# Patient Record
Sex: Male | Born: 1955 | Race: White | Hispanic: No | Marital: Married | Smoking: Former smoker
Health system: Southern US, Community
[De-identification: ages and names within clinical notes are randomized; demographics above are authoritative.]

## PROBLEM LIST (undated history)

## (undated) DIAGNOSIS — F32A Depression, unspecified: Secondary | ICD-10-CM

## (undated) DIAGNOSIS — G8929 Other chronic pain: Secondary | ICD-10-CM

## (undated) DIAGNOSIS — J301 Allergic rhinitis due to pollen: Secondary | ICD-10-CM

## (undated) DIAGNOSIS — F329 Major depressive disorder, single episode, unspecified: Secondary | ICD-10-CM

## (undated) DIAGNOSIS — K219 Gastro-esophageal reflux disease without esophagitis: Secondary | ICD-10-CM

## (undated) DIAGNOSIS — E785 Hyperlipidemia, unspecified: Secondary | ICD-10-CM

## (undated) DIAGNOSIS — M549 Dorsalgia, unspecified: Secondary | ICD-10-CM

## (undated) DIAGNOSIS — E119 Type 2 diabetes mellitus without complications: Secondary | ICD-10-CM

## (undated) DIAGNOSIS — I10 Essential (primary) hypertension: Secondary | ICD-10-CM

## (undated) DIAGNOSIS — I219 Acute myocardial infarction, unspecified: Secondary | ICD-10-CM

## (undated) DIAGNOSIS — I2510 Atherosclerotic heart disease of native coronary artery without angina pectoris: Secondary | ICD-10-CM

## (undated) DIAGNOSIS — M5442 Lumbago with sciatica, left side: Secondary | ICD-10-CM

## (undated) DIAGNOSIS — F411 Generalized anxiety disorder: Secondary | ICD-10-CM

## (undated) DIAGNOSIS — M5441 Lumbago with sciatica, right side: Secondary | ICD-10-CM

## (undated) DIAGNOSIS — J3089 Other allergic rhinitis: Principal | ICD-10-CM

## (undated) DIAGNOSIS — R350 Frequency of micturition: Secondary | ICD-10-CM

## (undated) DIAGNOSIS — N401 Enlarged prostate with lower urinary tract symptoms: Secondary | ICD-10-CM

## (undated) DIAGNOSIS — I25118 Atherosclerotic heart disease of native coronary artery with other forms of angina pectoris: Secondary | ICD-10-CM

## (undated) DIAGNOSIS — J302 Other seasonal allergic rhinitis: Secondary | ICD-10-CM

## (undated) DIAGNOSIS — I25110 Atherosclerotic heart disease of native coronary artery with unstable angina pectoris: Principal | ICD-10-CM

## (undated) DIAGNOSIS — R062 Wheezing: Secondary | ICD-10-CM

## (undated) DIAGNOSIS — R972 Elevated prostate specific antigen [PSA]: Secondary | ICD-10-CM

## (undated) DIAGNOSIS — R110 Nausea: Secondary | ICD-10-CM

## (undated) DIAGNOSIS — Z419 Encounter for procedure for purposes other than remedying health state, unspecified: Secondary | ICD-10-CM

## (undated) DIAGNOSIS — L309 Dermatitis, unspecified: Secondary | ICD-10-CM

## (undated) DIAGNOSIS — Z1211 Encounter for screening for malignant neoplasm of colon: Secondary | ICD-10-CM

## (undated) DIAGNOSIS — I25708 Atherosclerosis of coronary artery bypass graft(s), unspecified, with other forms of angina pectoris: Secondary | ICD-10-CM

## (undated) DIAGNOSIS — Z860101 Personal history of adenomatous and serrated colon polyps: Secondary | ICD-10-CM

## (undated) DIAGNOSIS — M545 Low back pain, unspecified: Secondary | ICD-10-CM

## (undated) DIAGNOSIS — J069 Acute upper respiratory infection, unspecified: Principal | ICD-10-CM

## (undated) DIAGNOSIS — Z20822 Contact with and (suspected) exposure to covid-19: Secondary | ICD-10-CM

## (undated) HISTORY — DX: Hyperlipidemia, unspecified: E78.5

## (undated) HISTORY — DX: Acute myocardial infarction, unspecified: I21.9

## (undated) HISTORY — PX: OTHER SURGICAL HISTORY: SHX169

## (undated) HISTORY — DX: Major depressive disorder, single episode, unspecified: F32.9

## (undated) HISTORY — DX: Essential (primary) hypertension: I10

## (undated) HISTORY — DX: Atherosclerotic heart disease of native coronary artery without angina pectoris: I25.10

## (undated) HISTORY — DX: Type 2 diabetes mellitus without complications: E11.9

## (undated) HISTORY — DX: Gastro-esophageal reflux disease without esophagitis: K21.9

## (undated) HISTORY — DX: Dorsalgia, unspecified: M54.9

## (undated) HISTORY — DX: Depression, unspecified: F32.A

## (undated) HISTORY — DX: Allergic rhinitis due to pollen: J30.1

## (undated) HISTORY — DX: Other chronic pain: G89.29

---

## 2001-04-09 ENCOUNTER — Encounter: Payer: Self-pay | Admitting: Emergency Medicine

## 2001-04-09 ENCOUNTER — Inpatient Hospital Stay (HOSPITAL_COMMUNITY): Admission: EM | Admit: 2001-04-09 | Discharge: 2001-04-11 | Payer: Self-pay | Admitting: *Deleted

## 2002-02-15 ENCOUNTER — Ambulatory Visit (HOSPITAL_COMMUNITY): Admission: RE | Admit: 2002-02-15 | Discharge: 2002-02-15 | Payer: Self-pay | Admitting: Cardiology

## 2002-03-17 ENCOUNTER — Inpatient Hospital Stay (HOSPITAL_COMMUNITY): Admission: EM | Admit: 2002-03-17 | Discharge: 2002-03-26 | Payer: Self-pay | Admitting: Emergency Medicine

## 2002-03-17 ENCOUNTER — Encounter: Payer: Self-pay | Admitting: Emergency Medicine

## 2002-03-21 ENCOUNTER — Encounter: Payer: Self-pay | Admitting: Cardiology

## 2002-03-22 ENCOUNTER — Encounter: Payer: Self-pay | Admitting: Cardiothoracic Surgery

## 2002-03-22 HISTORY — PX: CORONARY ARTERY BYPASS GRAFT: SHX141

## 2002-03-23 ENCOUNTER — Encounter: Payer: Self-pay | Admitting: Cardiothoracic Surgery

## 2002-03-24 ENCOUNTER — Encounter: Payer: Self-pay | Admitting: Thoracic Surgery (Cardiothoracic Vascular Surgery)

## 2002-03-25 ENCOUNTER — Encounter: Payer: Self-pay | Admitting: Thoracic Surgery (Cardiothoracic Vascular Surgery)

## 2002-04-22 ENCOUNTER — Encounter (HOSPITAL_COMMUNITY): Admission: RE | Admit: 2002-04-22 | Discharge: 2002-07-21 | Payer: Self-pay | Admitting: Cardiology

## 2004-08-10 ENCOUNTER — Ambulatory Visit: Payer: Self-pay | Admitting: General Practice

## 2004-12-21 ENCOUNTER — Encounter (INDEPENDENT_AMBULATORY_CARE_PROVIDER_SITE_OTHER): Payer: Self-pay | Admitting: *Deleted

## 2004-12-21 ENCOUNTER — Ambulatory Visit (HOSPITAL_BASED_OUTPATIENT_CLINIC_OR_DEPARTMENT_OTHER): Admission: RE | Admit: 2004-12-21 | Discharge: 2004-12-21 | Payer: Self-pay | Admitting: Surgery

## 2004-12-21 ENCOUNTER — Ambulatory Visit (HOSPITAL_COMMUNITY): Admission: RE | Admit: 2004-12-21 | Discharge: 2004-12-21 | Payer: Self-pay | Admitting: Surgery

## 2004-12-21 HISTORY — PX: OTHER SURGICAL HISTORY: SHX169

## 2005-02-22 ENCOUNTER — Ambulatory Visit (HOSPITAL_BASED_OUTPATIENT_CLINIC_OR_DEPARTMENT_OTHER): Admission: RE | Admit: 2005-02-22 | Discharge: 2005-02-22 | Payer: Self-pay | Admitting: Surgery

## 2005-02-22 ENCOUNTER — Encounter (INDEPENDENT_AMBULATORY_CARE_PROVIDER_SITE_OTHER): Payer: Self-pay | Admitting: Specialist

## 2005-02-22 HISTORY — PX: OTHER SURGICAL HISTORY: SHX169

## 2006-03-13 ENCOUNTER — Ambulatory Visit: Payer: Self-pay | Admitting: Cardiovascular Disease

## 2006-03-15 ENCOUNTER — Ambulatory Visit: Payer: Self-pay

## 2006-03-16 ENCOUNTER — Ambulatory Visit: Payer: Self-pay | Admitting: Cardiovascular Disease

## 2006-03-29 ENCOUNTER — Ambulatory Visit: Payer: Self-pay | Admitting: Cardiovascular Disease

## 2006-03-29 ENCOUNTER — Inpatient Hospital Stay (HOSPITAL_COMMUNITY): Admission: AD | Admit: 2006-03-29 | Discharge: 2006-03-31 | Payer: Self-pay | Admitting: Cardiovascular Disease

## 2006-03-29 ENCOUNTER — Inpatient Hospital Stay (HOSPITAL_BASED_OUTPATIENT_CLINIC_OR_DEPARTMENT_OTHER): Admission: RE | Admit: 2006-03-29 | Discharge: 2006-03-29 | Payer: Self-pay | Admitting: Cardiovascular Disease

## 2006-04-06 ENCOUNTER — Ambulatory Visit: Payer: Self-pay | Admitting: Internal Medicine

## 2006-04-13 ENCOUNTER — Ambulatory Visit: Payer: Self-pay | Admitting: Cardiovascular Disease

## 2006-07-03 ENCOUNTER — Ambulatory Visit: Payer: Self-pay | Admitting: Cardiovascular Disease

## 2006-09-15 ENCOUNTER — Ambulatory Visit: Payer: Self-pay | Admitting: Cardiovascular Disease

## 2006-09-15 ENCOUNTER — Ambulatory Visit: Payer: Self-pay

## 2006-09-18 ENCOUNTER — Encounter: Admission: RE | Admit: 2006-09-18 | Discharge: 2006-09-18 | Payer: Self-pay | Admitting: Family Medicine

## 2006-09-26 ENCOUNTER — Encounter: Admission: RE | Admit: 2006-09-26 | Discharge: 2006-09-26 | Payer: Self-pay | Admitting: Family Medicine

## 2006-11-28 ENCOUNTER — Ambulatory Visit: Payer: Self-pay | Admitting: Cardiovascular Disease

## 2007-02-07 ENCOUNTER — Ambulatory Visit: Payer: Self-pay | Admitting: Cardiology

## 2007-02-07 LAB — CONVERTED CEMR LAB
Basophils Relative: 0.2 % (ref 0.0–1.0)
CO2: 30 meq/L (ref 19–32)
Creatinine, Ser: 1 mg/dL (ref 0.4–1.5)
Glucose, Bld: 132 mg/dL — ABNORMAL HIGH (ref 70–99)
HCT: 48.4 % (ref 39.0–52.0)
Hemoglobin: 16.7 g/dL (ref 13.0–17.0)
INR: 0.9 (ref 0.8–1.0)
MCHC: 34.6 g/dL (ref 30.0–36.0)
Monocytes Absolute: 0.5 10*3/uL (ref 0.2–0.7)
Neutrophils Relative %: 54.2 % (ref 43.0–77.0)
Potassium: 4.1 meq/L (ref 3.5–5.1)
Prothrombin Time: 11.7 s (ref 10.9–13.3)
RDW: 12.5 % (ref 11.5–14.6)
Sodium: 140 meq/L (ref 135–145)

## 2007-02-14 ENCOUNTER — Inpatient Hospital Stay (HOSPITAL_BASED_OUTPATIENT_CLINIC_OR_DEPARTMENT_OTHER): Admission: RE | Admit: 2007-02-14 | Discharge: 2007-02-14 | Payer: Self-pay | Admitting: Cardiology

## 2007-02-14 ENCOUNTER — Ambulatory Visit: Payer: Self-pay | Admitting: Cardiology

## 2007-02-23 ENCOUNTER — Ambulatory Visit: Payer: Self-pay

## 2007-02-27 ENCOUNTER — Ambulatory Visit: Payer: Self-pay | Admitting: Cardiovascular Disease

## 2007-06-02 ENCOUNTER — Encounter: Payer: Self-pay | Admitting: *Deleted

## 2007-06-02 DIAGNOSIS — I252 Old myocardial infarction: Secondary | ICD-10-CM

## 2007-06-02 DIAGNOSIS — Z9861 Coronary angioplasty status: Secondary | ICD-10-CM | POA: Insufficient documentation

## 2007-06-02 DIAGNOSIS — E785 Hyperlipidemia, unspecified: Secondary | ICD-10-CM | POA: Insufficient documentation

## 2007-06-02 DIAGNOSIS — J301 Allergic rhinitis due to pollen: Secondary | ICD-10-CM

## 2007-06-02 DIAGNOSIS — Z951 Presence of aortocoronary bypass graft: Secondary | ICD-10-CM

## 2007-06-02 DIAGNOSIS — M549 Dorsalgia, unspecified: Secondary | ICD-10-CM | POA: Insufficient documentation

## 2007-06-02 DIAGNOSIS — I10 Essential (primary) hypertension: Secondary | ICD-10-CM | POA: Insufficient documentation

## 2007-06-02 DIAGNOSIS — I2510 Atherosclerotic heart disease of native coronary artery without angina pectoris: Secondary | ICD-10-CM | POA: Insufficient documentation

## 2007-06-02 DIAGNOSIS — E119 Type 2 diabetes mellitus without complications: Secondary | ICD-10-CM | POA: Insufficient documentation

## 2007-06-02 DIAGNOSIS — K219 Gastro-esophageal reflux disease without esophagitis: Secondary | ICD-10-CM | POA: Insufficient documentation

## 2007-06-02 DIAGNOSIS — F329 Major depressive disorder, single episode, unspecified: Secondary | ICD-10-CM

## 2007-09-19 ENCOUNTER — Ambulatory Visit: Payer: Self-pay | Admitting: Cardiovascular Disease

## 2008-03-27 ENCOUNTER — Encounter: Payer: Self-pay | Admitting: Cardiovascular Disease

## 2008-03-27 ENCOUNTER — Ambulatory Visit: Payer: Self-pay | Admitting: Cardiovascular Disease

## 2008-07-25 ENCOUNTER — Ambulatory Visit: Payer: Self-pay | Admitting: Cardiovascular Disease

## 2008-10-11 ENCOUNTER — Inpatient Hospital Stay (HOSPITAL_COMMUNITY): Admission: EM | Admit: 2008-10-11 | Discharge: 2008-10-12 | Payer: Self-pay | Admitting: Emergency Medicine

## 2008-10-21 ENCOUNTER — Ambulatory Visit (HOSPITAL_COMMUNITY): Admission: RE | Admit: 2008-10-21 | Discharge: 2008-10-21 | Payer: Self-pay | Admitting: Orthopedic Surgery

## 2008-11-29 ENCOUNTER — Emergency Department (HOSPITAL_COMMUNITY): Admission: EM | Admit: 2008-11-29 | Discharge: 2008-11-30 | Payer: Self-pay | Admitting: Emergency Medicine

## 2008-11-29 ENCOUNTER — Encounter: Payer: Self-pay | Admitting: Cardiovascular Disease

## 2008-12-05 ENCOUNTER — Encounter (INDEPENDENT_AMBULATORY_CARE_PROVIDER_SITE_OTHER): Payer: Self-pay | Admitting: *Deleted

## 2009-05-21 ENCOUNTER — Ambulatory Visit: Payer: Self-pay | Admitting: Cardiovascular Disease

## 2009-12-02 ENCOUNTER — Encounter: Payer: Self-pay | Admitting: Cardiovascular Disease

## 2009-12-02 ENCOUNTER — Ambulatory Visit: Payer: Self-pay | Admitting: Cardiovascular Disease

## 2010-02-15 ENCOUNTER — Encounter: Payer: Self-pay | Admitting: Family Medicine

## 2010-02-23 NOTE — Assessment & Plan Note (Signed)
Summary: 6 month rov/sl   CC:  sob.  History of Present Illness: [name] is seen today in F/U for his CAD.  had coronary bypass surgery in 2004.  In March of 2008 catheterization revealed occlusion of all of his vein grafts with a patent LIMA to the LAD.  At that time he had stenting of the mid to distal native right coronary artery with a Taxus stent.  He had a followup catheter in January of 2009 as part of the Peruses trial bulimic continue to be patent as did the stent in the RCA with no obstructive disease.  His overall LV function is still normal.  He is now on Social Security disability.  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  I did not give him disability but he appears to have had a period he is enjoying his family time including playing with his grandchildren and nephews.  He even has taken  his motorcycle out for a ride or 2.  He is no llonger complaining of chest pain exertional dyspnea PND orthopnea palpitations or syncope.  He's been compliant with his medications.    He has one son in [place] that is not getting along with his mother.  Tyjai was to move to [place] to be with his son Mabel but may have to stay in [place] with Josh  Yahya's stress level is much reduced since his disability came through  Current Problems (verified): 1)  Percutaneous Transluminal Coronary Angioplasty, Hx of  (ICD-V45.82) 2)  Myocardial Infarction, Hx of  (ICD-412) 3)  Back Pain, Chronic  (ICD-724.5) 4)  Coronary Artery Bypass Graft, Hx of  (ICD-V45.81) 5)  Hyperlipidemia  (ICD-272.4) 6)  Hypertension  (ICD-401.9) 7)  Coronary Artery Disease  (ICD-414.00) 8)  Hay Fever  (ICD-477.0) 9)  Gerd  (ICD-530.81) 10)  Diabetes Mellitus  (ICD-250.00) 11)  Hx of Depression  (ICD-311)  Current Medications (verified): 1)  Aspirin 325 Mg  Tabs (Aspirin) .... Take Once Daily 2)  Tricor 145 Mg  Tabs (Fenofibrate) .... Take Once Daily 3)  Rhinocort Aqua 32 Mcg/act   Susp (Budesonide) .... Use Daily As Directed. 4)  Plavix 75 Mg  Tabs (Clopidogrel Bisulfate) .... Take Once Daily 5)  Metformin Hcl 500 Mg  Tabs (Metformin Hcl) .... 3 Tabs By Mouth Morning 6)  Vytorin 10-40 Mg  Tabs (Ezetimibe-Simvastatin) .... Take Once Daily 7)  Metoprolol Succinate 200 Mg Xr24h-Tab (Metoprolol Succinate) .... Take One Tablet By Mouth Daily 8)  Nexium 40 Mg  Cpdr (Esomeprazole Magnesium) .... Take Once Daily 9)  Clonazepam 1 Mg  Tabs (Clonazepam) .... Take 11/2 As Needed 10)  Nitroquick 0.4 Mg  Subl (Nitroglycerin) .... Take Sl For Chest Pain As Needed 11)  Percocet 5-325 Mg Tabs (Oxycodone-Acetaminophen) .[name].. 1 To 1 1/2 Tab By Mouth Three Times A Day 12)  Eye Drops .... As Directed  Allergies (verified): No Known Drug Allergies  Past History:  Past Medical History: Last updated: 06/02/2007 MYOCARDIAL INFARCTION, HX OF (ICD-412) BACK PAIN, CHRONIC (ICD-724.5) HYPERLIPIDEMIA (ICD-272.4) HYPERTENSION (ICD-401.9) CORONARY ARTERY DISEASE (ICD-414.00) HAY FEVER (ICD-477.0) GERD (ICD-530.81) DIABETES MELLITUS (ICD-250.00) Hx of DEPRESSION (ICD-311)    Past Surgical History: Last updated: 12/03/2007 CABG:  03/22/2002  Tyrone Sage PCI/Stent:  Juanda Chance 03/30/2006 to RCA all vein grafts occluded LIMA patent Lipoma Shoulder Skin Tag Neck   :   Tsuie  12/21/2004 Lipoma Arms  02/22/2005  Family History: Last updated: 12/03/2007 non-contributory  Social History: Last updated: 12/03/2007 Married with 4 chiildren Originally from [place] Former smoker Drinks Under a lot of stress in marriage Disabled Corporate investment banker  Review of Systems       Denies fever, malais, weight loss, blurry vision, decreased visual acuity, cough, sputum, SOB, hemoptysis, pleuritic pain, palpitaitons, heartburn, abdominal pain, melena, lower extremity edema, claudication, or rash.   Vital Signs:  Patient profile:   55 year old male Height:      87 inches Weight:      219  pounds BMI:     20.42 Pulse rate:   66 / minute BP sitting:   118 / 70  (left arm)  Vitals Entered By: Kem Parkinson (December 02, 2009 4:20 PM)  Physical Exam  General:  Affect appropriate Healthy:  appears stated age HEENT: normal Neck supple with no adenopathy JVP normal no bruits no thyromegaly Lungs clear with no wheezing and good diaphragmatic motion Heart:  S1/S2 no murmur,rub, gallop or click PMI normal Abdomen: benighn, BS positve, no tenderness, no AAA no bruit.  No HSM or HJR Distal pulses intact with no bruits No edema Neuro non-focal Skin warm and dry    Impression & Recommendations:  Problem # 1:  CORONARY ARTERY BYPASS GRAFT, HX OF (ICD-V45.81) Songle LIMA graft remaining with collaterals.  No angina Continue medical Rx.  Normal EF  Problem # 2:  HYPERLIPIDEMIA (ICD-272.4) At goal with no side effects.  Labs in 6 months His updated medication list for this problem includes:    Tricor 145 Mg Tabs (Fenofibrate) .[name]... Take once daily    Vytorin 10-40 Mg Tabs (Ezetimibe-simvastatin) .[name]... Take once daily  CHOL (goal): 200 (07/25/2008)   LDL (goal): 70 (07/25/2008)   HDL (goal): 40 (07/25/2008)   TG (goal): 150 (07/25/2008)  Problem # 3:  HYPERTENSION (ICD-401.9) Well controlled His updated medication list for this problem includes:    Aspirin 325 Mg Tabs (Aspirin) .[name]... Take once daily    Metoprolol Succinate 200 Mg Xr24h-tab (Metoprolol succinate) .[name]... Take one tablet by mouth daily  Other Orders: EKG w/ Interpretation (93000)  Patient Instructions: 1)  Your physician recommends that you schedule a follow-up appointment in: 6 MONTHS
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
## 2010-04-28 LAB — DIFFERENTIAL
Basophils Relative: 1 % (ref 0–1)
Monocytes Absolute: 0.5 10*3/uL (ref 0.1–1.0)
Monocytes Relative: 8 % (ref 3–12)
Neutro Abs: 4.4 10*3/uL (ref 1.7–7.7)

## 2010-04-28 LAB — POCT CARDIAC MARKERS
CKMB, poc: 1.2 ng/mL (ref 1.0–8.0)
CKMB, poc: 1.4 ng/mL (ref 1.0–8.0)
Myoglobin, poc: 72.1 ng/mL (ref 12–200)

## 2010-04-28 LAB — CBC
Hemoglobin: 13.9 g/dL (ref 13.0–17.0)
MCHC: 34.5 g/dL (ref 30.0–36.0)
MCV: 89.5 fL (ref 78.0–100.0)
RBC: 4.5 MIL/uL (ref 4.22–5.81)

## 2010-04-28 LAB — BASIC METABOLIC PANEL
CO2: 26 mEq/L (ref 19–32)
Calcium: 8.6 mg/dL (ref 8.4–10.5)
Chloride: 102 mEq/L (ref 96–112)
GFR calc Af Amer: >60 mL/min (ref >60)
Sodium: 135 mEq/L (ref 135–145)

## 2010-04-30 LAB — PROTIME-INR
Prothrombin Time: 12.2 seconds (ref 11.6–15.2)
Prothrombin Time: 13.3 seconds (ref 11.6–15.2)

## 2010-04-30 LAB — CBC
HCT: 40.9 % (ref 39.0–52.0)
HCT: 44.4 % (ref 39.0–52.0)
Hemoglobin: 14.1 g/dL (ref 13.0–17.0)
Hemoglobin: 15.2 g/dL (ref 13.0–17.0)
MCV: 93.1 fL (ref 78.0–100.0)
RBC: 4.39 MIL/uL (ref 4.22–5.81)
RBC: 4.73 MIL/uL (ref 4.22–5.81)
RDW: 12.3 % (ref 11.5–15.5)
WBC: 6.2 10*3/uL (ref 4.0–10.5)
WBC: 7.6 10*3/uL (ref 4.0–10.5)
WBC: 7.6 10*3/uL (ref 4.0–10.5)

## 2010-04-30 LAB — LACTIC ACID, PLASMA: Lactic Acid, Venous: 4.2 mmol/L — ABNORMAL HIGH (ref 0.5–2.2)

## 2010-04-30 LAB — DIFFERENTIAL
Basophils Absolute: 0 10*3/uL (ref 0.0–0.1)
Eosinophils Relative: 2 % (ref 0–5)
Lymphocytes Relative: 21 % (ref 12–46)
Lymphs Abs: 1.6 10*3/uL (ref 0.7–4.0)
Monocytes Absolute: 0.5 10*3/uL (ref 0.1–1.0)
Neutro Abs: 5.3 10*3/uL (ref 1.7–7.7)

## 2010-04-30 LAB — URINALYSIS, ROUTINE W REFLEX MICROSCOPIC
Bilirubin Urine: NEGATIVE
Glucose, UA: NEGATIVE mg/dL
Ketones, ur: NEGATIVE mg/dL
Nitrite: NEGATIVE
Nitrite: NEGATIVE
Protein, ur: NEGATIVE mg/dL
Protein, ur: NEGATIVE mg/dL
Specific Gravity, Urine: 1.012 (ref 1.005–1.030)
Urobilinogen, UA: 0.2 mg/dL (ref 0.0–1.0)
pH: 6 (ref 5.0–8.0)

## 2010-04-30 LAB — TYPE AND SCREEN: Antibody Screen: NEGATIVE

## 2010-04-30 LAB — BASIC METABOLIC PANEL
BUN: 9 mg/dL (ref 6–23)
Calcium: 9.7 mg/dL (ref 8.4–10.5)
Chloride: 105 mEq/L (ref 96–112)
GFR calc Af Amer: >60 mL/min (ref >60)
GFR calc non Af Amer: >60 mL/min (ref >60)
GFR calc non Af Amer: >60 mL/min (ref >60)
Potassium: 3.9 mEq/L (ref 3.5–5.1)
Potassium: 4.2 mEq/L (ref 3.5–5.1)
Sodium: 137 mEq/L (ref 135–145)
Sodium: 137 mEq/L (ref 135–145)

## 2010-04-30 LAB — GLUCOSE, CAPILLARY
Glucose-Capillary: 123 mg/dL — ABNORMAL HIGH (ref 70–99)
Glucose-Capillary: 127 mg/dL — ABNORMAL HIGH (ref 70–99)
Glucose-Capillary: 161 mg/dL — ABNORMAL HIGH (ref 70–99)

## 2010-04-30 LAB — POCT CARDIAC MARKERS: Myoglobin, poc: 244 ng/mL (ref 12–200)

## 2010-04-30 LAB — POCT I-STAT, CHEM 8
Hemoglobin: 16 g/dL (ref 13.0–17.0)
Sodium: 138 mEq/L (ref 135–145)
TCO2: 21 mmol/L (ref 0–100)

## 2010-04-30 LAB — ABO/RH: ABO/RH(D): B POS

## 2010-04-30 LAB — APTT
aPTT: 21 seconds — ABNORMAL LOW (ref 24–37)
aPTT: 22 seconds — ABNORMAL LOW (ref 24–37)

## 2010-04-30 LAB — RAPID URINE DRUG SCREEN, HOSP PERFORMED
Amphetamines: NOT DETECTED
Benzodiazepines: NOT DETECTED
Opiates: NOT DETECTED
Tetrahydrocannabinol: NOT DETECTED

## 2010-06-08 NOTE — Assessment & Plan Note (Signed)
[facility]                            CARDIOLOGY OFFICE NOTE   Donald, Howard                        MRN:          [id number]  DATE:09/19/2007                            DOB:          10/04/1955    [name] returns today for followup.   He has significant coronary artery disease with previous CABG.  He has  chronic chest pain syndrome.  His last cath was done on February 14, 2007.  At that time, the LIMA to the LAD was patent, he had 50%  narrowing in the distal LAD, vein graft to the diagonal branch was  occluded, vein graft to the circ was occluded, and the vein graft to the  right coronary artery was occluded.  He has a Taxus stent in the native  right coronary artery and the circumflex coronary artery only had 40%  ostial and mid vessel disease.   His LV function is normal.   He was initially enrolled in the PERSEUS study and had his followup cath  in January.   Dr. Juanda Chance felt his disease was nonobstructed and stable at this point.   In talking to [name], he continues to have issues with his mood.  He had  seen a psychologist previously for impulsive rates type disorder and  depression.  He is currently on Prozac.   He gets intermittent pain.  He is taken nitro maybe twice in the last  month.   Pain is a bit atypical.  He has not had any resting pain.  He is able to  ambulate.  He has actually lost about 8 pounds.  He is no longer  smoking.   He gets occasional dyspnea and cough, but no sputum production and no  fever.   He and his wife seem to be getting along okay.  He had to go to [place]  recently as his son had appendicitis.   Otherwise, he is not working and is on disability for his back and mood  disorder.   ALLERGIES:  He has no known allergies.   CURRENT MEDICATIONS:  1. Aspirin a day.  2. Tricor 145 a day.  3. Rhinocort.  4. Plavix 75 a day.  5. Metformin 500 b.i.d.  6. Vytorin 10/40.  7. Metoprolol 50 b.i.d.  8.  Nexium.   PHYSICAL EXAMINATION:  GENERAL:  Remarkable for a overweight Jovial [place] in no distress.  VITAL SIGNS:  His weight is down; however, from 224 to 216, blood  pressure 140/80, pulse 67 and regular, respiratory rate 14, afebrile.  HEENT:  Unremarkable.  NECK:  Carotids normal without bruit.  No lymphadenopathy, thyromegaly,  or JVP elevation.  LUNGS:  Clear with good diaphragmatic motion.  No wheezing.  HEART:  S1 and S2.  Normal heart sounds.  PMI normal.  ABDOMEN:  Benign.  Bowel sounds positive.  No AAA.  No tenderness.  No  bruit.  No hepatosplenomegaly or hepatojugular reflux.  EXTREMITIES:  Distal pulses intact.  No edema.  NEURO:  Nonfocal.  SKIN:  Warm and dry.  MUSCULOSKELETAL:  No muscular weakness.  EKG is essentially normal with an incomplete right bundle branch block.   IMPRESSION:  1. Coronary artery disease.  Coronary artery bypass graft with vein      grafts occluded, nonobstructive disease by cath in January 2009.      Continue aspirin and beta-blocker, as well as Plavix since he has      stent in the native right.  2. Hypertension currently well controlled.  Continue current dose of      metoprolol, will likely need angiotensin-converting enzyme      inhibitor added in the future.  3. History of reflux.  Continue Nexium in attempts at weight loss.  4. Hyperlipidemia, in the setting of coronary artery disease and      failed grafts.  Continue Tricor and Vytorin.  Lipid and liver      profile in 6 months.  5. Anxiety and mood disorder.  Follow up with psychologist as needed.      Continue Prozac.   I will see him back in 6 months.     [name], MD, [facility]  Electronically Signed    PCN/MedQ  DD: 09/19/2007  DT: 09/19/2007  Job #: [id number]

## 2010-06-08 NOTE — Assessment & Plan Note (Signed)
[facility]                            CARDIOLOGY OFFICE NOTE   TOR, TSUDA                        MRN:          [id number]  DATE:09/15/2006                            DOB:          12-27-1955    [name] returns today for followup. He has missed a few appointments. He  continues to have problems with his ex-wife in [place]. Today, he is in  quite a bit of pain. He seemed to imply that he had a lipoma on the left  hip area that was causing this however. I suspect that he has more of an  intrinsic back problem. He has had severe pain over the last 2-3 weeks.  He apparently saw a doctor in [place] who scheduled him to have an MRI  of his back. The patient has had multiple lipomas in the past. They have  been excised from his back and from his right upper arm. He has a lipoma  in the lower hip area, but I assured him that this was not the cause of  pain. He says he missed his last appointment because he had significant  gout. He took colchicine and had some diarrhea with it. The gout lasted  about three weeks and is now back and then resolved spontaneously. He is  not on allopurinol. The patient has significant coronary disease. He is  status post CABG with vein graft secluded in the patent LIMA. He had a  stent to the native distal right last year.   He had a Myoview study today which was totally normal with an EF of 66%.  However, [name] continues to have pain. He has taken nitro three times  this week. The pain is somewhat atypical. It is not exertional. It is  left-sided. It is sharp. It may be related to his recent lumbar strain.   In either case, I had a long discussion with the [name]. In the past he  has pressed me for disability and I told him that I did not think he was  a candidate since he has normal LV function and a nonischemic Myoview.  He continues to have a poor social situation.   In regards to his back, he does not recall any  strain. However, he  clearly has limited motion with flexion and extension and is walking  with a bit of a limp. There is no obvious sciatic type symptoms. He has  been having Vicodin prescribed to him by his primary MD and this has had  limited success. I suspect that [name] has some chronic problems in  regards to codeine and narcotic use.   REVIEW OF SYSTEMS:  Is otherwise negative.   PHYSICAL EXAMINATION:  Is remarkable for a chronically ill-appearing  middle-aged white male in some distress. He has multiple tattoos. His  blood pressure is 124/88, pulse 70 and regular. He is afebrile.  Respiratory rate is 16. Weight is 225 pounds.  HEENT: Is normal. Carotids are normal without bruit. There is no  thyromegaly. No lymphadenopathy. No JVP elevation.  NECK: Supple. There is no pain  to rotation, extension or flexion.  LUNGS:  Are clear with good diaphragmatic motion.  No wheezing.  Sternum is well-healed. There is an S1, S2 with normal heart sounds. PMI  is normal.  ABDOMEN: Is protuberant. Bowel sounds are positive. No AAA. No  hepatosplenomegaly. No hepatojugular reflux. No tenderness and no  bruits.  LOWER EXTREMITIES: Distal pulses are intact.  No edema.  NEURO: Is nonfocal. There is no lower extremity weakness on muscular  examination.  SKIN: Is warm and dry.  He has some stiffness in his back with decreased flexion and extension.  He has hyperesthesia over the left hip area with a lipoma. He has  previous lipoma scar removals in the right upper arm.   His EKG at baseline shows sinus rhythm and is normal with an incomplete  right bundle branch block.   IMPRESSION:  1. Coronary artery disease, previous bypass surgery with occluded      grafts, patent LIMA by cath in March of 2008, stent to the distal      right coronary artery and nonischemic Myoview. Continued chest      pain. Medical therapy only.  2. Hypertension, currently well-controlled. Continue beta-blocker.  3.  Hyperlipidemia. Continue Vytorin 10/40. Followup lipid and liver      profile in six months.  4. Lower lumbago pain. Followup MRI. Continue Vicodin p.r.n. Again, I      doubt there is going to be a disc problem here and he may benefit      from Flexeril or another muscle relaxant.  5. History of lipomas. I do not think the one on his left hip is      serious. After his back spasms have been relieved, it may be      reasonable to have him see a dermatologist for local excision.  6. Hypertriglyceridemia. Continue Tricor 145 a day and diet therapy as      his caloric intake is too high and he eats poorly.  7. Gout. Primarily in the right toe, resolved. Check a uric acid and      see if he needs to be on maintenance allopurinol. It does not      appear that he likes colchicine as it gave him significant      diarrhea.   Overall, [name]'s heart seems to be quite stable, but he seems to have  some other medical issues that need closer followup.     [name]. Eden Emms, MD, [facility]  Electronically Signed    PCN/MedQ  DD: 09/15/2006  DT: 09/16/2006  Job #: [id number]

## 2010-06-08 NOTE — Assessment & Plan Note (Signed)
[facility]                                 ON-CALL NOTE   Donald, Howard                        MRN:          [id number]  DATE:02/12/2007                            DOB:          08-28-1955    SUPERVISING PHYSICIAN:  Rollene Rotunda, MD   PRIMARY CARDIOLOGIST:  [name], MD   HISTORY:  [name] is a 55 year old male who calls on the evening of  the 19th.  At approximately 1909  [name] states that he received a  message from the office, and asked him to call back.  However, when he  calls that number; he states that the office was closed.  He states that  there was not a message on his machine as to what the reason was why he  was supposed to call the office back.  He does elaborate that he is  scheduled for an outpatient cardiac catheterization and is supposed to  be at the hospital at [facility] tomorrow.   I explained to [name] that I did not know why the office called him  and left a message for him to call back the office.  I did look up his  labs via E-chart that were drawn on the 14th, prior to his cardiac  catheterization for tomorrow.  There did not appear to be any  abnormalities that might postpone his catheterization.  I asked him to  continue as instructed for his outpatient catheterization tomorrow.  If  I hear of anything in the interim as to why the office left a message, I  would call him back and let him know.  He was agreeable with this plan.      Joellyn Rued, PA-C  Electronically Signed      Rollene Rotunda, MD, [facility]  Electronically Signed   EW/MedQ  DD: 02/12/2007  DT: 02/13/2007  Job #: [id number]

## 2010-06-08 NOTE — Letter (Signed)
February 07, 2007    Dierdre Forth  Magistrate [facility]  [place], [place]   RE:  [name], [name]  MRN:  [id number]  /  DOB:  November 23, 1955   Dear Ms. Hickey:   I am writing in regard to [name], who is scheduled to be in your  court or via teleconference on February 14, 2007.  [name] has a long  history of coronary artery disease and has ongoing problems related to  this.  He recently had a stent placed in one of his coronary arteries as  part of the PERSEUS study and has a 32-month follow-up catheterization  scheduled for February 13, 2007.  The patient continues to have chest  pain and because of this scheduled cardiac catheterization, he will be  unable to participate in family court on the above-stated date and will  need to be rescheduled.   Thank you in advance for understanding.    Sincerely,      [name], MD, [facility]  Electronically Signed    PCN/MedQ  DD: 02/07/2007  DT: 02/07/2007  Job #: [id number]

## 2010-06-08 NOTE — Cardiovascular Report (Signed)
NAMEYOSHIMI, SARR NO.:  [id number]   MEDICAL RECORD NO.:  [id number]          PATIENT TYPE:  OIB   LOCATION:  1963                         FACILITY:  [facility]   PHYSICIAN:  Everardo Beals. Juanda Chance, MD, FACCDATE OF BIRTH:  [birth year]   DATE OF PROCEDURE:  02/14/2007  DATE OF DISCHARGE:                            CARDIAC CATHETERIZATION   CLINICAL HISTORY:  Mr. Stoiber is 55 years old and had bypass surgery in  2004.  In March of 2008, he underwent catheterization and was found to  have occlusion of all his vein grafts with a patent LIMA to LAD.  He  underwent stenting of the mid to distal right coronary artery with a  Taxus element and part of the PERUSES trial.  He returns now for a  follow-up catheterization as part of the protocol.  He has had continued  chest pain, but it has not been typical for angina.   PROCEDURE:  By the right femoral artery, an arterial sheath and 4-French  coronary catheters.  A front wall arterial puncture in the form of  Omnipaque contrast was used.  The patient tolerated the procedure well  and left the laboratory in satisfactory condition.  We used a right  saphenous vein bypass graft catheter for injection of the vein graft to  the right coronary artery.   RESULTS:  1. Left main coronary artery:  The left main coronary was free of      disease.  2. Left anterior descending coronary artery:  The left anterior      descending artery gave rise to a diagonal branch and 2 septal      perforators and then there was competing flow distally.  There is      an 8 cm are in the proximal LAD.  3. Circumflex coronary artery:  The circumflex coronary artery gave      rise to a marginal branch, an atrial branch, second marginal      branch, and 2 posterolateral branches.  There is 40% ostial      stenosis in the circumflex artery.  There is 50% narrowing in the      mid vessel.  4. Right coronary artery:  The right coronary is a moderate-sized  vessel that gave rise to a conus branch, 2 right ventricular      branches, and a small and large posterior descending branch.  There      was 70% narrowing in the mid vessel.  There was 20% narrowing      within the Taxus stent in the mid to distal vessel.  No      irregularities in the distal vessel.  5. The saphenous vein graft to the diagonal branch of LAD was      completely occluded.  6. The saphenous vein graft to the circumflex artery was completely      occluded.  7. The saphenous vein graft to the right coronary artery was      completely occluded.  8. The LIMA graft to the LAD was patent.  There is 50% narrowing in  the distal LAD.  9. Left ventriculogram:  The left ventriculogram was performed in the      RAO projection and showed good wall motion with no areas of      hypokinesis.  The estimated ejection fraction was 60%.   The aortic pressure was 110/81 with a mean of 94 and left ventricular  pressure was 110/80.   CONCLUSION:  1. Follow-up catheterization as part of the PERUSES study.  2. Coronary artery disease, status post coronary bypass graft surgery      in 2004.  3. Severe native vessel disease with 8 cm in the proximal left      anterior descending coronary artery, 40% ostial and 50% mid      stenosis in the circumflex artery, and 70% mid stenosis in the      right coronary with 20% focal narrowing within the stent in the mid      to distal right coronary artery.  4. Patent left internal mammary artery graft to the left anterior      descending coronary artery with 50% narrowing in the left anterior      descending coronary artery distal to the insertion site, occluded      vein graft to the diagonal branch of the left anterior descending      coronary artery, occluded vein graft to the circumflex artery, and      occluded vein graft to the right coronary artery.  5. Normal left ventricular function.   RECOMMENDATIONS:  Although the patient has 3  occluded vein grafts, his  disease is nonobstructive at this time.  The Taxus stent in the mid to  distal right coronary artery is patent with 20% focal narrowing.  His  native circulation has only nonobstructive disease in and his left  internal mammary artery graft to the left anterior descending coronary  artery is working well.  Will plan continued secondary risk factor  modification and follow-up with Dr. Eden Emms in a few weeks.      Bruce Elvera Lennox Juanda Chance, MD, [facility]  Electronically Signed     BRB/MEDQ  D:  02/14/2007  T:  02/14/2007  Job:  [id number]   cc:   [name], MD, [facility]  [facility]

## 2010-06-08 NOTE — Assessment & Plan Note (Signed)
[facility]                            CARDIOLOGY OFFICE NOTE   ROC, STREETT                        MRN:          [id number]  DATE:11/28/2006                            DOB:          07-Feb-1955    The patient returns today for follow-up.  He has had previous bypass in  2004 with failed vein grafts and patent LIMA.  He has had a stent to the  native right this year.  He has had occasional chest pain.  He took  nitroglycerin about two weeks ago.  It was fairly self-limited.  His  activity is more limited by chronic back problems.  Apparently, he has a  bulging disk at L4-5.  He was being seen by an orthopedic doctor.  They  do not want to give him injections due to his Plavix.  I explained to  the patient that I did not want his platelets stopped without anyone  talking to [place] first.  His risk factors are being well modified.  He is  compliant with his medicines.  In general, he has been doing fairly well  and does not need any further diagnostic testing.   REVIEW OF SYSTEMS:  His gout has improved.  Again, his biggest problem  is his lower back pain.  This is keeping him from working.  He primarily  sits around the house all day and occasionally walks his dog, Brooke Dare.  Review of systems is otherwise negative.   MEDICATIONS:  1. Aspirin a day.  2. Plavix 75 mg a day.  3. Clonazepam 1.5 mg p.r.n.  4. TriCor 145 mg a day.  5. Rhinocort.  6. Metformin 500 mg b.i.d.  7. Prilosec 20 mg a day.  8. Vytorin 10/40.  9. Metoprolol 50 mg b.i.d.   PHYSICAL EXAMINATION:  GENERAL:  An overweight white male in no  distress.  He has multiple tattoos.  VITAL SIGNS:  Blood pressure 140/80, pulse 80 and regular, weight 222,  respiratory rate 14.  HEENT:  Unremarkable.  NECK:  Supple.  No lymphadenopathy, no thyromegaly, no JVP elevation,  and no bruits.  LUNGS:  Clear with diaphragmatic motion.  No wheezing.  HEART:  S1 and S2 with distant heart sounds.   PMI is not palpable.  ABDOMEN:  Benign.  Bowel sounds are positive.  No tenderness, no  hepatosplenomegaly, no hepatojugular reflux.  No AAA and no bruits.  EXTREMITIES:  Distal pulses are intact with no edema.  NEUROLOGY:  Nonfocal.  There is no muscular weakness.  He does have  decreased extension and flexion at the back with some stiffness.   IMPRESSION:  1. Coronary artery disease, vein grafts occluded with patent left      internal mammary artery and recent stent to the right coronary      artery native.  Continue aspirin and Plavix.  2. Hypertension currently borderline controlled.  I suspect part of      this has to do with the patient's weight gain and dietary      indiscretion.  For the time being continue metoprolol 50 mg  b.i.d.      The patient is a diabetic and I suspect ought to be on an ACE      inhibitor at some point in the future to help protect his kidneys.  3. Hypercholesterolemia in the setting of coronary disease.  Continue      Vytorin as well as TriCor with low fat diet.  4. Multiple other chronic medical problems including back pain.      Follow up with orthopedics for this.  Avoid stopping Plavix if at      all possible.  I will see the patient back in the spring.  He will      need a follow-up stress test in about a year.     [name], MD, [facility]  Electronically Signed    PCN/MedQ  DD: 11/28/2006  DT: 11/28/2006  Job #: [id number]

## 2010-06-08 NOTE — Assessment & Plan Note (Signed)
[facility]                            CARDIOLOGY OFFICE NOTE   FUAD, FORGET                        MRN:          [id number]  DATE:02/27/2007                            DOB:          [birth year]    Antwone returns today for followup.  He has a previous CABG with failed  vein graft.  He has a patent LIMA to the LAD and a stent to the native  right.   He just had a repeat heart cath by Dr. Juanda Chance on the 20th, as part of  the Perseus trial.  His stent was patent.  He was well revascularized,  despite having occluded vein grafts.  He tolerated the procedure well.   The patient can use to have noncardiac chest pain.  I suggested that he  arrange with Dr. Lewie Chamber to see a pain clinic.   He does have some chronic back problems.   In regards to his heart, he is currently stable has been compliant with  his meds.  Risk factors were well modified.   REVIEW OF SYSTEMS:  Remarkable for some tenderness in arthritic-type  pain in his right hand, as well as chronic lower back pain.  Otherwise  negative.   MEDICATIONS:  Include:  1. Aspirin a day.  2. Tricor 145 a day.  3. Rhinocort.  4. Plavix 75 a day.  5. Metformin 500 b.i.d.  6. Vytorin 10/40.  7. Metoprolol 50 b.i.d.  8. Nexium 40 a day.   PHYSICAL EXAMINATION:  GENERAL:  His exam is remarkable for a healthy-  appearing middle-aged white male.  Affect is appropriate.  VITAL SIGNS:  Weight is 224, blood pressure is 140/80, pulse 80 and  regular, afebrile, respiratory rate 14.  HEENT:  Unremarkable.  Carotids are without bruit, no lymphadenopathy,  thyromegaly, JVP elevation.  LUNGS:  Clear diaphragmatic motion.  No wheezing.  S1-S2 normal heart  sounds.  PMI normal.  ABDOMEN:  Benign.  Bowel sounds positive.  No AAA, no tenderness, no  hepatosplenomegaly, no hepatojugular reflux, distal pulses intact, no  edema.  NEURO:  Nonfocal.  SKIN:  Warm and dry.  He has multiple tattoos on his arms and  neck.  Distal pulses are intact.  NEURO:  Nonfocal.  EKG shows incomplete right bundle branch block, has  been essentially normal.   IMPRESSION:  1. Previous coronary artery bypass graft with failed vein grafts,      recent cath showing fairly good revascularization despite this.      Continue aspirin and Plavix and beta blocker and follow-up Myoview      in the year.  2. Noncardiac pain.  Consider follow-up and pain clinic, rule out      systemic connective tissue disease, continue nonsteroidal.  Try to      avoid narcotics.  3. Hypertension, currently well controlled.  Continue current dose of      metoprolol.  4. Hypercholesterolemia in the setting of coronary disease.  Continue      Vytorin 1040, lipid liver profile in 6 months.  5. History of reflux.  Continue Nexium  40 a day.  Low-spice diet.      Avoid late-night meals.   Again, Bodhi continues to be under a lot of stress.  He had to miss a  family court appointment in [place], [place], due to his heart cath.  I believe he is collecting social security disability now.  I will see  him back in 6 months.     [name], MD, [facility]  Electronically Signed    PCN/MedQ  DD: 02/27/2007  DT: 02/28/2007  Job #: [id number]

## 2010-06-08 NOTE — H&P (Signed)
[facility] ADMISSION   [name], [name]                        MRN:          [id number]  DATE:02/07/2007                            DOB:          1955-04-08    The patient seen in the [facility] clinic on February 07, 2007 as part of  the small-vessel study. The patient is due for a 58-month followup  catheterization, and this is scheduled for February 13, 2007.   CHIEF COMPLAINT:  Chest pain.   HISTORY OF PRESENT ILLNESS:  [name] is a 55 year old African-American male  patient of Dr. Eden Emms who has history of coronary artery disease. He was  status post CABG in 2004, and subsequently catheterization on March 29, 2006 revealed ejection fraction of 60% with native RCA, had a 50% mid,  70-80% distal, and all of his vein grafts were occluded as follows:  Vein graft to the OM, vein graft to the first diagonal, vein graft to  the RCA. The LIMA to the LAD was patent. He underwent Taxus stenting to  the distal native RCA by Dr. Juanda Chance on March 30, 2006 and had residual  stenosis in the proximal RCA less than 50%.   Since then, the patient continues to have chest pain on a regular basis.  He uses nitroglycerin at least once a week. The chest pain is described  as a heaviness associated with shortness of breath, occurs whenever he  does any exertion such as raking the leaves or walking. Last week, it  took 3 nitroglycerin to relieve his chest pain. He is due for a 60-month  followup study on February 13, 2007 as part of the study. The patient is  scheduled to go to family court in [place], [place], on February 14, 2007, but this will have to be rescheduled as the catheterization  has to be done on this date because it is part of the study.   ALLERGIES:  No known drug allergies.   CURRENT MEDICATIONS:  1. Aspirin 325 mg daily.  2. TriCor 145 mg daily.  3. Rhinocort daily.  4. Plavix 75 mg daily.  5. Metformin 500  mg b.i.d.  6. Vytorin 10/40 mg daily.  7. Metoprolol 50 mg b.i.d.  8. Nexium 40 mg daily.   PAST MEDICAL HISTORY:  Significant for:  1. 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  2. Coronary artery disease as listed above.  3. Diabetes mellitus.  4. Hypercholesterolemia.  5. Gout.  6. History of lipomas.  7. Hypertension.   SOCIAL HISTORY:  He is married. He has four children. He quit smoking.  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   FAMILY HISTORY:  Significant for coronary artery disease.   REVIEW OF SYSTEMS:  Is negative for dizziness or presyncopal signs or  symptoms, dyspepsia, dysphagia, nausea, vomiting, change in bowels or  melena. He does have chronic back pain from his bulging disks.  CARDIOPULMONARY:  Please see HPI.   PHYSICAL EXAMINATION:  This is  a pleasant 55 year old African-American  male in no acute distress. Blood pressure 128/82, pulse 80, weight 219.  HEENT:  Head is normocephalic without sign of trauma.  Extraocular.  Movements are intact. Pupils are equal and reactive to light and  accommodation. Nasal mucosa is moist. Throat is without erythema or  educate.  NECK:  Is without JVD, HR, bruit or thyroid enlargement.  LUNGS:  Clear anterior, posterior and lateral.  HEART:  Regular rate and rhythm at 80 beats per minute. Normal S1 and  S2. No murmur, rub or atrial heave noted.  ABDOMEN:  Is soft without organomegaly, masses, lesions or abnormal  tenderness.  EXTREMITIES:  Without clubbing, cyanosis, or edema. He has good distal  pulses.  NEUROLOGICAL EXAM:  Is without focal deficit.   EKG:  Normal sinus rhythm with incomplete right bundle branch block.   IMPRESSION:  1. Coronary artery disease - part of a small-vessel study, for 34-month      followup catheterization on February 13, 2007.  2. Coronary artery disease status post coronary artery bypass grafting      in 2004; three or four  grafts occluded; LIMA to the LAD patent.  3. Status post Taxus stenting to the distal native right coronary      artery on March 30, 2006 with residual stenosis in the proximal      right coronary artery slightly less than 50%, ejection fraction      60%.  4. Hypertension.  5. Hyperlipidemia with hypertriglyceridemia.  6. Diabetes mellitus.  7. Exsmoker.  8. Family history of coronary artery disease.  9. Prior myocardial infarction, treated with percutaneous coronary      intervention in 2003.  10.Chronic back pain secondary to bulging disks.   PLAN:  At this time, we will admit this patient for repeat cardiac  catheterization on February 13, 2007 as part of the PERSEUS study, and he  will follow up with Dr. Eden Emms after that.      [name], PA-C  Electronically Signed      [name], MD, [facility]  Electronically Signed   ML/MedQ  DD: 02/07/2007  DT: 02/07/2007  Job #: [id number]

## 2010-06-08 NOTE — Assessment & Plan Note (Signed)
[facility]                            CARDIOLOGY OFFICE NOTE   [name], [name]                        MRN:          [id number]  DATE:07/03/2006                            DOB:          1955/02/05    [name] returns today for follow-up.  I believe he missed his last  appointment.  He continues to be under a lot of stress.  His ex-wife  lives in [place] and he has been in contact with his 56 year old son.  He continues to be unemployed.  [name] just always seems to be very  stressed out.   The patient is status post previous bypass.  Unfortunately his vein  grafts are all occluded.  His LIMA is patent.  He has had recent  stenting of the distal right coronary artery.   He continues to have chest pain, some of it is atypical, however, he  does take nitroglycerin for it.  I explained to [name] that I thought we  would continue to treat him medically since his interventional options  are limited.   I also talked to [name] about disability.  Currently, I do not think he  is a candidate.  He is not having incapacitating chest pain.  His native  right was stented and his current revascularization is adequate. He has  no LV dysfunction and is functional class I.   REVIEW OF SYSTEMS:  Remarkable for probable bilateral carpal tunnel  syndrome.  He needs median nerve testing.  Review of systems otherwise  negative.   MEDICATIONS:  1. Aspirin a day.  2. Nexium.  3. Clonazepam 1-1/2 p.r.n.  4. Lopressor 50 b.i.d.  5. TriCor 150 mg a day.  6. Rhinocort.  7. Plavix 75 a day.  8. Metformin 500 b.i.d.   PHYSICAL EXAMINATION:  GENERAL:  A somewhat stressed out, overweight,  middle-aged male.  Mood is agitated.  VITAL SIGNS:  Remarkable for blood pressure 154/88, pulse 60 and  regular, weight 220, respirations 16.  HEENT:  Normal.  NECK:  Supple.  There is no JVP elevation.  No thyromegaly, no bruits.  LUNGS:  Clear without wheezing and normal  diaphragmatic motion.  HEART:  There is an S1 and S2 with normal heart sounds.  PMI is normal.  He is status post sternotomy.  ABDOMEN:  Benign.  There is no epigastric pain.  Bowel sounds are  positive.  No organomegaly, no hepatosplenomegaly, no hepatojugular  reflux.  EXTREMITIES:  Femorals were +2 and deep bilaterally.  There is no  bruits.  Distal pulses intact with no edema.  NEUROLOGY:  Nonfocal.  SKIN:  Warm and dry with multiple tattoos.  There is no muscular  weakness.   IMPRESSION:  1. Coronary artery disease, previous coronary artery bypass graft with      failed vein grafts, stenting of the native right coronary artery      and patent left internal mammary artery, good left ventricular      function.  Chest pain possibly angina, we are going to continue      medical therapy.  Follow  up on Myoview probably in 6 months.  2. Hypercholesterolemia.  Continue TriCor.  I am not sure why he is      not on a statin drug.  We will have to review the records on this.  3. Gastroesophageal reflux disease.  Continue Nexium 40 mg a day.  4. Diabetes.  Follow up with primary care doctor, check hemoglobin      A1C, continue Metformin 500 b.i.d.     [name]. Donald Emms, MD, [facility]  Electronically Signed    PCN/MedQ  DD: 07/03/2006  DT: 07/03/2006  Job #: [id number]

## 2010-06-11 NOTE — Assessment & Plan Note (Signed)
[facility]                                 ON-CALL NOTE   LANGDON, CROSSON                          MRN:          [id number]  DATE:07/08/2006                            DOB:          05/26/1955    TIME OF CALL:  11am   TELEPHONE NUMBER:  [phone]   OBJECTIVE:  The patient has gout in the foot. Wanted a prescription for  gout medicine and was told to come in to be seen, so I can see what it  looks like. The patient's wife has to go to work at [employer] and the patient  said that he cannot go. If he gets worse, he will go to the emergency  room.   ASSESSMENT:  Presumed gout with foot pain.   PLAN:  To the ER if need be, otherwise come in next week if it is still  bothering him.     Arta Silence, MD  Electronically Signed    RNS/MedQ  DD: 07/08/2006  DT: 07/09/2006  Job #: [id number]

## 2010-06-11 NOTE — Assessment & Plan Note (Signed)
[facility]                           PRIMARY CARE OFFICE NOTE   CHRISS, MANNAN                        MRN:          [id number]  DATE:04/06/2006                            DOB:          1955/12/24    REFERRING PHYSICIAN:  [name], MD, [facility]   [name] is a 55 year old gentleman, who presents to establish for  ongoing continuity care, referred through the courtesy of Dr. Charlton Haws.   CHIEF COMPLAINT:  Patient with newly-diagnosed diabetes, poorly-  controlled.   PAST MEDICAL HISTORY:  SURGICAL:  1. Patient with MI in March of 2004 with cath and stent at another      location.  2. Coronary artery bypass grafting times six vessels with a vein graft      to the obtuse marginal, first diagonal, RCA with LIMA to LAD.  3. Complex stenting, March 5 of 2008, when  the patient had chest      pain, abnormal blood pressure response to stress testing, and came      to cardiac catheterization, which revealed that patient had three      out of four grafts occluded and underwent PCI with coated stenting      of the RCA.  He is in the [employer].   MEDICAL:  1. The patient has had the usual childhood diseases.  2. History of depression in the past.  3. Recently diagnosed diabetes.  4. GERD.  5. Hayfever and allergies.  6. Coronary artery disease.  7. Hypertension.  8. Hyperlipidemia.   CURRENT MEDICATIONS:  1. Plavix 75 mg daily.  2. Aspirin 325 mg daily.  3. Nexium 40 mg q.a.m.  4. Clonazepam 1.5 mg p.r.n.  5. Metoprolol 50 mg b.i.d.  6. Tricor 145 mg daily.  7. Rhinocort Aqua.  8. Metformin 500 mg b.i.d.  9. Trazodone 100 mg q.h.s. p.r.n.  10.Sublingual nitroglycerin p.r.n.  11.Colchicine p.r.n.   FAMILY HISTORY:  Positive for heart disease in his father and two  brothers.  Positive for hypertension in his brothers.  Positive for  sudden death under age 64 in one brother.  Positive for diabetes in his  two brothers and two  sisters.   SOCIAL HISTORY:  The patient has completed eleven years of high school.  He does work as Warehouse manager.  He is married.   HABITS:  The patient was a smoker, currently not using tobacco.  Patient  is a social drinker.   REVIEW OF SYSTEMS:  Patient has had no fevers or chills.  His weight has  been stable.  He is having no chest pain.  He is having no respiratory  discomfort and his heartburn is well-controlled.   VITAL SIGNS/GENERAL APPEARANCE:  This is a heavy-set gentleman in no  acute distress.  Temperature was 98.8, blood pressure 112/84, pulse 71,  weight 226.  No further exam conducted.   ASSESSMENT AND PLAN:  1. Diabetes.  Extended time discussing with patient the mechanism of      diabetes and treatment options.  Emphasized the importance of diet  and lifestyle modification, including daily exercise.  Patient      already is scheduled to meet with a dietician and diabetic      educator.  PLAN:  Patient to continue and take metformin 500 mg      b.i.d., which he has started several days ago.  We will have him      return for followup labs in four to six weeks and check a      fructosamine.  He will have a hemoglobin A1c in three months to see      if he has improved from his baseline A1c at 8.7%.  2. Coronary artery disease.  Patient is status post bypass surgery and      now with recent PCI and stenting.  He is in the [employer].  I      did review and explain with him the mechanism of the MI and also      the importance of regular exercise to develop collateral      circulation.  Also emphasized the importance of lipid control.  3. Hyperlipidemia.  Patient has been poorly-controlled.  Laboratory      from March 20, 2006, reveals an LDL cholesterol of 218,      triglycerides of 311, HDL was 29.  Patient currently is on Tricor.      I have encouraged him to begin taking Vytorin 10/40 and samples are      provided for one month.  He will return  in three weeks for      laboratory to assess his response to Vytorin and to check for side      effects.  I explained to him that the goal for his LDL is less than      80, given his known history of coronary artery disease in the      setting of high risk with diabetes and hypertension.  4. Blood pressure control.  Patient is doing very well at this time.      He will continue on his present medications.  5. Allergies.  Stable.  6. GERD.  Patient reports his symptoms are well-controlled with      Nexium, taken on a daily basis.  7. Health maintenance.  We will need to discuss with the patient the      need for colorectal cancer screening and also the need for prostate      examination.   SUMMARY:  This is a very pleasant gentleman, established for ongoing  care with problems as outlined above.  He will be returning in several  weeks for laboratory.   Face-to-face was 45 minutes with this patient with education issues as  above.     Donald Gess Norins, MD  Electronically Signed    MEN/MedQ  DD: 04/07/2006  DT: 04/08/2006  Job #: [id number]   cc:   [place] [address] [name], [address]

## 2010-06-11 NOTE — Op Note (Signed)
[name], [name]                 ACCOUNT NO.:  [id number]   MEDICAL RECORD NO.:  [id number]          PATIENT TYPE:  INP   LOCATION:  2025                         FACILITY:  [facility]   PHYSICIAN:  Everardo Beals. Juanda Chance, MD, FACCDATE OF BIRTH:  August 14, 1955   DATE OF PROCEDURE:  03/30/2006  DATE OF DISCHARGE:  03/29/2006                               OPERATIVE REPORT   PROCEDURE:  Percutaneous coronary intervention.   CLINICAL HISTORY:  [name] is 55 years old and has had previous bypass  surgery.  He recently developed symptoms of angina and was studied in  the outpatient laboratory yesterday by Dr. Eden Emms and was found to have  a tight lesion in the distal right coronary artery and was scheduled for  intervention today.  All his vein grafts were occluded, but his native  circumflex did not have any major obstructive disease and his LIMA to  the LAD was patent.  His LV function was good.   PROCEDURE:  The procedure was performed via the left femoral arteries  and arterial sheath using arterial sheath.  We used a 6-French JR-4  guiding catheter with side holes.  We were able to cross the lesion in  the distal right coronary with the wire without difficulty.  The patient  was given antiemetics bolus infusion and had previously been given a  Plavix load and aspirin.   The patient was enrolled in the Perseus trial for small vessels.  We  chose a 2.5 x 20 mm Taxus study stent and positioned this in the distal  vessel and deployed it with 1 inflation of 16 atmospheres for 30  seconds.  We then postdilated with a 3.0 x 50-mm Quantum Maverick  performing 2 inflations up to 16 atmospheres for 30 seconds.  The  patient developed TIMI II flow after the postdilatation, and we treated  this with intracoronary verapamil.  This restored TIMI III flow.  The  patient tolerated the procedure well and left the laboratory in  satisfactory condition.   CONCLUSION:  1. Successful PCI of the lesion in the  distal native right coronary      artery using a Taxus Perseus study stent with improvement of center      narrowing from 90% to 0%.  2. Residual stenosis in the proximal right coronary estimated at      slightly less than 50%.   DISPOSITION:  The patient was returned to 6500 for further observation.      Bruce Elvera Lennox Juanda Chance, MD, [facility]  Electronically Signed     BRB/MEDQ  D:  03/30/2006  T:  03/30/2006  Job:  [id number]   cc:   [name], MD, [facility]  Cardiopulmonary Lab

## 2010-06-11 NOTE — Op Note (Signed)
[name], [name] NO.:  [id number]   MEDICAL RECORD NO.:  [id number]          PATIENT TYPE:  AMB   LOCATION:  DSC                          FACILITY:  [facility]   PHYSICIAN:  Wilmon Arms. Corliss Skains, M.D. DATE OF BIRTH:  1955-09-11   DATE OF PROCEDURE:  12/21/2004  DATE OF DISCHARGE:                                 OPERATIVE REPORT   PREOPERATIVE DIAGNOSIS:  1.  Lipoma of the right shoulder.  2.  Skin tag, right neck.   POSTOPERATIVE DIAGNOSIS:  1.  Lipoma of the right shoulder.  2.  Skin tag, right neck.   PROCEDURE PERFORMED:  Excision of lipoma of right shoulder and skin tag of  the right neck.   SURGEON:  Wilmon Arms. Tsuei, M.D.   ANESTHESIA:  Local MAC.   INDICATIONS FOR PROCEDURE:  The patient is a 55 year old male who has had a  slowly enlarging mass on the anterior surface of his right shoulder for  several years.  It has become uncomfortable.  He also has a skin tag on the  right side of the neck that is bothering him.   DESCRIPTION OF PROCEDURE:  The patient was brought to the operating room and  placed in the supine position on the operating table.  His right shoulder  was prepped with Betadine and draped in a sterile fashion.  He was given  intravenous sedation.  A time out was then taken to confirm the proper  patient and proper procedure.  A longitudinal incision was made over the mid  portion of the lipoma.  This area had previously been infiltrated with a  total of 12 mL of local anesthetic.  The skin was opened with the scalpel.  Dissection was carried down to the subcutaneous tissues.  The lipoma was  encountered.  Blunt dissection was used to dissect the lipoma free from the  surrounding tissues.  Cautery was used to cauterize the small vessel leading  into the lipoma.  The lipoma was excised in its entirety and sent for  pathologic examination.  The wound was inspected and cautery was used for  hemostasis.  The wound was closed with a deep  layer of 3-0 Vicryl and  subcuticular layer of 4-0 Monocryl.  Steri-Strips and a clean dressing was  applied.  Attention was turned to the small skin tag.  This was prepped with  Betadine.  1% Xylocaine was used to anesthetize the skin.  A pair of  scissors was used to amputate the skin tag.  No bleeding was noted.  A Band-  Aid was placed.  The patient was awakened and brought to the recovery room  in stable condition.  All sponge, instrument, and needle counts were  correct.      Wilmon Arms. Tsuei, M.D.  Electronically Signed    MKT/MEDQ  D:  12/21/2004  T:  12/21/2004  Job:  [id number]

## 2010-06-11 NOTE — Assessment & Plan Note (Signed)
[facility]                            CARDIOLOGY OFFICE NOTE   [name], [name]                          MRN:          [id number]  DATE:03/13/2006                            DOB:          1955-05-05    HISTORY:  [name] is seen today as new patient.  He has previously  been seen by [name].  Apparently their insurance was dropped.  He  has had previous stents in 2001 and 2003.  He subsequently had bypass  surgery in 2004 by Dr. Tyrone Howard.   The patient has suffered an inferior-posterior wall MI.  His grafting  was done March 22, 2002.  He had a LIMA to the LAD, vein graft to the  first diagonal, triple sequential vein graft to first, second and third  obtuse marginal branches and a vein graft to the PDA.   The patient has markedly positive family history.   He is no longer smoking.  He has been compliant with his meds.  He has  had multiple complaints over the last couple of months.  He has been  getting some chest pain and the chest pain is a little bit atypical.  He  has been taking more nitro over the last 2 weeks.  There is not always  immediate relief.  He had one particularly bad episode 2 weeks ago and  went to sleep and when he woke up the pain was gone.  This pain tends to  radiate towards his shoulder, however, he has pain in both his hands  which sound more like carpal tunnel syndrome.  He has also had some  exertional dyspnea.   We will have to look back through his records in regards to what his  ejection fraction is.  The last reported one from his cath in 2003  showed an EF of 70%.   The patient has been somewhat more sedentary over the wintertime.  He  usually walks quite a bit.   His review of systems otherwise remarkable for no PND or orthopnea.  He  has had mild lower extremity edema.   The patient quit smoking 2003.  He has occasional red wine with dinner.  He has one cup of coffee a day.  He is on disability  and has a low salt  diet.  He is married.  His wife is here with him.  He is originally from  [place] but moved here with her and they live out in [place].   He has positive family history with a brother dying of a heart attack at  age 76, father dying of a heart attack at age 9.   His medications include Lopressor 50 a day, Nexium 40 a day, TriCor 145  a day, trazodone 1 gram a day, Klonopin 1.5 three times a day and an  aspirin a day as well as Rhinocort p.r.n.  THE PATIENT HAS BEEN  INTOLERANT TO STATINS IN THE PAST.  He was tried on three different  statins including Vytorin and Lipitor.  He is therefore on TriCor for  his  lipid management.   EXAMINATION:  GENERAL:  He is a stocky gentleman.  He has a small keloid  on his chest.  VITAL SIGNS:  Blood pressure is 130/70, pulse 78 and regular.  HEENT:  Normal.  NECK:  Carotids are normal without bruit.  LUNGS:  Clear.  HEART:  There is an S1 and S2 with normal heart sounds.  SKIN:  He has multiple tattoos and a scar by his left wrist.  ABDOMEN:  Benign.  LOWER EXTREMITIES:  Intact pulses.  No edema.   His EKG shows sinus rhythm with incomplete right bundle branch block.   IMPRESSION:  The patient's EKG looks benign.  He is having chest pain  which does not particularly sound anginal, however, he has aggressive  atherosclerotic disease and needs a followup stress Myoview.   I suspect we will increase his beta blocker to 50 b.i.d. since his  resting heart rate is 78.   He has had multiple problems with his cholesterol medicines.   He has been tried on statins as well as Zetia.  For the time being, we  will continue his TriCor and check his lipid profile.  It may be  worthwhile to add Niaspan to his regimen.  He is taking an aspirin a day  and I do not think that he needs any other antiplatelet agent.   We will refer him to one of the hand surgeons and get some median nerve  testing in regards to bilateral carpal tunnel  syndrome.  Further  recommendations for his heart will be based on the results of his stress  test, however, given his aggressive coronary disease and current use of  nitroglycerin, I will have a low threshold to recommend heart cath.     [name]. Donald Emms, MD, [facility]  Electronically Signed    PCN/MedQ  DD: 03/13/2006  DT: 03/13/2006  Job #: [id number]

## 2010-06-11 NOTE — H&P (Signed)
[place]  Patient:    [name], [name] Visit Number: [id number] MRN: [id number]          Service Type: MED Location: [facility] 2930 01 Attending Physician:  Eleanora Neighbor Dictated by:   Colleen Can. Deborah Chalk, M.D. Proc. 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  He presents with chest pain intermittently over the last 24 hours but severe during the 1 hour prior to admission.  He was referred for acute catheterization after his EKG showed acute inferior changes.  PAST MEDICAL HISTORY:  Past medical history is basically unremarkable.  He has not really had much medical care.  He has had a history of borderline hypertension.  ALLERGIES:  None known.  FAMILY HISTORY:  One brother has had a myocardial infarction.  SOCIAL HISTORY:  He is married.  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  He smokes a half a pack of cigarettes a day.  He will drink alcohol and drank a six pack of beer on the day prior to admission.  REVIEW OF SYSTEMS:  Otherwise unremarkable.  PHYSICAL EXAMINATION:  VITAL SIGNS:  On exam, his blood pressure initially was 150/90; heart rate was 90 and regular.  HEENT:  Negative.  SKIN:  Warm and dry.  He was tattooed.  LUNGS:  Clear.  HEART:  Regular rate and rhythm.  ABDOMEN:  Soft and nontender.  EXTREMITIES:  Without edema.  LABORATORY AND ACCESSORY DATA:  His EKG showed changes compatible with an acute inferoposterior myocardial infarction.  OVERALL IMPRESSION:  Acute inferoposterior myocardial infarction.  PLAN:  We will proceed on with emergency catheterization. Dictated by:   Colleen Can Deborah Chalk, M.D. Attending Physician:  Eleanora Neighbor DD:  04/09/01 TD:  04/10/01 Job: [id number]
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
## 2010-06-11 NOTE — Op Note (Signed)
NAMEKAMIN, NIBLACK NO.:  [id number]   MEDICAL RECORD NO.:  [id number]          PATIENT TYPE:  AMB   LOCATION:  DSC                          FACILITY:  [facility]   PHYSICIAN:  Wilmon Arms. Corliss Skains, M.D. DATE OF BIRTH:  12/17/1955   DATE OF PROCEDURE:  02/22/2005  DATE OF DISCHARGE:                                 OPERATIVE REPORT   PREOP DIAGNOSIS:  Lipomas left upper arm, left thigh.   POSTOP DIAGNOSIS.:  Lipomas left upper arm, left thigh.   PROCEDURE PERFORMED:  Excision lipomas left upper arm and left thigh.   SURGEON:  Tsuei.   ANESTHESIA:  Local MAC.   INDICATIONS:  The patient is a 55 year old male who previously underwent a  lipoma excision on his right shoulder. He presented with mass on his  posterior left upper arm as well as his lateral left thigh. Both these were  enlarging and are occasionally causing discomfort and he requests excision.   DESCRIPTION OF PROCEDURE:  The patient brought to the operating room and  placed supine position upon the operating table.  He was given intravenous  sedation. His left arm was extended superiorly and secured in place. His  thigh and left upper arm were prepped with Betadine and draped in sterile  fashion. The skin, subcutaneous tissues over each lipoma were infiltrated  with 0.25% Marcaine. Longitudinal incision was made over the lipoma in the  upper arm. Blunt dissection was used to dissect out the lipoma. Cautery was  used to cauterize the vessel. The wound was then closed with a deep layer of  3-0 Vicryl and subcuticular layer of 4-0 Monocryl. We then repeated the  procedure for the left thigh. The area was infiltrated with Marcaine and a  longitudinal incision was made. The lipoma was dissected out with cautery  and blunt dissection. The wound was closed with 3-0 Vicryl and subcuticular  4-0 Monocryl. Steri-Strips and clean dressing were applied. The patient was  awakened, brought to recovery in stable  condition. All sponge, instrument,  needle counts correct.      Wilmon Arms. Tsuei, M.D.  Electronically Signed     MKT/MEDQ  D:  02/22/2005  T:  02/22/2005  Job:  [id number]

## 2010-06-11 NOTE — Cardiovascular Report (Signed)
NAMEKAILON, TREESE NO.:  [id number]   MEDICAL RECORD NO.:  [id number]          PATIENT TYPE:  OIB   LOCATION:  1962                         FACILITY:  [facility]   PHYSICIAN:  Peter C. Eden Emms, MD, FACCDATE OF BIRTH:  [birth year]   DATE OF PROCEDURE:  03/29/2006  DATE OF DISCHARGE:                            CARDIAC CATHETERIZATION   [name] is a 55 year old patient with previous bypass surgery.  He has  been having increasing substernal chest pain.  Coronary arteriography  was done to assess graft patency.   Cine catheterization was done with 5-French catheters from the right  femoral artery.   The left main coronary artery was normal.   Left anterior descending artery had 50% tubular disease proximally.  There was subtotal occlusion of the mid vessel with competitive flow.   The circumflex coronary artery had a stent that was patent in the  proximal to ostial portion.  There appeared to be 40-50% ostial disease.  The mid vessel of 30-40% disease.  The patient had a small second obtuse  marginal branch with 70% diffuse disease.  The AV groove branch had 30-  40% discrete lesions.   The right coronary artery was dominant.  There was a 60-70% lesion  proximally.  There was an 80% lesion in the distal vessel.  There was  somewhat poor runoff with diffuse disease in the posterolateral and 30-  50% disease in the PDA.   The saphenous vein graft to diagonal branch was occluded.  The saphenous  vein graft to the OM was occluded.  The saphenous vein graft to the  right coronary artery was occluded.   The left internal mammary artery was widely patent to the LAD.   RAO ventriculography:  RAO ventriculography showed good LV function.  EF  was 60%.  There was no gradient across the aortic valve and no MR.  Aortic pressure was 106/76; LV pressure was 106/9.   LAO aortography was also performed to make sure we were not missing a  patent vein graft.  It confirmed  that all vein grafts were occluded.   IMPRESSION:  The patient has been having recurrent chest pain.  The left-  sided circulation seems adequate. We may need to take more pictures of  the ostium of the circumflex; however, his native right is unprotected.  It has at  least two areas of greater than 70% stenosis. Despite the small distal  vessel, I think it would be worthwhile to angioplasty the native right.  He will be admitted to the hospital given the amount of dye we use for  the diagnostic catheterization.  He will be started on heparin.  Intervention will be performed tomorrow.      [name], MD, [facility]  Electronically Signed     PCN/MEDQ  D:  03/29/2006  T:  03/29/2006  Job:  [id number]

## 2010-06-11 NOTE — Discharge Summary (Signed)
[facility]  Patient:    [name], Howard Visit Number: [id number] MRN: [id number]          Service Type: MED Location: 2000 2036 01 Attending Physician:  Eleanora Neighbor Dictated by:   Theressa Millard, M.D. Admit Date:  04/09/2001 Discharge Date: 04/11/2001                             Discharge Summary  DISCHARGE DIAGNOSES: 1. Acute myocardial infarction (inferior posterior) with subsequent emergent    coronary angiography with reasonably well-preserved left ventricular    function, global ejection fraction of at least 70%, successful angioplasty    and stent placement to the proximal left circumflex, and residual moderate    to severe disease in the right coronary and proximal left anterior    descending coronary artery. 2. Tobacco abuse. 3. Situational stress. 4. Borderline hypertension.  HISTORY OF PRESENT ILLNESS:  [name] is a 55 year old male who was brought to the emergency room with a one-hour duration of severe chest discomfort. His EKG showed acute inferior changes.  He was referred on for emergent cardiac catheterization.  Please see the dictated history and physical for patient presentation and profiling.  LABORATORY DATA:  On admission, his EKG showed changes compatible with an acute inferior posterior MI.  Chemistries were satisfactory.  CBC was within normal limits.  Peak CK-MB was 22.7.  HOSPITAL COURSE:  The patient was taken emergently to the cardiac catheterization lab.  That procedure was tolerated well without any known complications.  The findings were as stated above.  A subsequent 3.5 x 16 mm Express stent was placed in the proximal left circumflex with an overall satisfactory result obtained.  Post procedure he was transferred to the coronary care unit, where the remainder of his hospitalization has remained stable.  On April 10, 2001, he was doing well.  He has been very anxious and upset. His oldest son  has just recently been deployed with the armed services.  He has had no recurrence of chest pain.  Peak MB was 22.7.  Postprocedural labs have been stable.  Lipids are currently pending.  He was subsequently transferred to 2000 and today on April 11, 2001, he is doing well without complaints and was felt to be a stable candidate for discharge today.  DISCHARGE CONDITION:  Stable.  DISCHARGE MEDICATIONS: 1. Plavix 75 mg a day for 21 days. 2. Aspirin daily. 3. Lipitor 10 mg a day. 4. Toprol XL 50 mg a day. 5. Protonix 40 mg a day. 6. Nitroglycerin.  DISCHARGE INSTRUCTIONS:  Activity is to be light with no driving until next Monday.  He will be out of work a total of three weeks.  He is to call our office for any problems.  Otherwise we will plan on seeing him back in approximately seven to 10 days, and he is asked to call to schedule that appointment. Dictated by:   Theressa Millard, M.D. Attending Physician:  Eleanora Neighbor DD:  04/11/01 TD:  04/12/01 Job: [id number]

## 2010-06-11 NOTE — H&P (Signed)
[name], Howard NO.:  [id number]   MEDICAL RECORD NO.:  [id number]                   PATIENT TYPE:  INP   LOCATION:  1829                                 FACILITY:  [facility]   PHYSICIAN:  Cassell Clement, M.D.              DATE OF BIRTH:  [birth year]   DATE OF ADMISSION:  03/17/2002  DATE OF DISCHARGE:                                HISTORY & PHYSICAL   CHIEF COMPLAINT:  Chest pressure.   HISTORY OF PRESENT ILLNESS:  This is a 55 year old male admitted with  substernal chest pressure radiating into the neck and jaw.  The patient has  known coronary artery disease.  He presented here on April 09, 2001, with an  acute myocardial infarction and had acute angioplasty with stent to his  proximal circumflex at that time.  In October of 2003, he had a normal  stress Cardiolite.  More recently, he has had exertional chest pressure and  had an outpatient cardiac catheterization on February 15, 2002, which showed  that the stent to the circumflex was okay, but he did have a proximal 70%  LAD lesion, as well as a 60-70% right coronary artery lesion with scattered  disease.  It was felt that he might benefit from coronary artery bypass  graft.  He in fact did see Edward B. Tyrone Sage, M.D., on March 14, 2002,  and surgery was discussed and is planned in early March, although the exact  date has not yet been set.  The patient was to have called Dr. Dennie Maizes  office on March 18, 2002, to set the exact date and to determine when he  should stop his Plavix.  Today while the patient was working in the yard, he  noted substernal chest pressure with radiation to the jaw, mild diaphoresis,  and some dyspnea.  He did not have nausea or vomiting.  He took a total of  two nitroglycerin at home without relief and a third nitroglycerin en route  to the hospital with partial relief.  The pain resolved after he arrived  here and was given IV nitroglycerin.  His EKG  showed no ischemic changes,  however.   FAMILY HISTORY:  Positive for heart disease.  A brother died of some form of  heart disease, but had a heart murmur.  The patient has two brothers and a  sister with diabetes.  There is a also a positive family history for high  blood pressure.   SOCIAL HISTORY:  He is married.  He works in the [employer] of  [employer].  He has not been able to work since January 04, 2002,  because of his chest pains.  He had stopped smoking at the time of his heart  attack in March of 2003 and then started smoking again, but quit again for  the second time two weeks ago.  He does drink  four to five beers per day.   ALLERGIES:  No known drug allergies.   REVIEW OF SYSTEMS:  Negative.  No history of peptic ulcer disease, thyroid  problems, or genitourinary trouble.  He does have a history of  hypercholesterolemia.  He is not diabetic.   PRESENT MEDICATIONS:  1. Nexium 40 mg daily.  2. Plavix 75 mg daily.  3. Toprol XL 50 mg daily.  4. Aspirin 81 mg daily.  5. Lipitor 20 mg daily.  6. Accupril 40 mg daily.  7. Isosorbide mononitrile 60 mg daily.  8. Zetia 10 mg daily.   PHYSICAL EXAMINATION:  VITAL SIGNS:  The blood pressure is 144/92, pulse 68,  and respirations normal.  GENERAL APPEARANCE:  A well-developed, well-nourished gentleman in no  distress.  SKIN:  Warm and dry.  HEENT:  Pupils are equal and reactive.  Extraocular movements are full.  The  sclerae are clear.  Mouth and pharynx normal.  NECK:  Carotids normal.  Jugular venous pressure normal.  Thyroid normal.  CHEST:  Clear to auscultation and percussion.  HEART:  Quiet precordium.  There is no murmur, rub, gallop, or click.  There  is no abnormal lift or heave.  ABDOMEN:  Soft without hepatosplenomegaly or masses.  EXTREMITIES:  Good peripheral pulses.  No phlebitis or edema.   LABORATORY DATA:  The electrocardiogram is normal.  The CK-MB and troponin I  are pending.   The chest x-ray is normal.   IMPRESSION:  1. Ischemic chest pain, rule out myocardial infarction.  2. Known three-vessel coronary artery disease.  Awaiting coronary artery     bypass graft surgery.  3. Hypercholesterolemia.   DISPOSITION:  We are going to admit to telemetry to [employer]. Deborah Chalk, M.D.  Will do serial enzymes.  Will stop his Plavix.  Will treat him with IV  nitroglycerin and with subcutaneous Lovenox.  Further evaluation and  treatment as per Dr. Deborah Chalk.                                               Cassell Clement, M.D.    TB/MEDQ  D:  03/17/2002  T:  03/17/2002  Job:  [id number]   cc:   Gwenith Daily. Tyrone Sage, M.D.  [address]  [place]  [place] [address]  Fax: [phone]

## 2010-06-11 NOTE — Assessment & Plan Note (Signed)
[facility]                            CARDIOLOGY OFFICE NOTE   Howard, Donald                          MRN:          [id number]  DATE:03/16/2006                            DOB:          May 08, 1955    [name] returns today for followup. He had his stress test. His  scintigraphic images looked fine, there was a little thinning of the  inferolateral wall with an EF of 62%, however, the patient continues to  have horrible chest pain. He had 6/10 pain during the study and dropped  his blood pressure from 161/106 to 128/92. He had significant ectopy as  well as nausea and shortness of breath.   I talked to the patient at length. I think that given his recurrent  symptoms, the fact that he is taking a lot of nitroglycerin and the fact  that he had an abnormal hemodynamic response with exercise that we  should do a heart cath and make sure that his grafts are patent.   He has already had an inferior posterior MI in 2003 and cannot afford to  have further myocardial damage with an EF in the 45% range. His CABG was  in February 2004. We will try to get records prior to the cath.   MEDICATIONS:  1. Synthroid 100 mcg a day.  2. Aspirin a day.  3. Toprol 12.5 a day.   PHYSICAL EXAMINATION:  GENERAL:  He is overweight, he has multiple  tattoos, he is anxious.  VITAL SIGNS:  The blood pressure is 150/80, pulse 72 and regular.  HEENT:  Normal.  NECK:  Carotids are normal without bruit.  LUNGS:  Clear.  HEART:  There is an S1, S2 with normal heart sounds.  ABDOMEN:  Benign.  EXTREMITIES:  Lower extremities intact pulses, no edema.   IMPRESSION:  1. Previous coronary artery bypass graft in 2004.  2. Abnormal Myoview with significant exercise induced hypotension.  3. Continued chest pain requiring nitroglycerin.   I will make arrangements for the patient to have a heart cath in the  next week or so. He will continue his aspirin therapy. Further  recommendations will be based on the results of his heart cath.     [name], MD, [facility]  Electronically Signed    PCN/MedQ  DD: 03/16/2006  DT: 03/16/2006  Job #: [id number]

## 2010-06-11 NOTE — H&P (Signed)
NAME:  [name], [name] NO.:  [id number]   MEDICAL RECORD NO.:  [id number]                   PATIENT TYPE:   LOCATION:                                       FACILITY:   PHYSICIAN:  Colleen Can. Deborah Chalk, M.D.            DATE OF BIRTH:  1955-02-15   DATE OF ADMISSION:  02/15/2002  DATE OF DISCHARGE:                                HISTORY & PHYSICAL   CHIEF COMPLAINT:  Recurrent chest pain.   HISTORY OF PRESENT ILLNESS:  The patient  is a 55 year old male who has  known ischemic heart disease.  He had a previous  inferior posterior  myocardial infarction 10 months ago which was treated with emergent  angioplasty and stenting of the proximal left circumflex.  He has known  residual coronary disease.  He had a negative Cardiolite study in 10/03.  However, he has continued to have multiple bouts of recurrent chest pain and  tightness sometimes exertional and sometimes not.  He has other medical  somatic complaints.  In light of his recurrence of systems, he is now  referred for elective cardiac catheterization.   PAST MEDICAL HISTORY:  1. Atherosclerotic cardiovascular disease with previous inferior posterior     myocardial infarction with emergent cardiac catheterization showing     normal LV function,  EF 70%, angioplasty and stenting of the proximal     left circumflex, residual disease of a moderate to severe nature in the     right coronary and proximal LAD.  2. Past tobacco abuse.  3. Significant situational stress/depression/anxiety.  4. Hypertension, currently uncontrolled.  5. Gastroesophageal reflux disease.   ALLERGIES:  None.   CURRENT MEDICATIONS:  1. Lexapro 10 mg daily.  2. Protonix 40 mg daily.  3. Zetia 10 mg daily.  4. Accupril 20 mg twice a day.  5. Lipitor 20 mg daily..  6. Toprol XL 50 mg daily.  7. Plavix 75 mg daily.  8. Nitroglycerin p.r.n.  9. Xanax p.r.n.  10.      Imdur 60 mg daily.   FAMILY HISTORY:  Positive for  coronary disease.   SOCIAL HISTORY:  He is married.  He currently works in apartment  maintenance.  He previously smoked.   REVIEW OF SYSTEMS:  Basically as noted above.  He has had multiple bouts of  recurrent chest pain and has taken nitroglycerin on several occasions.  He  has had a recent bout of the flu.  He continues to have GI symptoms to  include indigestion which is relieved with Nexium or Protonix.  He has had  intermittent spells of dizziness.  He is sleeping too much.  He is currently  unable to work in light of the multitude of somatic complaints.   PHYSICAL EXAMINATION:  VITAL SIGNS:  He is somewhat anxious.  Blood pressure  120/100 sitting, 130/100 standing, heart rate 76, weight is 213.5.  SKIN:  Warm and dry.  Color is unremarkable.  NECK:  Supple.  LUNGS:  Clear.  HEART:  Regular rhythm.  ABDOMEN:  Soft.  Positive bowel sounds.  Nontender.  EXTREMITIES:  Without edema.  NEUROLOGIC:  Intact.   LABORATORY DATA:  Pending.   IMPRESSION:  1. Recurrent bouts of chest pain/angina.  2. Known ischemic heart disease with previous  myocardial infarction and     revascularization and known residual disease.  3. Prior tobacco use.  4. Significant situational stress.  5. Hypertension.  6. Hyperlipidemia.   PLAN:  We will proceed on with elective cardiac catheterization.  The  procedure has been reviewed in full detail and he is going to proceed on  1/23.     Juanell Fairly C. Earl Gala, N.P.                 Colleen Can. Deborah Chalk, M.D.    LCO/MEDQ  D:  02/06/2002  T:  02/06/2002  Job:  [id number]

## 2010-06-11 NOTE — Assessment & Plan Note (Signed)
[facility]                            CARDIOLOGY OFFICE NOTE   DANIELL, MANCINAS                        MRN:          [id number]  DATE:04/13/2006                            DOB:          1955/09/24    Shamar returns today for followup.  He was all worked up today.  Unfortunately, he had a child support hearing by phone.  His ex-wife  lives in [place] with his 56 year old son.  He seems to be having more  than half his monthly check garnished for child support.  He is quite  agitated and upset about this.  He indicated having occasional chest  pain the last day or two.  He is status post coronary bypass surgery.  We just cathed him in the hospital a week or two ago, and all of his  grafts were occluded.  Fortunately, he only had moderate disease in his  left arteries.  His LIMA was patent to the LAD.  All of the vein grafts  were occluded.  He had native intervention to the distal right coronary  artery but still has diffuse small vessel disease in the PDA and PLA.   I think his coronary status is stable.  He has been compliant with his  meds.  We are trying to get his Plavix through the medical assistance  program.   MEDICATIONS:  1. Aspirin a day.  2. Nexium 40 a day.  3. Clonazepam 1-1/2 p.r.n.  4. Lopressor 50 b.i.d.  5. Tricor 145.  6. Rhinocort.  7. Plavix 75 a day.   REVIEW OF SYSTEMS:  The patient's review of systems is otherwise  negative.  He has not had any syncope, palpitations, PND or orthopnea.   EXAMINATION:  VITAL SIGNS:  His exam is remarkable for a blood pressure  of 120/70.  Pulse is 70 and regular.  HEENT:  Normal.  He has multiple tattooes.  Carotids are normal.  There  is no bruits.  LUNGS:  Clear.  HEART:  There is an S1, S2, normal heart sounds.  ABDOMEN:  Benign.  EXTREMITIES:  Lower extremities:  Intact pulses, no edema.   His EKG is entirely normal.   IMPRESSION:  Stable, previous bypass surgery with  occlusion of vein  grafts, patent LIMA to the LAD, stent to the native distal right.  Continue aspirin and Plavix.   We will continue his cholesterol medicine.  He will need liver tests in  6 months.   I tried to talk to Kasten at length today regarding options for his legal  issues.  Unfortunately, I think this will be continued source of  aggravation for him.  He will call me if he has any prolonged pain.  He  knows to go to the emergency room for any chest pain that is not  relieved by 3 or more nitro.  I will see him back in about 3 months.     [name], MD, [facility]  Electronically Signed    PCN/MedQ  DD: 04/13/2006  DT: 04/13/2006  Job #: [id number]

## 2010-06-11 NOTE — Discharge Summary (Signed)
[name], [name] NO.:  [id number]   MEDICAL RECORD NO.:  [id number]                   PATIENT TYPE:  INP   LOCATION:  2007                                 FACILITY:  [facility]   PHYSICIAN:  Gwenith Daily. Tyrone Sage, M.D.            DATE OF BIRTH:  18-Sep-1955   DATE OF ADMISSION:  03/17/2002  DATE OF DISCHARGE:                                 DISCHARGE SUMMARY   ADMISSION DIAGNOSIS:  Known coronary artery disease with chest pain.   PAST MEDICAL HISTORY:  1. Coronary artery disease, status post acute myocardial infarction in March     2003, status post angioplasty and stent placement in the proximal     coronary artery by Dr. Deborah Chalk, treated medically since that time.  2. Hypertension.  3. Hyperlipidemia.  4. Tobacco use, several packs a day times 20 years, recently decreased     smoking to four to five cigarettes a day.   ALLERGIES:  ALEVE causes shortness of breath.   DISCHARGE DIAGNOSIS:  Three vessel coronary artery disease with increasing  symptoms, status post coronary artery bypass grafting.   BRIEF HISTORY:  The patient is a 55 year-old man with known coronary artery  disease who over the past several months noted increasing episodes of  fatigue and shortness of breath with exertion as well as epigastric  discomfort. Because of his symptoms he underwent repeat cardiac  catheterization by Dr. Deborah Chalk on February 15, 2002.  This revealed severe  three vessel coronary artery disease and normal left ventricular function.  Because these regions were not amenable to PCI, he was referred for cardiac  surgery evaluation. He was evaluated by Dr. Tyrone Sage of the [facility] office on  March 14, 2002.  After examination of the patient and reviewing all the  records, Dr. Tyrone Sage recommended proceeding with coronary artery bypass  grafting.  The procedure risks and benefits were discussed.  At that time,  the patient preferred to continue considering his  options and the plan was  to call back to Dr. Tyrone Sage within the next several days.   On February 22, the patient was working in the yard and noted substernal  chest pressure with radiation to his jaw, mild diaphoresis and some dyspnea.  This pain was not relieved with sublingual nitroglycerin.  He presented to  the emergency department and his pain was resolved after IV nitroglycerin.  He was evaluated in the emergency department by Dr. Cassell Clement.  Admission EKG showed no ischemic changes; however, because of his symptoms  Dr. Patty Sermons admitted him to the hospital.  The plan was for serial  enzymes, stop his Plavix, treat with IV nitroglycerin and subcutaneous  Lovenox.   HOSPITAL COURSE:  The patient was admitted to the hospital under the care of  Dr. Roger Shelter on March 17, 2002.  His chest pain resolved and his  enzymes eventually came back as negative.  Dr. Tyrone Sage was contacted and he  re-evaluated the patient and because he continued to take his Plavix until  February 22, his surgery was scheduled for Friday, February 26.  He remained  stable in the hospital over the next several days.   On March 21, 2002 the patient underwent the following surgical procedure  by Dr. Sheliah Plane:  Coronary artery bypass grafting times six.  Grafts  placed at the time of the procedure:  Left internal mammary artery graft to  the left anterior descending artery, saphenous vein graft to the diagonal  artery, saphenous vein graft in sequential fashion to the first, second and  third obtuse marginal arteries and vein was grafted to the posterior  descending artery.  The vein was harvested from the right thigh via the Endo  vein harvesting technique.  The patient tolerated his procedure very well  and was transferred in stable condition to the SICU.  He remained  hemodynamically stable in the immediate postoperative period and was  extubated several hours after arrival in the  intensive care unit.  The  patient's postoperative course has been essentially uneventful.   Postoperative issues did include:  1. Volume overload. He is responding to diuretics.  2. Respirator status.  He has required supplemental oxygen until today,     postoperative day three.  We will continue to work on weaning him from     his oxygen.  This pulmonary difficulty is most likely related to his     tobacco history.  3. Anxiety requiring anxiolytics using a small amount of Xanax.   March 25, 2002, postoperative day three, the patient reports he is feeling  pretty well. His heart is maintaining normal sinus rhythm.  His lungs do  have decreased breath sounds at the right base, otherwise clear.  No wheezes  or crackles are noted.  He is tolerating his diet and he has no nausea. His  bladder and  bowel function are within normal limits.  His incisions are all  healing well. He does have 1+ edema in his right lower leg and foot. His  ambulation is improving.  His pain control is adequate. His anxiety is  relieved with 0.25 mg of Xanax.  The patient is making very good progress in  recovering from his surgery and if he can be weaned from his supplemental  oxygen we anticipate that he will be ready for discharge home in the next 24  to 48 hours.   LABORATORY DATA:  Recent laboratory studies on February 29:  CBC:  White  blood cell count 9.3, hemoglobin 9.3, hematocrit 26.7, platelets 110,000.  Chemistries:  Sodium 135, potassium 3.7, BUN 14, creatinine 1.2, glucose  145.   CONDITION ON DISCHARGE:  Improved.   DISCHARGE MEDICATIONS:  1. Lasix 40 mg p.o. daily times seven days.  2. Potassium chloride 20 mEq p.o. daily times seven days.  3. Colace 200 mg p.o. daily.  4. He has been instructed to resume the following home medications:  Enteric     coated aspirin 325 mg p.o. daily - this is a new dose for him, Toprol XL     50 mg p.o. daily, Lipitor 20 mg p.o. daily, Zetia 10 mg p.o.  daily,    Nexium 40 mg p.o. daily, home dose of Accupril has not been resumed as     his systolic blood pressures have been in the low 100 range.   PAIN MANAGEMENT:  He can have Tylox one to two  p.o. q.4. to 6h p.r.n. for  pain.   ACTIVITY:  He has been asked to refrain from any driving or any heavy  lifting, pushing or pulling.  He has also been instructed to continue his  breathing exercises and daily walking.  He has been reminded to refrain from  smoking.   DIET:  Low fat, low salt diet.   WOUND CARE:  He may shower with mild soap and water.  If his incisions are  red, hot, swollen or draining or if he has a fever greater than 101.0  degrees Fahrenheit he is to call Dr. Dennie Maizes office.   FOLLOW UP:  1. Dr. Deborah Chalk should see him in the office in approximately two weeks.  He     has bene asked to call to arrange this     appointment. He will have a chest x-ray that day.  2. He has an appointment to see Dr. Tyrone Sage at the [facility] office on Thursday,     April 18, 2002 at 1 p.m.  He has been instructed to bring his chest x-ray     from Dr. Ronnald Nian office for that visit.     Toribio Harbour, R.N.                  Gwenith Daily. Tyrone Sage, M.D.    CTK/MEDQ  D:  03/25/2002  T:  03/25/2002  Job:  [id number]   cc:   Colleen Can. Deborah Chalk, M.D.  [address], [address]  [place]  [place] [address]  Fax: [phone]

## 2010-06-11 NOTE — Discharge Summary (Signed)
NAMEMARWIN, PRIMMER NO.:  [id number]   MEDICAL RECORD NO.:  [id number]          PATIENT TYPE:  INP   LOCATION:  6533                         FACILITY:  [facility]   PHYSICIAN:  [name], MD, FACCDATE OF BIRTH:  [birth year]   DATE OF ADMISSION:  03/29/2006  DATE OF DISCHARGE:  03/31/2006                               DISCHARGE SUMMARY   PRIMARY CARDIOLOGIST:  Theron Arista C. Eden Emms, MD, [facility].   REASON FOR ADMISSION:  Chest pain and abnormal blood pressure response  during stress testing.   DISCHARGE DIAGNOSES:  1. Coronary artery disease.      a.     History of bypass surgery, 2004.      b.     History of myocardial infarction, treated with percutaneous       coronary intervention in 2003.      c.     Three out of four grafts occluded this admission by cardiac       catheterization.      d.     Status post percutaneous coronary intervention to the native       right coronary artery this admission (please see below -- Perseus       trial).      e.     Preserved left ventricular function.  2. Hypertension.  3. Hyperlipidemia with hypertriglyceridemia.  4. Diabetes mellitus.  5. Ex smoker.  6. Family history of coronary artery disease.   PROCEDURES PERFORMED:  1. Cardiac catheterization by Dr. Charlton Haws, 03/29/2006, revealing an      ejection fraction of 60%, native RCA with 50% mid, 70-80% distal,      vein graft to the OM, vein graft to the first diagonal, vein graft      to the RCA all occluded; LIMA to the LAD patent.  2. Percutaneous coronary intervention by Dr. Hector Brunswick, 03/30/2006:      Status post Taxus drug-eluting stent (Perseus study) to the distal      native RCA producing stenosis from 90 to 0%.  Residual stenosis in      the proximal RCA slightly less than 50%.   HISTORY OF PRESENT ILLNESS:  Mr. Tailor is a 55 year old male patient,  initially seen by Dr. Eden Emms on 03/13/2006.  He has a history of coronary  disease, status post CABG.  He had  previous percutaneous coronary  intervention in 2001 and 2003.  He was set up for an outpatient stress  test.  His images returned with a little thinning in the inferolateral  wall with an ejection fraction of 62%.  He continued to have horrible  chest pain.  He also dropped his blood pressure from 161/106 to 120/92  during the stress test.  Given his history and abnormal blood pressure  response, it was felt that he should undergo cardiac catheterization for  further evaluation.   HOSPITAL COURSE:  The patient presents to [facility]  on 03/29/2006 for cardiac catheterization.  Please see Dr. Fabio Bering  complete note for details, and please see the results as noted above.  He  was admitted to the hospital and placed on heparin and set up for  percutaneous coronary intervention.  He was noted to have an elevated  hemoglobin A1C.  It was felt that he needed aggressive control of his  diabetes.  As noted above, he was enrolled in the Perseus trial; and he  underwent percutaneous coronary intervention to the RCA with a Taxus  Perseus stent.  This was done by Dr. Dickie La on 03/30/2006.  He tolerated  the procedure well and had no immediate complications.  He was evaluated  by Dr. Eden Emms on the morning of 03/31/2006.  He felt the patient was  stable enough for discharge to home.  He would need for Plavix for one  year.  We have provided some assistance for him to obtain his Plavix.  He should have a 3-week supply before he leaves the hospital, and he is  enrolled in the PPA program.  His wife plans to send a prescription to  that program to receive a 90-day supply at a time.   I tried to set the patient up with his previous primary care doctor, Dr.  Timothy Lasso, for his diabetes.  We feel he needs aggressive control of his  diabetes.  Dr. Timothy Lasso had discharged the patient from his practice some  time ago and could not see him again in the hospital.  I therefore tried  to set him up with Dr.  Altheimer's practice.  When I informed the  patient and his wife the cost of the consultation, they noted that they  would not be able to afford this.  Therefore, I have arranged for him to  follow up with our primary care group.  He will see Dr. Debby Bud in one  week.  In discussions with our internal medicine team in the hospital,  we have decided to place the patient on Metformin 500 mg twice per day.  Since he has just had contrast for his cardiac catheterization, will  hold off on starting this for the next 48 hours.  He will start on  Metformin on April 02, 2006.   The patient is felt that stable enough for discharge to home.  He will  follow up with Dr. Eden Emms in the next couple of weeks.   LABORATORY INVESTIGATIONS:  White count 6,100, hemoglobin 14.8,  hematocrit 42.4, platelet count 175,000.  Sodium 138, potassium 4.2,  glucose 115, BUN 12, creatinine 1.09.  Postprocedure troponin I peak  0.09, CK-MB is negative.  Chest x-ray, March 4th, negative for interval  change.   MEDICATIONS ON DISCHARGE:  1. Aspirin 325 mg daily.  2. Plavix 75 mg daily.  3. Metoprolol 50 mg twice per day.  4. Nexium 40 mg daily.  5. Tricor 145 mg daily.  6. Klonopin as directed.  7. Rhinocort as directed.  8. Nitroglycerin as needed for chest pain.  9. Metformin 500 mg twice per day, to be started on April 02, 2006.   DISCHARGE DIET:  Low fat, low sodium, diabetic diet.   WOUND CARE:  He should call our office for any ongoing issues or fever.   DISCHARGE ACTIVITIES:  He is to increase activities slowly.  He may walk  up steps.  He may shower.  No lifting or sexual activity for two weeks,  no driving for one week.   FOLLOW-UP:  1. The patient will see Dr. Illene Regulus on March 13th at 1:45 p.m.      for follow-up on his diabetes and initiation  of Metformin.  2. He will see Dr. Eden Emms on March 20th at 11:00 a.m. at our office in      [place]. 3. The research staff will contact him with  follow-up for the Perseus      trial.   TOTAL PHYSICIAN/P.A. TIME:  Greater than 30 minutes on his discharge.      Tereso Newcomer, PA-C      Peter C. Eden Emms, MD, [facility]  Electronically Signed    SW/MEDQ  D:  03/31/2006  T:  04/01/2006  Job:  [id number]

## 2010-06-11 NOTE — Cardiovascular Report (Signed)
[facility]  Patient:    [name], [name] Visit Number: [id number] MRN: [id number]          Service Type: MED Location: [facility] 2930 [id number] Attending Physician:  Eleanora Neighbor Dictated by:   Colleen Can. Deborah Chalk, M.D. Proc. Date: 04/05/01 Admit Date:  04/09/2001                          Cardiac Catheterization  HISTORY: The patient is a 55 year old male who presents with acute inferior posterior myocardial infarction.  PROCEDURE: Left heart catheterization with selective coronary angiography, left ventricular angiography, and angioplasty with stenting of the proximal left circumflex coronary artery.  TYPE AND SITE OF ENTRY: Percutaneous right femoral artery.  CATHETERS: A 6 French 4 curved Judkins right and left coronary catheters, 6 French pigtail ventriculographic catheter, 7 [place] guide catheter, Hi-Torque Floppy guide wire, a 2.5 x 20 mm Maverick balloon, a 3.5 x 16 mm Express stent.  MEDICATIONS GIVEN PRIOR TO THE PROCEDURE: Heparin and nitroglycerin.  MEDICATIONS GIVEN DURING THE PROCEDURE: Heparin, nitroglycerin, Integrilin, morphine, and Versed 2 mg IV.  COMMENTS: The patient tolerated the procedure well.  HEMODYNAMIC DATA: The aortic pressure was 133/95 and LV pressure was 133/25. There was no aortic valve gradient noted on pullback.  ANGIOGRAPHIC DATA: 1. Right coronary artery: The right coronary artery was a dominant vessel. It    had diffuse atherosclerotic disease with a 60-70% narrowing proximally.    Then in sequential and segmental fashions it had scattered 40-50%    narrowing up to and just prior to the crux. There were irregularities in    the distal right coronary artery as well. 2. Left main coronary artery: Left main coronary artery is short and    essentially normal. 3. Left circumflex: The left circumflex has a subtotal occlusion    proximally with some slow but definitely visible TIMI grade 2 flow    distally.  There was dye hang-up in the largest of the obtuse marginal    branches. This severe stenosis involved the origin of a small proximal    marginal vessel. 4. Left anterior descending: The left anterior descending is a long vessel    that crosses the apex. There is a 50% narrowing proximally in the    left anterior descending. There is a moderate sized diagonal vessel with    30-50% scattered narrowings.  LEFT VENTRICULOGRAPHY: The left ventricular angiogram was performed after the angioplasty. Overall, there is normal LV function. There was no abnormal regional wall motion segments that could be seen in the RAO projection. The left ventricular size appeared to be normal.  ANGIOPLASTY PROCEDURE: We exchanged catheters for a JL4, 7 [place] guide. A Hi-Torque Floppy guide wire was passed distally. We initially inflated with a 2.5 x 20 mm Maverick balloon. This resulted in improvement of flow. We then placed a 3.5 x 16 mm Express stent proximally. It was inflated to a maximum of 14 atmospheres. This resolved and no residual stenosis. We had persistence of flow into the small marginal vessel that arose within the stenosis and stented segment, even though it was at a 90% ostial narrowing. We did not try to recanulate this small vessel.  OVERALL IMPRESSION: 1. Acute inferior posterior myocardial infarction. 2. Reasonably well preserved left ventricular function with no    abnormal wall motion segment seen in the right anterior oblique    projection and with a global ejection fraction of at  least 70%. 3. Successful angioplasty and stent placement in the proximal left    circumflex coronary. 4. Residual moderately severe disease in the right coronary artery and    proximal left anterior descending. Dictated by:   Colleen Can Deborah Chalk, M.D. Attending Physician:  Eleanora Neighbor DD:  04/09/01 TD:  04/09/01 Job: [id number]

## 2010-06-11 NOTE — Cardiovascular Report (Signed)
[name], [name] NO.:  [id number]   MEDICAL RECORD NO.:  [id number]                   PATIENT TYPE:  OIB   LOCATION:  2899                                 FACILITY:  [facility]   PHYSICIAN:  Colleen Can. Deborah Chalk, M.D.            DATE OF BIRTH:  01/12/1956   DATE OF PROCEDURE:  02/15/2002  DATE OF DISCHARGE:  02/15/2002                              CARDIAC CATHETERIZATION   PROCEDURE:  Left heart catheterization with selective coronary angiography,  left ventricular angiography.   SURGEON:  Colleen Can. Deborah Chalk, M.D.   SITE OF ENTRY:  Percutaneous right femoral artery.   CATHETERS:  6 French curved Judkins right and left coronary catheters, 6  French pigtail ventricular grafting catheter.   CONTRAST MATERIAL:  Omnipaque.   MEDICATIONS GIVEN PRIOR TO THE PROCEDURE:  Valium 10 mg p.o.   MEDICATIONS GIVEN DURING THE PROCEDURE:  Versed 8 mg IV.   COMMENTS:  The patient tolerated the procedure well.   INDICATIONS:  The patient had previous inferior myocardial infarction with  an occluded left circumflex with stent placed in April 09, 2001.  He is  referred now for a followup cardiac catheterization because of recurrent and  persistent chest pain limiting his ability to return to work.   HEMODYNAMIC DATA:  The aortic pressure was 98/74.  LV was 98.6-9.  There was  no aortic valve gradient noted on pullback.   LEFT VENTRICULAR ANGIOGRAM:  The left ventricular angiogram was performed in  the RAO position.  Overall cardiac size and silhouette are normal.  Regional  wall motion is normal.  The ejection fraction is 60-70%.  There is no mitral  regurgitation or intracavitary filling defect or intracardiac calcification.   CORONARY ARTERIES:  The coronary arteries arise and distribute normally.   1. Right coronary artery:  The right coronary artery is a dominant vessel.     There is a 70+ percent stenosis in the segmental fascia in the proximal     left  circumflex.  A large acute marginal vessel arises after that.  There     are sequential 50% lesions prior to the posterior descending and     posterior lateral branches.  At the crux there is a 50% narrowing.  The     distal vessels will be suitable for grafting.   1. Left main coronary artery is essentially normal.   1. Left anterior descending: The left anterior descending has a segmental     ostial to very proximal 70% narrowing.  There is a small diagonal vessel     that arises within this stenosis proximally.  There is a second diagonal     vessel which has an ostial 90% narrowing.  Distally, the left anterior     descending is widely patent and should receive a nice bypass graft.   1. Left circumflex:  The left circumflex is a large vessel.  The stent is     widely patent.  There is 30-40% proximal narrowing that is proximal to     the stent.  There is a high obtuse marginal vessel that has a 90% focal     narrowing.  The second obtuse marginal has a 90% narrowing.  There is     distal posterior lateral branches that are without severe obstructive     disease, but there is mild to moderate atherosclerosis distally.   OVERALL IMPRESSION:  1. Normal left ventricular function.  2. Severe three vessel disease with 70% percent segmental right coronary     artery with possible bypassable segments including an acute marginal,     posterior descending and posterior lateral branch, severe stenosis in the     proximal left anterior descending with bypassable segments including the     second diagonal and distal left anterior descending, persistent patency     of the stent in the left circumflex but with severe stenosis in the first     and second obtuse marginal branches and moderate diffuse disease     distally.   DISCUSSION:  In light of the ongoing symptoms, it is felt that the patient  is probably best managed surgically.  Because of the severe stenosis in the  proximal left anterior  descending and the areas of ostial stenosis in  diagonal and marginal vessels, he will be seen with first surgical  consultation.  He is a candidate for stent placement in the proximal left  anterior descending as well as the right coronary artery though we would be  left with residual disease in these distal vessels.                                               Colleen Can. Deborah Chalk, M.D.    SNT/MEDQ  D:  02/15/2002  T:  02/16/2002  Job:  [id number]

## 2010-09-06 ENCOUNTER — Inpatient Hospital Stay (HOSPITAL_COMMUNITY)
Admission: EM | Admit: 2010-09-06 | Discharge: 2010-09-09 | DRG: 247 | Disposition: A | Discharge status: Home / Self Care | Payer: Medicare Other | Attending: Internal Medicine | Admitting: Internal Medicine

## 2010-09-06 ENCOUNTER — Inpatient Hospital Stay (HOSPITAL_COMMUNITY): Payer: Medicare Other

## 2010-09-06 ENCOUNTER — Emergency Department (HOSPITAL_COMMUNITY): Payer: Medicare Other

## 2010-09-06 ENCOUNTER — Encounter: Payer: Self-pay | Admitting: Internal Medicine

## 2010-09-06 ENCOUNTER — Telehealth: Payer: Self-pay | Admitting: Cardiovascular Disease

## 2010-09-06 DIAGNOSIS — Y849 Medical procedure, unspecified as the cause of abnormal reaction of the patient, or of later complication, without mention of misadventure at the time of the procedure: Secondary | ICD-10-CM | POA: Diagnosis present

## 2010-09-06 DIAGNOSIS — G8929 Other chronic pain: Secondary | ICD-10-CM | POA: Diagnosis present

## 2010-09-06 DIAGNOSIS — T82897A Other specified complication of cardiac prosthetic devices, implants and grafts, initial encounter: Secondary | ICD-10-CM | POA: Diagnosis present

## 2010-09-06 DIAGNOSIS — M549 Dorsalgia, unspecified: Secondary | ICD-10-CM | POA: Diagnosis present

## 2010-09-06 DIAGNOSIS — I2510 Atherosclerotic heart disease of native coronary artery without angina pectoris: Secondary | ICD-10-CM | POA: Diagnosis present

## 2010-09-06 DIAGNOSIS — Z79899 Other long term (current) drug therapy: Secondary | ICD-10-CM

## 2010-09-06 DIAGNOSIS — I25810 Atherosclerosis of coronary artery bypass graft(s) without angina pectoris: Principal | ICD-10-CM | POA: Diagnosis present

## 2010-09-06 DIAGNOSIS — E785 Hyperlipidemia, unspecified: Secondary | ICD-10-CM | POA: Diagnosis present

## 2010-09-06 DIAGNOSIS — F1010 Alcohol abuse, uncomplicated: Secondary | ICD-10-CM | POA: Diagnosis present

## 2010-09-06 DIAGNOSIS — R109 Unspecified abdominal pain: Secondary | ICD-10-CM

## 2010-09-06 DIAGNOSIS — Z7982 Long term (current) use of aspirin: Secondary | ICD-10-CM

## 2010-09-06 DIAGNOSIS — K5900 Constipation, unspecified: Secondary | ICD-10-CM | POA: Diagnosis present

## 2010-09-06 DIAGNOSIS — I200 Unstable angina: Secondary | ICD-10-CM | POA: Diagnosis present

## 2010-09-06 DIAGNOSIS — E119 Type 2 diabetes mellitus without complications: Secondary | ICD-10-CM | POA: Diagnosis present

## 2010-09-06 DIAGNOSIS — I10 Essential (primary) hypertension: Secondary | ICD-10-CM | POA: Diagnosis present

## 2010-09-06 DIAGNOSIS — R079 Chest pain, unspecified: Secondary | ICD-10-CM

## 2010-09-06 DIAGNOSIS — I2582 Chronic total occlusion of coronary artery: Secondary | ICD-10-CM | POA: Diagnosis present

## 2010-09-06 DIAGNOSIS — M109 Gout, unspecified: Secondary | ICD-10-CM | POA: Diagnosis present

## 2010-09-06 LAB — LIPASE, BLOOD: Lipase: 1467 U/L — ABNORMAL HIGH (ref 11–59)

## 2010-09-06 LAB — POCT I-STAT TROPONIN I: Troponin i, poc: 0 ng/mL (ref 0.00–0.08)

## 2010-09-06 LAB — ETHANOL: Alcohol, Ethyl (B): <11 mg/dL (ref 0–11)

## 2010-09-06 LAB — DIFFERENTIAL
Basophils Absolute: 0 10*3/uL (ref 0.0–0.1)
Basophils Relative: 0 % (ref 0–1)
Eosinophils Absolute: 0.1 10*3/uL (ref 0.0–0.7)
Monocytes Absolute: 0.5 10*3/uL (ref 0.1–1.0)
Monocytes Relative: 8 % (ref 3–12)
Neutro Abs: 4.1 10*3/uL (ref 1.7–7.7)

## 2010-09-06 LAB — COMPREHENSIVE METABOLIC PANEL
AST: 183 U/L — ABNORMAL HIGH (ref 0–37)
Albumin: 3.7 g/dL (ref 3.5–5.2)
BUN: 24 mg/dL — ABNORMAL HIGH (ref 6–23)
Calcium: 9.3 mg/dL (ref 8.4–10.5)
Chloride: 105 mEq/L (ref 96–112)
Creatinine, Ser: 1.18 mg/dL (ref 0.50–1.35)
Total Bilirubin: 0.5 mg/dL (ref 0.3–1.2)
Total Protein: 7.4 g/dL (ref 6.0–8.3)

## 2010-09-06 LAB — CARDIAC PANEL(CRET KIN+CKTOT+MB+TROPI)
Total CK: 238 U/L — ABNORMAL HIGH (ref 7–232)
Troponin I: <0.3 ng/mL (ref <0.30)

## 2010-09-06 LAB — CBC
MCH: 28.8 pg (ref 26.0–34.0)
MCHC: 33 g/dL (ref 30.0–36.0)
Platelets: 183 10*3/uL (ref 150–400)
RDW: 12.8 % (ref 11.5–15.5)

## 2010-09-06 NOTE — H&P (Signed)
Hospital Admission Note Date: 09/06/2010  Patient name: [name] Medical record number: [id number] Date of birth: 04-Jun-1955 Age: 55 y.o. Gender: male PCP: Dr. Burnell Blanks, [facility] Cards: [facility] Cards Medical Service: Teaching Service  Attending physician: Margarito Liner    Pager: Resident (R2/R3): Darnelle Maffucci    Pager: [phone] Resident (R1): Kathreen Cosier  Pager: [phone]  Chief Complaint: Chest Pain  History of Present Illness: Patient is a 55 year old man with extensive CAD (including CABG in 2004 and 2 stents in the 1990s), diabetes, hypertension, and hyperlipidemia who presents with a one to two-month history of intermittent chest pain that was worse this morning. Patient has had chest pain for the past 2 months that is typically relieved with one to 2 nitroglycerin and rest, and feels like an elephant sitting on his chest with intense pressure. On occasion his pain has radiated to his left jaw and arm. This morning, however, the patient experienced similar chest pain that was not relieved with 3 nitroglycerin. He also had nausea, diaphoresis, and a headache following the third nitroglycerin. He denies any vomiting or diarrhea. The time of presentation in the emergency room the patient was not in any pain. He had just received morphine.  During and a similar one to two-month time period, the patient has also experienced umbilical abdominal pain. The pain is nonradiating and harder for the patient to describe. He has had normal bowel movements though does describe recent dark stools without any bright blood per rectum. He has a extensive alcohol history, having drank roughly a 12 pack per day for nearly 40 years before quitting 8-9 months ago. He denies any exacerbation of the pain with food and was not in any pain at the time of examination.  PMH: --CAD: Status post 2 stents in the mid 1990s, Status post CABG in 2004. Catheterization in 2008: Occlusion of all  vein grafts with patency of lima to LAD, stent of RCA at that time. Catheterization in 2009:  1. Follow-up catheterization as part of the PERUSES study.   2. Coronary artery disease, status post coronary bypass graft surgery       in 2004.   3. Severe native vessel disease with 8 cm in the proximal left       anterior descending coronary artery, 40% ostial and 50% mid       stenosis in the circumflex artery, and 70% mid stenosis in the       right coronary with 20% focal narrowing within the stent in the mid       to distal right coronary artery.   4. Patent left internal mammary artery graft to the left anterior       descending coronary artery with 50% narrowing in the left anterior       descending coronary artery distal to the insertion site, occluded       vein graft to the diagonal branch of the left anterior descending       coronary artery, occluded vein graft to the circumflex artery, and       occluded vein graft to the right coronary artery.   5. Normal left ventricular function. --Hypertension --Hyperlipidemia --Myocardial infarction --Diabetes --History of depression --GERD --Chronic back pain  Meds: Nexium 40 mg WelChol Crestor 40 mg 30th 10 mg TriCor 145 mg Ativan Toprol XL 1.5 tabs Plavix 75mg  Lisinopril 30  Allergies: NKDA  FH: Multiple members c MI and stroke Multiple members c DM  SH: --Former Corporate investment banker,  now on disability --12 pack of beer daily x 40 years, now 2-3 glasses red wine/wk --former pa ck/day smoker until 8 years ago --no drugs  Review of Systems: As per history of present illness  Physical Exam: 97.8     106/49 65 20 99%/RA GEN: No apparent distress.  Alert and oriented x 3.  Pleasant, conversant, and cooperative to exam. HEENT: head is autraumatic and normocephalic.  Sclerae anicteric.  Conjunctivae without pallor or injection. RESP:  Lungs are clear to ascultation bilaterally with good air movement.  No wheezes,  ronchi, or rubs. CARDIOVASCULAR: regular rate, normal rhythm.  Clear S1, S2, no murmurs, gallops, or rubs. ABDOMEN: soft, non-tender, non-distended. EXT: warm and dry.  Peripheral pulses equal, intact, and +2 globally.  No clubbing or cyanosis.  No edema in b/l lower extremeties SKIN: warm and dry with normal turgor.  No rashes or abnormal lesions observed. NEURO: CN II-XII grossly intact.  Muscle strength +5/5 in bilateral upper and lower extremities.  Sensation is grossly intact.  No focal deficit.  Lab results: CBC: Recent Labs  Basename 09/06/10 1527   WBC 6.0   NEUTROABS 4.1   HGB 12.3*   HCT 37.3*   MCV 87.4   PLT 183   Basic Metabolic Panel: Recent Labs  Basename 09/06/10 1527   NA 138   K 4.5   CL 105   CO2 24   GLUCOSE 115*   BUN 24*   CREATININE 1.18   CALCIUM 9.3   MG --   PHOS --   Liver Function Tests: Recent Labs  [facility] 09/06/10 1527   AST 183*   ALT 107*   ALKPHOS 40   BILITOT 0.5   PROT 7.4   ALBUMIN 3.7   Recent Labs  Basename 09/06/10 1527   LIPASE 1467*   AMYLASE --    Cardiac Enzymes: Trop: POC troponin negative    Imaging results:  CXR: no cardiopulmonary abnormality  Other results: EKG: NSR without e/o ischemia  Assessment & Plan by Problem: #Chest Pain Patient has an extensive cardiac history including CABG and multiple stents as recently as 2009. Patient reports his chest pain is similar to prior cardiac-related chest pain, however it is not relieved by nitroglycerin at this time. There is some concern for ACS given his history, however his initial troponin was negative and his EKG was unremarkable. The patient was seen by cardiology in the emergency room, and the cardiologist felt that his chest pain was likely noncardiac. Nonetheless possible etiologies include ACS, stable angina, GI etiology (pancreatitis, GERD), or anxiety. Given the patient's impressive lipase, pancreatitis cannot be ruled out and should be further  evaluated. In the meantime we will continue to rule this patient out for cardiac pathology, but there is no plan for urgent catheterization at this point. --Cardiac enzymes x3 --Pain control --Appreciate cardiology recommendations --Plavix --Aspirin  #Elevated Lipase This patient presents with an elevated lipase and some mild umbilical abdominal pain. He is nontender on exam, and does not have classic biliary colic or recent excessive alcohol ingestion. Nonetheless, pancreatitis needs to be worked up and this patient will get an abdominal ultrasound. Depending on those results the GI service may be consulted. This increased dose may be chronic in nature given his history of alcohol abuse, especially given the fact that he does not appear to have acute pancreatitis at this time clinically. We will trend his lab values and followup with imaging. --Abdominal ultrasound --Repeat lipase --N.p.o.  #Dark stools Patient  reports an unspecified history of dark, black stools. He denies any bright red blood per rectum, diarrhea, or vomiting. However, this patient's hemoglobin is decreased from his baseline and GI bleed is a possibility. Given his history of alcohol abuse, he could have an upper GI bleed such as gastritis or PUD, or he could have a lower GI bleed related to a unknown cause. It is difficult to say at this time if his melanotic stools are related to his elevated lipase. We will get Hemoccult cards and continue to trend his hemoglobin. --hemoccult x 3  #Hypertension The patient will be maintained on his home regimen of metoprolol and lisinopril  #DM This patient is non-insulin-dependent home on metformin and will be started on sliding-scale insulin while he is an inpatient.  #Hyperlipidemia Continue WelChol, Crestor, zetia, TriCor  R2/3______________________________      R1________________________________  ATTENDING: I performed and/or observed a history and physical examination of  the patient.  I discussed the case with the residents as noted and reviewed the residents' notes.  I agree with the findings and plan--please refer to the attending physician note for more details.  Signature________________________________  Printed Name_____________________________

## 2010-09-06 NOTE — Telephone Encounter (Signed)
RN received call directly from [place]; Pt's wife states Pt is having chest pain; clammy, nauseated and has a headache. Pt has taken 3 Nitroglycerin tablets. RN immediately advised Pt's wife to hang up with me and dial 911; advised that RN will alert our team at [facility] ER. Pt's wife verbalizes understanding. Cardmaster: Trish notified.

## 2010-09-06 NOTE — Telephone Encounter (Signed)
Per pt wife calling . Chest pain. Pt took 3 nitro. Head hurting, pulse 68.

## 2010-09-07 ENCOUNTER — Inpatient Hospital Stay (HOSPITAL_COMMUNITY): Payer: Medicare Other

## 2010-09-07 DIAGNOSIS — R079 Chest pain, unspecified: Secondary | ICD-10-CM

## 2010-09-07 LAB — CBC
MCH: 30.2 pg (ref 26.0–34.0)
MCV: 88.4 fL (ref 78.0–100.0)
Platelets: 198 10*3/uL (ref 150–400)
RDW: 12.9 % (ref 11.5–15.5)
WBC: 4.8 10*3/uL (ref 4.0–10.5)

## 2010-09-07 LAB — HEMOGLOBIN A1C
Hgb A1c MFr Bld: 7.1 % — ABNORMAL HIGH (ref <5.7)
Mean Plasma Glucose: 157 mg/dL — ABNORMAL HIGH (ref <117)

## 2010-09-07 LAB — COMPREHENSIVE METABOLIC PANEL
BUN: 19 mg/dL (ref 6–23)
CO2: 26 mEq/L (ref 19–32)
Chloride: 106 mEq/L (ref 96–112)
Creatinine, Ser: 0.96 mg/dL (ref 0.50–1.35)
GFR calc non Af Amer: >60 mL/min (ref >60)
Total Bilirubin: 0.6 mg/dL (ref 0.3–1.2)

## 2010-09-07 LAB — PROTIME-INR
INR: 1 (ref 0.00–1.49)
Prothrombin Time: 13.4 seconds (ref 11.6–15.2)

## 2010-09-07 LAB — GLUCOSE, CAPILLARY
Glucose-Capillary: 110 mg/dL — ABNORMAL HIGH (ref 70–99)
Glucose-Capillary: 129 mg/dL — ABNORMAL HIGH (ref 70–99)
Glucose-Capillary: 116 mg/dL — ABNORMAL HIGH (ref 70–99)

## 2010-09-07 LAB — LIPID PANEL
Cholesterol: 121 mg/dL (ref 0–200)
Triglycerides: 190 mg/dL — ABNORMAL HIGH (ref <150)

## 2010-09-07 LAB — CARDIAC PANEL(CRET KIN+CKTOT+MB+TROPI)
Relative Index: 1.6 (ref 0.0–2.5)
Total CK: 206 U/L (ref 7–232)
Total CK: 182 U/L (ref 7–232)
Troponin I: <0.3 ng/mL (ref <0.30)

## 2010-09-07 LAB — AMYLASE: Amylase: 87 U/L (ref 0–105)

## 2010-09-08 DIAGNOSIS — I2510 Atherosclerotic heart disease of native coronary artery without angina pectoris: Secondary | ICD-10-CM

## 2010-09-08 LAB — COMPREHENSIVE METABOLIC PANEL
AST: 29 U/L (ref 0–37)
CO2: 24 mEq/L (ref 19–32)
Chloride: 107 mEq/L (ref 96–112)
Creatinine, Ser: 0.97 mg/dL (ref 0.50–1.35)
GFR calc Af Amer: >60 mL/min (ref >60)
GFR calc non Af Amer: >60 mL/min (ref >60)
Glucose, Bld: 110 mg/dL — ABNORMAL HIGH (ref 70–99)
Total Bilirubin: 0.4 mg/dL (ref 0.3–1.2)

## 2010-09-08 LAB — GLUCOSE, CAPILLARY
Glucose-Capillary: 118 mg/dL — ABNORMAL HIGH (ref 70–99)
Glucose-Capillary: 139 mg/dL — ABNORMAL HIGH (ref 70–99)
Glucose-Capillary: 195 mg/dL — ABNORMAL HIGH (ref 70–99)

## 2010-09-08 NOTE — Consult Note (Signed)
NAMEMarland Kitchen  ASBURY, HAIR NO.:  [id number]  MEDICAL RECORD NO.:  [id number]  LOCATION:  3734                         FACILITY:  [facility]  PHYSICIAN:  Vesta Mixer, M.D. DATE OF BIRTH:  [birth year]  DATE OF CONSULTATION:  09/06/2010 DATE OF DISCHARGE:                                CONSULTATION   [name] is a 55 year old gentleman with a history of coronary artery disease.  He is status post coronary artery bypass grafting.  He was seen in the ER for abdominal pain/chest pain.  The patient has an extensive history of coronary artery disease.  He has had coronary artery bypass grafting.  His last cardiac catheterization was in 2009.  He was found to have a patent left internal mammary artery to the LAD.  His distal LAD does have some moderate stenosis.  His saphenous vein grafts are occluded.  The native circumflex artery has moderate diffuse disease.  The right coronary artery is moderate sized and has a 70% mid stenosis.  The patient has been treated medically. His left ventricular function has been normal with an EF of 60%.  The patient now presents with a 34-month history of intermittent chest pain.  He points to his upper abdomen and lower chest.  There is no radiation.  The pain is associated with some shortness breath.  It is not necessarily brought on by exercise.  He walks every morning and does not necessarily have pain.  He does very strain his exercise, he will have some discomfort.  It is not associated with syncope or presyncope. It is not associated with eating, drinking, change of position, or taking a deep breath.  The patient presents to the ER.  His pain today lasted for several hours and was not relieved with sublingual nitroglycerin.  His current medications include: 1. Clonazepam 1.5 mg every 8 hours. 2. Clopidogrel 75 mg a day. 3. Crestor 40 mg a day. 4. Lisinopril 30 mg a day. 5. Metformin 1.5 g a day. 6. Metoprolol XL 150 mg once a  day. 7. Nexium 40 mg a day. 8. Nitroglycerin 0.4 mg a day. 9. TriCor 145 mg a day. 10.Zetia 10 mg a day. 11.Aspirin 325 mg a day.  He has no known drug allergies.  PAST MEDICAL HISTORY: 1. History of coronary artery disease.  He is status post coronary     artery bypass grafting. 2. History of alcohol abuse. 3. History of diabetes mellitus. 4. Hypertension. 5. Dyslipidemia. 6. Chronic back pain. 7. History of gout.  SOCIAL HISTORY:  The patient does not smoke.  He drinks alcohol.  He used to work in [employer], is disabled since his bypass grafting.  FAMILY HISTORY:  Positive for cardiac disease in both sides of family.  REVIEW OF SYSTEMS:  As noted in the HPI.  He complains of dark stool yesterday, but no evidence of blood.  He also complains of cold sweats recently.  He complains of not feeling well for the past several months. His appetite has remained good.  He denies any heat or cold intolerance. He denies any weight gain or weight loss.  All other systems were reviewed and are negative.  PHYSICAL EXAMINATION:  GENERAL:  He is a middle-aged gentleman in no acute distress.  He is alert and oriented x3 and his mood and affect are normal. VITAL SIGNS:  His blood pressure is 109/64.  His heart rate is 60. HEENT:  2+ carotids.  He has no bruits, no JVD, no thyromegaly.  His mucous membranes are moist.  His sclerae are nonicteric. LUNGS:  Clear. BACK:  Nontender. HEART:  Regular rate.  S1 and S2.  He has no murmurs.  His PMI is nondisplaced. ABDOMEN:  Good bowel sounds and is nontender. EXTREMITIES:  He has no clubbing, cyanosis, or edema. NEURO:  Nonfocal.  His gait was not assessed.  His EKG reveals normal sinus rhythm.  He has no ST-T wave changes.  LABORATORY DATA:  His cardiac enzymes are pending.  His white blood cell count is 6.0, hemoglobin is 12.3, and hematocrit 37.3.  His sodium is 138, potassium is 4.5, chloride is 105, CO2 is 24, glucose is 115, BUN is  24, creatinine is 1.18.  His SGOT is 183.  His SGPT is 107.  His lipase was 1467.  His troponin is 0.00.  IMPRESSION AND PLAN:  Abdominal pain/chest pain.  I suspect that this is noncardiac in etiology.  His lipase and liver function tests are markedly elevated.  I suspect that he may have pancreatitis.  We will have the Internal Medicine doctors see him and he will probably need a GI consult.  At this point, he remains stable.  If his cardiac enzymes are negative, then he should be clear for any needed procedures or operations.     Vesta Mixer, M.D.     PJN/MEDQ  D:  09/06/2010  T:  09/07/2010  Job:  [id number]  cc:   [name], MD, [facility]  Electronically Signed by Kristeen Miss M.D. on 09/08/2010 05:41:02 PM

## 2010-09-09 DIAGNOSIS — I200 Unstable angina: Secondary | ICD-10-CM

## 2010-09-09 LAB — BASIC METABOLIC PANEL
CO2: 30 mEq/L (ref 19–32)
Chloride: 106 mEq/L (ref 96–112)
Sodium: 141 mEq/L (ref 135–145)

## 2010-09-09 LAB — CBC
Hemoglobin: 13.3 g/dL (ref 13.0–17.0)
MCV: 89.4 fL (ref 78.0–100.0)
Platelets: 187 10*3/uL (ref 150–400)
RBC: 4.45 MIL/uL (ref 4.22–5.81)
WBC: 5.6 10*3/uL (ref 4.0–10.5)

## 2010-09-09 LAB — GLUCOSE, CAPILLARY
Glucose-Capillary: 114 mg/dL — ABNORMAL HIGH (ref 70–99)
Glucose-Capillary: 116 mg/dL — ABNORMAL HIGH (ref 70–99)

## 2010-09-17 NOTE — Cardiovascular Report (Signed)
NAMECHANTZ, [name] NO.:  [id number]  MEDICAL RECORD NO.:  [id number]  LOCATION:                                 FACILITY:  PHYSICIAN:  Lorine Bears, MD     DATE OF BIRTH:  08/23/1955  DATE OF PROCEDURE: DATE OF DISCHARGE:                           CARDIAC CATHETERIZATION   PRIMARY CARDIOLOGIST:  Theron Arista C. Eden Emms, MD, [employer]  PROCEDURES PERFORMED: 1. Left heart catheterization. 2. Coronary angiography. 3. LIMA angiography. 4. Left ventricular angiography. 5. Pressure wire interrogation of the left circumflex followed by     angioplasty and drug-eluting stent placement to the mid segment.  INDICATIONS AND CLINICAL HISTORY:  This is a 55 year old gentleman with known history of coronary artery disease status post coronary artery bypass graft surgery.  He also had drug-eluting stent placement to the native left circumflex as well as the right coronary artery.  Most recent cardiac catheterization in 2009 showed an occluded vein graft with patent LIMA to LAD and moderate disease otherwise with patent stents.  He presented with symptoms of chest pain, which has been progressive.  He ruled out for myocardial infarction.  Initially, his lipase was elevated, but that turned out to be a lab error.  The patient was referred for cardiac catheterization and possible coronary intervention.  Risks, benefits, and alternatives were discussed with the patient.  STUDY DETAILS:  A standard informed consent was obtained.  The right femoral area was prepped in a sterile fashion.  It was anesthetized with 1% lidocaine.  A 5-French sheath was placed in the right femoral artery after an anterior puncture.  Coronary angiography was performed with a JL-4, JR-4, pigtail catheter, and the LIMA catheter.  A long exchange wire was used to engage the LIMA catheter.  All catheter exchanges were done over the wire.  The patient tolerated the procedure well with no immediate  complications.  INTERVENTIONAL PROCEDURE NOTE:  The patient was found to have significant disease in the mid left circumflex and borderline lesion in the proximal ostial left circumflex.  Thus, I decided to proceed with an intervention and I wanted to evaluate the physiologic significance of the proximal lesion as well.  Thus, I decided to proceed with pressure wire interrogation.  The 5-French sheath was exchanged to a 6-French sheath.  IV bivalirudin was initiated with therapeutic ACT.  A pressure PrimeWire was used in the standard fashion.  IV adenosine at a dose of 140 mcg/kg/minute was initiated.  The wire was placed distal to the mid lesion.  The FFR ratio was 0.65.  I then proceeded with angioplasty in the mid circumflex distal to a previously placed stent.  I used a 2.0 x 12 mm Emerge balloon and dilated the lesion to 8 atmospheres.  I then placed a 2.5 x 12 mm Resolute drug-eluting stent, which was deployed to 10 atmospheres.  This was postdilated with an  Quantum balloon to 12 atmospheres in the mid segment and 14 atmospheres proximally. Angiography showed excellent results.  Then, I performed a pressure wire interrogation after.  The original wire did not function properly and thus it was exchanged to a new wire.  FFR ratio was  0.85.  I performed a pullback in the newly stented area and there was no gradient at all. This basically reflects nonphysiologic significance of the reflex and nonobstructive physiology of the proximal lesion in the left circumflex. The wire was removed.  The catheter was removed.  The sheath was removed and the site was closed with a Perclose device.  There was no immediate complications.  STUDY FINDINGS:  Hemodynamic findings:  Left ventricular pressure is 113/3 with a left ventricular end-diastolic pressure of 17 mmHg.  Aortic pressure is 116/69 with a mean pressure of 89 mmHg.  Left ventricular angiography:  This showed normal LV systolic  function and wall motion with an estimated ejection fraction of 55%-60%.  Coronary angiography: 1. Left main coronary artery:  The vessel is normal in size and free     of significant disease. 2. Left circumflex artery:  The vessel is normal in size and     codominant.  A stent is noted in the mid segment and is patent with     only 10% restenosis.  There is a lesion in the ostial and proximal     segment, which is estimated to be 50%.  In the mid segment distal     to the stented area, there is an 80% stenosis.  OM-1 is a small-     sized branch with diffuse 20% disease.  OM-2 is also small in size     with diffuse 20% disease.  OM-3 is normal in size and free of     significant disease.  The AV groove artery is relatively small, but     does provide two posterolateral branches. 3. Left anterior descending artery:  The vessel is normal in size.     There is a diffuse 90% disease proximally with competitive flow     noted in the mid and distal segment. 4. Right coronary artery:  The vessel is normal in size and     codominant.  There is a stent noted in the distal segment which is     patent with only 20% in-stent restenosis.  In the proximal right     coronary artery 50% lesion is noted which actually appears to be     better than his previous cardiac catheterization.  The rest of the     right coronary artery is free of significant disease. 5. LIMA to LAD:  The graft is patent without significant disease in     the graft itself.  In the native LAD, there is a 40% disease distal     to then anastomosis. 6. SVG grafts were not engaged and are known to be occluded from     before.  STUDY CONCLUSION: 1. Significant underlying three-vessel coronary artery disease with     patent LIMA to LAD and patent stents in the mid left circumflex and     distal right coronary artery. 2. Significant mid left circumflex stenosis, which is much worse than     his previous cardiac catheterization.   There is a moderate stenosis     in the ostial proximal left circumflex. 3. Normal LV systolic function with mildly elevated left ventricular     pressure. 4. Successful FFR guided PCI and drug-eluting stent placement to the     mid left circumflex.  Initial FFR ratio was 0.67, improved to 0.85     without any gradient across the stented area.  The 0.85 ratio     reflects the lesion  in the ostial proximal left circumflex, which     was left to be treated medically.  RECOMMENDATIONS:  Aggressive medical therapy is recommended.  Continue dual-antiplatelet therapy indefinitely if possible.     Lorine Bears, MD    MA/MEDQ  D:  09/08/2010  T:  09/08/2010  Job:  [id number]  cc:   [name], MD, [facility]  Electronically Signed by Lorine Bears MD on 09/17/2010 03:51:11 PM

## 2010-09-21 ENCOUNTER — Encounter: Payer: Self-pay | Admitting: Cardiovascular Disease

## 2010-09-21 NOTE — Discharge Summary (Signed)
[name], [name] NO.:  [id number]  MEDICAL RECORD NO.:  [id number]  LOCATION:  6523                         FACILITY:  [facility]  PHYSICIAN:  Ileana Roup, M.D.  DATE OF BIRTH:  15-Sep-1955  DATE OF ADMISSION:  09/06/2010 DATE OF DISCHARGE:  09/09/2010                              DISCHARGE SUMMARY   DISCHARGE DIAGNOSES: 1. Unstable angina. 2. Status post drug-eluting stent placement. 3. Coronary artery disease. 4. Hypertension. 5. Diabetes mellitus. 6. Hyperlipidemia.  DISCHARGE MEDICATIONS: 1. Aspirin 325 one tablet by mouth daily. 2. Clonazepam 1 mg 1.5 tablets by mouth daily as needed for anxiety. 3. Crestor 40 mg 1 tablet by mouth daily at bedtime. 4. Lisinopril 30 mg 1 tablet by mouth daily. 5. Metformin 500 mg 3 tablets by mouth daily. 6. Metoprolol tartrate 50 mg 3 tablets by mouth daily. 7. Nitroglycerin sublingual 0.4 mg 1 tablet by mouth daily as needed     for chest pain. 8. Plavix 75 mg 1 tablet by mouth daily. 9. TriCor 145 one tablet by mouth daily. 10.Zetia 10 mg 1 tablet by mouth daily.  DISPOSITION AND FOLLOWUP:  [name] is medically stable for safe discharge to home.  He is scheduled to follow up with his PCP, Dr. Burnell Blanks on September 28, 2010, at 2:15 p.m.  Her phone number is [phone].  He is also scheduled to follow up with Dr. Eden Emms on October 06, 2010, at 11:15.  His phone number is [phone].  PROCEDURES DURING HOSPITALIZATION: 1. Chest x-ray done on September 06, 2010, which revealed no evidence for     acute cardiopulmonary abnormality. 2. Abdominal ultrasound on September 07, 2010, revealed cholelithiasis     without evidence for cholecystitis, probable diffuse fatty change     within the liver, incomplete visualization of the pancreas due to     overlying gas. 3. Cardiac catheterization on September 08, 2010, which revealed     significant underlying three-vessel coronary artery disease with     patent LIMA  to LAD and patent stents in the mid left circumflex and     distal right coronary artery, significant mild left circumflex     stenosis which is much worse in his previous cardiac     catheterization.  There is moderate stenosis in the ostial proximal     left circumflex.  Normal left ventricular systolic function was     mildly elevated left ventricular pressure.  Successful FFR-guided     PCI and drug-eluting stent placement to the mid left circumflex.  CONSULTATIONS DURING HOSPITALIZATION:   Cardiology.  ADMITTING HISTORY AND PHYSICAL AND LABORATORY DATA:  The patient is a 55- year-old man with extensive coronary artery disease including CABG in 2004 and 2 stents in the 1990s, diabetes, hypertension and hyperlipidemia who presents with a 1-2 months history of intermittent chest pain that was worse the morning of admission.  The patient has had chest pain for the past 2 months that is typically relieved with one to two nitroglycerin and rest and feels like an elephant is sitting on his chest with intense pressure on occasion.  His pain has radiated to his left jaw  and arm on the morning of admission.  However, the patient experienced similar chest pain that was not relieved with 3 nitroglycerin.  He also had nausea, diaphoresis and a headache following the third nitroglycerin.  He denies any vomiting or diarrhea.  At the time of presentation in the emergency room, the patient was not in any pain.  He had just received morphine.  During a similar 1-2 month time period, if the patient has also experienced umbilical abdominal pain the pain is nonradiating and harder for the patient to describe.  He has had normal bowel movements, though does describe recent dark stools without any bright blood per rectum.  He has an extensive alcohol history, having drank roughly a 12-pack per day for nearly 40 years before quitting 8-9 months ago.  He denies any exacerbation of the pain with food and  was not in any pain at the time of examination.  PHYSICAL EXAMINATION:  VITALS AT ADMISSION:  Temperature 97.8, blood pressure 106/49, heart rate 65, respiratory rate 20 and O2 saturation 99% on room air. GENERAL:  No apparent distress.  Alert and oriented x3, pleasant, conversant and cooperative to exam. HEENT:  Head is atraumatic.  Anicteric sclerae.  Conjunctivae without pallor or injection. RESPIRATORY:  Lungs are clear to auscultation bilaterally with good air movement.  No wheezes, rhonchi or rubs. CARDIOVASCULAR:  Regular rate and rhythm, clear S1 and S2.  No murmurs, rubs or gallops. ABDOMEN:  Soft, nontender and nondistended. EXTREMITIES:  Warm and dry.  Peripheral pulses equal intact and 2+ globally.  No clubbing or cyanosis.  No edema in bilateral lower extremities. SKIN:  Warm and dry with normal turgor.  No rashes or abdominal lesions observed. NEUROLOGICAL:  Cranial nerves II to XII grossly intact.  Muscle strength 5/5 in bilateral upper and lower extremity.  Sensation grossly intact. No focal deficits.  LABORATORY DATA AT ADMISSION:  White blood count 6, hemoglobin 12.3, hematocrit 37.3, platelets 183, MCV 87.4.  Sodium 138, potassium 4.5, chloride 105, CO2 24, glucose 115, BUN 24, creatinine 1.18, calcium 9.3, AST 183, ALT 107, alk phos 40, total bilirubin 0.5, total protein 7.4, albumin 3.7, lipase 1467.  HOSPITAL COURSE: 1. Chest pain found to be secondary to worsening unstable angina as     chest pain did not improve after nitroglycerin.  He was ruled out     for myocardial infarction by negative cardiac enzymes as well as no     EKG changes.  We considered GI etiologies as well as anxiety, but     given the patient's past medical history and symptoms, he was taken     to cardiac catheterization subsequently treated by placement of     drug-eluting stent in the mid to left circumflex artery.  He had no     complications during or after the procedure.  Renal  function did     not deteriorate post catheterization.  Upon discharge, I stressed     the importance of compliance of Plavix and aspirin. 2. Abdominal pain was likely secondary to constipation given that the     patient had no bowel movement for 3 days prior to the day of     discharge.  On the day of discharge, he had a normal bowel movement     and reports relief of abdominal pain.  At admission, he had an     elevated lipase of 1467, but given that repeat lipase was 56 and     amylase was  87.  The initial finding was likely a lab error.  He     also reported dark stools recently.  Hemoccult studies were     ordered, but given that the patient had no bowel movement until     just before discharge.  Studies were never completed.  This should     be followed up during his outpatient followup. 3. Hypertension.  The patient's blood pressure was stable and well     controlled during hospitalization.  He will continue his current     outpatient regimen of lisinopril and metoprolol. 4. Diabetes mellitus.  The patient's hemoglobin A1C was found to be 7.1.     He is currently on metformin outpatient, was treated with sliding     scale insulin during hospitalization.  During hospitalization,     blood sugars were reasonably controlled.  He should follow up with     his PCP regarding his diabetes mellitus as his anti-glycemic     regimen may require modification. 5. Hyperlipidemia.  Lipid panel drawn during hospitalization revealed     total cholesterol of 121, triglycerides of 190, HDL of 28, LDL 55     and VLDL 38.  He will continue his outpatient regimen of TriCor,     Crestor and Zetia.  I also encouraged diet modification at     discharge.  DISCHARGE VITALS:  Temperature 97.7, pulse 57, respirations 15, blood pressure 111/77 and O2 saturation 96% on room air.  DISCHARGE LABORATORY DATA:  Sodium 141, potassium 4.4, chloride 106, CO2 30, glucose 103, BUN 13, creatinine 1.02, calcium 9.5.   White blood count 5.6, hemoglobin 13.3, hematocrit 39.8, MCV 89.4 and platelet count 187.    ______________________________ Vernice Jefferson, MD   ______________________________ Ileana Roup, M.D.    NK/MEDQ  D:  09/09/2010  T:  09/10/2010  Job:  [id number]  Electronically Signed by Vernice Jefferson MD on 09/15/2010 05:08:07 PM Electronically Signed by Margarito Liner M.D. on 09/21/2010 07:14:11 PM

## 2010-10-06 ENCOUNTER — Ambulatory Visit (INDEPENDENT_AMBULATORY_CARE_PROVIDER_SITE_OTHER): Payer: Medicare Other | Admitting: Cardiovascular Disease

## 2010-10-06 ENCOUNTER — Encounter: Payer: Self-pay | Admitting: Cardiovascular Disease

## 2010-10-06 DIAGNOSIS — E785 Hyperlipidemia, unspecified: Secondary | ICD-10-CM

## 2010-10-06 DIAGNOSIS — Z951 Presence of aortocoronary bypass graft: Secondary | ICD-10-CM

## 2010-10-06 DIAGNOSIS — I10 Essential (primary) hypertension: Secondary | ICD-10-CM

## 2010-10-06 MED ORDER — NITROGLYCERIN 0.4 MG SL SUBL: 0.4000 mg | SUBLINGUAL_TABLET | SUBLINGUAL | Status: DC | PRN

## 2010-10-06 NOTE — Assessment & Plan Note (Signed)
Relief of angina with recent stent to native circumflex  Continue medical Rx  Nitro called in

## 2010-10-06 NOTE — Assessment & Plan Note (Signed)
Cholesterol is at goal.  Continue current dose of statin and diet Rx.  No myalgias or side effects.  F/U  LFT's in 6 months. Lab Results  Component Value Date   LDLCALC 55 09/07/2010

## 2010-10-06 NOTE — Progress Notes (Signed)
[name] is seen today in F/U for Donald CAD. had coronary bypass surgery in 2004. In March of 2008 catheterization revealed occlusion of all of Donald vein grafts with a patent LIMA to the LAD. At that time Donald had stenting of the mid to distal native right coronary artery with a Taxus stent. Donald had a followup catheter in January of 2009 as part of the Peruses trial bulimic continue to be patent as did the stent in the RCA with no obstructive disease. Donald overall LV function is still normal. Donald is now on Social Security disability. 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 I did not give him disability but Donald appears to have had a period Donald is enjoying [name]. Donald even has taken Donald motorcycle out for a ride or 2. Donald is no llonger complaining of chest pain exertional dyspnea PND orthopnea palpitations or syncope. Donald's been compliant with Donald medications.  [name]. [name] was to move to [place] to be with Donald son [name] but may have to stay in [place] with [name] since Donald disability came through  Just D/C from hospital with angina  Had another stent to native circumflex Cath 09/05/10   1. Significant underlying three-vessel coronary artery disease with       patent LIMA to LAD and patent stents in the mid left circumflex and       distal right coronary artery.   2. Significant mid left circumflex stenosis, which is much worse than       Donald previous cardiac catheterization.  There is a moderate stenosis       in the ostial proximal left circumflex.   3. Normal LV systolic function with mildly elevated left ventricular       pressure.   4. Successful FFR guided PCI and drug-eluting stent placement to the       mid left circumflex.  Initial FFR ratio was 0.67, improved to 0.85       without any gradient across the  stented area.  The 0.85 ratio       reflects the lesion in the ostial proximal left circumflex, which       was left to be treated medically.   ROS: Denies fever, malais, weight loss, blurry vision, decreased visual acuity, cough, sputum, SOB, hemoptysis, pleuritic pain, palpitaitons, heartburn, abdominal pain, melena, lower extremity edema, claudication, or rash.  All other systems reviewed and negative  General: Affect appropriate Healthy:  appears stated age HEENT: normal Neck supple with no adenopathy JVP normal no bruits no thyromegaly Lungs clear with no wheezing and good diaphragmatic motion Heart:  S1/S2 no murmur,rub, gallop or click PMI normal Abdomen: benighn, BS positve, no tenderness, no AAA no bruit.  No HSM or HJR Distal pulses intact with no bruits No edema Neuro non-focal Skin warm and dry No muscular weakness   Current Outpatient Prescriptions  Medication Sig Dispense Refill  . aspirin 325 MG tablet Take 325 mg by mouth daily.        . budesonide (RHINOCORT AQUA) 32 MCG/ACT nasal spray Place 1 spray into the nose daily. Use as directed       . clonazePAM (KLONOPIN) 1 MG tablet Take 1.5 mg by mouth as needed.        [name]  clopidogrel (PLAVIX) 75 MG tablet Take 75 mg by mouth daily.        [name] esomeprazole (NEXIUM) 20 MG capsule Take 20 mg by mouth daily before breakfast.        . ezetimibe-simvastatin (VYTORIN) 10-40 MG per tablet Take 1 tablet by mouth daily.        . fenofibrate (TRICOR) 145 MG tablet Take 145 mg by mouth daily.        . metFORMIN (GLUMETZA) 500 MG (MOD) 24 hr tablet Take 1,500 mg by mouth daily with breakfast.        . metoprolol (TOPROL-XL) 200 MG 24 hr tablet Take 200 mg by mouth daily.        . nitroGLYCERIN (NITROSTAT) 0.4 MG SL tablet Place 0.4 mg under the tongue every 5 (five) minutes as needed.        [name] oxyCODONE-acetaminophen (PERCOCET) 5-325 MG per tablet Take 1 tablet by mouth 3 (three) times daily.        . Tetrahydrozoline HCl (EYE  DROPS OP) Apply to eye. Use as directed         Allergies  Review of patient's allergies indicates no known allergies.  Electrocardiogram:  Assessment and Plan

## 2010-10-06 NOTE — Assessment & Plan Note (Signed)
Well controlled.  Continue current medications and low sodium Dash type diet.    

## 2010-10-06 NOTE — Patient Instructions (Addendum)
Your physician recommends that you schedule a follow-up appointment in: 6 MONTHS WITH DR NISHAN  Your physician recommends that you continue on your current medications as directed. Please refer to the Current Medication list given to you today. 

## 2010-10-14 ENCOUNTER — Telehealth: Payer: Self-pay | Admitting: Cardiovascular Disease

## 2010-10-14 NOTE — Telephone Encounter (Signed)
Per [place] pt has acute dental infection. Need to extraction tooth today.

## 2010-10-14 NOTE — Telephone Encounter (Signed)
[place]" calling from pt's dental office stating [name] desparately needs a tooth extracted as full of infection and face swelling--advised Donald Brennen recently had stent placed and is on pradaxa --and cannot come off pradaxa--per "[place]"--they are aware of this and need to proceed--spoke with d mathis who agrees pt needs to have this done and will copy to dr nishan--nt

## 2011-05-12 ENCOUNTER — Emergency Department (HOSPITAL_COMMUNITY): Payer: Medicare Other

## 2011-05-12 ENCOUNTER — Inpatient Hospital Stay (HOSPITAL_COMMUNITY)
Admission: EM | Admit: 2011-05-12 | Discharge: 2011-05-13 | DRG: 087 | Disposition: A | Discharge status: Home / Self Care | Payer: Medicare Other | Attending: Neurological Surgery | Admitting: Neurological Surgery

## 2011-05-12 ENCOUNTER — Other Ambulatory Visit: Payer: Self-pay

## 2011-05-12 ENCOUNTER — Encounter (HOSPITAL_COMMUNITY): Payer: Self-pay | Admitting: *Deleted

## 2011-05-12 DIAGNOSIS — F10929 Alcohol use, unspecified with intoxication, unspecified: Secondary | ICD-10-CM

## 2011-05-12 DIAGNOSIS — Y92009 Unspecified place in unspecified non-institutional (private) residence as the place of occurrence of the external cause: Secondary | ICD-10-CM

## 2011-05-12 DIAGNOSIS — E785 Hyperlipidemia, unspecified: Secondary | ICD-10-CM | POA: Diagnosis present

## 2011-05-12 DIAGNOSIS — S065X9A Traumatic subdural hemorrhage with loss of consciousness of unspecified duration, initial encounter: Secondary | ICD-10-CM

## 2011-05-12 DIAGNOSIS — I609 Nontraumatic subarachnoid hemorrhage, unspecified: Secondary | ICD-10-CM

## 2011-05-12 DIAGNOSIS — I252 Old myocardial infarction: Secondary | ICD-10-CM

## 2011-05-12 DIAGNOSIS — G8929 Other chronic pain: Secondary | ICD-10-CM | POA: Diagnosis present

## 2011-05-12 DIAGNOSIS — I10 Essential (primary) hypertension: Secondary | ICD-10-CM | POA: Diagnosis present

## 2011-05-12 DIAGNOSIS — S065XAA Traumatic subdural hemorrhage with loss of consciousness status unknown, initial encounter: Secondary | ICD-10-CM | POA: Diagnosis present

## 2011-05-12 DIAGNOSIS — S0990XA Unspecified injury of head, initial encounter: Secondary | ICD-10-CM

## 2011-05-12 DIAGNOSIS — E119 Type 2 diabetes mellitus without complications: Secondary | ICD-10-CM | POA: Diagnosis present

## 2011-05-12 DIAGNOSIS — K219 Gastro-esophageal reflux disease without esophagitis: Secondary | ICD-10-CM | POA: Diagnosis present

## 2011-05-12 DIAGNOSIS — F3289 Other specified depressive episodes: Secondary | ICD-10-CM | POA: Diagnosis present

## 2011-05-12 DIAGNOSIS — F329 Major depressive disorder, single episode, unspecified: Secondary | ICD-10-CM | POA: Diagnosis present

## 2011-05-12 DIAGNOSIS — S066X9A Traumatic subarachnoid hemorrhage with loss of consciousness of unspecified duration, initial encounter: Principal | ICD-10-CM | POA: Diagnosis present

## 2011-05-12 DIAGNOSIS — I2510 Atherosclerotic heart disease of native coronary artery without angina pectoris: Secondary | ICD-10-CM | POA: Diagnosis present

## 2011-05-12 DIAGNOSIS — M549 Dorsalgia, unspecified: Secondary | ICD-10-CM | POA: Diagnosis present

## 2011-05-12 DIAGNOSIS — Z951 Presence of aortocoronary bypass graft: Secondary | ICD-10-CM

## 2011-05-12 DIAGNOSIS — S066XAA Traumatic subarachnoid hemorrhage with loss of consciousness status unknown, initial encounter: Principal | ICD-10-CM | POA: Diagnosis present

## 2011-05-12 LAB — GLUCOSE, CAPILLARY: Glucose-Capillary: 144 mg/dL — ABNORMAL HIGH (ref 70–99)

## 2011-05-12 LAB — POCT I-STAT, CHEM 8
Calcium, Ion: 1.17 mmol/L (ref 1.12–1.32)
Chloride: 104 mEq/L (ref 96–112)
HCT: 50 % (ref 39.0–52.0)
TCO2: 23 mmol/L (ref 0–100)

## 2011-05-12 LAB — POCT I-STAT TROPONIN I: Troponin i, poc: 0 ng/mL (ref 0.00–0.08)

## 2011-05-12 LAB — PROTIME-INR: INR: 0.96 (ref 0.00–1.49)

## 2011-05-12 LAB — APTT: aPTT: 23 seconds — ABNORMAL LOW (ref 24–37)

## 2011-05-12 MED ORDER — ONDANSETRON HCL 4 MG/2ML IJ SOLN
INTRAMUSCULAR | Status: AC
  Filled 2011-05-12: qty 2

## 2011-05-12 MED ORDER — ACETAMINOPHEN 325 MG PO TABS
ORAL_TABLET | ORAL | Status: AC
  Administered 2011-05-12: 07:00:00
  Filled 2011-05-12: qty 1

## 2011-05-12 MED ORDER — ACETAMINOPHEN 325 MG PO TABS
ORAL_TABLET | ORAL | Status: AC
  Filled 2011-05-12: qty 2

## 2011-05-12 MED ORDER — CLONAZEPAM 0.5 MG PO TABS
1.5000 mg | ORAL_TABLET | Freq: Every day | ORAL | Status: DC | PRN
  Administered 2011-05-12: 1.5 mg via ORAL
  Filled 2011-05-12: qty 3

## 2011-05-12 MED ORDER — ONDANSETRON HCL 4 MG/2ML IJ SOLN
4.0000 mg | Freq: Four times a day (QID) | INTRAMUSCULAR | Status: DC | PRN
Start: 2011-05-12 — End: 2011-05-13
  Administered 2011-05-12: 4 mg via INTRAVENOUS
  Filled 2011-05-12: qty 2

## 2011-05-12 MED ORDER — NITROGLYCERIN 0.4 MG SL SUBL: 0.4000 mg | SUBLINGUAL_TABLET | SUBLINGUAL | Status: DC | PRN

## 2011-05-12 MED ORDER — TETRAHYDROZOLINE HCL 0.05 % OP SOLN
1.0000 [drp] | Freq: Two times a day (BID) | OPHTHALMIC | Status: DC
  Administered 2011-05-12 – 2011-05-13 (×3): 1 [drp] via OPHTHALMIC
  Filled 2011-05-12: qty 15

## 2011-05-12 MED ORDER — CLONAZEPAM 1 MG PO TABS
1.5000 mg | ORAL_TABLET | Freq: Three times a day (TID) | ORAL | Status: DC | PRN
  Administered 2011-05-12: 1.5 mg via ORAL
  Filled 2011-05-12: qty 2
  Filled 2011-05-12: qty 3

## 2011-05-12 MED ORDER — PANTOPRAZOLE SODIUM 40 MG PO TBEC
40.0000 mg | DELAYED_RELEASE_TABLET | Freq: Every day | ORAL | Status: DC
  Administered 2011-05-12: 40 mg via ORAL
  Filled 2011-05-12: qty 1

## 2011-05-12 MED ORDER — ACETAMINOPHEN 325 MG PO TABS
975.0000 mg | ORAL_TABLET | Freq: Once | ORAL | Status: AC
  Administered 2011-05-12: 975 mg via ORAL

## 2011-05-12 MED ORDER — ACETAMINOPHEN 650 MG RE SUPP: 650.0000 mg | RECTAL | Status: DC | PRN

## 2011-05-12 MED ORDER — ONDANSETRON HCL 4 MG/2ML IJ SOLN
4.0000 mg | Freq: Once | INTRAMUSCULAR | Status: AC
  Administered 2011-05-12: 4 mg via INTRAVENOUS

## 2011-05-12 MED ORDER — SENNOSIDES-DOCUSATE SODIUM 8.6-50 MG PO TABS
1.0000 | ORAL_TABLET | Freq: Two times a day (BID) | ORAL | Status: DC
  Administered 2011-05-12 – 2011-05-13 (×2): 1 via ORAL
  Filled 2011-05-12 (×2): qty 1

## 2011-05-12 MED ORDER — ACETAMINOPHEN 325 MG PO TABS: 650.0000 mg | ORAL_TABLET | ORAL | Status: DC | PRN

## 2011-05-12 MED ORDER — OXYCODONE-ACETAMINOPHEN 5-325 MG PO TABS
1.0000 | ORAL_TABLET | ORAL | Status: DC | PRN
  Administered 2011-05-12 (×3): 2 via ORAL
  Administered 2011-05-13: 1 via ORAL
  Filled 2011-05-12 (×5): qty 2

## 2011-05-12 MED ORDER — HYDROMORPHONE HCL PF 1 MG/ML IJ SOLN: 0.5000 mg | INTRAMUSCULAR | Status: DC | PRN

## 2011-05-12 MED ORDER — METFORMIN HCL ER 750 MG PO TB24
1500.0000 mg | ORAL_TABLET | Freq: Every day | ORAL | Status: DC
  Administered 2011-05-12 – 2011-05-13 (×2): 1500 mg via ORAL
  Filled 2011-05-12 (×3): qty 2

## 2011-05-12 MED ORDER — FENOFIBRATE 54 MG PO TABS
54.0000 mg | ORAL_TABLET | Freq: Every day | ORAL | Status: DC
  Filled 2011-05-12 (×3): qty 1

## 2011-05-12 MED ORDER — METOPROLOL SUCCINATE ER 100 MG PO TB24
200.0000 mg | ORAL_TABLET | Freq: Every day | ORAL | Status: DC
  Administered 2011-05-12 – 2011-05-13 (×2): 200 mg via ORAL
  Filled 2011-05-12 (×3): qty 2

## 2011-05-12 MED ORDER — WHITE PETROLATUM GEL
Status: AC
  Administered 2011-05-12: 18:00:00
  Filled 2011-05-12: qty 5
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
## 2011-05-12 NOTE — ED Notes (Signed)
Dr. Yetta Barre nsurg present
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
## 2011-05-12 NOTE — ED Notes (Signed)
Patient transported to CT 

## 2011-05-12 NOTE — ED Notes (Signed)
MD at bedside to discuss plan of care
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
## 2011-05-12 NOTE — ED Notes (Addendum)
EDP into room, waiting on nsurg, c-collar removed by Dr. Manus Gunning, "OK to have PO fluids" per Dr. Manus Gunning. Family at [facility].

## 2011-05-12 NOTE — ED Notes (Signed)
Pt wanting to remove c collar and wanting to leave. Encouraged pt to lay back and wait on results. Cooperating at this time
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
## 2011-05-12 NOTE — Progress Notes (Signed)
UR COMPLETED  

## 2011-05-13 ENCOUNTER — Inpatient Hospital Stay (HOSPITAL_COMMUNITY): Payer: Medicare Other

## 2011-05-13 LAB — GLUCOSE, CAPILLARY
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
## 2011-07-05 ENCOUNTER — Telehealth: Payer: Self-pay | Admitting: Cardiovascular Disease

## 2011-07-05 NOTE — Telephone Encounter (Signed)
Patient is scheduled to have about 6 dental feeling today,the appointment is at 1:00 pm today, he would like to know if he needs to be of the plavix medication.  Patient and wife are aware that just for dental feeling patient does not need to hold the Plavix. Patient and wife verbalized understanding.

## 2011-07-05 NOTE — Telephone Encounter (Signed)
Please return call to patient at [phone] concerning 1pm dentist appointment.

## 2011-12-14 ENCOUNTER — Encounter: Payer: Self-pay | Admitting: Cardiovascular Disease

## 2011-12-14 ENCOUNTER — Ambulatory Visit (INDEPENDENT_AMBULATORY_CARE_PROVIDER_SITE_OTHER): Payer: Medicare Other | Admitting: Cardiovascular Disease

## 2011-12-14 VITALS — BP 151/98 | HR 71 | Ht 66.0 in | Wt 222.0 lb

## 2011-12-14 DIAGNOSIS — E785 Hyperlipidemia, unspecified: Secondary | ICD-10-CM

## 2011-12-14 DIAGNOSIS — I2510 Atherosclerotic heart disease of native coronary artery without angina pectoris: Secondary | ICD-10-CM

## 2011-12-14 DIAGNOSIS — E119 Type 2 diabetes mellitus without complications: Secondary | ICD-10-CM

## 2011-12-14 DIAGNOSIS — I10 Essential (primary) hypertension: Secondary | ICD-10-CM

## 2011-12-14 NOTE — Progress Notes (Signed)
Patient ID: [name], male   DOB: 1955-12-19, 56 y.o.   MRN: [id number] [name] is seen today in F/U for his CAD. had coronary bypass surgery in 2004. In March of 2008 catheterization revealed occlusion of all of his vein grafts with a patent LIMA to the LAD. At that time he had stenting of the mid to distal native right coronary artery with a Taxus stent. He had a followup catheter in January of 2009 as part of the Peruses trial bulimic continue to be patent as did the stent in the RCA with no obstructive disease. His overall LV function is still normal. He is now on Social Security disability. 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 I did not give him disability but he appears to have had a period he is enjoying his family time including playing with his grandchildren and nephews. He even has taken his motorcycle out for a ride or 2. He is no llonger complaining of chest pain exertional dyspnea PND orthopnea palpitations or syncope. He's been compliant with his medications.   Received another stent to circumflex last August  Cath 09/05/10  1. Significant underlying three-vessel coronary artery disease with  patent LIMA to LAD and patent stents in the mid left circumflex and  distal right coronary artery.  2. Significant mid left circumflex stenosis, which is much worse than  his previous cardiac catheterization. There is a moderate stenosis  in the ostial proximal left circumflex.  3. Normal LV systolic function with mildly elevated left ventricular  pressure.  4. Successful FFR guided PCI and drug-eluting stent placement to the  mid left circumflex. Initial FFR ratio was 0.67, improved to 0.85  without any gradient across the stented area. The 0.85 ratio  reflects the lesion in the ostial proximal left circumflex, which  was left to be treated medically.   Some functional dyspnea No chest pains  No nitro use.  Discussed weight  loss and low carb diet  ROS: Denies fever, malais, weight loss, blurry vision, decreased visual acuity, cough, sputum, SOB, hemoptysis, pleuritic pain, palpitaitons, heartburn, abdominal pain, melena, lower extremity edema, claudication, or rash.  All other systems reviewed and negative  General: Affect appropriate Overweight hispanic male HEENT: normal Neck supple with no adenopathy JVP normal no bruits no thyromegaly Lungs clear with no wheezing and good diaphragmatic motion Heart:  S1/S2 no murmur, no rub, gallop or click PMI normal Abdomen: benighn, BS positve, no tenderness, no AAA no bruit.  No HSM or HJR Distal pulses intact with no bruits No edema Neuro non-focal Skin warm and dry No muscular weakness   Current Outpatient Prescriptions  Medication Sig Dispense Refill  . aspirin 325 MG tablet Take 325 mg by mouth daily.        . budesonide (RHINOCORT AQUA) 32 MCG/ACT nasal spray Place 1 spray into the nose daily. Use as directed       . clonazePAM (KLONOPIN) 1 MG tablet Take 1.5 mg by mouth as needed.       . clopidogrel (PLAVIX) 75 MG tablet Take 75 mg by mouth daily.        [name] esomeprazole (NEXIUM) 40 MG capsule Take 40 mg by mouth daily before breakfast.      . ezetimibe (ZETIA) 10 MG tablet Take 10 mg by mouth daily.      . fenofibrate (TRICOR) 145 MG tablet Take 145 mg by mouth daily.        [name]  lisinopril (PRINIVIL,ZESTRIL) 40 MG tablet Take 40 mg by mouth daily.      . metFORMIN (GLUMETZA) 500 MG (MOD) 24 hr tablet 3 tabs po qd      . metoprolol succinate (TOPROL-XL) 100 MG 24 hr tablet 1 1/2 tab po qd      . nitroGLYCERIN (NITROSTAT) 0.4 MG SL tablet Place 1 tablet (0.4 mg total) under the tongue every 5 (five) minutes as needed.  90 tablet  1  . Olopatadine HCl (PATADAY) 0.2 % SOLN Apply to eye as directed.      [name] oxyCODONE-acetaminophen (PERCOCET) 5-325 MG per tablet Take 1 tablet by mouth 3 (three) times daily.        . rosuvastatin (CRESTOR) 40 MG tablet Take 40  mg by mouth daily.        Allergies  Review of patient's allergies indicates no known allergies.  Electrocardiogram:  05/13/11  SR rate 86 normal  Assessment and Plan

## 2011-12-14 NOTE — Assessment & Plan Note (Signed)
Discussed low carb diet.  Target hemoglobin A1c is 6.5 or less.  Continue current medications.  

## 2011-12-14 NOTE — Assessment & Plan Note (Signed)
Well controlled.  Continue current medications and low sodium Dash type diet.    

## 2011-12-14 NOTE — Assessment & Plan Note (Signed)
Cholesterol is at goal.  Continue current dose of statin and diet Rx.  No myalgias or side effects.  F/U  LFT's in 6 months. Lab Results  Component Value Date   LDLCALC 55 09/07/2010             

## 2011-12-14 NOTE — Patient Instructions (Signed)
Your physician wants you to follow-up in:  6 MONTHS WITH DR NISHAN  You will receive a reminder letter in the mail two months in advance. If you don't receive a letter, please call our office to schedule the follow-up appointment. Your physician recommends that you continue on your current medications as directed. Please refer to the Current Medication list given to you today. 

## 2011-12-14 NOTE — Assessment & Plan Note (Signed)
Stable with no angina and good activity level.  Continue medical Rx  

## 2012-06-12 ENCOUNTER — Ambulatory Visit: Payer: Medicare Other | Admitting: Cardiovascular Disease

## 2012-06-21 ENCOUNTER — Ambulatory Visit: Payer: Medicare Other | Admitting: Cardiovascular Disease

## 2012-07-10 ENCOUNTER — Encounter: Payer: Self-pay | Admitting: Cardiovascular Disease

## 2013-05-24 ENCOUNTER — Inpatient Hospital Stay
Admit: 2013-05-24 | Discharge: 2013-05-24 | Disposition: A | Discharge status: Home / Self Care | Attending: Emergency Medicine

## 2013-05-24 LAB — BASIC METABOLIC PANEL
BUN: 12 mg/dl (ref 7–22)
CO2: 23 meq/l (ref 23–33)
Calcium: 9.9 mg/dl (ref 8.5–10.5)
Chloride: 99 meq/l (ref 98–111)
Creatinine: 1.1 mg/dl (ref 0.4–1.2)
Glucose: 196 mg/dl — ABNORMAL HIGH (ref 70–108)
Potassium: 4.1 meq/l (ref 3.5–5.2)
Sodium: 138 meq/l (ref 135–145)

## 2013-05-24 LAB — CBC WITH AUTO DIFFERENTIAL
Basophils Absolute: 0 10*3/uL (ref 0.0–0.1)
Basophils: 0.4 %
Eosinophils Absolute: 0.1 10*3/uL (ref 0.0–0.4)
Eosinophils: 1.7 %
Hematocrit: 46.6 % (ref 42.0–52.0)
Hemoglobin: 15.7 gm/dl (ref 14.0–18.0)
Lymphocytes Absolute: 1.8 10*3/uL (ref 1.0–4.8)
Lymphocytes: 29.3 %
MCH: 30.6 pg (ref 27.0–31.0)
MCHC: 33.7 gm/dl (ref 33.0–37.0)
MCV: 90.8 fL (ref 80.0–94.0)
MPV: 10 mcm (ref 7.4–10.4)
Monocytes Absolute: 0.5 10*3/uL (ref 0.4–1.3)
Monocytes: 8.4 %
Platelets: 204 10*3/uL (ref 130–400)
RBC Morphology: NORMAL
RBC: 5.13 10*6/uL (ref 4.70–6.10)
RDW: 13.3 % (ref 11.5–14.5)
Seg Neutrophils: 60.2 %
Segs Absolute: 3.8 10*3/uL (ref 1.8–7.7)
WBC: 6.3 10*3/uL (ref 4.8–10.8)
nRBC: 0 /100 wbc

## 2013-05-24 LAB — GLOMERULAR FILTRATION RATE, ESTIMATED: Est, Glom Filt Rate: 69 mL/min/{1.73_m2} — AB

## 2013-05-24 LAB — OSMOLALITY: Osmolality Calc: 280.9 mOsmol/kg (ref 275.0–300)

## 2013-05-24 LAB — ANION GAP: Anion Gap: 20.1 — ABNORMAL HIGH (ref 10.0–20.0)

## 2013-05-24 MED ORDER — OXYCODONE-ACETAMINOPHEN 5-325 MG PO TABS
5-325 MG | ORAL_TABLET | Freq: Four times a day (QID) | ORAL | Status: DC | PRN
Start: 2013-05-24 — End: 2014-09-24

## 2013-05-24 MED ADMIN — 0.9 % sodium chloride bolus: 1000 mL | INTRAVENOUS | @ 16:00:00 | NDC 00338004904

## 2013-05-24 MED ADMIN — ondansetron (ZOFRAN) injection 4 mg: 4 mg | INTRAVENOUS | @ 16:00:00 | NDC 36000001225

## 2013-05-24 MED ADMIN — ketorolac (TORADOL) injection 30 mg: 30 mg | INTRAVENOUS | @ 16:00:00 | NDC 00409379501

## 2013-05-24 MED FILL — ONDANSETRON HCL 4 MG/2ML IJ SOLN: 4 MG/2ML | INTRAMUSCULAR | Qty: 2

## 2013-05-24 MED FILL — KETOROLAC TROMETHAMINE 30 MG/ML IJ SOLN: 30 MG/ML | INTRAMUSCULAR | Qty: 1

## 2013-05-24 NOTE — ED Notes (Signed)
Back from CT.     Clent Jacks, RN  05/24/13 [phone]

## 2013-05-24 NOTE — ED Notes (Signed)
Patient presents to the emergency dept with complaint of lower back pain and nausea. Patient states that he just got up to [place] from [place] four days ago and brought all his medications except he forgot the Percocet. Patient states that he normally takes his Percocet 3 times a day. Patient states that his wife tried to mail him his medication Percocet, but was unable to due to it being a narcotic.    Edmon Crapeonda M Yun Gutierrez, RN  05/24/13 1047

## 2013-05-24 NOTE — ED Notes (Signed)
Patient state he is having back pain, and nausea.   Patient states his last dose of percocet was on Sunday.   Patient denies any other concerns or needs at this time.    Urbano HeirBenjamin J Xylia Scherger, RN  05/24/13 [phone]

## 2013-05-24 NOTE — ED Provider Notes (Signed)
[facility]  eMERGENCY dEPARTMENT eNCOUnter          [place]       Chief Complaint   Patient presents with   ??? Back Pain     LOWER BACK PAIN   ??? Nausea       Nurses Notes reviewed and I agree except as noted in the HPI.    HISTORY OF PRESENT ILLNESS    Micheal [name] is a 58 y.o. male who presents to the Emergency Department for the evaluation of diarrhea and nausea. The patient states he developed diarrhea and nausea two days ago. He states he has had associated diaphoresis and chills. The patient also presents with back pain, which he states he has had for years. The patient rates this pain as a 9/10 in severity and describes it as aching in character. He states this pain is intermittent. The patient denies any history of abdominal problems.    The HPI was provided by the patient.      REVIEW OF SYSTEMS     Review of Systems   Constitutional: Positive for chills and diaphoresis. Negative for fever.   HENT: Negative for ear discharge, facial swelling and rhinorrhea.    Eyes: Negative for photophobia, discharge and redness.   Respiratory: Negative for cough, shortness of breath and stridor.    Cardiovascular: Negative for palpitations and leg swelling.   Gastrointestinal: Positive for nausea and diarrhea. Negative for vomiting and abdominal distention.   Musculoskeletal: Positive for back pain. Negative for joint swelling and gait problem.   Skin: Negative for color change and rash (on exposed body surfaces).   Neurological: Negative for tremors and syncope.   Psychiatric/Behavioral: Negative for confusion and agitation. The patient is not nervous/anxious.      PAST MEDICAL HISTORY    has a past medical history of CAD (coronary artery disease); Diabetes mellitus ([place]); Hyperlipidemia; and Hypertension.    SURGICAL HISTORY      has past surgical history that includes Coronary artery bypass graft and orthopedic surgery (Left).    CURRENT MEDICATIONS       Discharge Medication List as of  05/24/2013 12:56 PM      CONTINUE these medications which have NOT CHANGED    Details   Colesevelam HCl (WELCHOL) 3.75 G PACK Take 1 packet by mouth Daily with lunch      fenofibrate (TRICOR) 145 MG tablet Take 145 mg by mouth daily      metoprolol (TOPROL-XL) 100 MG XL tablet Take 150 mg by mouth daily      lisinopril (PRINIVIL;ZESTRIL) 40 MG tablet Take 40 mg by mouth daily      colchicine (COLCRYS) 0.6 MG tablet Take 0.6 mg by mouth 1 to 2 tablets as needed. May take 1 tablet 1-2 hours later if no improvement. Maximum of 3 tablets in 24 hours.      esomeprazole (NEXIUM) 40 MG capsule Take 40 mg by mouth every morning (before breakfast)      olopatadine (PATANOL) 0.1 % ophthalmic solution Place 1 drop into both eyes 2 times daily      clonazePAM (KLONOPIN) 2 MG tablet Take 2 mg by mouth 3 times daily as needed for Anxiety      ezetimibe (ZETIA) 10 MG tablet Take 10 mg by mouth daily      acetaminophen (TYLENOL) 500 MG tablet Take 500 mg by mouth every 6 hours as needed for Pain      fluticasone (FLONASE) 50  MCG/ACT nasal spray 2 sprays by Each Nare route daily      clopidogrel (PLAVIX) 75 MG tablet Take 75 mg by mouth daily      rosuvastatin (CRESTOR) 40 MG tablet Take 40 mg by mouth every morning      aspirin 325 MG EC tablet Take 325 mg by mouth daily      Multiple Vitamins-Minerals (CENTRUM SILVER ULTRA MENS) TABS Take 1 tablet by mouth daily      metFORMIN ER, MOD, (GLUMETZA) 500 MG ER tablet Take 2,000 mg by mouth daily (with breakfast)      nitroGLYCERIN (NITROSTAT) 0.4 MG SL tablet Place 0.4 mg under the tongue every 5 minutes as needed for Chest pain      oxyCODONE-acetaminophen (PERCOCET) 10-325 MG per tablet   Take 1 tablet by mouth 3 times daily as needed for Pain            ALLERGIES     has No Known Allergies.    FAMILY HISTORY     has no family status information on file.  family history includes Cancer in his mother; Heart Disease in his brother; Stroke in his brother.    SOCIAL HISTORY      reports  that he quit smoking about 5 years ago. His smoking use included Cigarettes. He smoked 0.00 packs per day. He does not have any smokeless tobacco history on file. He reports that he drinks about 7.2 ounces of alcohol per week. He reports that he does not use illicit drugs.    PHYSICAL EXAM     INITIAL VITALS:  height is 5' 7"$  (1.702 m) and weight is 210 lb (95.255 kg). His oral temperature is 98.1 ??F (36.7 ??C). His blood pressure is 133/92 and his pulse is 91. His respiration is 16 and oxygen saturation is 97%.    Physical Exam   Constitutional: He is oriented to person, place, and time. He appears well-developed and well-nourished.   HENT:   Head: Normocephalic and atraumatic.   Eyes: Conjunctivae are normal. Right eye exhibits no discharge. Left eye exhibits no discharge. No scleral icterus.   Neck: Normal range of motion. No JVD present.   Cardiovascular: Normal rate, regular rhythm and normal heart sounds.  Exam reveals no gallop and no friction rub.    No murmur heard.  Pulmonary/Chest: Effort normal and breath sounds normal. No stridor. He has no decreased breath sounds. He has no wheezes. He has no rhonchi. He has no rales.   Abdominal: Soft. There is no tenderness. There is no rebound and no guarding.   Musculoskeletal: Normal range of motion. He exhibits no edema.   5cm x 3cm lipoma on the left lower lumbar, tender but not erythematous   Neurological: He is alert and oriented to person, place, and time.   Skin: Skin is warm and dry. No rash (on exposed body surfaces) noted. He is not diaphoretic.   Psychiatric: He has a normal mood and affect. His behavior is normal. Thought content normal.     DIFFERENTIAL DIAGNOSIS:     Gastroenteritis, drug withdrawal, lumbar strain, UTI    DIAGNOSTIC RESULTS     RADIOLOGY: non-plain film images(s) such as CT, Ultrasound and MRI are read by the radiologist.    CT LUMBAR SPINE WO CONTRAST    Final Result: IMPRESSION: No acute findings. No suggestion of a lumbosacral  lipoma. If the patient's symptoms persist, MRI is suggested.  Final report electronically signed by Dr. Cordie Grice on 05/24/2013 12:21 PM             LABS:   Labs Reviewed   BASIC METABOLIC PANEL - Abnormal; Notable for the following:     Glucose 196 (*)     All other components within normal limits   GLOMERULAR FILTRATION RATE, ESTIMATED - Abnormal; Notable for the following:     Est, Glom Filt Rate 69 (*)     All other components within normal limits   ANION GAP - Abnormal; Notable for the following:     Anion Gap 20.1 (*)     All other components within normal limits   CBC WITH AUTO DIFFERENTIAL   OSMOLALITY       EMERGENCY DEPARTMENT COURSE:   Vitals:    Filed Vitals:    05/24/13 1041   BP: 133/92   Pulse: 91   Temp: 98.1 ??F (36.7 ??C)   TempSrc: Oral   Resp: 16   Height: 5' 7"$  (1.702 m)   Weight: 210 lb (95.255 kg)   SpO2: 97%     10:41 AM: The patient was seen and evaluated.        CRITICAL CARE:   none    CONSULTS:  none    PROCEDURES:  None    FINAL IMPRESSION      1. Gastroenteritis          DISPOSITION/PLAN   Discharge patient home with a limited number Percocet instructions follow-up primary care doctor next couple days    PATIENT REFERRED TO:  your physician in 1-2 days            DISCHARGE MEDICATIONS:  Discharge Medication List as of 05/24/2013 12:56 PM      START taking these medications    Details   oxyCODONE-acetaminophen (PERCOCET) 5-325 MG per tablet Take 1 tablet by mouth every 6 hours as needed for Pain, Disp-6 tablet, R-0             (Please note that portions of this note were completed with a voice recognition program.  Efforts were made to edit the dictations but occasionally words are mis-transcribed.)    Scribe:  Kathreen Cosier 05/24/2013 10:41 AM Scribing for and in the presence of Corky Sox, MD.    Provider:  I personally performed the services described in the documentation, reviewed and edited the documentation which was dictated to the scribe in my presence, and it  accurately records my words and actions.    Corky Sox, MD 05/24/2013 10:15 PM                        Corky Sox, MD  05/24/13 2220

## 2013-05-24 NOTE — ED Notes (Signed)
Dr. Clearance CootsHarper at bedside to evaluate patient.    Urbano HeirBenjamin J Jatorian Renault, RN  05/24/13 [phone]

## 2013-11-13 ENCOUNTER — Emergency Department (HOSPITAL_COMMUNITY): Payer: Medicare Other

## 2013-11-13 ENCOUNTER — Emergency Department (HOSPITAL_COMMUNITY)
Admission: EM | Admit: 2013-11-13 | Discharge: 2013-11-13 | Disposition: A | Discharge status: Home / Self Care | Payer: Medicare Other | Attending: Emergency Medicine | Admitting: Emergency Medicine

## 2013-11-13 ENCOUNTER — Encounter (HOSPITAL_COMMUNITY): Payer: Self-pay | Admitting: Emergency Medicine

## 2013-11-13 DIAGNOSIS — E785 Hyperlipidemia, unspecified: Secondary | ICD-10-CM | POA: Diagnosis not present

## 2013-11-13 DIAGNOSIS — T07XXXA Unspecified multiple injuries, initial encounter: Secondary | ICD-10-CM

## 2013-11-13 DIAGNOSIS — K760 Fatty (change of) liver, not elsewhere classified: Secondary | ICD-10-CM | POA: Insufficient documentation

## 2013-11-13 DIAGNOSIS — Z7951 Long term (current) use of inhaled steroids: Secondary | ICD-10-CM | POA: Diagnosis not present

## 2013-11-13 DIAGNOSIS — S161XXA Strain of muscle, fascia and tendon at neck level, initial encounter: Secondary | ICD-10-CM | POA: Diagnosis not present

## 2013-11-13 DIAGNOSIS — I2510 Atherosclerotic heart disease of native coronary artery without angina pectoris: Secondary | ICD-10-CM | POA: Insufficient documentation

## 2013-11-13 DIAGNOSIS — E119 Type 2 diabetes mellitus without complications: Secondary | ICD-10-CM | POA: Insufficient documentation

## 2013-11-13 DIAGNOSIS — K219 Gastro-esophageal reflux disease without esophagitis: Secondary | ICD-10-CM | POA: Insufficient documentation

## 2013-11-13 DIAGNOSIS — Z79899 Other long term (current) drug therapy: Secondary | ICD-10-CM | POA: Insufficient documentation

## 2013-11-13 DIAGNOSIS — S0081XA Abrasion of other part of head, initial encounter: Secondary | ICD-10-CM | POA: Insufficient documentation

## 2013-11-13 DIAGNOSIS — K8020 Calculus of gallbladder without cholecystitis without obstruction: Secondary | ICD-10-CM | POA: Insufficient documentation

## 2013-11-13 DIAGNOSIS — Z951 Presence of aortocoronary bypass graft: Secondary | ICD-10-CM | POA: Diagnosis not present

## 2013-11-13 DIAGNOSIS — Z87891 Personal history of nicotine dependence: Secondary | ICD-10-CM | POA: Insufficient documentation

## 2013-11-13 DIAGNOSIS — Y92410 Unspecified street and highway as the place of occurrence of the external cause: Secondary | ICD-10-CM | POA: Diagnosis not present

## 2013-11-13 DIAGNOSIS — G8929 Other chronic pain: Secondary | ICD-10-CM | POA: Insufficient documentation

## 2013-11-13 DIAGNOSIS — S0001XA Abrasion of scalp, initial encounter: Secondary | ICD-10-CM | POA: Diagnosis not present

## 2013-11-13 DIAGNOSIS — I10 Essential (primary) hypertension: Secondary | ICD-10-CM | POA: Insufficient documentation

## 2013-11-13 DIAGNOSIS — Z8659 Personal history of other mental and behavioral disorders: Secondary | ICD-10-CM | POA: Diagnosis not present

## 2013-11-13 DIAGNOSIS — S199XXA Unspecified injury of neck, initial encounter: Secondary | ICD-10-CM | POA: Diagnosis present

## 2013-11-13 DIAGNOSIS — I252 Old myocardial infarction: Secondary | ICD-10-CM | POA: Diagnosis not present

## 2013-11-13 DIAGNOSIS — Z7982 Long term (current) use of aspirin: Secondary | ICD-10-CM | POA: Diagnosis not present

## 2013-11-13 DIAGNOSIS — Y9389 Activity, other specified: Secondary | ICD-10-CM | POA: Insufficient documentation

## 2013-11-13 DIAGNOSIS — Z7902 Long term (current) use of antithrombotics/antiplatelets: Secondary | ICD-10-CM | POA: Insufficient documentation

## 2013-11-13 DIAGNOSIS — Z8709 Personal history of other diseases of the respiratory system: Secondary | ICD-10-CM | POA: Insufficient documentation

## 2013-11-13 LAB — CBC WITH DIFFERENTIAL/PLATELET
Basophils Absolute: 0 10*3/uL (ref 0.0–0.1)
Basophils Relative: 0 % (ref 0–1)
Eosinophils Absolute: 0.1 10*3/uL (ref 0.0–0.7)
Eosinophils Relative: 1 % (ref 0–5)
HCT: 41.3 % (ref 39.0–52.0)
HEMOGLOBIN: 14.2 g/dL (ref 13.0–17.0)
LYMPHS ABS: 1.9 10*3/uL (ref 0.7–4.0)
LYMPHS PCT: 26 % (ref 12–46)
MCH: 29.8 pg (ref 26.0–34.0)
MCHC: 34.4 g/dL (ref 30.0–36.0)
MCV: 86.6 fL (ref 78.0–100.0)
MONOS PCT: 6 % (ref 3–12)
Monocytes Absolute: 0.5 10*3/uL (ref 0.1–1.0)
NEUTROS ABS: 4.7 10*3/uL (ref 1.7–7.7)
NEUTROS PCT: 67 % (ref 43–77)
Platelets: 215 10*3/uL (ref 150–400)
RBC: 4.77 MIL/uL (ref 4.22–5.81)
RDW: 12.7 % (ref 11.5–15.5)
WBC: 7.2 10*3/uL (ref 4.0–10.5)

## 2013-11-13 LAB — BASIC METABOLIC PANEL
ANION GAP: 17 — AB (ref 5–15)
BUN: 17 mg/dL (ref 6–23)
CHLORIDE: 102 meq/L (ref 96–112)
CO2: 23 mEq/L (ref 19–32)
Calcium: 10 mg/dL (ref 8.4–10.5)
Creatinine, Ser: 1.4 mg/dL — ABNORMAL HIGH (ref 0.50–1.35)
GFR calc non Af Amer: 54 mL/min — ABNORMAL LOW (ref >90)
GFR, EST AFRICAN AMERICAN: 62 mL/min — AB (ref >90)
Glucose, Bld: 106 mg/dL — ABNORMAL HIGH (ref 70–99)
POTASSIUM: 4.4 meq/L (ref 3.7–5.3)
Sodium: 142 mEq/L (ref 137–147)

## 2013-11-13 LAB — ETHANOL: Alcohol, Ethyl (B): <11 mg/dL (ref 0–11)

## 2013-11-13 MED ORDER — SODIUM CHLORIDE 0.9 % IV BOLUS (SEPSIS): 250.0000 mL | Freq: Once | INTRAVENOUS | Status: DC

## 2013-11-13 MED ORDER — NAPROXEN 500 MG PO TABS: 500.0000 mg | ORAL_TABLET | Freq: Two times a day (BID) | ORAL | Status: AC

## 2013-11-13 MED ORDER — ONDANSETRON HCL 4 MG/2ML IJ SOLN
4.0000 mg | Freq: Once | INTRAMUSCULAR | Status: AC
  Administered 2013-11-13: 4 mg via INTRAVENOUS

## 2013-11-13 MED ORDER — IOHEXOL 300 MG/ML  SOLN
100.0000 mL | Freq: Once | INTRAMUSCULAR | Status: AC | PRN
  Administered 2013-11-13: 100 mL via INTRAVENOUS

## 2013-11-13 MED ORDER — SODIUM CHLORIDE 0.9 % IV SOLN: INTRAVENOUS | Status: DC

## 2013-11-13 MED ORDER — HYDROCODONE-ACETAMINOPHEN 5-325 MG PO TABS
1.0000 | ORAL_TABLET | Freq: Four times a day (QID) | ORAL | Status: AC | PRN
Start: 2013-11-13 — End: ?

## 2013-11-13 MED ORDER — HYDROMORPHONE HCL 1 MG/ML IJ SOLN
0.5000 mg | Freq: Once | INTRAMUSCULAR | Status: AC
  Administered 2013-11-13: 0.5 mg via INTRAVENOUS
  Filled 2013-11-13: qty 1

## 2013-11-13 MED ORDER — ONDANSETRON HCL 4 MG/2ML IJ SOLN
4.0000 mg | Freq: Once | INTRAMUSCULAR | Status: DC
  Filled 2013-11-13: qty 2

## 2013-11-13 NOTE — ED Notes (Signed)
Visitor calls the patient's room, and reports not being able to pick up patient. Discussed bus pass, but she refuses due to final destination of [place]. Informed charge.

## 2013-11-13 NOTE — Discharge Instructions (Signed)
Take person on a regular basis. Supplement with the hydrocodone as needed for more pain control. Expect to be very stiff and sore for at least the next 2 days. Return for any newer worse symptoms. CT scan head to pelvis without any significant findings.

## 2013-11-13 NOTE — ED Notes (Signed)
Per Dr. Darlyn ChamberZackowski's permission, gave pt a [place] sandwich and soda
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
## 2013-11-13 NOTE — ED Notes (Signed)
Dr. Deretha EmoryZackowski at the bedside, removed C-collar. Patient ambulated to the restroom.

## 2013-11-13 NOTE — ED Notes (Signed)
Patient now moved to the waiting room.
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
## 2014-08-13 ENCOUNTER — Ambulatory Visit: Admit: 2014-08-13 | Discharge: 2014-08-13 | Payer: MEDICARE | Attending: Family | Primary: Family Medicine

## 2014-08-13 DIAGNOSIS — I25118 Atherosclerotic heart disease of native coronary artery with other forms of angina pectoris: Secondary | ICD-10-CM

## 2014-08-13 NOTE — Progress Notes (Signed)
Subjective:      Patient ID: Micheal [name] is a 59 y.o. male.    HPI: NP to establish care. [facility] [place] [place] [facility] [place]    Past Medical History   Diagnosis Date   ??? CAD (coronary artery disease)    ??? Diabetes mellitus (HCC)    ??? Hyperlipidemia    ??? Hypertension        Past Surgical History   Procedure Laterality Date   ??? Coronary artery bypass graft     ??? Orthopedic surgery Left        Family History   Problem Relation Age of Onset   ??? Cancer Mother    ??? Heart Disease Brother    ??? Stroke Brother    ??? Diabetes Brother    ??? Early Death Brother    ??? High Blood Pressure Brother    ??? High Cholesterol Brother    ??? Mental Retardation Sister        Current Outpatient Prescriptions   Medication Sig Dispense Refill   ??? Colesevelam HCl (WELCHOL) 3.75 G PACK Take 1 packet by mouth Daily with lunch     ??? fenofibrate (TRICOR) 145 MG tablet Take 145 mg by mouth daily     ??? metoprolol (TOPROL-XL) 100 MG XL tablet Take 150 mg by mouth daily     ??? lisinopril (PRINIVIL;ZESTRIL) 40 MG tablet Take 40 mg by mouth daily     ??? colchicine (COLCRYS) 0.6 MG tablet Take 0.6 mg by mouth 1 to 2 tablets as needed. May take 1 tablet 1-2 hours later if no improvement. Maximum of 3 tablets in 24 hours.     ??? esomeprazole (NEXIUM) 40 MG capsule Take 40 mg by mouth every morning (before breakfast)     ??? olopatadine (PATANOL) 0.1 % ophthalmic solution Place 1 drop into both eyes 2 times daily     ??? clonazePAM (KLONOPIN) 2 MG tablet Take 2 mg by mouth 3 times daily as needed for Anxiety     ??? ezetimibe (ZETIA) 10 MG tablet Take 10 mg by mouth daily     ??? acetaminophen (TYLENOL) 500 MG tablet Take 500 mg by mouth every 6 hours as needed for Pain     ??? fluticasone (FLONASE) 50 MCG/ACT nasal spray 2 sprays by Each Nare route daily     ??? clopidogrel (PLAVIX) 75 MG tablet Take 75 mg by mouth daily     ??? rosuvastatin (CRESTOR) 40 MG tablet Take 40 mg by mouth every morning     ??? aspirin 325 MG EC tablet Take 325 mg by mouth daily     ??? Multiple  Vitamins-Minerals (CENTRUM SILVER ULTRA MENS) TABS Take 1 tablet by mouth daily     ??? metFORMIN ER, MOD, (GLUMETZA) 500 MG ER tablet Take 2,000 mg by mouth daily (with breakfast)     ??? nitroGLYCERIN (NITROSTAT) 0.4 MG SL tablet Place 0.4 mg under the tongue every 5 minutes as needed for Chest pain     ??? oxyCODONE-acetaminophen (PERCOCET) 10-325 MG per tablet   Take 1 tablet by mouth 3 times daily as needed for Pain      ??? oxyCODONE-acetaminophen (PERCOCET) 5-325 MG per tablet Take 1 tablet by mouth every 6 hours as needed for Pain 6 tablet 0     No current facility-administered medications for this visit.        Social: Separated. Not working.  ON SSI.     Smoke: None    Colon  Cancer Screening - 2015  PSA testing discussion - due    Immunizations - PCV23 2015    Chief Complaint   Patient presents with   ??? Establish Care       Hx of CABG x 6.  Hx of 4-5 STENTS.  Last Cardiac surgery > 2 years ago.  No recent STRESS.  Has appt with [facility] Dr.Kahlil on July 26th, 2016.     Denies CP, SOB or chest tightness.  Will happen on occasion of chest tightness that resolves with nitro or just relaxing.       BP Readings from Last 3 Encounters:   08/13/14 112/70   05/24/13 (!) 133/92       BP wnl    Due for labs to monitor.     No results found for: LABA1C  No results found for: EAG    No components found for: CHLPL  No results found for: TRIG  No results found for: HDL  No results found for: LDLCALC  No results found for: LABVLDL      Chemistry        Component Value Date/Time    NA 138 05/24/2013 1145    K 4.1 05/24/2013 1145    CL 99 05/24/2013 1145    CO2 23 05/24/2013 1145    BUN 12 05/24/2013 1145    CREATININE 1.1 05/24/2013 1145        Component Value Date/Time    CALCIUM 9.9 05/24/2013 1145            No results found for: TSH, T3TOTAL, T4TOTAL, THYROIDAB    Lab Results   Component Value Date    WBC 6.3 05/24/2013    HGB 15.7 05/24/2013    HCT 46.6 05/24/2013    MCV 90.8 05/24/2013    PLT 204 05/24/2013          Health Maintenance   Topic Date Due   ??? HEPATITIS  C SCREENING  October 08, 1955   ??? FOOT EXAM  06/11/1964   ??? HEMOGLOBIN A1C  06/11/1964   ??? MICROALBUMINURIA  06/11/1964   ??? OPHTHALMOLOGY EXAM  06/11/1964   ??? LIPIDS  06/11/1964   ??? TETANUS VACCINE ADULT (11 YEARS AND UP)  06/11/1965   ??? HIV SCREENING  06/11/1969   ??? PNEUMOVAX 1 DOSE 19-64Y (1) 06/11/1972   ??? DIABETES SCREENING  06/12/1994   ??? ZOSTAVAX VACCINE  06/12/2014   ??? FLU VACCINE YEARLY (ADULT)  08/25/2014   ??? COLON CANCER SCREENING COLONOSCOPY  07/25/2023       Immunization History   Administered Date(s) Administered   ??? Pneumococcal Polysaccharide (Pneumovax23) 07/24/2013         Review of Systems   Constitutional: Negative for chills and fever.   HENT: Negative.    Respiratory: Negative for cough and shortness of breath.    Cardiovascular: Negative for chest pain.   Gastrointestinal: Negative for abdominal pain and nausea.   Skin: Negative for rash.   Neurological: Negative for dizziness, light-headedness and headaches.   Psychiatric/Behavioral: Negative.        Objective:   Physical Exam   Constitutional: He is oriented to person, place, and time. Vital signs are normal. He appears well-developed and well-nourished. He is active. He does not have a sickly appearance. No distress.   HENT:   Right Ear: Tympanic membrane normal.   Left Ear: Tympanic membrane normal.   Nose: Nose normal.   Mouth/Throat: Oropharynx is clear and moist and mucous membranes are  normal.   Cardiovascular: Normal rate, regular rhythm, S1 normal, S2 normal, normal heart sounds and normal pulses.  Exam reveals no S3.    No murmur heard.  Pulmonary/Chest: Effort normal and breath sounds normal. He has no decreased breath sounds. He has no wheezes. He has no rhonchi.   Abdominal: Soft. Bowel sounds are normal. There is no tenderness.   Neurological: He is alert and oriented to person, place, and time.   Psychiatric: He has a normal mood and affect. His behavior is normal.        Assessment:      1. Coronary artery disease of native heart with stable angina pectoris, unspecified vessel or lesion type (HCC)     2. History of coronary artery stent placement     3. Hx of CABG     4. Type 2 diabetes mellitus without complication, without long-term current use of insulin (HCC)  Hemoglobin A1C    Microalbumin, Ur   5. Essential hypertension  CBC    Comprehensive Metabolic Panel   6. Mixed hyperlipidemia  Lipid Panel    TSH with Reflex   7. Prostate cancer screening  PSA Screening           Plan:      Pmhx reviewed  Chronic conditions stable  Labs to monitor  Get old records from PCP  Follow up with [facility] in 1 week  Healthy Lifestyles discussed  Preventative UTD  RTO in 6 months

## 2014-08-19 ENCOUNTER — Ambulatory Visit
Admit: 2014-08-19 | Discharge: 2014-08-19 | Payer: MEDICARE | Attending: Cardiovascular Disease | Primary: Family Medicine

## 2014-08-19 DIAGNOSIS — I25118 Atherosclerotic heart disease of native coronary artery with other forms of angina pectoris: Secondary | ICD-10-CM

## 2014-08-19 MED ORDER — ISOSORBIDE MONONITRATE ER 30 MG PO TB24
30 MG | ORAL_TABLET | Freq: Every day | ORAL | 3 refills | Status: DC
Start: 2014-08-19 — End: 2014-09-12

## 2014-08-19 NOTE — Progress Notes (Signed)
Patient here to establish local cardiologist   Has history of CAD with  History of stenting and CABG  Has occasional chest pain with exertion

## 2014-08-19 NOTE — Progress Notes (Signed)
[facility]  [address]  [address]  [place] [address]  Dept: [phone]  Dept Fax: [phone]  Loc: [phone]    Visit Date: 08/19/2014    Chief Complaint   Patient presents with   ??? Established New Doctor     last saw Cardiologist in [place]       HPI:   Micheal [name] IS A 59 y.o. male known to have CAD s/p CABG s/p multiple PCIs, HTN, HL, DM is presenting to establish care. His last stent was 4 years ago. He is able to walk 2 blocks before starting to feel tightness in his chest. The tightness in his chest is worse with exertion; it is better with rest. It improves with nitroglycerin. he has no orthopnea, pnds or lower extremity swelling; he has no early satiety.       MEDICAL DIAGNOSES  Patient Active Problem List    Diagnosis Date Noted   ??? 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 Essential hypertension 08/13/2014   ??? Mixed hyperlipidemia 08/13/2014   ??? Type 2 diabetes mellitus without complication, without long-term current use of insulin (HCC) 08/13/2014   ??? History of coronary artery stent placement 08/13/2014   ??? Hx of CABG 08/13/2014       No Known Allergies    Current Outpatient Prescriptions   Medication Sig Dispense Refill   ??? Colesevelam HCl (WELCHOL) 3.75 G PACK Take 1 packet by mouth Daily with lunch     ??? fenofibrate (TRICOR) 145 MG tablet Take 145 mg by mouth daily     ??? metoprolol (TOPROL-XL) 100 MG XL tablet Take 150 mg by mouth daily     ??? lisinopril (PRINIVIL;ZESTRIL) 40 MG tablet Take 40 mg by mouth daily     ??? colchicine (COLCRYS) 0.6 MG tablet Take 0.6 mg by mouth 1 to 2 tablets as needed. May take 1 tablet 1-2 hours later if no improvement. Maximum of 3 tablets in 24 hours.     ??? esomeprazole (NEXIUM) 40 MG capsule Take 40 mg by mouth every morning (before breakfast)     ??? olopatadine (PATANOL) 0.1 % ophthalmic solution Place 1 drop into both eyes 2 times daily     ??? clonazePAM  (KLONOPIN) 2 MG tablet Take 2 mg by mouth 3 times daily as needed for Anxiety     ??? ezetimibe (ZETIA) 10 MG tablet Take 10 mg by mouth daily     ??? acetaminophen (TYLENOL) 500 MG tablet Take 500 mg by mouth every 6 hours as needed for Pain     ??? fluticasone (FLONASE) 50 MCG/ACT nasal spray 2 sprays by Each Nare route daily     ??? clopidogrel (PLAVIX) 75 MG tablet Take 75 mg by mouth daily     ??? rosuvastatin (CRESTOR) 40 MG tablet Take 40 mg by mouth every morning     ??? aspirin 325 MG EC tablet Take 325 mg by mouth daily     ??? Multiple Vitamins-Minerals (CENTRUM SILVER ULTRA MENS) TABS Take 1 tablet by mouth daily     ??? metFORMIN ER, MOD, (GLUMETZA) 500 MG ER tablet Take 2,000 mg by mouth daily (with breakfast)     ??? nitroGLYCERIN (NITROSTAT) 0.4 MG SL tablet Place 0.4 mg under the tongue every 5 minutes as needed for Chest pain     ??? oxyCODONE-acetaminophen (PERCOCET) 10-325 MG per tablet  Take 1 tablet by mouth 3 times daily as needed for Pain      ??? oxyCODONE-acetaminophen (PERCOCET) 5-325 MG per tablet Take 1 tablet by mouth every 6 hours as needed for Pain 6 tablet 0     No current facility-administered medications for this visit.        Social History     Social History   ??? Marital status: Unknown     Spouse name: N/A   ??? Number of children: N/A   ??? Years of education: N/A     Occupational History   ??? Not on file.     Social History Main Topics   ??? Smoking status: Former Smoker     Types: Cigarettes     Quit date: 05/24/2008   ??? Smokeless tobacco: Not on file   ??? Alcohol use 7.2 oz/week     12 Cans of beer per week      Comment: 12 beers daily   ??? Drug use: No   ??? Sexual activity: Yes     Other Topics Concern   ??? Not on file     Social History Narrative       Family History   Problem Relation Age of Onset   ??? Cancer Mother    ??? Heart Disease Brother    ??? Stroke Brother    ??? Diabetes Brother    ??? Early Death Brother    ??? High Blood Pressure Brother    ??? High Cholesterol Brother    ??? Mental Retardation Sister         Subjective:      Review of Systems     CONSTITUTIONAL: Denies: fever, chills, weight change  ALLERGIES: Denies: urticaria  EYES: Denies change in vision  CARDIOVASCULAR: as noted in HPI  RESPIRATORY: Denies: cough, shortness of breath  ENDOCRINE: Denies skin changes  HEME-LYMPH: Denies: bleeding  GI: Denies: nausea, vomiting, black stool, blood in stool  GU:  Denies: frequency/urgency  NEURO: Denies: dizziness/vertigo, speech problems, loss of consciousness, has headache  MUSCULOSKELETAL: Denies: muscle pain, muscle weakness  SKIN: Denies: rash      Objective:     Physical Exam  Wt Readings from Last 3 Encounters:   08/19/14 203 lb 9.6 oz (92.4 kg)   08/13/14 203 lb 12.8 oz (92.4 kg)   05/24/13 210 lb (95.3 kg)     BP Readings from Last 3 Encounters:   08/19/14 122/78   08/13/14 112/70   05/24/13 (!) 133/92       Visit Vitals   ??? BP 122/78 (Site: Left Arm, Position: Sitting)   ??? Pulse 66   ??? Wt 203 lb 9.6 oz (92.4 kg)   ??? BMI 31.65 kg/m2       General Appearance:  Alert, cooperative, no distress   Head:   without obvious abnormality   Throat: Lips, mucosa, and tongue normal; teeth and gums normal   Neck: Supple, symmetrical, trachea midline, no adenopathy, no carotid bruit or JVD   Lungs:   Clear to auscultation bilaterally, respirations unlabored   Chest Wall:  No tenderness or deformity   Heart:  Regular rate and rhythm, S1, S2 normal, no murmur, rub or gallop   Abdomen:   Soft, non-tender, bowel sounds active all four quadrants,  no masses, no organomegaly   Extremities: Extremities normal, atraumatic, no cyanosis or edema   Pulses: 2+ and symmetric   Skin: Skin color, texture, turgor normal, no rashes or lesions   Neurologic:  Grossly physiologic     Lab Data  CBC:   Lab Results   Component Value Date    WBC 6.3 05/24/2013    RBC 5.13 05/24/2013    HGB 15.7 05/24/2013    HCT 46.6 05/24/2013    MCV 90.8 05/24/2013    MCH 30.6 05/24/2013    MCHC 33.7 05/24/2013    RDW 13.3 05/24/2013    PLT 204 05/24/2013     MPV 10.0 05/24/2013     CMP:    Lab Results   Component Value Date    NA 138 05/24/2013    K 4.1 05/24/2013    CL 99 05/24/2013    CO2 23 05/24/2013    BUN 12 05/24/2013    CREATININE 1.1 05/24/2013    LABGLOM 69 05/24/2013    GLUCOSE 196 05/24/2013    CALCIUM 9.9 05/24/2013     Hepatic Function Panel:  No results found for: ALKPHOS, ALT, AST, PROT, BILITOT, BILIDIR, IBILI, LABALBU  Magnesium:  No results found for: MG  PT/INR:  No results found for: PROTIME, INR  HgBA1c:  No results found for: LABA1C  FLP:  No results found for: TRIG, HDL, LDLCALC, LDLDIRECT, LABVLDL  TSH:  No results found for: TSH  No results for input(s): CKTOTAL, CKMB, CKMBINDEX, TROPONINI in the last 72 hours.       EKG: NSR    Assessment/Plan     Micheal [name] IS A 59 y.o. male known to have CAD s/p CABG s/p multiple PCIs, HTN, HL, DM is presenting to establish care    1. CAD s/p CABG    -stress test to evaluate for ischemia since he is having chest pain, Echo    -He reports that he is "tired of being cut"    -Imdur 30 mg daily for chest pain    2. HTN    -controlled    -will keep same medications    3. HL    -keep crestor     -keep zetia, welchol       RTC in 3 weeks.        Micheal [name] Elige Ko  08/19/2014  8:52 AM

## 2014-08-27 ENCOUNTER — Encounter

## 2014-08-27 MED ORDER — OXYCODONE-ACETAMINOPHEN 10-325 MG PO TABS
10-325 MG | ORAL_TABLET | Freq: Three times a day (TID) | ORAL | 0 refills | Status: DC | PRN
Start: 2014-08-27 — End: 2014-09-08

## 2014-08-27 NOTE — Telephone Encounter (Signed)
Controlled Substances Monitoring: Attestation: The Prescription Monitoring Report for this patient was reviewed today. Durwin Reges, NP)  Documentation: No signs of potential drug abuse or diversion identified., Possible medication side effects, risk of tolerance and/or dependence, and alternative treatments discussed Durwin Reges, NP)    Discussed case in consultation with Dr. Annie Sable for prescription of Scheduled 2 narcotic pain medications.

## 2014-08-27 NOTE — Telephone Encounter (Signed)
Referral form for Dr Arvella Nigh along with records complete and placed on providers desk to sign and fax.     Xray order for lumbar spine have been mailed to patient to complete.

## 2014-08-27 NOTE — Addendum Note (Signed)
Addended byMerrily Pew W on: 08/27/2014 12:58 PM     Modules accepted: Orders

## 2014-08-27 NOTE — Telephone Encounter (Signed)
08/27/2014   [name] called requesting a refill on the following medications:  Requested Prescriptions     Pending Prescriptions Disp Refills   ??? oxyCODONE-acetaminophen (PERCOCET) 10-325 MG per tablet       Sig: Take 1 tablet by mouth 3 times daily as needed for Pain     Pharmacy verified:  .pv        Date of last visit: 08/13/2014  Date of next visit (if applicable): 02/13/2015    Status of current medication:     Routine Refill

## 2014-08-27 NOTE — Telephone Encounter (Signed)
Patient notified and understanding voiced.  Would like referral to pain management to be able to continue with Percocet Rx.  Please generate referral.  Patient aware they will contact him to schedule an appt.

## 2014-09-03 LAB — LIPID PANEL
Cholesterol, Total: 100 mg/dl (ref 100–199)
HDL: 31 mg/dl
LDL Calculated: 53 mg/dl
Triglycerides: 82 mg/dl (ref 0–199)

## 2014-09-03 LAB — CBC
Hematocrit: 43.6 % (ref 42.0–52.0)
Hemoglobin: 14.8 gm/dl (ref 14.0–18.0)
MCH: 29.6 pg (ref 27.0–31.0)
MCHC: 34 gm/dl (ref 33.0–37.0)
MCV: 87.1 fL (ref 80.0–94.0)
MPV: 9.9 mcm (ref 7.4–10.4)
Platelets: 181 10*3/uL (ref 130–400)
RBC: 5.01 10*6/uL (ref 4.70–6.10)
RDW: 13.5 % (ref 11.5–14.5)
WBC: 6 10*3/uL (ref 4.8–10.8)

## 2014-09-03 LAB — COMPREHENSIVE METABOLIC PANEL
ALT: 39 U/L (ref 11–66)
AST: 28 U/L (ref 5–40)
Albumin: 4.6 gm/dl (ref 3.5–5.1)
Alkaline Phosphatase: 30 U/L — ABNORMAL LOW (ref 38–126)
BUN: 18 mg/dl (ref 7–22)
CO2: 26 meq/l (ref 23–33)
Calcium: 9.6 mg/dl (ref 8.5–10.5)
Chloride: 100 meq/l (ref 98–111)
Creatinine: 1.2 mg/dl (ref 0.4–1.2)
Glucose: 124 mg/dl — ABNORMAL HIGH (ref 70–108)
Potassium: 4.2 meq/l (ref 3.5–5.2)
Sodium: 141 meq/l (ref 135–145)
Total Bilirubin: 0.7 mg/dl (ref 0.3–1.2)
Total Protein: 8.3 gm/dl — ABNORMAL HIGH (ref 6.1–8.0)

## 2014-09-03 LAB — HEMOGLOBIN A1C
AVERAGE GLUCOSE: 147 mg/dl — ABNORMAL HIGH (ref 70–126)
Hemoglobin A1C: 6.9 % — ABNORMAL HIGH (ref 4.4–6.4)

## 2014-09-03 LAB — PSA SCREENING: PSA: 1.33 ng/ml (ref 0.00–2.50)

## 2014-09-03 LAB — ALBUMIN, RANDOM URINE
Creatinine, Urine: 230.3 mg/dl
Microalb/Creat Ratio: 6 mg/g (ref 0–30)
Microalbumin, Random Urine: 1.28 mg/dl

## 2014-09-03 LAB — ANION GAP: Anion Gap: 15 meq/l (ref 8.0–16.0)

## 2014-09-03 LAB — GLOMERULAR FILTRATION RATE, ESTIMATED: Est, Glom Filt Rate: 62 mL/min/{1.73_m2} — AB

## 2014-09-03 LAB — TSH: TSH: 3 mcIU/ml (ref 0.400–4.20)

## 2014-09-08 ENCOUNTER — Encounter: Admit: 2014-09-08 | Primary: Family Medicine

## 2014-09-08 ENCOUNTER — Encounter: Admit: 2014-09-08 | Attending: Cardiovascular Disease | Primary: Family Medicine

## 2014-09-08 ENCOUNTER — Inpatient Hospital Stay: Admit: 2014-09-08 | Primary: Family Medicine

## 2014-09-08 DIAGNOSIS — I25118 Atherosclerotic heart disease of native coronary artery with other forms of angina pectoris: Secondary | ICD-10-CM

## 2014-09-08 LAB — CARDIAC STRESS TEST EXERCISE ONLY: Left Ventricular Ejection Fraction: 60

## 2014-09-08 LAB — ECHOCARDIOGRAM COMPLETE 2D W DOPPLER W COLOR: Left Ventricular Ejection Fraction: 50

## 2014-09-08 MED ORDER — TECHNETIUM TC 99M SESTAMIBI - CARDIOLITE
Freq: Once | Status: AC | PRN
Start: 2014-09-08 — End: 2014-09-08
  Administered 2014-09-08: 14:00:00 11 via INTRAVENOUS

## 2014-09-08 MED ORDER — TECHNETIUM TC 99M SESTAMIBI - CARDIOLITE
Freq: Once | Status: AC | PRN
Start: 2014-09-08 — End: 2014-09-08
  Administered 2014-09-08: 16:00:00 35 via INTRAVENOUS

## 2014-09-08 MED FILL — TECHNETIUM TC 99M SESTAMIBI - CARDIOLITE: Qty: 0.2

## 2014-09-08 MED FILL — TECHNETIUM TC 99M SESTAMIBI - CARDIOLITE: Qty: 0.4

## 2014-09-10 ENCOUNTER — Encounter

## 2014-09-10 MED ORDER — CLONAZEPAM 2 MG PO TABS
2 MG | ORAL_TABLET | Freq: Two times a day (BID) | ORAL | 5 refills | Status: DC | PRN
Start: 2014-09-10 — End: 2014-09-24

## 2014-09-10 NOTE — Telephone Encounter (Signed)
Controlled Substances Monitoring: Attestation: The Prescription Monitoring Report for this patient was reviewed today. (Dawnell Bryant W Iyonnah Ferrante, NP)  Documentation: No signs of potential drug abuse or diversion identified. (Marijayne Rauth W Cloy Cozzens, NP)

## 2014-09-10 NOTE — Telephone Encounter (Signed)
Micheal [name] called requesting a refill on the following medications:  Requested Prescriptions     Pending Prescriptions Disp Refills   ??? clonazePAM (KLONOPIN) 2 MG tablet 60 tablet      Sig: Take 1 tablet by mouth 3 times daily as needed for Anxiety     Pharmacy verified:  .pv    [facility] [address]    Date of last visit: 08/13/14  Date of next visit (if applicable): 02/13/2015    Status of current medication:     Patient is low on medication

## 2014-09-11 NOTE — Telephone Encounter (Signed)
Prescribing error.  Did he fill rx?  If so will re-prescribe after he finishes out current dose taking TID which should last 20 days.   Will also refer him to [facility] as this is too much of a prescription for a family provider to [place].

## 2014-09-11 NOTE — Telephone Encounter (Signed)
Patient notified. Patient says he cannot afford to keep going to different dr's. Says he does not have a car and has to have someone take him to all his appointments and he has to pay them. Says he thought you would be able to do the same things as his other dr was able to do.  Please advise.

## 2014-09-11 NOTE — Telephone Encounter (Signed)
Patient called about rx for clonazepam. [place] Spaniel he has been taking medication TID and rx was for BID. Please advise if this change is correct and if so patient would like to know why. Please advise.

## 2014-09-11 NOTE — Telephone Encounter (Signed)
Unfortunately due to newer [place] and federal regulations I as a family provider am unable to continue those prescriptions long term at these doses due to medications being controlled products.  Willing to continue until see [facility] or if he decides to see if another family provider will prescribe that medication long term at that dose.

## 2014-09-12 ENCOUNTER — Ambulatory Visit
Admit: 2014-09-12 | Discharge: 2014-09-12 | Payer: MEDICARE | Attending: Cardiovascular Disease | Primary: Family Medicine

## 2014-09-12 DIAGNOSIS — E785 Hyperlipidemia, unspecified: Secondary | ICD-10-CM

## 2014-09-12 MED ORDER — ASPIRIN 81 MG PO TBEC
81 MG | ORAL_TABLET | Freq: Every day | ORAL | 3 refills | Status: DC
Start: 2014-09-12 — End: 2015-06-08

## 2014-09-12 MED ORDER — ISOSORBIDE MONONITRATE ER 30 MG PO TB24
30 MG | ORAL_TABLET | Freq: Every day | ORAL | 3 refills | Status: DC
Start: 2014-09-12 — End: 2014-10-10

## 2014-09-12 MED ORDER — COLESEVELAM HCL 3.75 G PO PACK
3.75 g | Freq: Every day | ORAL | 6 refills | Status: DC
Start: 2014-09-12 — End: 2016-05-06

## 2014-09-12 NOTE — Telephone Encounter (Signed)
Patient notified. He will let us know what he wants to do after he thinks about it some more.

## 2014-09-12 NOTE — Progress Notes (Signed)
[facility]  [facility]  [address]  [address]  [place] [address]  Dept: [phone]  Dept Fax: [phone]  Loc: [phone]    Visit Date: 09/12/2014    Chief Complaint   Patient presents with   ??? 2 Week Follow-Up       HPI:     Mr. Pascucci IS A 59 y.o. male known to have CAD s/p CABG s/p multiple PCIs, HTN, HL, DM is presenting to establish care.  He reports that he is able to walk up the stairs without chest pain or sob. he has no orthopnea, pnds or lower extremity swelling; he has no early satiety.       MEDICAL DIAGNOSES  Patient Active Problem List    Diagnosis Date Noted   ??? GAD (generalized anxiety disorder) 09/10/2014   ??? 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 Essential hypertension 08/13/2014   ??? Mixed hyperlipidemia 08/13/2014   ??? Type 2 diabetes mellitus without complication, without long-term current use of insulin (HCC) 08/13/2014   ??? History of coronary artery stent placement 08/13/2014   ??? Hx of CABG 08/13/2014       No Known Allergies    Current Outpatient Prescriptions   Medication Sig Dispense Refill   ??? clonazePAM (KLONOPIN) 2 MG tablet Take 1 tablet by mouth 2 times daily as needed for Anxiety (Patient taking differently: Take 2 mg by mouth 3 times daily as needed for Anxiety ) 60 tablet 5   ??? isosorbide mononitrate (IMDUR) 30 MG CR tablet Take 1 tablet by mouth daily 30 tablet 3   ??? Colesevelam HCl (WELCHOL) 3.75 G PACK Take 1 packet by mouth Daily with lunch     ??? fenofibrate (TRICOR) 145 MG tablet Take 145 mg by mouth daily     ??? metoprolol (TOPROL-XL) 100 MG XL tablet Take 150 mg by mouth daily     ??? lisinopril (PRINIVIL;ZESTRIL) 40 MG tablet Take 40 mg by mouth daily     ??? colchicine (COLCRYS) 0.6 MG tablet Take 0.6 mg by mouth 1 to 2 tablets as needed. May take 1 tablet 1-2 hours later if no improvement. Maximum of 3 tablets in 24 hours.     ??? esomeprazole (NEXIUM) 40 MG capsule Take 40 mg by mouth  every morning (before breakfast)     ??? olopatadine (PATANOL) 0.1 % ophthalmic solution Place 1 drop into both eyes 2 times daily     ??? ezetimibe (ZETIA) 10 MG tablet Take 10 mg by mouth daily     ??? fluticasone (FLONASE) 50 MCG/ACT nasal spray 2 sprays by Each Nare route daily     ??? clopidogrel (PLAVIX) 75 MG tablet Take 75 mg by mouth daily     ??? rosuvastatin (CRESTOR) 40 MG tablet Take 40 mg by mouth every morning     ??? aspirin 325 MG EC tablet Take 325 mg by mouth daily     ??? Multiple Vitamins-Minerals (CENTRUM SILVER ULTRA MENS) TABS Take 1 tablet by mouth daily     ??? metFORMIN ER, MOD, (GLUMETZA) 500 MG ER tablet Take 2,000 mg by mouth daily (with breakfast)     ??? nitroGLYCERIN (NITROSTAT) 0.4 MG SL tablet Place 0.4 mg under the tongue every 5 minutes as needed for Chest pain     ??? oxyCODONE-acetaminophen (PERCOCET) 5-325 MG per tablet Take 1 tablet by mouth every 6 hours as needed for  Pain 6 tablet 0     No current facility-administered medications for this visit.        Social History     Social History   ??? Marital status: Married     Spouse name: N/A   ??? Number of children: N/A   ??? Years of education: N/A     Occupational History   ??? Not on file.     Social History Main Topics   ??? Smoking status: Former Smoker     Types: Cigarettes     Quit date: 05/24/2008   ??? Smokeless tobacco: Not on file   ??? Alcohol use 7.2 oz/week     12 Cans of beer per week      Comment: 12 beers daily   ??? Drug use: No   ??? Sexual activity: Yes     Other Topics Concern   ??? Not on file     Social History Narrative       Family History   Problem Relation Age of Onset   ??? Cancer Mother    ??? Heart Disease Brother    ??? Stroke Brother    ??? Diabetes Brother    ??? Early Death Brother    ??? High Blood Pressure Brother    ??? High Cholesterol Brother    ??? Mental Retardation Sister        Subjective:      Review of Systems    Objective:     Physical Exam  Wt Readings from Last 3 Encounters:   09/12/14 199 lb (90.3 kg)   09/08/14 203 lb (92.1 kg)    08/19/14 203 lb 9.6 oz (92.4 kg)     BP Readings from Last 3 Encounters:   09/12/14 116/66   08/19/14 122/78   08/13/14 112/70       Visit Vitals   ??? BP 116/66 (Site: Right Arm, Position: Sitting)   ??? Pulse 64   ??? Ht  (1.702 m)   ??? Wt 199 lb (90.3 kg)   ??? BMI 31.17 kg/m2       General Appearance:  Alert, cooperative, no distress   Head:   without obvious abnormality   Throat: Lips, mucosa, and tongue normal; teeth and gums normal   Neck: Supple, symmetrical, trachea midline, no adenopathy, no carotid bruit or JVD   Lungs:   Clear to auscultation bilaterally, respirations unlabored   Chest Wall:  No tenderness or deformity   Heart:  Regular rate and rhythm, S1, S2 normal, no murmur, rub or gallop   Abdomen:   Soft, non-tender, bowel sounds active all four quadrants,  no masses, no organomegaly   Extremities: Extremities normal, atraumatic, no cyanosis or edema   Pulses: 2+ and symmetric   Skin: Skin color, texture, turgor normal, no rashes or lesions   Neurologic: Grossly physiologic       Lab Data  CBC:   Lab Results   Component Value Date    WBC 6.0 09/03/2014    RBC 5.01 09/03/2014    HGB 14.8 09/03/2014    HGB 15.7 05/24/2013    HCT 43.6 09/03/2014    MCV 87.1 09/03/2014    MCH 29.6 09/03/2014    MCHC 34.0 09/03/2014    RDW 13.5 09/03/2014    PLT 181 09/03/2014    PLT 204 05/24/2013    MPV 9.9 09/03/2014     CMP:    Lab Results   Component Value Date    NA 141  09/03/2014    K 4.2 09/03/2014    CL 100 09/03/2014    CO2 26 09/03/2014    BUN 18 09/03/2014    CREATININE 1.2 09/03/2014    CREATININE 1.1 05/24/2013    LABGLOM 62 09/03/2014    GLUCOSE 124 09/03/2014    PROT 8.3 09/03/2014    LABALBU 4.6 09/03/2014    CALCIUM 9.6 09/03/2014    BILITOT 0.7 09/03/2014    ALKPHOS 30 09/03/2014    AST 28 09/03/2014    ALT 39 09/03/2014     Hepatic Function Panel:    Lab Results   Component Value Date    ALKPHOS 30 09/03/2014    ALT 39 09/03/2014    AST 28 09/03/2014    PROT 8.3 09/03/2014    BILITOT 0.7 09/03/2014     LABALBU 4.6 09/03/2014     Magnesium:  No results found for: MG  PT/INR:  No results found for: PROTIME, INR  HgBA1c:    Lab Results   Component Value Date    LABA1C 6.9 09/03/2014     FLP:    Lab Results   Component Value Date    TRIG 82 09/03/2014    HDL 31 09/03/2014    LDLCALC 53 09/03/2014     TSH:    Lab Results   Component Value Date    TSH 3.000 09/03/2014     No results for input(s): CKTOTAL, CKMB, CKMBINDEX, TROPONINI in the last 72 hours.       Assessment/Plan     Mr. Haskell RilingOtero IS A 59 y.o. male known to have CAD s/p CABG s/p multiple PCIs, HTN, HL, DM is presenting to establish care  ??  1. CAD s/p CABG  ??  -stress test showed no ischemia  ??  -Imdur 15 mg daily for chest pain, he could not tolerate 30 mg daily    -If chest pain recurs, will do left heart catheterization  ??  2. HTN  ??  -controlled  ??  -will keep same medications  ??  3. HL  ??  -keep crestor   ??  -stop zetia    -keep welchol  ????  RTC in 4 weeks.  ??         Stefana Lodico K Joon Pohle  09/12/2014  1:07 PM

## 2014-09-12 NOTE — Telephone Encounter (Signed)
Attempted to notify patient, left message on voicemail.

## 2014-09-12 NOTE — Progress Notes (Signed)
Pt is a 3 week f/u from a stress and an echo  Denies any cp,sob, lt headed or dizziness   Does have edema to lt ankle  He wants a letter from you stating that his name is really Micheal [name] and not male

## 2014-09-24 ENCOUNTER — Ambulatory Visit: Admit: 2014-09-24 | Discharge: 2014-09-24 | Payer: MEDICARE | Attending: Family Medicine | Primary: Family Medicine

## 2014-09-24 DIAGNOSIS — E119 Type 2 diabetes mellitus without complications: Secondary | ICD-10-CM

## 2014-09-24 MED ORDER — FENOFIBRATE 145 MG PO TABS
145 MG | ORAL_TABLET | Freq: Every day | ORAL | 5 refills | Status: DC
Start: 2014-09-24 — End: 2015-04-26

## 2014-09-24 MED ORDER — ROSUVASTATIN CALCIUM 40 MG PO TABS
40 MG | ORAL_TABLET | Freq: Every morning | ORAL | 5 refills | Status: DC
Start: 2014-09-24 — End: 2015-04-26

## 2014-09-24 MED ORDER — ESOMEPRAZOLE MAGNESIUM 40 MG PO CPDR
40 MG | ORAL_CAPSULE | Freq: Every day | ORAL | 5 refills | Status: DC
Start: 2014-09-24 — End: 2015-04-02

## 2014-09-24 MED ORDER — CITALOPRAM HYDROBROMIDE 20 MG PO TABS
20 MG | ORAL_TABLET | Freq: Every day | ORAL | 5 refills | Status: DC
Start: 2014-09-24 — End: 2014-12-24

## 2014-09-24 MED ORDER — CLOPIDOGREL BISULFATE 75 MG PO TABS
75 MG | ORAL_TABLET | Freq: Every day | ORAL | 5 refills | Status: DC
Start: 2014-09-24 — End: 2015-04-26

## 2014-09-24 MED ORDER — OXYCODONE-ACETAMINOPHEN 10-325 MG PO TABS
10-325 MG | ORAL_TABLET | Freq: Three times a day (TID) | ORAL | 0 refills | Status: DC | PRN
Start: 2014-09-24 — End: 2014-10-23

## 2014-09-24 MED ORDER — METOPROLOL SUCCINATE ER 100 MG PO TB24
100 MG | ORAL_TABLET | Freq: Every day | ORAL | 5 refills | Status: DC
Start: 2014-09-24 — End: 2015-04-26

## 2014-09-24 MED ORDER — METFORMIN HCL ER (MOD) 500 MG PO TB24
500 MG | ORAL_TABLET | Freq: Every day | ORAL | 5 refills | Status: DC
Start: 2014-09-24 — End: 2015-06-01

## 2014-09-24 MED ORDER — LISINOPRIL 40 MG PO TABS
40 MG | ORAL_TABLET | Freq: Every day | ORAL | 5 refills | Status: DC
Start: 2014-09-24 — End: 2015-04-26

## 2014-09-24 NOTE — Patient Instructions (Signed)
Take 1 tab Of the Klonopin for twice per day x 1 week    Then take 1/2 tab of the Klonopin for twice per day for 1 week    Then take 1/2 tab once per day for 1 week    Then stop the Klonopin

## 2014-09-24 NOTE — Progress Notes (Signed)
Micheal [name] is a 59 y.o. male that presents for Established New Doctor - former pt of Merrily Pew and Anxiety - pt currently taking Clonazepam TID, pt is requesting something that he takes 1 tab once daily        HPI:    Patient was previously on Klonopin 2mg  TID, states that he has been on this for several years.  Stats that he has frequent anxiety and states that he notes it in social situations.  Notes that he can wake up frequently at night.  Notes some difficulty with organization and racing thoughts.    Patient states that he currently takes percocet due to low back pain.  States that it has been recommend that he have surgery for his current symptoms, but he is concerned due to complications.  He states that he has a mas sin his low back, but is unable to explain what this is.  He notes that he has pain that radiates to the legs.  Also with LE paresthesias.    HTN    Does patient check BP regularly at home? - No  Current Medication regimen - metoprolol, Imdur  Tolerating medications well? - yes    Shortness of breath or chest pain? No  Headache or visual complaints? No  Neurologic changes like confusion? No  Extremity edema? No    BP Readings from Last 3 Encounters:   09/24/14 90/60   09/12/14 116/66   08/19/14 122/78         I have reviewed the patient's past medical history, past surgical history, allergies, medications, social and family history and I have made updates where appropriate.    Past Medical History   Diagnosis Date   ??? CAD (coronary artery disease)    ??? Diabetes mellitus (HCC)    ??? Hyperlipidemia    ??? Hypertension        Past Surgical History   Procedure Laterality Date   ??? Coronary artery bypass graft     ??? Orthopedic surgery Left        Social History   Substance Use Topics   ??? Smoking status: Former Smoker     Types: Cigarettes     Quit date: 05/24/2008   ??? Smokeless tobacco: None   ??? Alcohol use 7.2 oz/week     12 Cans of beer per week      Comment: 12 beers daily       Family History    Problem Relation Age of Onset   ??? Cancer Mother    ??? Heart Disease Brother    ??? Stroke Brother    ??? Diabetes Brother    ??? Early Death Brother    ??? High Blood Pressure Brother    ??? High Cholesterol Brother    ??? Mental Retardation Sister          Review of Systems   Constitutional: Negative for chills, fever and malaise/fatigue.   HENT: Negative for congestion, ear pain, nosebleeds, sore throat and tinnitus.    Eyes: Negative for blurred vision, double vision, pain, discharge and redness.   Respiratory: Negative for cough, hemoptysis, shortness of breath and wheezing.    Cardiovascular: Negative for chest pain, palpitations, orthopnea and leg swelling.   Gastrointestinal: Negative for blood in stool, constipation, diarrhea, heartburn, nausea and vomiting.   Genitourinary: Negative for dysuria, frequency, hematuria and urgency.   Musculoskeletal: Positive for back pain. Negative for falls, joint pain and myalgias.   Skin: Negative for itching  and rash.   Neurological: Negative for dizziness, tingling, tremors, sensory change, focal weakness, seizures, loss of consciousness, weakness and headaches.   Endo/Heme/Allergies: Negative for environmental allergies and polydipsia. Does not bruise/bleed easily.   Psychiatric/Behavioral: Negative for depression, hallucinations, memory loss and suicidal ideas. The patient is nervous/anxious.            PHYSICAL EXAM:  Vitals:    09/24/14 1501   BP: 90/60   Pulse: 65   Resp: 14   Temp: 98.1 ??F (36.7 ??C)     General Appearance: well developed and well- nourished, in no acute distress  Head: normocephalic and atraumatic  Eyes: pupils equal, round, and reactive to light,  conjunctivae and eye lids without erythema  ENT: external ear and ear canal normal bilaterally, nose without deformity, nasal mucosa and turbinates normal without polyps, oropharynx normal, dentition is normal for age, no lip or gum lesions noted  Neck: supple and non-tender without mass, no thyromegaly or thyroid  nodules, no cervical lymphadenopathy  Pulmonary/Chest: clear to auscultation bilaterally- no wheezes, rales or rhonchi, normal air movement, no respiratory distress or retractions  Cardiovascular: normal rate, regular rhythm, normal S1 and S2, no murmurs, rubs, clicks, or gallops, distal pulses intact  Abdomen: soft, non-tender, non-distended, normal bowel sounds,  no rebound or guarding, no masses or hernias noted, no hepatospleenomegaly  Extremities: no cyanosis, clubbing or edema of the lower extremities  Musculoskeletal: No joint swelling or gross deformity   Neuro:  Alert, 5/5 strength globally and symmetrically, 2+ patellar reflexes b/l,  normal speech, no focal findings or movement disorder noted, gait wnl  Psych:  Normal affect without evidence of depression or anxiety, insight and judgement are appropriate, memory appears intact  Skin: warm and dry, there is a large soft mass of the left lumbar paraspinal area, patient states that this is a chronic issue  Lymph:  No cervical, auricular or supraclavicular lymph nodes palpated    ASSESSMENT & PLAN  Micheal [name] was seen today for established new doctor and anxiety.    Diagnoses and all orders for this visit:    Type 2 diabetes mellitus without complication, without long-term current use of insulin (HCC)  Orders:  -     rosuvastatin (CRESTOR) 40 MG tablet; Take 1 tablet by mouth every morning  -     fenofibrate (TRICOR) 145 MG tablet; Take 1 tablet by mouth daily  -     metFORMIN ER, MOD, (GLUMETZA) 500 MG ER tablet; Take 4 tablets by mouth daily (with breakfast)    Mixed hyperlipidemia    Essential hypertension  Orders:  -     metoprolol succinate ER (TOPROL-XL) 100 MG XL tablet; Take 1.5 tablets by mouth daily  -     lisinopril (PRINIVIL;ZESTRIL) 40 MG tablet; Take 1 tablet by mouth daily    Coronary artery disease of native heart with stable angina pectoris, unspecified vessel or lesion type (HCC)  Orders:  -     clopidogrel (PLAVIX) 75 MG tablet; Take 1 tablet by  mouth daily    GAD (generalized anxiety disorder)  Orders:  -     citalopram (CELEXA) 20 MG tablet; Take 1 tablet by mouth daily    Chronic bilateral low back pain with bilateral sciatica  Orders:  -     oxyCODONE-acetaminophen (PERCOCET) 10-325 MG per tablet; Take 1 tablet by mouth every 8 hours as needed for Pain    Gastroesophageal reflux disease without esophagitis  Orders:  -  esomeprazole (NEXIUM) 40 MG capsule; Take 1 capsule by mouth every morning (before breakfast)    Other orders  -     Cancel: oxyCODONE-acetaminophen (PERCOCET) 5-325 MG per tablet; Take 1 tablet by mouth every 6 hours as needed for Pain    Wean off of Klonopin using regimen below  Take 1 tab Of the Klonopin for twice per day x 1 week  Then take 1/2 tab of the Klonopin for twice per day for 1 week  Then take 1/2 tab once per day for 1 week  Then stop the Klonopin    Start Celexa for anxiety    Will continue Percocet for now for pain, but need all records, he claims that this mass on his back is chronic    Continue current meds, reeval in 3 months    Return in about 3 months (around 12/24/2014), or if symptoms worsen or fail to improve.      PATIENT COUNSELING      [name] received counseling on the following healthy behaviors: medication adherence    Patient counseled on new medications and medication changes.    Patient given educational materials on: See Attached    I have instructed [name] to complete a self tracking handout on Blood Pressures  and instructed them to bring it with them to his next appointment.     Barriers to learning and self management: none    Discussed use, benefit, and side effects of prescribed medications.  Barriers to medication compliance addressed.  All patient questions answered.  Pt voiced understanding.

## 2014-10-10 ENCOUNTER — Ambulatory Visit
Admit: 2014-10-10 | Discharge: 2014-10-10 | Payer: MEDICARE | Attending: Cardiovascular Disease | Primary: Family Medicine

## 2014-10-10 DIAGNOSIS — I25708 Atherosclerosis of coronary artery bypass graft(s), unspecified, with other forms of angina pectoris: Secondary | ICD-10-CM

## 2014-10-10 MED ORDER — RANOLAZINE ER 500 MG PO TB12
500 MG | ORAL_TABLET | Freq: Two times a day (BID) | ORAL | 3 refills | Status: DC
Start: 2014-10-10 — End: 2015-01-21

## 2014-10-10 NOTE — Progress Notes (Signed)
Pt is a one mth f/u  Says the isosorbide is making him have headaches and dizziness  No edema

## 2014-10-10 NOTE — Progress Notes (Signed)
[facility]  [facility]  [address]  [address]  [place] [address]  Dept: [phone]  Dept Fax: [phone]  Loc: [phone]    Visit Date: 10/10/2014    Chief Complaint   Patient presents with   ??? 1 Month Follow-Up       HPI:    Micheal [name] IS A 59 y.o. male known to have CAD s/p CABG s/p multiple PCIs, HTN, HL, DM is presenting to establish care. He was not able to tolerate the imdur. He is able to walk around 1 block with occasional chest discomfort. He has no orthopnea, occasional pnds, no lower extremity swelling; he has no early satiety.         MEDICAL DIAGNOSES  Patient Active Problem List    Diagnosis Date Noted   ??? GAD (generalized anxiety disorder) 09/10/2014   ??? 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 Essential hypertension 08/13/2014   ??? Mixed hyperlipidemia 08/13/2014   ??? Type 2 diabetes mellitus without complication, without long-term current use of insulin (HCC) 08/13/2014   ??? History of coronary artery stent placement 08/13/2014   ??? Hx of CABG 08/13/2014       No Known Allergies    Current Outpatient Prescriptions   Medication Sig Dispense Refill   ??? metoprolol succinate ER (TOPROL-XL) 100 MG XL tablet Take 1.5 tablets by mouth daily 45 tablet 5   ??? rosuvastatin (CRESTOR) 40 MG tablet Take 1 tablet by mouth every morning 30 tablet 5   ??? fenofibrate (TRICOR) 145 MG tablet Take 1 tablet by mouth daily 30 tablet 5   ??? lisinopril (PRINIVIL;ZESTRIL) 40 MG tablet Take 1 tablet by mouth daily 30 tablet 5   ??? clopidogrel (PLAVIX) 75 MG tablet Take 1 tablet by mouth daily 30 tablet 5   ??? esomeprazole (NEXIUM) 40 MG capsule Take 1 capsule by mouth every morning (before breakfast) 30 capsule 5   ??? metFORMIN ER, MOD, (GLUMETZA) 500 MG ER tablet Take 4 tablets by mouth daily (with breakfast) 120 tablet 5   ??? citalopram (CELEXA) 20 MG tablet Take 1 tablet by mouth daily 30 tablet 5   ??? oxyCODONE-acetaminophen (PERCOCET) 10-325  MG per tablet Take 1 tablet by mouth every 8 hours as needed for Pain 90 tablet 0   ??? colesevelam (WELCHOL) 3.75 G PACK Take 1 packet by mouth Daily with lunch 30 each 6   ??? aspirin 81 MG EC tablet Take 4 tablets by mouth daily 30 tablet 3   ??? isosorbide mononitrate (IMDUR) 30 MG CR tablet Take 0.5 tablets by mouth daily 30 tablet 3   ??? colchicine (COLCRYS) 0.6 MG tablet Take 0.6 mg by mouth 1 to 2 tablets as needed. May take 1 tablet 1-2 hours later if no improvement. Maximum of 3 tablets in 24 hours.     ??? olopatadine (PATANOL) 0.1 % ophthalmic solution Place 1 drop into both eyes 2 times daily     ??? fluticasone (FLONASE) 50 MCG/ACT nasal spray 2 sprays by Each Nare route daily     ??? Multiple Vitamins-Minerals (CENTRUM SILVER ULTRA MENS) TABS Take 1 tablet by mouth daily     ??? nitroGLYCERIN (NITROSTAT) 0.4 MG SL tablet Place 0.4 mg under the tongue every 5 minutes as needed for Chest pain       No current facility-administered medications for this visit.        Social  History     Social History   ??? Marital status: Married     Spouse name: N/A   ??? Number of children: N/A   ??? Years of education: N/A     Occupational History   ??? Not on file.     Social History Main Topics   ??? Smoking status: Former Smoker     Types: Cigarettes     Quit date: 05/24/2008   ??? Smokeless tobacco: Not on file   ??? Alcohol use 7.2 oz/week     12 Cans of beer per week      Comment: 12 beers daily   ??? Drug use: No   ??? Sexual activity: Yes     Other Topics Concern   ??? Not on file     Social History Narrative       Family History   Problem Relation Age of Onset   ??? Cancer Mother    ??? Heart Disease Brother    ??? Stroke Brother    ??? Diabetes Brother    ??? Early Death Brother    ??? High Blood Pressure Brother    ??? High Cholesterol Brother    ??? Mental Retardation Sister        Subjective:      Review of Systems    Objective:     Physical Exam  Wt Readings from Last 3 Encounters:   10/10/14 110 lb (49.9 kg)   09/24/14 200 lb 12.8 oz (91.1 kg)   09/12/14  199 lb (90.3 kg)     BP Readings from Last 3 Encounters:   10/10/14 97/63   09/24/14 90/60   09/12/14 116/66       Visit Vitals   ??? BP 97/63 (Site: Right Arm, Position: Sitting)   ??? Pulse 56   ??? Ht 5\' 7"  (1.702 m)   ??? Wt 110 lb (49.9 kg)   ??? BMI 17.23 kg/m2       General Appearance:  Alert, cooperative, no distress   Head:   without obvious abnormality   Throat: Lips, mucosa, and tongue normal; teeth and gums normal   Neck: Supple, symmetrical, trachea midline, no adenopathy, no carotid bruit or JVD   Lungs:   Clear to auscultation bilaterally, respirations unlabored   Chest Wall:  No tenderness or deformity   Heart:  Regular rate and rhythm, S1, S2 normal, no murmur, rub or gallop   Abdomen:   Soft, non-tender, bowel sounds active all four quadrants,  no masses, no organomegaly   Extremities: Extremities normal, atraumatic, no cyanosis or edema   Pulses: 2+ and symmetric   Skin: Skin color, texture, turgor normal, no rashes or lesions   Neurologic: Grossly physiologic       Lab Data  CBC:   Lab Results   Component Value Date    WBC 6.0 09/03/2014    RBC 5.01 09/03/2014    HGB 14.8 09/03/2014    HGB 15.7 05/24/2013    HCT 43.6 09/03/2014    MCV 87.1 09/03/2014    MCH 29.6 09/03/2014    MCHC 34.0 09/03/2014    RDW 13.5 09/03/2014    PLT 181 09/03/2014    PLT 204 05/24/2013    MPV 9.9 09/03/2014     CMP:    Lab Results   Component Value Date    NA 141 09/03/2014    K 4.2 09/03/2014    CL 100 09/03/2014    CO2 26 09/03/2014    BUN  18 09/03/2014    CREATININE 1.2 09/03/2014    CREATININE 1.1 05/24/2013    LABGLOM 62 09/03/2014    GLUCOSE 124 09/03/2014    PROT 8.3 09/03/2014    LABALBU 4.6 09/03/2014    CALCIUM 9.6 09/03/2014    BILITOT 0.7 09/03/2014    ALKPHOS 30 09/03/2014    AST 28 09/03/2014    ALT 39 09/03/2014     Hepatic Function Panel:    Lab Results   Component Value Date    ALKPHOS 30 09/03/2014    ALT 39 09/03/2014    AST 28 09/03/2014    PROT 8.3 09/03/2014    BILITOT 0.7 09/03/2014    LABALBU 4.6  09/03/2014     Magnesium:  No results found for: MG  PT/INR:  No results found for: PROTIME, INR  HgBA1c:    Lab Results   Component Value Date    LABA1C 6.9 09/03/2014     FLP:    Lab Results   Component Value Date    TRIG 82 09/03/2014    HDL 31 09/03/2014    LDLCALC 53 09/03/2014     TSH:    Lab Results   Component Value Date    TSH 3.000 09/03/2014     No results for input(s): CKTOTAL, CKMB, CKMBINDEX, TROPONINI in the last 72 hours.       EKG:         Assessment/Plan      Micheal [name] IS A 59 y.o. male known to have CAD s/p CABG s/p multiple PCIs, HTN, HL, DM is presenting to establish care  ????  1. CAD s/p CABG  ????  -stress test showed no ischemia  ????  -Patient was not able to tolerate the Imdur , stop Imdur    -Ranexa 500 mg twice daily  ??  -patient is still having chest pain and sob. HE was educated that it is important to do a heart cath since he has persistent chest pain even though stress test was normal. He was very reluctant to do the cath. HE wants to see how he feels with Ranexa. If he still has pain, he then wants to have a cath.   ????  2. HTN  ????  -controlled  ????  -will keep same medications  ????  3. HL  ????  -keep crestor   ????  -stop zetia  ??  -keep welchol  ????  RTC in 6 weeks  ??     Daleigh Pollinger K Cathryn Gallery  10/10/2014  12:00 PM

## 2014-10-23 ENCOUNTER — Encounter

## 2014-10-23 MED ORDER — OXYCODONE-ACETAMINOPHEN 10-325 MG PO TABS
10-325 MG | ORAL_TABLET | Freq: Three times a day (TID) | ORAL | 0 refills | Status: DC | PRN
Start: 2014-10-23 — End: 2014-11-21

## 2014-10-23 NOTE — Telephone Encounter (Signed)
Micheal Birminghamavid A Bollier called requesting a refill on the following medications:  Requested Prescriptions     Pending Prescriptions Disp Refills   ??? oxyCODONE-acetaminophen (PERCOCET) 10-325 MG per tablet 90 tablet 0     Sig: Take 1 tablet by mouth every 8 hours as needed for Pain     Pharmacy verified: [facility] [address]         Date of last visit: 09/24/14  Date of next visit (if applicable): 12/24/2014    Status of current medication:     Routine refill  [phone]

## 2014-10-23 NOTE — Telephone Encounter (Signed)
Controlled Substances Monitoring: Attestation: The Prescription Monitoring Report for this patient was reviewed today. (Nina Hoar D Tierra Divelbiss, DO)  Documentation: No signs of potential drug abuse or diversion identified. (Sarah Baez D Minnetta Sandora, DO)

## 2014-10-27 NOTE — Telephone Encounter (Signed)
Pt called stating he is needing paper work filled out so he is able to get a 2 bedroom apartment for his work out equipment. Pt states he is unable to go outside during the cold and heat due to his heart condition. Pt was told we needed his disability papers prior to filling out the disability verification form. Pt verbally understood and stated he would have hid lawyer fax his disability papers to the office. The disability verification form is on phone tray #2.

## 2014-10-28 ENCOUNTER — Encounter: Payer: MEDICARE | Attending: Family Medicine | Primary: Family Medicine

## 2014-10-29 NOTE — Telephone Encounter (Signed)
Pt informed that the notes from the lawyer were received today. All paperwork has been placed in Dr. Myrla Halsted tray.

## 2014-11-21 ENCOUNTER — Ambulatory Visit
Admit: 2014-11-21 | Discharge: 2014-11-21 | Payer: MEDICARE | Attending: Cardiovascular Disease | Primary: Family Medicine

## 2014-11-21 ENCOUNTER — Telehealth

## 2014-11-21 DIAGNOSIS — I10 Essential (primary) hypertension: Secondary | ICD-10-CM

## 2014-11-21 MED ORDER — OXYCODONE-ACETAMINOPHEN 10-325 MG PO TABS
10-325 MG | ORAL_TABLET | Freq: Three times a day (TID) | ORAL | 0 refills | Status: DC | PRN
Start: 2014-11-21 — End: 2014-12-22

## 2014-11-21 MED ORDER — OXYCODONE-ACETAMINOPHEN 10-325 MG PO TABS
10-325 MG | ORAL_TABLET | Freq: Three times a day (TID) | ORAL | 0 refills | Status: DC | PRN
Start: 2014-11-21 — End: 2014-11-21

## 2014-11-21 NOTE — Telephone Encounter (Signed)
Patient informed and voiced understanding.

## 2014-11-21 NOTE — Progress Notes (Signed)
Pt is a former pt of dr Heron Naykhalil  Says the ranexa is making him feel better and not as much cp  Sob is better   Denies any lt headed or dizziness and no edema

## 2014-11-21 NOTE — Telephone Encounter (Signed)
I sent in an rx, he needs to destroy the Rx from Dr Gabrielle DareBaki

## 2014-11-21 NOTE — Progress Notes (Signed)
[facility]  [address]  [address]  [place] [address]  Dept: [phone]  Dept Fax: [phone]  Loc: [phone]    Visit Date: 11/21/2014    Micheal [name] is a 59 y.o. male who presents today for:  Chief Complaint   Patient presents with   ??? 1 Month Follow-Up   ??? Coronary Artery Disease   ??? Hypertension   ??? Hyperlipidemia   here for follow up from [facility] and stents in [place]  Moved to [place] lately   Seen Heron Nay  No chest pain  Some dyspnea   Had some chronic chest pain  Better with Renexa  Stress test by Heron Nay with no ischemia         HPI:  HPI  Past Medical History   Diagnosis Date   ??? CAD (coronary artery disease)    ??? Diabetes mellitus (HCC)    ??? Hyperlipidemia    ??? Hypertension       Past Surgical History   Procedure Laterality Date   ??? Coronary artery bypass graft     ??? Orthopedic surgery Left      Family History   Problem Relation Age of Onset   ??? Cancer Mother    ??? Heart Disease Brother    ??? Stroke Brother    ??? Diabetes Brother    ??? Early Death Brother    ??? High Blood Pressure Brother    ??? High Cholesterol Brother    ??? Mental Retardation Sister      Social History   Substance Use Topics   ??? Smoking status: Former Smoker     Types: Cigarettes     Quit date: 05/24/2008   ??? Smokeless tobacco: Not on file   ??? Alcohol use 7.2 oz/week     12 Cans of beer per week      Comment: 12 beers daily      Current Outpatient Prescriptions   Medication Sig Dispense Refill   ??? oxyCODONE-acetaminophen (PERCOCET) 10-325 MG per tablet Take 1 tablet by mouth every 8 hours as needed for Pain 90 tablet 0   ??? ranolazine (RANEXA) 500 MG extended release tablet Take 1 tablet by mouth 2 times daily 60 tablet 3   ??? metoprolol succinate ER (TOPROL-XL) 100 MG XL tablet Take 1.5 tablets by mouth daily 45 tablet 5   ??? rosuvastatin (CRESTOR) 40 MG tablet Take 1 tablet by mouth every morning 30 tablet 5   ??? fenofibrate (TRICOR) 145 MG tablet Take 1 tablet by mouth daily 30 tablet 5   ???  lisinopril (PRINIVIL;ZESTRIL) 40 MG tablet Take 1 tablet by mouth daily 30 tablet 5   ??? clopidogrel (PLAVIX) 75 MG tablet Take 1 tablet by mouth daily 30 tablet 5   ??? esomeprazole (NEXIUM) 40 MG capsule Take 1 capsule by mouth every morning (before breakfast) 30 capsule 5   ??? metFORMIN ER, MOD, (GLUMETZA) 500 MG ER tablet Take 4 tablets by mouth daily (with breakfast) 120 tablet 5   ??? citalopram (CELEXA) 20 MG tablet Take 1 tablet by mouth daily 30 tablet 5   ??? colesevelam [facility]) 3.75 G PACK Take 1 packet by mouth Daily with lunch 30 each 6   ??? aspirin 81 MG EC tablet Take 4 tablets by mouth daily 30 tablet 3   ??? colchicine (COLCRYS) 0.6 MG tablet Take 0.6 mg by mouth 1 to 2 tablets as needed. May  take 1 tablet 1-2 hours later if no improvement. Maximum of 3 tablets in 24 hours.     ??? olopatadine (PATANOL) 0.1 % ophthalmic solution Place 1 drop into both eyes 2 times daily     ??? fluticasone (FLONASE) 50 MCG/ACT nasal spray 2 sprays by Each Nare route daily     ??? Multiple Vitamins-Minerals (CENTRUM SILVER ULTRA MENS) TABS Take 1 tablet by mouth daily     ??? nitroGLYCERIN (NITROSTAT) 0.4 MG SL tablet Place 0.4 mg under the tongue every 5 minutes as needed for Chest pain       No current facility-administered medications for this visit.      No Known Allergies  Health Maintenance   Topic Date Due   ??? Hepatitis C screen  December 01, 1955   ??? Diabetic foot exam  06/11/1964   ??? Diabetic retinal exam  06/11/1964   ??? HIV screen  06/11/1969   ??? DTaP/Tdap/Td vaccine (1 - Tdap) 06/11/1973   ??? Zostavax vaccine  06/12/2014   ??? Flu vaccine (1) 08/25/2014   ??? Diabetic hemoglobin A1C test  03/06/2015   ??? Diabetic microalbuminuria test  09/03/2015   ??? Lipid screen  09/03/2015   ??? PNEUMOVAX 1 DOSE 19-64Y (2) 06/12/2019   ??? Colon cancer screen colonoscopy  07/25/2023       Subjective:  Review of Systems  General:   No fever, no chills, No fatigue or weight loss  Pulmonary:    No dyspnea, no wheezing  Cardiac:    chronic  chest pain,   GI:      No nausea or vomiting, no abdominal pain  Neuro:    No dizziness or light headedness,   Musculoskeletal:  No recent active issues  Extremities:   No edema, good peripheral pulses      Objective:  Physical Exam  Visit Vitals   ??? BP 114/72   ??? Pulse 60   ??? Ht 5\' 7"  (1.702 m)   ??? Wt 190 lb 1.6 oz (86.2 kg)   ??? BMI 29.77 kg/m2   General:   Well developed, well nourished  Lungs:   Clear to auscultation  Heart:    Normal S1 S2, Slight murmur. no rubs, no gallops  Abdomen:   Soft, non tender, no organomegalies, positive bowel sounds  Extremities:   No edema, no cyanosis, good peripheral pulses  Neurological:   Awake, alert, oriented. No obvious focal deficits  Musculoskelatal:  No obvious deformities      Assessment:     1. Essential hypertension     2. Coronary artery disease involving coronary bypass graft of native heart without angina pectoris     3. Familial hypercholesterolemia     cardiac stable   No clear indication for cath yet     Plan:  No Follow-up on file.  As above  Will cath if not responding to meds   Continue risk factor modification and medical management  Thank you for allowing me to participate in the care of your patient. Please don't hesitate to contact me regarding any further issues related to the patient care    Orders Placed:  No orders of the defined types were placed in this encounter.      Medications Prescribed:  No orders of the defined types were placed in this encounter.         Discussed use, benefit, and side effects of prescribed medications. All patient questions answered. Pt voiced understanding. Instructed to continue current medications, diet and  exercise. Continue risk factor modification and medical management. Patient agreed with treatment plan. Follow up as directed.    Electronically signed by [name] BalboaZoheir A Jerelene Salaam, MD on 11/21/2014 at 7:52 AM

## 2014-11-21 NOTE — Telephone Encounter (Signed)
Patient calling in for a refill of his percocet. He was just seen by his heart doctor, Dr Gabrielle DareBaki, this morning and given a written prescription for 30 tablets of his percocet. Patient calling in wanting to know if he can just rip up that written rx and have Dr Ulyess MortKahle resend 90 tablets to [facility] on [address] like he normally does for the patient. Please call him back and advise.

## 2014-12-01 NOTE — Telephone Encounter (Signed)
LMOVM (ok per [employer]) regarding letter from [facility], per Dr Ulyess MortKahle the patient will need to sign a records release form before our office can share any info.  Letter is in the top black tray by Desk Out.

## 2014-12-01 NOTE — Telephone Encounter (Signed)
Patient came in the office and signed a records release form, put in Dr Myrla HalstedKahle's top tray.

## 2014-12-02 NOTE — Telephone Encounter (Signed)
[name] with [employer] called in Re:to the letter you sent them yesterday... She states they need you to expand on why it is necessary for the pt to have an entire room for exercise equipment & what the correlation is between the weather & his heart condition. She states they would need to know why, medically, he would need to exercise & how often/what type of exercise he needs to do. She states if they can not prove it is medically necessary they will not be able to justify paying for an entire room simply for exercise equipment. She would like the information faxed to her @ [phone].

## 2014-12-02 NOTE — Telephone Encounter (Signed)
Micheal [name] was notified.

## 2014-12-02 NOTE — Telephone Encounter (Signed)
I understand their concerns and I cannot further justify this for the patient.  It is not medically necessary for him to have the equipment in his house.  It is recommended that he get regular physical activity due to his heart condition.

## 2014-12-22 ENCOUNTER — Encounter

## 2014-12-22 MED ORDER — OXYCODONE-ACETAMINOPHEN 10-325 MG PO TABS
10-325 MG | ORAL_TABLET | Freq: Three times a day (TID) | ORAL | 0 refills | Status: DC | PRN
Start: 2014-12-22 — End: 2015-01-20

## 2014-12-22 NOTE — Telephone Encounter (Signed)
Per HIPAA form a VM was left notifying the pt.

## 2014-12-22 NOTE — Telephone Encounter (Signed)
Patient is requesting a refill on his percocet 10/325 tid, not on medication list.  [facility] [address]  DOLV: 09/24/14 DONV: 12/24/14

## 2014-12-22 NOTE — Telephone Encounter (Signed)
Sent    Controlled Substances Monitoring: Attestation: The Prescription Monitoring Report for this patient was reviewed today. (Caraline Deutschman D Arriyanna Mersch, DO)  Documentation: No signs of potential drug abuse or diversion identified. (Marrion Accomando D Mildred Bollard, DO)

## 2014-12-24 ENCOUNTER — Ambulatory Visit: Admit: 2014-12-24 | Discharge: 2014-12-24 | Payer: MEDICARE | Attending: Family Medicine | Primary: Family Medicine

## 2014-12-24 DIAGNOSIS — E119 Type 2 diabetes mellitus without complications: Secondary | ICD-10-CM

## 2014-12-24 LAB — POCT GLYCOSYLATED HEMOGLOBIN (HGB A1C): Hemoglobin A1C: 5.9 %

## 2014-12-24 MED ORDER — BUSPIRONE HCL 5 MG PO TABS
5 MG | ORAL_TABLET | Freq: Two times a day (BID) | ORAL | 5 refills | Status: DC
Start: 2014-12-24 — End: 2015-05-15

## 2014-12-24 NOTE — Progress Notes (Signed)
Have you seen any other physician or provider since your last visit no    Have you had any other diagnostic tests since your last visit? no    Have you changed or stopped any medications since your last visit including any over-the-counter medicines, vitamins, or herbal medicines? no     Are you taking all your prescribed medications? Yes    If NO, why?

## 2014-12-24 NOTE — Progress Notes (Signed)
Micheal [name] is a 59 y.o. male that presents for 3 Month Follow-Up and Anxiety (states the Celexa he was given for anxiety is not working so he stopped the medication one month ago)        HPI:    Stopped the Celexa due to feeling like it made his anxiety worse.  States that since then, his anxiety is improved but still present.  Notes that he is avoiding certain situations due to the anxiety.  Sleep is a bit fractured.    Urinary Symptoms    HPI:      Symptoms have been present for several  year(s).  Difficulty initiating a stream? No  Weak urinary stream?  No  Dysuria? No  Nocturia?  Yes - 2-3 times per night  Hematuria?  No  Perineal discomfort? No      Diabetes Type 2    Glucose control:   Does patient check blood glucoses at home?  Yes - FBG <130  Report of hypoglycemia: no  Lab Results   Component Value Date    LABA1C 5.9 12/24/2014     No results found for: EAG    Symptoms  Polyuria, Polydipsia or Polyphagia?   No  Chest Pain, SOB, or Palpitations? -  No  New Vision complaints? No  Paresthesias of the extremities?  No    Medications  Current medication were reviewed.  Compliant with medications?  yes  Medication side effects?  No  On ACE-I or ARB?  Yes  On antiplatelet therapy?  Yes  On Statin?  Yes      Exercise  Exercise? No  Wt Readings from Last 3 Encounters:   12/24/14 195 lb (88.5 kg)   11/21/14 190 lb 1.6 oz (86.2 kg)   10/10/14 110 lb (49.9 kg)       Diet discipline?:  Low salt, fat, sugar diet?  no    Blood pressure control:  BP Readings from Last 3 Encounters:   12/24/14 120/80   11/21/14 114/72   10/10/14 97/63       Lab Results   Component Value Date    LABMICR 1.28 09/03/2014       Lab Results   Component Value Date    LDLCALC 53 09/03/2014       Chronic Pain    HPI:    Patient has chronic pain of the low back.  Present for: approximately 5 years  Current Medications:  Percocet, states that this helps to make him more functional  Pain control: adequate  Escalation of dosage recently?   no    Evidence for abuse or diversion? no   Tobacco use?: no    Seen specialist or pain clinic?: no        Health Maintenance   Topic Date Due   ??? Hepatitis C screen  [birth year]   ??? Diabetic foot exam  06/11/1964   ??? Diabetic retinal exam  06/11/1964   ??? HIV screen  06/11/1969   ??? DTaP/Tdap/Td vaccine (1 - Tdap) 06/11/1973   ??? Zostavax vaccine  06/12/2014   ??? Diabetic hemoglobin A1C test  03/06/2015   ??? Diabetic microalbuminuria test  09/03/2015   ??? Lipid screen  09/03/2015   ??? PNEUMOVAX 1 DOSE 19-64Y (2) 06/12/2019   ??? Colon cancer screen colonoscopy  07/25/2023   ??? Flu vaccine  Completed       Past Medical History   Diagnosis Date   ??? CAD (coronary artery disease)    ??? Diabetes  mellitus (HCC)    ??? Hyperlipidemia    ??? Hypertension        Past Surgical History   Procedure Laterality Date   ??? Coronary artery bypass graft     ??? Orthopedic surgery Left        Social History   Substance Use Topics   ??? Smoking status: Former Smoker     Types: Cigarettes     Quit date: 05/24/2008   ??? Smokeless tobacco: None   ??? Alcohol use 7.2 oz/week     12 Cans of beer per week      Comment: 12 beers daily       Family History   Problem Relation Age of Onset   ??? Cancer Mother    ??? Heart Disease Brother    ??? Stroke Brother    ??? Diabetes Brother    ??? Early Death Brother    ??? High Blood Pressure Brother    ??? High Cholesterol Brother    ??? Mental Retardation Sister          I have reviewed the patient's past medical history, past surgical history, allergies, medications, social and family history and I have made updates where appropriate.    ROS        PHYSICAL EXAM:  Visit Vitals   ??? BP 120/80   ??? Pulse 80   ??? Temp 97.9 ??F (36.6 ??C) (Oral)   ??? Resp 12   ??? Ht 5' 6.5" (1.689 m)   ??? Wt 195 lb (88.5 kg)   ??? BMI 31 kg/m2       General Appearance: well developed and well- nourished, in no acute distress  Head: normocephalic and atraumatic  ENT: external ear and ear canal normal bilaterally, nose without deformity, nasal mucosa and turbinates normal  without polyps, oropharynx normal, dentition is normal for age, no lip or gum lesions noted  Neck: supple and non-tender without mass, no thyromegaly or thyroid nodules, no cervical lymphadenopathy  Pulmonary/Chest: clear to auscultation bilaterally- no wheezes, rales or rhonchi, normal air movement, no respiratory distress or retractions  Cardiovascular: normal rate, regular rhythm, normal S1 and S2, no murmurs, rubs, clicks, or gallops, distal pulses intact  Abdomen: soft, non-tender, non-distended, no rebound or guarding, no masses or hernias noted, no hepatospleenomegaly  Extremities: no cyanosis, clubbing or edema of the lower extremities  Psych:  Normal affect without evidence of depression or anxiety, insight and judgement are appropriate, memory appears intact  Skin: warm and dry, no rash or erythema      ASSESSMENT & PLAN  [name] was seen today for 3 month follow-up and anxiety.    Diagnoses and all orders for this visit:    Type 2 diabetes mellitus without complication, without long-term current use of insulin (HCC)  -     POCT glycosylated hemoglobin (Hb A1C)    Essential hypertension    GAD (generalized anxiety disorder)  -     busPIRone (BUSPAR) 5 MG tablet; Take 1 tablet by mouth 2 times daily    -Start Buspar for GAD  -A1c well controlled, continue current meds  -Urinary sx more consistent with OAB than BPH, he is declining further eval or tx unless sx worsen    Return in about 6 months (around 06/23/2015), or if symptoms worsen or fail to improve.      PATIENT COUNSELING      Micheal [name] received counseling on the following healthy behaviors: medication adherence    Patient  counseled on new medications and medication changes.    Patient given educational materials on: See Attached    I have instructed Micheal [name] to complete a self tracking handout on Blood Pressures  and instructed them to bring it with them to his next appointment.     Barriers to learning and self management: none    Discussed  use, benefit, and side effects of prescribed medications.  Barriers to medication compliance addressed.  All patient questions answered.  Pt voiced understanding.

## 2015-01-20 ENCOUNTER — Encounter

## 2015-01-20 MED ORDER — OXYCODONE-ACETAMINOPHEN 10-325 MG PO TABS
10-325 MG | ORAL_TABLET | Freq: Three times a day (TID) | ORAL | 0 refills | Status: DC | PRN
Start: 2015-01-20 — End: 2015-02-19

## 2015-01-20 NOTE — Telephone Encounter (Signed)
Micheal Birminghamavid A Teaney called requesting a refill on the following medications:  Requested Prescriptions     Pending Prescriptions Disp Refills   ??? oxyCODONE-acetaminophen (PERCOCET) 10-325 MG per tablet 90 tablet 0     Sig: Take 1 tablet by mouth every 8 hours as needed for Pain .     Pharmacy verified:  .pv        Date of last visit: 12/24/14  Date of next visit (if applicable): 06/23/2015    Status of current medication:     Patient is low on medication  Has one left

## 2015-01-20 NOTE — Telephone Encounter (Signed)
Controlled Substances Monitoring: Attestation: The Prescription Monitoring Report for this patient was reviewed today. (Julies Carmickle D Leean Amezcua, DO)  Documentation: No signs of potential drug abuse or diversion identified. (Amyiah Gaba D Thayer Inabinet, DO)

## 2015-01-21 ENCOUNTER — Encounter

## 2015-01-21 MED ORDER — RANOLAZINE ER 500 MG PO TB12
500 MG | ORAL_TABLET | Freq: Two times a day (BID) | ORAL | 3 refills | Status: DC
Start: 2015-01-21 — End: 2016-05-06

## 2015-02-13 ENCOUNTER — Encounter: Attending: Family | Primary: Family Medicine

## 2015-02-19 ENCOUNTER — Encounter

## 2015-02-19 NOTE — Telephone Encounter (Signed)
Micheal [name] called requesting a refill on the following medications:  Requested Prescriptions     Pending Prescriptions Disp Refills   ??? oxyCODONE-acetaminophen (PERCOCET) 10-325 MG per tablet 90 tablet 0     Sig: Take 1 tablet by mouth every 8 hours as needed for Pain .     Pharmacy verified:  .pv        Date of last visit: 12/24/14  Date of next visit (if applicable): 06/23/2015    Status of current medication:     Patient is low on medication

## 2015-02-20 MED ORDER — OXYCODONE-ACETAMINOPHEN 10-325 MG PO TABS
10-325 MG | ORAL_TABLET | Freq: Three times a day (TID) | ORAL | 0 refills | Status: DC | PRN
Start: 2015-02-20 — End: 2015-03-20

## 2015-02-20 NOTE — Telephone Encounter (Signed)
Controlled Substances Monitoring: Attestation: The Prescription Monitoring Report for this patient was reviewed today. (Chantea Surace D Joniyah Mallinger, DO)  Documentation: No signs of potential drug abuse or diversion identified. (Lorrene Graef D Dolan Xia, DO)

## 2015-03-02 ENCOUNTER — Telehealth

## 2015-03-02 MED ORDER — HANDICAP PLACARD
0 refills | Status: DC
Start: 2015-03-02 — End: 2020-06-30

## 2015-03-02 NOTE — Telephone Encounter (Signed)
Rx printed

## 2015-03-02 NOTE — Telephone Encounter (Signed)
Patient states he needs a handicapped parking permit.  He gets out of breath when he has to walk to far.

## 2015-03-02 NOTE — Telephone Encounter (Signed)
Called and notified patient of [name], DO's comments and recommendations. Verbalized understanding.    Advised patient to have photo ID on him when he comes to pick it up. Verbalized understanding.

## 2015-03-19 MED ORDER — FLUTICASONE PROPIONATE 50 MCG/ACT NA SUSP
50 | Freq: Every day | NASAL | 5 refills | 60.00000 days | Status: DC
Start: 2015-03-19 — End: 2015-08-24

## 2015-03-19 MED ORDER — OLOPATADINE HCL 0.1 % OP SOLN
0.1 | Freq: Two times a day (BID) | OPHTHALMIC | 5 refills | Status: DC
Start: 2015-03-19 — End: 2015-08-24

## 2015-03-19 NOTE — Telephone Encounter (Signed)
Micheal [name] called requesting a refill on the following medications:  Requested Prescriptions     Pending Prescriptions Disp Refills   ??? fluticasone (FLONASE) 50 MCG/ACT nasal spray 1 Bottle      Sig: 2 sprays daily   ??? olopatadine (PATANOL) 0.1 % ophthalmic solution       Sig: Place 1 drop into both eyes 2 times daily     Pharmacy verified:  .pv        Date of last visit: 12/24/14  Date of next visit (if applicable): 06/23/2015    Status of current medication:     Routine Refill

## 2015-03-20 ENCOUNTER — Encounter

## 2015-03-20 MED ORDER — OXYCODONE-ACETAMINOPHEN 10-325 MG PO TABS
10-325 | ORAL_TABLET | Freq: Three times a day (TID) | ORAL | 0 refills | 5.00000 days | Status: DC | PRN
Start: 2015-03-20 — End: 2015-04-17

## 2015-03-20 NOTE — Telephone Encounter (Signed)
Controlled Substances Monitoring: Attestation: The Prescription Monitoring Report for this patient was reviewed today. (Gunnard Dorrance D Paz Winsett, DO)  Documentation: No signs of potential drug abuse or diversion identified. (Jolynn Bajorek D Jessey Huyett, DO)

## 2015-03-20 NOTE — Telephone Encounter (Signed)
[name] called requesting a refill on the following medications:  Requested Prescriptions     Pending Prescriptions Disp Refills   ??? oxyCODONE-acetaminophen (PERCOCET) 10-325 MG per tablet 90 tablet 0     Sig: Take 1 tablet by mouth every 8 hours as needed for Pain .     Pharmacy verified:  .pv        Date of last visit: 12/24/14  Date of next visit (if applicable): 06/23/2015    Status of current medication:     Routine Refill

## 2015-04-02 ENCOUNTER — Encounter

## 2015-04-02 MED ORDER — NEXIUM 40 MG PO CPDR
40 MG | ORAL_CAPSULE | ORAL | 5 refills | Status: DC
Start: 2015-04-02 — End: 2015-09-01

## 2015-04-17 ENCOUNTER — Encounter

## 2015-04-17 MED ORDER — OXYCODONE-ACETAMINOPHEN 10-325 MG PO TABS
10-325 | ORAL_TABLET | Freq: Three times a day (TID) | ORAL | 0 refills | 5.00000 days | Status: DC | PRN
Start: 2015-04-17 — End: 2015-05-15

## 2015-04-17 NOTE — Telephone Encounter (Signed)
Patient calling in to see if Dr Ulyess MortKahle will prescribe him something for itchy eyes, scratchy throat, and his ears are plugged. He has had the symptoms for about 2 weeks now. At time of call no appointments left for today. He also needs his percocet refilled also. His pharmacy is [facility] on [address]. Please advise.

## 2015-04-17 NOTE — Telephone Encounter (Signed)
Called and left detailed message (okay per HIPAA) to notify patient of Cecille RubinMark D Kahle, DO's comments and recommendations.    Advised patient to call back with update of what he is taking.

## 2015-04-17 NOTE — Telephone Encounter (Signed)
His symptoms sound allergy related, is he still using the flonase?    I will send the Percocet to the pharmacy.    Controlled Substances Monitoring: Attestation: The Prescription Monitoring Report for this patient was reviewed today. [name](Shenay Torti D Salvator Seppala, DO)  Documentation: No signs of potential drug abuse or diversion identified. [name](Tiesha Marich D Fiza Nation, DO)

## 2015-04-17 NOTE — Telephone Encounter (Signed)
Patient called back and left message stating that he is using the Flonase and still having symptoms.

## 2015-04-26 ENCOUNTER — Encounter

## 2015-04-27 MED ORDER — LISINOPRIL 40 MG PO TABS
40 MG | ORAL_TABLET | ORAL | 5 refills | Status: DC
Start: 2015-04-27 — End: 2015-09-21

## 2015-04-27 MED ORDER — FENOFIBRATE 145 MG PO TABS
145 MG | ORAL_TABLET | ORAL | 5 refills | Status: DC
Start: 2015-04-27 — End: 2015-09-21

## 2015-04-27 MED ORDER — ROSUVASTATIN CALCIUM 40 MG PO TABS
40 MG | ORAL_TABLET | ORAL | 5 refills | Status: DC
Start: 2015-04-27 — End: 2015-09-21

## 2015-04-27 MED ORDER — METOPROLOL SUCCINATE ER 100 MG PO TB24
100 MG | ORAL_TABLET | ORAL | 5 refills | Status: DC
Start: 2015-04-27 — End: 2015-09-21

## 2015-04-27 MED ORDER — CLOPIDOGREL BISULFATE 75 MG PO TABS
75 MG | ORAL_TABLET | ORAL | 5 refills | Status: DC
Start: 2015-04-27 — End: 2015-09-21

## 2015-05-15 ENCOUNTER — Encounter

## 2015-05-15 MED ORDER — BUSPIRONE HCL 5 MG PO TABS
5 | ORAL_TABLET | Freq: Two times a day (BID) | ORAL | 5 refills | 60.00000 days | Status: DC
Start: 2015-05-15 — End: 2015-06-11

## 2015-05-15 MED ORDER — OXYCODONE-ACETAMINOPHEN 10-325 MG PO TABS
10-325 | ORAL_TABLET | Freq: Three times a day (TID) | ORAL | 0 refills | Status: DC | PRN
Start: 2015-05-15 — End: 2015-06-10

## 2015-05-15 NOTE — Telephone Encounter (Signed)
Micheal [name] called requesting a refill on the following medications:  Requested Prescriptions     Pending Prescriptions Disp Refills   ??? busPIRone (BUSPAR) 5 MG tablet 60 tablet 5     Sig: Take 1 tablet by mouth 2 times daily   ??? oxyCODONE-acetaminophen (PERCOCET) 10-325 MG per tablet 90 tablet 0     Sig: Take 1 tablet by mouth every 8 hours as needed for Pain .     Pharmacy verified:  .pv        Date of last visit:  12/24/14  Date of next visit (if applicable): 06/23/2015    Status of current medication:     Patient is low on medication - PT WILL BE OUT ON SUNDAY

## 2015-05-15 NOTE — Telephone Encounter (Signed)
Controlled Substances Monitoring: Attestation: The Prescription Monitoring Report for this patient was reviewed today. (Tashae Inda D Niyam Bisping, DO)  Documentation: No signs of potential drug abuse or diversion identified. (Andreanna Mikolajczak D Kyzen Horn, DO)

## 2015-05-22 ENCOUNTER — Encounter: Attending: Cardiovascular Disease | Primary: Family Medicine

## 2015-06-01 ENCOUNTER — Encounter

## 2015-06-01 ENCOUNTER — Telehealth

## 2015-06-01 MED ORDER — METFORMIN HCL 1000 MG PO TABS
1000 MG | ORAL_TABLET | Freq: Two times a day (BID) | ORAL | 5 refills | Status: DC
Start: 2015-06-01 — End: 2015-07-29

## 2015-06-01 MED ORDER — METFORMIN HCL ER (MOD) 500 MG PO TB24
500 | ORAL_TABLET | Freq: Every day | ORAL | 5 refills | Status: DC
Start: 2015-06-01 — End: 2015-06-01

## 2015-06-01 NOTE — Telephone Encounter (Signed)
[facility] for Metformin ER 500 mg (Glumetza) stating this cost over $6000.  Requesting script for generic  Glucophage 500 mg.  (see attached correspondence)

## 2015-06-01 NOTE — Telephone Encounter (Signed)
Sent to pharmacy

## 2015-06-08 ENCOUNTER — Ambulatory Visit
Admit: 2015-06-08 | Discharge: 2015-06-08 | Payer: MEDICARE | Attending: Cardiovascular Disease | Primary: Family Medicine

## 2015-06-08 DIAGNOSIS — I10 Essential (primary) hypertension: Secondary | ICD-10-CM

## 2015-06-08 NOTE — Progress Notes (Signed)
[facility]  [address]  [address]  [place] [id number]  Dept: [phone]  Dept Fax: [phone]  Loc: [phone]    Visit Date: 06/08/2015    Micheal BirminghamDavid A [name] is a 60 y.o. male who presents today for:  Chief Complaint   Patient presents with   ??? Coronary Artery Disease   ??? Hypertension   ??? Hyperlipidemia     Known CABg and stents in [place]  Chronic angina  Chronic use of nitro   Intermittent   On medical RX  Chronic symptoms for most part  No new symptoms      HPI:  HPI  Past Medical History:   Diagnosis Date   ??? CAD (coronary artery disease)    ??? Diabetes mellitus (HCC)    ??? Hyperlipidemia    ??? Hypertension       Past Surgical History:   Procedure Laterality Date   ??? CORONARY ARTERY BYPASS GRAFT     ??? ORTHOPEDIC SURGERY Left      Family History   Problem Relation Age of Onset   ??? Cancer Mother    ??? Heart Disease Brother    ??? Stroke Brother    ??? Diabetes Brother    ??? Early Death Brother    ??? High Blood Pressure Brother    ??? High Cholesterol Brother    ??? Mental Retardation Sister      Social History   Substance Use Topics   ??? Smoking status: Former Smoker     Types: Cigarettes     Quit date: 05/24/2008   ??? Smokeless tobacco: Not on file   ??? Alcohol use 7.2 oz/week     12 Cans of beer per week      Comment: 12 beers daily      Current Outpatient Prescriptions   Medication Sig Dispense Refill   ??? metFORMIN (GLUCOPHAGE) 1000 MG tablet Take 1 tablet by mouth 2 times daily (with meals) 60 tablet 5   ??? busPIRone (BUSPAR) 5 MG tablet Take 1 tablet by mouth 2 times daily 60 tablet 5   ??? oxyCODONE-acetaminophen (PERCOCET) 10-325 MG per tablet Take 1 tablet by mouth every 8 hours as needed for Pain . 90 tablet 0   ??? lisinopril (PRINIVIL;ZESTRIL) 40 MG tablet TAKE 1 TABLET BY MOUTH DAILY 30 tablet 5   ??? fenofibrate (TRICOR) 145 MG tablet TAKE 1 TABLET BY MOUTH DAILY 30 tablet 5   ??? metoprolol succinate (TOPROL XL) 100 MG extended release tablet TAKE 1 AND 1/2 TABLETS BY MOUTH DAILY 45  tablet 5   ??? clopidogrel (PLAVIX) 75 MG tablet TAKE 1 TABLET BY MOUTH DAILY 30 tablet 5   ??? rosuvastatin (CRESTOR) 40 MG tablet TAKE 1 TABLET BY MOUTH EVERY MORNING 30 tablet 5   ??? NEXIUM 40 MG delayed release capsule TAKE 1 CAPSULE BY MOUTH EVERY MORNING BEFORE BREAKFAST 30 capsule 5   ??? fluticasone (FLONASE) 50 MCG/ACT nasal spray 2 sprays by Nasal route daily 1 Bottle 5   ??? olopatadine (PATANOL) 0.1 % ophthalmic solution Place 1 drop into both eyes 2 times daily 1 Bottle 5   ??? Handicap Placard MISC by Does not apply route Expires 03/01/2020 1 each 0   ??? ranolazine (RANEXA) 500 MG extended release tablet Take 1 tablet by mouth 2 times daily 180 tablet 3   ??? colesevelam [facility](WELCHOL) 3.75 G PACK Take 1 packet by mouth Daily with lunch 30 each  6   ??? Multiple Vitamins-Minerals (CENTRUM SILVER ULTRA MENS) TABS Take 1 tablet by mouth daily     ??? nitroGLYCERIN (NITROSTAT) 0.4 MG SL tablet Place 0.4 mg under the tongue every 5 minutes as needed for Chest pain       No current facility-administered medications for this visit.      No Known Allergies  Health Maintenance   Topic Date Due   ??? Hepatitis C screen  November 14, 1955   ??? Diabetic foot exam  06/11/1964   ??? Diabetic retinal exam  06/11/1964   ??? HIV screen  06/11/1969   ??? DTaP/Tdap/Td vaccine (1 - Tdap) 06/11/1973   ??? Zostavax vaccine  06/12/2014   ??? Diabetic hemoglobin A1C test  06/23/2015   ??? Diabetic microalbuminuria test  09/03/2015   ??? Lipid screen  09/03/2015   ??? Colon cancer screen colonoscopy  07/25/2023   ??? Flu vaccine  Completed   ??? Pneumococcal med risk  Completed       Subjective:  Review of Systems  General:   No fever, no chills, No fatigue or weight loss  Pulmonary:    No dyspnea, no wheezing  Cardiac:    Denies recent chest pain,   GI:     No nausea or vomiting, no abdominal pain  Neuro:    No dizziness or light headedness,   Musculoskeletal:  No recent active issues  Extremities:   No edema, good peripheral pulses      Objective:  Physical Exam  BP 130/82    Pulse 68   Ht  (1.702 m)   Wt 204 lb (92.5 kg)   BMI 31.95 kg/m2  .General:   Well developed, well nourished  Lungs:   Clear to auscultation  Heart:    Normal S1 S2, Slight murmur. no rubs, no gallops  Abdomen:   Soft, non tender, no organomegalies, positive bowel sounds  Extremities:   No edema, no cyanosis, good peripheral pulses  Neurological:   Awake, alert, oriented. No obvious focal deficits  Musculoskelatal:  No obvious deformities    Assessment:     1. Essential hypertension     2. Coronary artery disease involving coronary bypass graft of native heart without angina pectoris     3. Familial hypercholesterolemia     cardiac stable as much as can be     Plan:  No Follow-up on file.  Consider a cath id symptoms change   Continue risk factor modification and medical management  Thank you for allowing me to participate in the care of your patient. Please don't hesitate to contact me regarding any further issues related to the patient care    Orders Placed:  No orders of the defined types were placed in this encounter.      Medications Prescribed:  No orders of the defined types were placed in this encounter.         Discussed use, benefit, and side effects of prescribed medications. All patient questions answered. Pt voiced understanding. Instructed to continue current medications, diet and exercise. Continue risk factor modification and medical management. Patient agreed with treatment plan. Follow up as directed.    Electronically signed by [name] Balboa, MD on 06/08/2015 at 11:09 AM

## 2015-06-08 NOTE — Progress Notes (Signed)
Pt here for 6 month fu     Denies chest pain   States he does get some SOB at times  Also gets some dizziness

## 2015-06-10 ENCOUNTER — Encounter

## 2015-06-10 MED ORDER — OXYCODONE-ACETAMINOPHEN 10-325 MG PO TABS
10-325 MG | ORAL_TABLET | Freq: Three times a day (TID) | ORAL | 0 refills | Status: DC | PRN
Start: 2015-06-10 — End: 2015-07-10

## 2015-06-10 NOTE — Telephone Encounter (Signed)
Micheal Birminghamavid A Schulke called requesting a refill on the following medications:  Requested Prescriptions     Pending Prescriptions Disp Refills   ??? oxyCODONE-acetaminophen (PERCOCET) 10-325 MG per tablet 90 tablet 0     Sig: Take 1 tablet by mouth every 8 hours as needed for Pain .     Pharmacy verified:  .pv        Date of last visit: 12/24/14  Date of next visit (if applicable): 07/15/2015    Status of current medication:     Patient is low on medication

## 2015-06-10 NOTE — Telephone Encounter (Signed)
Controlled Substances Monitoring: Attestation: The Prescription Monitoring Report for this patient was reviewed today. (Dot Splinter D Mert Dietrick, DO)  Documentation: No signs of potential drug abuse or diversion identified. (Myan Suit D Cal Gindlesperger, DO)    -Patient's back pain is chronic and is expected to continue longer than 1 week.  Non opioid medications have been tried and have not sufficiently improved her pain.

## 2015-06-11 ENCOUNTER — Encounter

## 2015-06-11 MED ORDER — BUSPIRONE HCL 5 MG PO TABS
5 | ORAL_TABLET | Freq: Two times a day (BID) | ORAL | 5 refills | Status: DC
Start: 2015-06-11 — End: 2015-08-17

## 2015-06-11 NOTE — Telephone Encounter (Signed)
Refill Request - See Attached.

## 2015-06-23 ENCOUNTER — Encounter: Payer: MEDICARE | Attending: Family Medicine | Primary: Family Medicine

## 2015-07-10 ENCOUNTER — Encounter

## 2015-07-10 MED ORDER — OXYCODONE-ACETAMINOPHEN 10-325 MG PO TABS
10-325 | ORAL_TABLET | Freq: Three times a day (TID) | ORAL | 0 refills | Status: DC | PRN
Start: 2015-07-10 — End: 2015-08-07

## 2015-07-10 NOTE — Telephone Encounter (Signed)
Controlled Substances Monitoring: Attestation: The Prescription Monitoring Report for this patient was reviewed today. (Sandeep Radell D Helene Bernstein, DO)  Documentation: No signs of potential drug abuse or diversion identified. (Libia Fazzini D Ameris Akamine, DO)    -Patient's back pain is chronic and has been present for longer than 90 days.  Non opioid medications have been tried and have not sufficiently improved his pain or are contraindicated.

## 2015-07-10 NOTE — Telephone Encounter (Signed)
Micheal Birminghamavid A Aguinaga called requesting a refill on the following medications:  Requested Prescriptions     Pending Prescriptions Disp Refills   ??? oxyCODONE-acetaminophen (PERCOCET) 10-325 MG per tablet 90 tablet 0     Sig: Take 1 tablet by mouth every 8 hours as needed for Pain .     Pharmacy verified:  .pv        Date of last visit: 12/24/14  Date of next visit (if applicable): 07/15/2015    Status of current medication:     Routine Refill

## 2015-07-15 ENCOUNTER — Ambulatory Visit: Admit: 2015-07-15 | Discharge: 2015-07-15 | Payer: MEDICARE | Attending: Family Medicine | Primary: Family Medicine

## 2015-07-15 DIAGNOSIS — E119 Type 2 diabetes mellitus without complications: Secondary | ICD-10-CM

## 2015-07-15 LAB — POCT URINALYSIS DIPSTICK
Blood, UA POC: NEGATIVE
Glucose, UA POC: NEGATIVE
Leukocytes, UA: NEGATIVE
Nitrite, UA: NEGATIVE
Protein, UA POC: 100
Spec Grav, UA: >=1.03
Urobilinogen, UA: 1
pH, UA: 5.5

## 2015-07-15 LAB — POCT MICROALBUMIN
Creatinine Ur POCT: 300
Microalb, Ur: 150

## 2015-07-15 MED ORDER — DOXYCYCLINE HYCLATE 100 MG PO TABS
100 MG | ORAL_TABLET | Freq: Two times a day (BID) | ORAL | 0 refills | Status: DC
Start: 2015-07-15 — End: 2016-09-05

## 2015-07-15 NOTE — Progress Notes (Addendum)
Micheal BirminghamDavid A [name] is a 60 y.o. male that presents for Diabetes (6 month f/u)        HPI:    Diabetes Type 2    Glucose control:   Does patient check blood glucoses at home?  Yes - usually every other days, FBG 130-150  Report of hypoglycemia: no  Lab Results   Component Value Date    LABA1C 5.9 12/24/2014     No results found for: EAG    Symptoms  Polyuria, Polydipsia or Polyphagia?   Yes - polyuria  Chest Pain, SOB, or Palpitations? -  No  New Vision complaints? No  Paresthesias of the extremities?  No    Medications  Current medication were reviewed.  Compliant with medications?  yes  Medication side effects?  No  On ACE-I or ARB?  Yes  On antiplatelet therapy?  Yes  On Statin?  Yes    Exercise  Exercise? No  Wt Readings from Last 3 Encounters:   07/15/15 200 lb 6.4 oz (90.9 kg)   06/08/15 204 lb (92.5 kg)   12/24/14 195 lb (88.5 kg)       Diet discipline?:  Low salt, fat, sugar diet?  no    Blood pressure control:  BP Readings from Last 3 Encounters:   07/15/15 122/62   06/08/15 130/82   12/24/14 120/80       Lab Results   Component Value Date    LABMICR 1.28 09/03/2014       Lab Results   Component Value Date    LDLCALC 53 09/03/2014       Chronic Pain    HPI:    Patient has chronic pain of the low back.  States that the percocet does work well to make him more functional.  Staets taht he is able to sleep with the medication.  Pain control: adequate  Improvement in function and ADLs?  yes  Current Medications:  Percocet 10/325 q 8  Escalation of dosage recently?  no    Evidence for abuse or diversion? yes   Seen specialist or pain clinic?: no  Pain contract: yes    Controlled Substances Monitoring: Attestation: The Prescription Monitoring Report for this patient was reviewed today. [name](Micheal Huezo D Lilburn Straw, DO)  Documentation: No signs of potential drug abuse or diversion identified. [name](Destine Ambroise D Courteny Egler, DO)    Notes rhinitis and facial pressure for the past at least 2 weeks.  Notes intermittent tooth pain.  No fever.    Health  Maintenance   Topic Date Due   ??? Hepatitis C screen  1955-09-19   ??? Diabetic foot exam  06/11/1964   ??? Diabetic retinal exam  06/11/1964   ??? HIV screen  06/11/1969   ??? DTaP/Tdap/Td vaccine (1 - Tdap) 06/11/1973   ??? Zostavax vaccine  06/12/2014   ??? Diabetic hemoglobin A1C test  06/23/2015   ??? Diabetic microalbuminuria test  09/03/2015   ??? Lipid screen  09/03/2015   ??? Colon cancer screen colonoscopy  07/25/2023   ??? Flu vaccine  Completed   ??? Pneumococcal med risk  Completed       Past Medical History:   Diagnosis Date   ??? CAD (coronary artery disease)    ??? Diabetes mellitus (HCC)    ??? Hyperlipidemia    ??? Hypertension        Past Surgical History:   Procedure Laterality Date   ??? CORONARY ARTERY BYPASS GRAFT     ??? ORTHOPEDIC SURGERY Left  Social History   Substance Use Topics   ??? Smoking status: Former Smoker     Types: Cigarettes     Quit date: 05/24/2008   ??? Smokeless tobacco: None   ??? Alcohol use 7.2 oz/week     12 Cans of beer per week      Comment: 12 beers daily       Family History   Problem Relation Age of Onset   ??? Cancer Mother    ??? Heart Disease Brother    ??? Stroke Brother    ??? Diabetes Brother    ??? Early Death Brother    ??? High Blood Pressure Brother    ??? High Cholesterol Brother    ??? Mental Retardation Sister          I have reviewed the patient's past medical history, past surgical history, allergies, medications, social and family history and I have made updates where appropriate.    ROS        PHYSICAL EXAM:  BP 122/62   Pulse 62   Temp 97.9 ??F (36.6 ??C) (Oral)    Resp 12   Ht 5' 6.93" (1.7 m)   Wt 200 lb 6.4 oz (90.9 kg)   BMI 31.45 kg/m2    General Appearance: well developed and well- nourished, in no acute distress  Head: normocephalic and atraumatic  ENT: external ear and ear canal normal bilaterally, nose without deformity, nasal mucosa and turbinates normal without polyps, oropharynx normal, dentition is normal for age, no lip or gum lesions noted  Neck: supple and non-tender without mass, no  thyromegaly or thyroid nodules, no cervical lymphadenopathy  Pulmonary/Chest: clear to auscultation bilaterally- no wheezes, rales or rhonchi, normal air movement, no respiratory distress or retractions  Cardiovascular: normal rate, regular rhythm, normal S1 and S2, no murmurs, rubs, clicks, or gallops, distal pulses intact  Abdomen: soft, non-tender, non-distended, no rebound or guarding, no masses or hernias noted, no hepatospleenomegaly  Extremities: no cyanosis, clubbing or edema of the lower extremities  Psych:  Normal affect without evidence of depression or anxiety, insight and judgement are appropriate, memory appears intact  Skin: warm and dry, no rash or erythema  Foot exam:  No pedal edema, no erythematous lesions noted b/l, sensation wnl b/l, PT and TP pulses wnl b/l        ASSESSMENT & PLAN  Micheal [name] was seen today for diabetes.    Diagnoses and all orders for this visit:    Type 2 diabetes mellitus without complication, without long-term current use of insulin (HCC)  -     Cancel: POCT glycosylated hemoglobin (Hb A1C)  -     POCT microalbumin  -     Hemoglobin A1C; Future  -     Lipid Panel; Future  -     Comprehensive Metabolic Panel; Future    Essential hypertension  -     Hemoglobin A1C; Future  -     Lipid Panel; Future  -     Comprehensive Metabolic Panel; Future    Acute rhinosinusitis  -     doxycycline hyclate (VIBRA-TABS) 100 MG tablet; Take 1 tablet by mouth 2 times daily for 10 days    Chronic bilateral low back pain with bilateral sciatica  -     POCT Urinalysis no Micro    Microalbuminuria due to type 2 diabetes mellitus (HCC)    -Chronic issues well controlled, continue current medications  -Start doxy to cover [employer]  -Advised to call if  any issues    Return in about 6 months (around 01/14/2016), or if symptoms worsen or fail to improve.      Micheal [name] received counseling on the following healthy behaviors: medication adherence  Reviewed prior labs and health maintenance.  Continue current  medications, diet and exercise.  Discussed use, benefit, and side effects of prescribed medications. Barriers to medication compliance addressed.   Patient given educational materials - see patient instructions.    All patient questions answered.  Patient voiced understanding.

## 2015-07-15 NOTE — Addendum Note (Signed)
Addended by: Feliz BeamKAHLE, Shatasia Cutshaw D on: 07/15/2015 03:03 PM     Modules accepted: Orders

## 2015-07-15 NOTE — Progress Notes (Signed)
Visit Information    Have you changed or started any medications since your last visit including any over-the-counter medicines, vitamins, or herbal medicines? yes - Metformin increase to 1000 mg 2 times daily   Are you having any side effects from any of your medications? -  no  Have you stopped taking any of your medications? Is so, why? -  no    Have you seen any other physician or provider since your last visit? Yes - Records Obtained  Have you had any other diagnostic tests since your last visit? No  Have you been seen in the emergency room and/or had an admission to a hospital since we last saw you? No  Have you had your routine dental cleaning in the past 6 months? no    Have you activated your MyChart account? If not, what are your barriers? No: declined     Patient Care Team:  Cecille RubinMark D Kahle, DO as PCP - General (Family Medicine)        Health Maintenance   Topic Date Due   ??? Hepatitis C screen  01-08-1956   ??? Diabetic foot exam  06/11/1964   ??? Diabetic retinal exam  06/11/1964   ??? HIV screen  06/11/1969   ??? DTaP/Tdap/Td vaccine (1 - Tdap) 06/11/1973   ??? Zostavax vaccine  06/12/2014   ??? Diabetic hemoglobin A1C test  06/23/2015   ??? Diabetic microalbuminuria test  09/03/2015   ??? Lipid screen  09/03/2015   ??? Colon cancer screen colonoscopy  07/25/2023   ??? Flu vaccine  Completed   ??? Pneumococcal med risk  Completed

## 2015-07-15 NOTE — Patient Instructions (Signed)
You may receive a survey about your visit with us today. We would greatly appreciate you taking the time to share you perception if you do receive a survey. The feedback from our patients helps us identify what is working well and where the service to all patients can be enhanced. Thank you!

## 2015-07-29 ENCOUNTER — Encounter

## 2015-07-29 MED ORDER — METFORMIN HCL 1000 MG PO TABS
1000 | ORAL_TABLET | Freq: Two times a day (BID) | ORAL | 3 refills | Status: DC
Start: 2015-07-29 — End: 2016-04-11

## 2015-07-29 NOTE — Telephone Encounter (Signed)
[facility] Metformin instead of 30 day supply for cost savings to patient. Please send if appropriate. See attachment.

## 2015-08-07 ENCOUNTER — Encounter

## 2015-08-07 MED ORDER — OXYCODONE-ACETAMINOPHEN 10-325 MG PO TABS
10-325 | ORAL_TABLET | Freq: Three times a day (TID) | ORAL | 0 refills | Status: DC | PRN
Start: 2015-08-07 — End: 2015-09-03

## 2015-08-07 NOTE — Telephone Encounter (Signed)
Controlled Substances Monitoring: Attestation: The Prescription Monitoring Report for this patient was reviewed today. (Jamarius Saha D Jenette Rayson, DO)  Documentation: No signs of potential drug abuse or diversion identified. (Odie Rauen D Dinorah Masullo, DO)

## 2015-08-07 NOTE — Telephone Encounter (Signed)
Micheal [name] called requesting a refill on the following medications:  Requested Prescriptions     Pending Prescriptions Disp Refills   ??? oxyCODONE-acetaminophen (PERCOCET) 10-325 MG per tablet 90 tablet 0     Sig: Take 1 tablet by mouth every 8 hours as needed for Pain .     Pharmacy verified:  .pv    Pt has enough medication through Sunday.    Date of last visit:  07/15/15  Date of next visit (if applicable): 01/13/16    Status of current medication:     Patient is low on medication

## 2015-08-12 NOTE — Telephone Encounter (Signed)
When calling the patient to find out who did his colonoscopy and where it was done, he stated that it was done in [place].  He could not remember the doctor's name or the facility name.  He is separated from his wife so he cannot ask her either.

## 2015-08-17 ENCOUNTER — Ambulatory Visit: Admit: 2015-08-17 | Discharge: 2015-08-17 | Payer: MEDICARE | Attending: Family Medicine | Primary: Family Medicine

## 2015-08-17 ENCOUNTER — Encounter

## 2015-08-17 DIAGNOSIS — F411 Generalized anxiety disorder: Secondary | ICD-10-CM

## 2015-08-17 MED ORDER — LORAZEPAM 0.5 MG PO TABS
0.5 MG | ORAL_TABLET | Freq: Two times a day (BID) | ORAL | 1 refills | Status: DC | PRN
Start: 2015-08-17 — End: 2015-10-12

## 2015-08-17 MED ORDER — CITALOPRAM HYDROBROMIDE 20 MG PO TABS
20 MG | ORAL_TABLET | Freq: Every day | ORAL | 5 refills | Status: DC
Start: 2015-08-17 — End: 2015-08-17

## 2015-08-17 MED ORDER — CITALOPRAM HYDROBROMIDE 20 MG PO TABS
20 | ORAL_TABLET | Freq: Every day | ORAL | 3 refills | Status: DC
Start: 2015-08-17 — End: 2015-09-16

## 2015-08-17 NOTE — Progress Notes (Signed)
Visit Information    Have you changed or started any medications since your last visit including any over-the-counter medicines, vitamins, or herbal medicines? no   Are you having any side effects from any of your medications? -  no  Have you stopped taking any of your medications? Is so, why? -  no    Have you seen any other physician or provider since your last visit? No  Have you had any other diagnostic tests since your last visit? No  Have you been seen in the emergency room and/or had an admission to a hospital since we last saw you? No  Have you had your routine dental cleaning in the past 6 months? yes - routine     Have you activated your MyChart account? If not, what are your barriers? No: pt declined      Patient Care Team:  [name], DO as PCP - General (Family Medicine)    Medical History Review  Past Medical, Family, and Social History reviewed and does contribute to the patient presenting condition    Health Maintenance   Topic Date Due   ??? Hepatitis C screen  [birth year]   ??? Diabetic retinal exam  06/11/1964   ??? HIV screen  06/11/1969   ??? DTaP/Tdap/Td vaccine (1 - Tdap) 06/11/1973   ??? Zostavax vaccine  06/12/2014   ??? Flu vaccine (1) 08/25/2015   ??? Lipid screen  09/03/2015   ??? Diabetic hemoglobin A1C test  12/24/2015   ??? Diabetic foot exam  07/14/2016   ??? Diabetic microalbuminuria test  07/14/2016   ??? Colon cancer screen colonoscopy  07/25/2023   ??? Pneumococcal med risk  Completed

## 2015-08-17 NOTE — Progress Notes (Signed)
Micheal [name] is a 60 y.o. male that presents for Anxiety (pt states that he was prescribed buspar for anxiety but it is not effective for his sxs, he is still having a lot of anxiety attacks where he gets shaky and jittery, pt also states that he is having trouble sleeping at night, trouble falling asleep and staying asleep )      Anxiety     HPI:  Has been noting more anxiety recently.  Doe snot feel like Buspar is working anymore.  He is getting more panic attacks recently.    Inciting events or triggers for anxiety - can be anything   Frequency of anxiety - daily  Panic attacks?  Yes - occurring on a daily basis  Symptoms of panic attacks -  Overwhelming anxiety and sense of doom  Sleep Disturbances?  Yes - notes difficulty falling and staying asleep  Impaired concentration? Yes  Substance abuse? No  Suicidal/Homicidal Ideation? No    Compliant with meds: yes  Med side effects: No    Sees therapist?: No  Family History of Mental Illness? Yes    Physical Exam    BP 116/78 (Site: Right Arm, Position: Sitting)   Pulse 76   Temp 99 ??F (37.2 ??C) (Oral)    Resp 12   Ht 5' 6.93" (1.7 m)   Wt 201 lb 9.6 oz (91.4 kg)   BMI 31.64 kg/m2  Appearance: alert, well appearing, and in no distress, normal appearing weight and well hydrated.  Psych:  Affect appropriate.  Thought process is normal without evidence of depression or psychosis.    Good insight and appropriae interaction.  Cognition and memory appear to be intact.    ASSESSMENT & PLAN  [name] was seen today for anxiety.    Diagnoses and all orders for this visit:    GAD (generalized anxiety disorder)  -     citalopram (CELEXA) 20 MG tablet; Take 1 tablet by mouth daily  -     LORazepam (ATIVAN) 0.5 MG tablet; Take 1 tablet by mouth every 12 hours as needed for Anxiety    Panic attack  -     citalopram (CELEXA) 20 MG tablet; Take 1 tablet by mouth daily  -     LORazepam (ATIVAN) 0.5 MG tablet; Take 1 tablet by mouth every 12 hours as needed for Anxiety    -Will start  celexa for GAD.  Will also start ativan BID PRN in the short term for panic disorder.      Discussed side effects and benefits of Celexa.  I advised him that if he develops any SI/HI or concerning symptoms after starting the medication he needs to stop the medication and seek treatment immediately.  Will call in 1 week to see how he is doing.      Return in about 3 months (around 11/17/2015), or if symptoms worsen or fail to improve.    Justen received counseling on the following healthy behaviors: medication adherence  Reviewed prior labs and health maintenance.  Continue current medications, diet and exercise.  Discussed use, benefit, and side effects of prescribed medications. Barriers to medication compliance addressed.   Patient given educational materials - see patient instructions.    All patient questions answered.  Patient voiced understanding.

## 2015-08-24 MED ORDER — FLUTICASONE PROPIONATE 50 MCG/ACT NA SUSP
50 | Freq: Every day | NASAL | 5 refills | Status: DC
Start: 2015-08-24 — End: 2016-07-19

## 2015-08-24 MED ORDER — FLUTICASONE PROPIONATE 50 MCG/ACT NA SUSP
50 | Freq: Every day | NASAL | 5 refills | Status: DC
Start: 2015-08-24 — End: 2015-08-24

## 2015-08-24 MED ORDER — OLOPATADINE HCL 0.1 % OP SOLN
0.1 | Freq: Two times a day (BID) | OPHTHALMIC | 5 refills | Status: DC
Start: 2015-08-24 — End: 2017-05-15

## 2015-08-24 NOTE — Telephone Encounter (Signed)
08/24/15   Micheal [name] called requesting a refill on the following medications:  Requested Prescriptions     Pending Prescriptions Disp Refills   ??? olopatadine (PATANOL) 0.1 % ophthalmic solution 1 Bottle 5     Sig: Place 1 drop into both eyes 2 times daily   ??? fluticasone (FLONASE) 50 MCG/ACT nasal spray 1 Bottle 5     Sig: 2 sprays by Nasal route daily     Pharmacy verified:  .pv        Date of last visit:  08/17/15  Date of next visit (if applicable): 11/23/15  blm    Status of current medication:     Routine Refill

## 2015-08-24 NOTE — Telephone Encounter (Signed)
Refill request. See attached.

## 2015-09-01 ENCOUNTER — Encounter

## 2015-09-01 MED ORDER — ESOMEPRAZOLE MAGNESIUM 40 MG PO CPDR
40 | ORAL_CAPSULE | ORAL | 5 refills | Status: DC
Start: 2015-09-01 — End: 2015-09-01

## 2015-09-01 MED ORDER — ESOMEPRAZOLE MAGNESIUM 40 MG PO CPDR
40 | ORAL_CAPSULE | ORAL | 5 refills | Status: DC
Start: 2015-09-01 — End: 2016-04-11

## 2015-09-01 NOTE — Telephone Encounter (Signed)
Refill Request - See Attached.

## 2015-09-01 NOTE — Telephone Encounter (Signed)
[facility] Rx request scanned into media

## 2015-09-03 ENCOUNTER — Encounter

## 2015-09-03 MED ORDER — OXYCODONE-ACETAMINOPHEN 10-325 MG PO TABS
10-325 | ORAL_TABLET | Freq: Three times a day (TID) | ORAL | 0 refills | Status: DC | PRN
Start: 2015-09-03 — End: 2015-10-05

## 2015-09-03 NOTE — Telephone Encounter (Signed)
Controlled Substances Monitoring: Attestation: The Prescription Monitoring Report for this patient was reviewed today. (Amaka Gluth D Taunia Frasco, DO)  Documentation: No signs of potential drug abuse or diversion identified. (Tasha Diaz D Steffany Schoenfelder, DO)

## 2015-09-03 NOTE — Telephone Encounter (Signed)
Vivia Birminghamavid A Szwed called requesting a refill on the following medications:  Requested Prescriptions     Pending Prescriptions Disp Refills   ??? oxyCODONE-acetaminophen (PERCOCET) 10-325 MG per tablet 90 tablet 0     Sig: Take 1 tablet by mouth every 8 hours as needed for Pain .     Pharmacy verified:  .pv        Date of last visit: 08/17/15  Date of next visit (if applicable): 11/23/2015    Status of current medication:     Routine Refill

## 2015-09-16 ENCOUNTER — Telehealth

## 2015-09-16 MED ORDER — CITALOPRAM HYDROBROMIDE 40 MG PO TABS
40 | ORAL_TABLET | Freq: Every day | ORAL | 1 refills | Status: DC
Start: 2015-09-16 — End: 2016-03-03

## 2015-09-16 NOTE — Telephone Encounter (Signed)
OK, is he having suicidal thoughts currently?

## 2015-09-16 NOTE — Telephone Encounter (Signed)
I'd like to increase the dose of his celexa to 40mg  po daily.  I will send a new rx to the pharmacy.

## 2015-09-16 NOTE — Telephone Encounter (Signed)
-----   Message from Cecille RubinMark D Kahle, DO sent at 09/16/2015  7:17 AM EDT -----  Please call patient and see if his anxiety is better controlled.    Mark    ----- Message -----     From: Cecille RubinMark D Kahle, DO     Sent: 09/16/2015       To: Cecille RubinMark D Kahle, DO    Call RE anxiety and Celexa

## 2015-09-16 NOTE — Telephone Encounter (Signed)
Pt. Micheal [name] he is the same, good days & bad days.

## 2015-09-16 NOTE — Telephone Encounter (Signed)
Pt. Informed.  He is very concerned about the increased dose & the side effect of suicidal thoughts.  I suggested he [facility] and let us know if he does nto feel comfortable taking the higher dose.

## 2015-09-16 NOTE — Telephone Encounter (Signed)
No, not currently.

## 2015-09-16 NOTE — Telephone Encounter (Signed)
OK, thank you.

## 2015-09-21 ENCOUNTER — Encounter

## 2015-09-21 MED ORDER — METOPROLOL SUCCINATE ER 100 MG PO TB24
100 | ORAL_TABLET | ORAL | 5 refills | Status: DC
Start: 2015-09-21 — End: 2016-04-11

## 2015-09-21 MED ORDER — METOPROLOL SUCCINATE ER 100 MG PO TB24
100 | ORAL_TABLET | ORAL | 5 refills | Status: DC
Start: 2015-09-21 — End: 2015-09-21

## 2015-09-21 MED ORDER — CLOPIDOGREL BISULFATE 75 MG PO TABS
75 | ORAL_TABLET | ORAL | 5 refills | Status: DC
Start: 2015-09-21 — End: 2016-02-18

## 2015-09-21 MED ORDER — LISINOPRIL 40 MG PO TABS
40 | ORAL_TABLET | ORAL | 5 refills | Status: DC
Start: 2015-09-21 — End: 2016-02-17

## 2015-09-21 MED ORDER — ROSUVASTATIN CALCIUM 40 MG PO TABS
40 | ORAL_TABLET | ORAL | 5 refills | Status: DC
Start: 2015-09-21 — End: 2016-02-17

## 2015-09-21 MED ORDER — FENOFIBRATE 145 MG PO TABS
145 | ORAL_TABLET | ORAL | 5 refills | Status: DC
Start: 2015-09-21 — End: 2016-02-17

## 2015-09-21 NOTE — Telephone Encounter (Signed)
Requesting a 90 day supply

## 2015-10-05 ENCOUNTER — Encounter

## 2015-10-05 MED ORDER — OXYCODONE-ACETAMINOPHEN 10-325 MG PO TABS
10-325 | ORAL_TABLET | Freq: Three times a day (TID) | ORAL | 0 refills | Status: DC | PRN
Start: 2015-10-05 — End: 2015-11-03

## 2015-10-05 NOTE — Telephone Encounter (Addendum)
Micheal [name] called requesting a refill on the following medications:  Requested Prescriptions     Pending Prescriptions Disp Refills   ??? oxyCODONE-acetaminophen (PERCOCET) 10-325 MG per tablet 90 tablet 0     Sig: Take 1 tablet by mouth every 8 hours as needed for Pain .     Pharmacy verified: [facility]      Date of last visit: 08/17/15  Date of next visit (if applicable): 11/23/2015      Date of last fill and quantity (to be completed by clinical staff)  Pharmacy name:

## 2015-10-05 NOTE — Telephone Encounter (Signed)
Controlled Substances Monitoring: Attestation: The Prescription Monitoring Report for this patient was reviewed today. (Wright Gravely D Abdulaziz Toman, DO)  Documentation: No signs of potential drug abuse or diversion identified. (Jakyle Petrucelli D Cloyde Oregel, DO)

## 2015-10-12 ENCOUNTER — Telehealth

## 2015-10-12 MED ORDER — LORAZEPAM 0.5 MG PO TABS
0.5 | ORAL_TABLET | Freq: Two times a day (BID) | ORAL | 1 refills | Status: DC | PRN
Start: 2015-10-12 — End: 2015-11-27

## 2015-10-12 NOTE — Telephone Encounter (Signed)
Sent!

## 2015-10-12 NOTE — Telephone Encounter (Signed)
[facility] sent a request for LORAZEPAM 0.5 mg tablets. Please see attached

## 2015-11-03 ENCOUNTER — Encounter

## 2015-11-03 MED ORDER — OXYCODONE-ACETAMINOPHEN 10-325 MG PO TABS
10-325 | ORAL_TABLET | Freq: Three times a day (TID) | ORAL | 0 refills | Status: DC | PRN
Start: 2015-11-03 — End: 2015-12-02

## 2015-11-03 NOTE — Telephone Encounter (Signed)
Controlled Substances Monitoring:     Attestation: The Prescription Monitoring Report for this patient was reviewed today. (Natalea Sutliff D Aarianna Hoadley, DO)  Documentation: No signs of potential drug abuse or diversion identified. (Joban Colledge D Genna Casimir, DO)

## 2015-11-03 NOTE — Telephone Encounter (Signed)
11/03/2015   Micheal [name] called requesting a refill on the following medications:  Requested Prescriptions     Pending Prescriptions Disp Refills   ??? oxyCODONE-acetaminophen (PERCOCET) 10-325 MG per tablet 90 tablet 0     Sig: Take 1 tablet by mouth every 8 hours as needed for Pain .     Pharmacy verified:  [address]      Date of last visit: [id number]  Date of next visit (if applicable): 11/23/2015        Date of last fill and quantity (to be completed by clinical staff)  Pharmacy name:

## 2015-11-23 ENCOUNTER — Encounter: Payer: MEDICARE | Attending: Family Medicine | Primary: Family Medicine

## 2015-11-27 ENCOUNTER — Encounter

## 2015-11-27 MED ORDER — LORAZEPAM 0.5 MG PO TABS
0.5 | ORAL_TABLET | Freq: Two times a day (BID) | ORAL | 1 refills | Status: DC | PRN
Start: 2015-11-27 — End: 2016-03-14

## 2015-11-27 NOTE — Telephone Encounter (Signed)
Micheal [name] called requesting a refill on the following medications:  Requested Prescriptions     Pending Prescriptions Disp Refills   ??? LORazepam (ATIVAN) 0.5 MG tablet 60 tablet 1     Sig: Take 1 tablet by mouth every 12 hours as needed for Anxiety     Pharmacy verified:  [facility]      Date of last visit: 08/17/15  Date of next visit (if applicable): 01/13/2016        Date of last fill and quantity (to be completed by clinical staff)  Pharmacy name: [facility] [address]

## 2015-11-27 NOTE — Telephone Encounter (Signed)
Controlled Substances Monitoring:     Attestation: The Prescription Monitoring Report for this patient was reviewed today. (Sham Alviar D Rikki Trosper, DO)  Documentation: No signs of potential drug abuse or diversion identified. (Amamda Curbow D Basheer Molchan, DO)

## 2015-12-02 ENCOUNTER — Encounter

## 2015-12-02 MED ORDER — OXYCODONE-ACETAMINOPHEN 10-325 MG PO TABS
10-325 | ORAL_TABLET | Freq: Three times a day (TID) | ORAL | 0 refills | Status: DC | PRN
Start: 2015-12-02 — End: 2015-12-31

## 2015-12-02 NOTE — Telephone Encounter (Signed)
Micheal Birminghamavid A Fuchs called requesting a refill on the following medications:  Requested Prescriptions     Pending Prescriptions Disp Refills   ??? oxyCODONE-acetaminophen (PERCOCET) 10-325 MG per tablet 90 tablet 0     Sig: Take 1 tablet by mouth every 8 hours as needed for Pain .     Pharmacy verified:  [facility]      Date of last visit: 08/17/15  Date of next visit (if applicable): 01/13/2016        Date of last fill and quantity (to be completed by clinical staff)  [facility]

## 2015-12-02 NOTE — Telephone Encounter (Signed)
Controlled Substances Monitoring:     Attestation: The Prescription Monitoring Report for this patient was reviewed today. (Dmiya Malphrus D Brianda Beitler, DO)  Documentation: No signs of potential drug abuse or diversion identified. (Margareta Laureano D Damarcus Reggio, DO)

## 2015-12-08 NOTE — Telephone Encounter (Signed)
Was discontinued

## 2015-12-08 NOTE — Telephone Encounter (Signed)
Recd [facility]  Not on current med list.  (see attached correspondence)  Please advise.

## 2015-12-08 NOTE — Telephone Encounter (Signed)
Pharmacy informed.

## 2015-12-31 ENCOUNTER — Encounter

## 2015-12-31 MED ORDER — OXYCODONE-ACETAMINOPHEN 10-325 MG PO TABS
10-325 | ORAL_TABLET | Freq: Three times a day (TID) | ORAL | 0 refills | Status: DC | PRN
Start: 2015-12-31 — End: 2016-01-29

## 2015-12-31 NOTE — Telephone Encounter (Signed)
Controlled Substances Monitoring:     Attestation: The Prescription Monitoring Report for this patient was reviewed today. (Abbygale Lapid D Verley Pariseau, DO)  Documentation: No signs of potential drug abuse or diversion identified. (Ohm Dentler D Rafaelita Foister, DO)

## 2015-12-31 NOTE — Telephone Encounter (Signed)
Vivia Birminghamavid A Kirchoff called requesting a refill on the following medications:  Requested Prescriptions     Pending Prescriptions Disp Refills   ??? oxyCODONE-acetaminophen (PERCOCET) 10-325 MG per tablet 90 tablet 0     Sig: Take 1 tablet by mouth every 8 hours as needed for Pain .     Pharmacy verified:  [facility]      Date of last visit: 08/17/15  Date of next visit (if applicable): 01/13/2016

## 2016-01-13 ENCOUNTER — Ambulatory Visit: Admit: 2016-01-13 | Discharge: 2016-01-13 | Payer: MEDICARE | Attending: Family Medicine | Primary: Family Medicine

## 2016-01-13 DIAGNOSIS — E119 Type 2 diabetes mellitus without complications: Secondary | ICD-10-CM

## 2016-01-13 LAB — POCT GLYCOSYLATED HEMOGLOBIN (HGB A1C): Hemoglobin A1C: 8.5 %

## 2016-01-13 MED ORDER — BUSPIRONE HCL 7.5 MG PO TABS
7.5 | ORAL_TABLET | Freq: Two times a day (BID) | ORAL | 2 refills | Status: DC
Start: 2016-01-13 — End: 2016-02-16

## 2016-01-13 MED ORDER — HYDROCORTISONE 2.5 % EX CREA
2.5 % | CUTANEOUS | 2 refills | Status: DC
Start: 2016-01-13 — End: 2018-04-25

## 2016-01-13 NOTE — Progress Notes (Signed)
After obtaining consent, and per orders of Dr. Ulyess Mortkahle, injection of Fluzone 0.5 mL IM given in Right deltoid by Ayaan Ringle. Patient instructed to remain in clinic for 20 minutes afterwards, and to report any adverse reaction to me immediately.

## 2016-01-13 NOTE — Patient Instructions (Signed)
You may receive a survey about your visit with us today. The feedback from our patients helps us identify what is working well and where the service to all patients can be enhanced. Thank you!

## 2016-01-13 NOTE — Progress Notes (Signed)
Micheal [name] is a 60 y.o. male that presents for 6 Month Follow-Up; Nasal Congestion (sinus and chest congestion, bilateral ear itching for month ); Flu Vaccine; and Other (due for DM eye exam)        HPI:    Still having a lot of anxiety.  Still on Celexa and ativan.  Feels like buspar worked well for him in the past.  Still having some difficulty sleeping.  Takes ativan once or twice per day.    Diabetes Type 2    Glucose control:   Does patient check blood glucoses at home?  Yes - has been running higher recently  Report of hypoglycemia: no  Lab Results   Component Value Date    LABA1C 8.5 01/13/2016     No results found for: EAG    Symptoms  Polyuria, Polydipsia or Polyphagia?   Yes - polyuria  Chest Pain, SOB, or Palpitations? -  No  New Vision complaints? No  Paresthesias of the extremities?  No    Medications  Current medication were reviewed.  Compliant with medications?  yes  Medication side effects?  No  On ACE-I or ARB?  Yes  On antiplatelet therapy?  Yes  On Statin?  Yes    Exercise  Exercise? No  Wt Readings from Last 3 Encounters:   01/13/16 218 lb (98.9 kg)   08/17/15 201 lb 9.6 oz (91.4 kg)   07/15/15 200 lb 6.4 oz (90.9 kg)       Diet discipline?:  Low salt, fat, sugar diet?  No, states that he is eating a lot of donuts.    Blood pressure control:  BP Readings from Last 3 Encounters:   01/13/16 126/80   08/17/15 116/78   07/15/15 122/62       Lab Results   Component Value Date    LABMICR 1.28 09/03/2014       Lab Results   Component Value Date    LDLCALC 53 09/03/2014       Chronic Pain    HPI:    Patient has chronic pain of the low back.  States that the percocet does work well to make him more functional.  Staets that he is able to sleep with the medication.  States that he is interested looking into possible surgical options.  Pain control: adequate  Improvement in function and ADLs?  yes  Current Medications:  Percocet 10/325 q 8  Escalation of dosage recently?  no    Evidence for abuse or  diversion? yes   Seen specialist or pain clinic?: no  Pain contract: yes    Controlled Substances Monitoring: Attestation: The Prescription Monitoring Report for this patient was reviewed today. [name], DO)  Documentation: No signs of potential drug abuse or diversion identified. [name], DO)    HTN    Does patient check BP regularly at home? - No  Current Medication regimen - metoprolol  Tolerating medications well? - yes    Shortness of breath or chest pain? No  Headache or visual complaints? No  Neurologic changes like confusion? No  Extremity edema? No    BP Readings from Last 3 Encounters:   01/13/16 126/80   08/17/15 116/78   07/15/15 122/62         Health Maintenance   Topic Date Due   ??? Hepatitis C screen  March 24, 1955   ??? Diabetic retinal exam  06/11/1964   ??? HIV screen  06/11/1969   ??? DTaP/Tdap/Td  vaccine (1 - Tdap) 06/11/1973   ??? Zostavax vaccine  06/12/2014   ??? Lipid screen  09/03/2015   ??? Flu vaccine (1) 09/25/2015   ??? Diabetic foot exam  07/14/2016   ??? Diabetic microalbuminuria test  07/14/2016   ??? Diabetic hemoglobin A1C test  01/12/2017   ??? Colon cancer screen colonoscopy  07/25/2023   ??? Pneumococcal med risk  Completed       Past Medical History:   Diagnosis Date   ??? CAD (coronary artery disease)    ??? Diabetes mellitus (HCC)    ??? Hyperlipidemia    ??? Hypertension        Past Surgical History:   Procedure Laterality Date   ??? CORONARY ARTERY BYPASS GRAFT     ??? ORTHOPEDIC SURGERY Left        Social History   Substance Use Topics   ??? Smoking status: Former Smoker     Types: Cigarettes     Quit date: 05/24/2008   ??? Smokeless tobacco: Never Used   ??? Alcohol use 7.2 oz/week     12 Cans of beer per week      Comment: 12 beers daily       Family History   Problem Relation Age of Onset   ??? Cancer Mother    ??? Heart Disease Brother    ??? Stroke Brother    ??? Diabetes Brother    ??? Early Death Brother    ??? High Blood Pressure Brother    ??? High Cholesterol Brother    ??? Mental Retardation Sister          I  have reviewed the patient's past medical history, past surgical history, allergies, medications, social and family history and I have made updates where appropriate.    ROS        PHYSICAL EXAM:  BP 126/80    Pulse 66    Temp 98 ??F (36.7 ??C) (Oral)    Resp 16    Ht 5\' 7"  (1.702 m)    Wt 218 lb (98.9 kg)    BMI 34.14 kg/m??     General Appearance: well developed and well- nourished, in no acute distress  Head: normocephalic and atraumatic  ENT: external ear and ear canal normal bilaterally, nose without deformity, nasal mucosa and turbinates normal without polyps, oropharynx normal, dentition is normal for age, no lip or gum lesions noted  Neck: supple and non-tender without mass, no thyromegaly or thyroid nodules, no cervical lymphadenopathy  Pulmonary/Chest: clear to auscultation bilaterally- no wheezes, rales or rhonchi, normal air movement, no respiratory distress or retractions  Cardiovascular: normal rate, regular rhythm, normal S1 and S2, no murmurs, rubs, clicks, or gallops, distal pulses intact  Abdomen: soft, non-tender, non-distended, no rebound or guarding, no masses or hernias noted, no hepatospleenomegaly  Extremities: no cyanosis, clubbing or edema of the lower extremities  Psych:  Normal affect without evidence of depression or anxiety, insight and judgement are appropriate, memory appears intact  Skin: warm and dry, no rash or erythema        ASSESSMENT & PLAN  Micheal [name] was seen today for 6 month follow-up, nasal congestion, flu vaccine and other.    Diagnoses and all orders for this visit:    Type 2 diabetes mellitus without complication, without long-term current use of insulin (HCC)  -     POCT glycosylated hemoglobin (Hb A1C)    Essential hypertension    Coronary artery disease of native artery of native heart  with stable angina pectoris (HCC)    GAD (generalized anxiety disorder)  -     busPIRone (BUSPAR) 7.5 MG tablet; Take 1 tablet by mouth 2 times daily    Spinal stenosis of lumbar region without  neurogenic claudication  -     External Referral To Orthopedic Surgery    Dermatitis  -     hydrocortisone 2.5 % cream; Apply to external ear BID PRN    Need for immunization against influenza  -     INFLUENZA, QUADV, 3 YRS AND OLDER, IM, PF, PREFILL SYR OR SDV, 0.5ML (FLUZONE QUADV, PF)       -Will add back buspar as this worked for him in the past  -referral to Ortho placed to evaluate for surgical options for his low back pain  -A1c elevated at 8.5 which is quite high for him.  In speaking with him, he is way off of his baseline as far as his diet.  He reports eating a lot more donuts.  He wants to try lifestyle management prior to starting a new medication which I feel is reasonable.  Will recheck a1c in 3 months.  -Chronic issues well controlled, continue current medications  -Advised to call if any issues    Return in about 3 months (around 04/12/2016), or if symptoms worsen or fail to improve.      Micheal [name] received counseling on the following healthy behaviors: medication adherence  Reviewed prior labs and health maintenance.  Continue current medications, diet and exercise.  Discussed use, benefit, and side effects of prescribed medications. Barriers to medication compliance addressed.   Patient given educational materials - see patient instructions.    All patient questions answered.  Patient voiced understanding.

## 2016-01-13 NOTE — Progress Notes (Signed)
Visit Information    Have you changed or started any medications since your last visit including any over-the-counter medicines, vitamins, or herbal medicines? no   Are you having any side effects from any of your medications? -  no  Have you stopped taking any of your medications? Is so, why? -  no    Have you seen any other physician or provider since your last visit? No  Have you had any other diagnostic tests since your last visit? No  Have you been seen in the emergency room and/or had an admission to a hospital since we last saw you? No  Have you had your routine dental cleaning in the past 6 months? no    Have you activated your MyChart account? If not, what are your barriers? No     Patient Care Team:  Mark D Kahle, DO as PCP - General (Family Medicine)    Medical History Review  Past Medical, Family, and Social History     Defer to provider.

## 2016-01-29 ENCOUNTER — Encounter

## 2016-01-29 MED ORDER — OXYCODONE-ACETAMINOPHEN 10-325 MG PO TABS
10-325 | ORAL_TABLET | Freq: Three times a day (TID) | ORAL | 0 refills | Status: DC | PRN
Start: 2016-01-29 — End: 2016-02-26

## 2016-01-29 NOTE — Telephone Encounter (Signed)
Controlled Substances Monitoring:     Attestation: The Prescription Monitoring Report for this patient was reviewed today. (Almira Phetteplace D Leyah Bocchino, DO)  Documentation: No signs of potential drug abuse or diversion identified. (Amori Cooperman D [name] Montefusco, DO)

## 2016-01-29 NOTE — Telephone Encounter (Signed)
Micheal [name] called requesting a refill on the following medications:  Requested Prescriptions     Pending Prescriptions Disp Refills   ??? oxyCODONE-acetaminophen (PERCOCET) 10-325 MG per tablet 90 tablet 0     Sig: Take 1 tablet by mouth every 8 hours as needed for Pain for up to 30 days.     [facility]      Date of last visit: 01/13/16  Date of next visit (if applicable): 04/12/2016

## 2016-02-03 ENCOUNTER — Encounter

## 2016-02-16 ENCOUNTER — Encounter

## 2016-02-16 MED ORDER — BUSPIRONE HCL 7.5 MG PO TABS
7.5 | ORAL_TABLET | Freq: Two times a day (BID) | ORAL | 2 refills | Status: DC
Start: 2016-02-16 — End: 2016-05-11

## 2016-02-16 NOTE — Telephone Encounter (Signed)
Patient is calling in regarding his buspirone medication that was just refilled in December 2017. He needs that switched to [facility] [address] now, instead of [facility]. Please verify and send.

## 2016-02-17 ENCOUNTER — Encounter

## 2016-02-17 NOTE — Telephone Encounter (Signed)
See attached

## 2016-02-18 ENCOUNTER — Encounter

## 2016-02-18 MED ORDER — LISINOPRIL 40 MG PO TABS
40 | ORAL_TABLET | ORAL | 5 refills | Status: DC
Start: 2016-02-18 — End: 2016-04-11

## 2016-02-18 MED ORDER — ROSUVASTATIN CALCIUM 40 MG PO TABS
40 | ORAL_TABLET | ORAL | 5 refills | Status: DC
Start: 2016-02-18 — End: 2016-04-11

## 2016-02-18 MED ORDER — CLOPIDOGREL BISULFATE 75 MG PO TABS
75 | ORAL_TABLET | ORAL | 5 refills | Status: DC
Start: 2016-02-18 — End: 2016-04-11

## 2016-02-18 MED ORDER — FENOFIBRATE 145 MG PO TABS
145 | ORAL_TABLET | ORAL | 5 refills | Status: DC
Start: 2016-02-18 — End: 2016-04-11

## 2016-02-18 NOTE — Telephone Encounter (Signed)
[name] called requesting a refill on the following medications:  Requested Prescriptions     Pending Prescriptions Disp Refills   ??? clopidogrel (PLAVIX) 75 MG tablet 30 tablet 5     Sig: TAKE 1 TABLET BY MOUTH DAILY     [facility]      Date of last visit: 01/13/2016  Date of next visit (if applicable): 04/12/2016

## 2016-02-26 ENCOUNTER — Encounter

## 2016-02-26 MED ORDER — OXYCODONE-ACETAMINOPHEN 10-325 MG PO TABS
10-325 | ORAL_TABLET | Freq: Three times a day (TID) | ORAL | 0 refills | Status: DC | PRN
Start: 2016-02-26 — End: 2016-03-23

## 2016-02-26 NOTE — Telephone Encounter (Signed)
Controlled Substances Monitoring:     Attestation: The Prescription Monitoring Report for this patient was reviewed today. (Enrika Aguado D Alazay Leicht, DO)  Documentation: No signs of potential drug abuse or diversion identified. (Dalaina Tates D Blanca Thornton, DO)

## 2016-02-26 NOTE — Telephone Encounter (Signed)
Micheal [name] called requesting a refill on the following medications:  Requested Prescriptions     Pending Prescriptions Disp Refills   ??? oxyCODONE-acetaminophen (PERCOCET) 10-325 MG per tablet 90 tablet 0     Sig: Take 1 tablet by mouth every 8 hours as needed for Pain for up to 30 days.     [facility]      Date of last visit: 01/13/16  Date of next visit (if applicable): 04/12/2016

## 2016-03-03 ENCOUNTER — Encounter

## 2016-03-03 MED ORDER — CITALOPRAM HYDROBROMIDE 40 MG PO TABS
40 | ORAL_TABLET | Freq: Every day | ORAL | 1 refills | Status: DC
Start: 2016-03-03 — End: 2016-05-06

## 2016-03-14 ENCOUNTER — Telehealth

## 2016-03-14 MED ORDER — LORAZEPAM 0.5 MG PO TABS
0.5 | ORAL_TABLET | Freq: Two times a day (BID) | ORAL | 1 refills | Status: AC | PRN
Start: 2016-03-14 — End: 2016-04-13

## 2016-03-14 NOTE — Telephone Encounter (Signed)
03/14/16  Patient requesting refill on LORazepam (ATIVAN) 0.5 MG bid (not on med list).   [facility] [address]   Thanks/blm  Dolv: 01/13/16  Donv: 04/12/16

## 2016-03-14 NOTE — Telephone Encounter (Signed)
Controlled Substances Monitoring:     The Prescription Monitoring Report for this patient was reviewed today. (Lasalle Abee D Hanad Leino, DO)    No signs of potential drug abuse or diversion identified. (Batya Citron D Dejanira Pamintuan, DO)

## 2016-03-23 ENCOUNTER — Encounter

## 2016-03-23 MED ORDER — OXYCODONE-ACETAMINOPHEN 10-325 MG PO TABS
10-325 | ORAL_TABLET | Freq: Three times a day (TID) | ORAL | 0 refills | Status: DC | PRN
Start: 2016-03-23 — End: 2016-04-20

## 2016-03-23 NOTE — Telephone Encounter (Signed)
Micheal [name] called requesting a refill on the following medications:  Requested Prescriptions     Pending Prescriptions Disp Refills   ??? oxyCODONE-acetaminophen (PERCOCET) 10-325 MG per tablet 90 tablet 0     Sig: Take 1 tablet by mouth every 8 hours as needed for Pain for up to 30 days.     Pharmacy verified: [facility] [address]  [address]      Date of last visit: 01/13/16  Date of next visit (if applicable): 04/12/2016

## 2016-03-23 NOTE — Telephone Encounter (Signed)
Controlled Substances Monitoring:     The Prescription Monitoring Report for this patient was reviewed today. (Zebulan Hinshaw D Hailley Byers, DO)    No signs of potential drug abuse or diversion identified. (Nargis Abrams D Scorpio Fortin, DO)

## 2016-04-11 ENCOUNTER — Encounter

## 2016-04-11 MED ORDER — METOPROLOL SUCCINATE ER 100 MG PO TB24
100 | ORAL_TABLET | ORAL | 5 refills | Status: DC
Start: 2016-04-11 — End: 2016-05-07

## 2016-04-11 MED ORDER — FENOFIBRATE 145 MG PO TABS
145 | ORAL_TABLET | ORAL | 5 refills | Status: DC
Start: 2016-04-11 — End: 2016-09-27

## 2016-04-11 MED ORDER — ROSUVASTATIN CALCIUM 40 MG PO TABS
40 | ORAL_TABLET | ORAL | 5 refills | Status: DC
Start: 2016-04-11 — End: 2016-05-07

## 2016-04-11 MED ORDER — METFORMIN HCL 1000 MG PO TABS
1000 | ORAL_TABLET | Freq: Two times a day (BID) | ORAL | 3 refills | Status: DC
Start: 2016-04-11 — End: 2017-04-10

## 2016-04-11 MED ORDER — LISINOPRIL 40 MG PO TABS
40 | ORAL_TABLET | ORAL | 5 refills | Status: DC
Start: 2016-04-11 — End: 2016-07-15

## 2016-04-11 MED ORDER — CLOPIDOGREL BISULFATE 75 MG PO TABS
75 | ORAL_TABLET | ORAL | 5 refills | Status: DC
Start: 2016-04-11 — End: 2016-05-07

## 2016-04-11 MED ORDER — ESOMEPRAZOLE MAGNESIUM 40 MG PO CPDR
40 | ORAL_CAPSULE | ORAL | 5 refills | Status: DC
Start: 2016-04-11 — End: 2016-09-27

## 2016-04-12 ENCOUNTER — Encounter: Attending: Family Medicine | Primary: Family

## 2016-04-12 NOTE — Telephone Encounter (Signed)
04/12/2016 [facility] pre op department called office needing patient scheduled with Dr. 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 Meryle Ready,  They need patient seen asap as patient have chest pains, shortness of breath on exertion.  Surgery not scheduled.  Please call [phone] with appointment date and time.da

## 2016-04-12 NOTE — Telephone Encounter (Signed)
appt made

## 2016-04-14 ENCOUNTER — Ambulatory Visit: Admit: 2016-04-14 | Discharge: 2016-04-14 | Payer: MEDICARE | Attending: Cardiovascular Disease | Primary: Family

## 2016-04-14 ENCOUNTER — Ambulatory Visit: Admit: 2016-04-14 | Discharge: 2016-04-14 | Payer: MEDICARE | Attending: Family Medicine | Primary: Family

## 2016-04-14 DIAGNOSIS — E119 Type 2 diabetes mellitus without complications: Secondary | ICD-10-CM

## 2016-04-14 DIAGNOSIS — I25708 Atherosclerosis of coronary artery bypass graft(s), unspecified, with other forms of angina pectoris: Secondary | ICD-10-CM

## 2016-04-14 LAB — POCT MICROALBUMIN
Creatinine Ur POCT: 100
Microalb, Ur: 10
Microalbumin Creatinine Ratio: <30

## 2016-04-14 MED ORDER — DOXYCYCLINE HYCLATE 100 MG PO TABS
100 MG | ORAL_TABLET | Freq: Two times a day (BID) | ORAL | 0 refills | Status: AC
Start: 2016-04-14 — End: 2016-04-24

## 2016-04-14 MED ORDER — GLIMEPIRIDE 4 MG PO TABS
4 MG | ORAL_TABLET | Freq: Every morning | ORAL | 3 refills | Status: DC
Start: 2016-04-14 — End: 2016-07-15

## 2016-04-14 NOTE — Progress Notes (Signed)
Micheal [name] is a 61 y.o. male that presents for Follow-up; Back Pain (having  future  back surgery down at [facility]  center ); Pharyngitis (off and on  for last couple months ); and Head Congestion        HPI:    Notes that he is having more back pain recently.  He is following at [facility] with spinal surgeon.  On recent testing had A1c of 8.5.  Surgeon would like this addressed prior to the surgery.    Diabetes Type 2    Glucose control:   Does patient check blood glucoses at home?  No  Report of hypoglycemia: no  Lab Results   Component Value Date    LABA1C 8.5 01/13/2016     No results found for: EAG    Symptoms  Polyuria, Polydipsia or Polyphagia?   No  Chest Pain, SOB, or Palpitations? -  No  New Vision complaints? No  Paresthesias of the extremities?  No    Medications  Current medication were reviewed.  Compliant with medications?  yes  Medication side effects?  No  On ACE-I or ARB?  Yes  On antiplatelet therapy?  Yes  On Statin?  Yes      Exercise  Exercise? No  Wt Readings from Last 3 Encounters:   04/14/16 217 lb (98.4 kg)   01/13/16 218 lb (98.9 kg)   08/17/15 201 lb 9.6 oz (91.4 kg)       Diet discipline?:  Low salt, fat, sugar diet?  No, states that he is eating a lot of surgery.    Blood pressure control:  BP Readings from Last 3 Encounters:   04/14/16 128/82   01/13/16 126/80   08/17/15 116/78       Lab Results   Component Value Date    LABMICR 1.28 09/03/2014       Lab Results   Component Value Date    LDLCALC 53 09/03/2014     URI Symptoms    HPI:      Symptoms have been present for 2 month(s).  Symptoms are unchanged since they initially started.    Fever? No  Runny nose or congestion?  Yes   Cough?  No  Sore throat?  Yes - dry/raw sensation  Headache, fatigue, joint pains, muscle aches?  Yes - maxillary pressure  Shortness of breath/Wheezing?  No  Nausea/Vomiting/Diarrhea?  No  Double Sickening?  No  Sick contacts? No        Health Maintenance   Topic Date Due   ??? Hepatitis C screen  09-Apr-1955   ???  Diabetic retinal exam  06/11/1964   ??? HIV screen  06/11/1969   ??? DTaP/Tdap/Td vaccine (1 - Tdap) 06/11/1973   ??? Shingles Vaccine (1 of 2 - 2 Dose Series) 06/11/2004   ??? Lipid screen  09/03/2015   ??? Potassium monitoring  09/03/2015   ??? Creatinine monitoring  09/03/2015   ??? Diabetic foot exam  07/14/2016   ??? Diabetic microalbuminuria test  07/14/2016   ??? A1C test (Diabetic or Prediabetic)  01/12/2017   ??? Colon cancer screen colonoscopy  07/25/2023   ??? Flu vaccine  Completed   ??? Pneumococcal med risk  Completed       Past Medical History:   Diagnosis Date   ??? CAD (coronary artery disease)    ??? Diabetes mellitus (HCC)    ??? Hyperlipidemia    ??? Hypertension        Past Surgical History:  Procedure Laterality Date   ??? CORONARY ARTERY BYPASS GRAFT     ??? ORTHOPEDIC SURGERY Left        Social History   Substance Use Topics   ??? Smoking status: Former Smoker     Types: Cigarettes     Quit date: 05/24/2008   ??? Smokeless tobacco: Never Used   ??? Alcohol use 7.2 oz/week     12 Cans of beer per week      Comment: 12 beers daily       Family History   Problem Relation Age of Onset   ??? Cancer Mother    ??? Heart Disease Brother    ??? Stroke Brother    ??? Diabetes Brother    ??? Early Death Brother    ??? High Blood Pressure Brother    ??? High Cholesterol Brother    ??? Mental Retardation Sister          I have reviewed the patient's past medical history, past surgical history, allergies, medications, social and family history and I have made updates where appropriate.    ROS        PHYSICAL EXAM:  BP 128/82    Pulse 80    Temp 98.4 ??F (36.9 ??C) (Oral)    Resp 12    Ht 5' 7.5" (1.715 m)    Wt 217 lb (98.4 kg)    BMI 33.49 kg/m??     General Appearance: well developed and well- nourished, in no acute distress  Head: normocephalic and atraumatic  ENT: external ear and ear canal normal bilaterally, nose without deformity, nasal mucosa and turbinates normal without polyps, oropharynx normal, dentition is normal for age, no lip or gum lesions  noted  Neck: supple and non-tender without mass, no thyromegaly or thyroid nodules, no cervical lymphadenopathy  Pulmonary/Chest: clear to auscultation bilaterally- no wheezes, rales or rhonchi, normal air movement, no respiratory distress or retractions  Cardiovascular: normal rate, regular rhythm, normal S1 and S2, no murmurs, rubs, clicks, or gallops, distal pulses intact  Abdomen: soft, non-tender, non-distended, no rebound or guarding, no masses or hernias noted, no hepatospleenomegaly  Extremities: no cyanosis, clubbing or edema of the lower extremities  Psych:  Normal affect without evidence of depression or anxiety, insight and judgement are appropriate, memory appears intact  Skin: warm and dry, no rash or erythema      ASSESSMENT & PLAN  Perley was seen today for follow-up, back pain, pharyngitis and head congestion.    Diagnoses and all orders for this visit:    Type 2 diabetes mellitus without complication, without long-term current use of insulin (HCC)  -     glimepiride (AMARYL) 4 MG tablet; Take 1 tablet by mouth every morning  -     POCT microalbumin    Subacute maxillary sinusitis  -     doxycycline hyclate (VIBRA-TABS) 100 MG tablet; Take 1 tablet by mouth 2 times daily for 10 days    Essential hypertension    -Add amaryl for uncontrolled DM2  -Will do doxy for ARBS  -HTN controlled, continue current meds  -Patient advised to call immediately or go to ER if any worsening of symptoms      Return in about 3 months (around 07/15/2016).    Controlled Substances Monitoring:                     Jonerik received counseling on the following healthy behaviors: medication adherence  Reviewed prior labs and health  maintenance.  Continue current medications, diet and exercise.  Discussed use, benefit, and side effects of prescribed medications. Barriers to medication compliance addressed.   Patient given educational materials - see patient instructions.    All patient questions answered.  Patient voiced  understanding.

## 2016-04-14 NOTE — Progress Notes (Signed)
Pt here for check up   Pt states he has been having chest pain, does not radiate anywhere   Also has been having some SOB on exertion and at rest   Also needs clearance for lymphoma removal [facility] back [facility]

## 2016-04-14 NOTE — Patient Instructions (Signed)
You may receive a survey about your visit with us today. The feedback from our patients helps us identify what is working well and where the service to all patients can be enhanced. Thank you!

## 2016-04-14 NOTE — Progress Notes (Signed)
[facility]  [facility]  [address]  [address]  [place] [address]  Dept: [phone]  Dept Fax: [phone]  Loc: [phone]    Visit Date: 04/14/2016    Micheal [name] is a 61 y.o. male who presents today for:  Chief Complaint   Patient presents with   ??? Check-Up     chest pain    ??? Chest Pain   ??? Coronary Artery Disease   ??? Hypertension     Going for lymphoma surgery from the back ??   Done at the [facility]  Known CABG and stents in [place]  Not the best historian   Does have chronic chest pain   No use of nitro   Does have dyspnea on exertion       HPI:  HPI  Past Medical History:   Diagnosis Date   ??? CAD (coronary artery disease)    ??? Diabetes mellitus (HCC)    ??? Hyperlipidemia    ??? Hypertension       Past Surgical History:   Procedure Laterality Date   ??? CORONARY ARTERY BYPASS GRAFT     ??? ORTHOPEDIC SURGERY Left      Family History   Problem Relation Age of Onset   ??? Cancer Mother    ??? Heart Disease Brother    ??? Stroke Brother    ??? Diabetes Brother    ??? Early Death Brother    ??? High Blood Pressure Brother    ??? High Cholesterol Brother    ??? Mental Retardation Sister      Social History   Substance Use Topics   ??? Smoking status: Former Smoker     Types: Cigarettes     Quit date: 05/24/2008   ??? Smokeless tobacco: Never Used   ??? Alcohol use 7.2 oz/week     12 Cans of beer per week      Comment: 12 beers daily      Current Outpatient Prescriptions   Medication Sig Dispense Refill   ??? glimepiride (AMARYL) 4 MG tablet Take 1 tablet by mouth every morning 30 tablet 3   ??? doxycycline hyclate (VIBRA-TABS) 100 MG tablet Take 1 tablet by mouth 2 times daily for 10 days 20 tablet 0   ??? fenofibrate (TRICOR) 145 MG tablet TAKE 1 TABLET BY MOUTH DAILY 30 tablet 5   ??? rosuvastatin (CRESTOR) 40 MG tablet TAKE 1 TABLET BY MOUTH EVERY MORNING 30 tablet 5   ??? esomeprazole (NEXIUM) 40 MG delayed release capsule TAKE 1 CAPSULE BY MOUTH EVERY MORNING BEFORE BREAKFAST 30 capsule 5   ??? clopidogrel (PLAVIX) 75  MG tablet TAKE 1 TABLET BY MOUTH DAILY 30 tablet 5   ??? metoprolol succinate (TOPROL XL) 100 MG extended release tablet TAKE 1 AND 1/2 TABLETS BY MOUTH DAILY 135 tablet 5   ??? lisinopril (PRINIVIL;ZESTRIL) 40 MG tablet TAKE 1 TABLET BY MOUTH DAILY 30 tablet 5   ??? metFORMIN (GLUCOPHAGE) 1000 MG tablet Take 1 tablet by mouth 2 times daily (with meals) 180 tablet 3   ??? oxyCODONE-acetaminophen (PERCOCET) 10-325 MG per tablet Take 1 tablet by mouth every 8 hours as needed for Pain for up to 30 days. 90 tablet 0   ??? citalopram (CELEXA) 40 MG tablet Take 1 tablet by mouth daily 90 tablet 1   ??? busPIRone (BUSPAR) 7.5 MG tablet Take 1 tablet by mouth 2 times daily 60 tablet 2   ??? hydrocortisone 2.5 %  cream Apply to external ear BID PRN 1 Tube 2   ??? olopatadine (PATANOL) 0.1 % ophthalmic solution Place 1 drop into both eyes 2 times daily 1 Bottle 5   ??? fluticasone (FLONASE) 50 MCG/ACT nasal spray 2 sprays by Nasal route daily 1 Bottle 5   ??? Handicap Placard MISC by Does not apply route Expires 03/01/2020 1 each 0   ??? ranolazine (RANEXA) 500 MG extended release tablet Take 1 tablet by mouth 2 times daily 180 tablet 3   ??? colesevelam (WELCHOL) 3.75 G PACK Take 1 packet by mouth Daily with lunch 30 each 6   ??? Multiple Vitamins-Minerals (CENTRUM SILVER ULTRA MENS) TABS Take 1 tablet by mouth daily     ??? nitroGLYCERIN (NITROSTAT) 0.4 MG SL tablet Place 0.4 mg under the tongue every 5 minutes as needed for Chest pain       No current facility-administered medications for this visit.      No Known Allergies  Health Maintenance   Topic Date Due   ??? Hepatitis C screen  1955/02/06   ??? Diabetic retinal exam  06/11/1964   ??? HIV screen  06/11/1969   ??? DTaP/Tdap/Td vaccine (1 - Tdap) 06/11/1973   ??? Shingles Vaccine (1 of 2 - 2 Dose Series) 06/11/2004   ??? Lipid screen  09/03/2015   ??? Potassium monitoring  09/03/2015   ??? Creatinine monitoring  09/03/2015   ??? Diabetic foot exam  07/14/2016   ??? Diabetic microalbuminuria test  07/14/2016   ??? A1C  test (Diabetic or Prediabetic)  01/12/2017   ??? Colon cancer screen colonoscopy  07/25/2023   ??? Flu vaccine  Completed   ??? Pneumococcal med risk  Completed       Subjective:  Review of Systems  General:   No fever, no chills, some fatigue or weight loss  Pulmonary:    some dyspnea, no wheezing  Cardiac:    Denies recent chest pain,   GI:     No nausea or vomiting, no abdominal pain  Neuro:     No dizziness or light headedness,   Musculoskeletal:  No recent active issues  Extremities:   No edema, good peripheral pulses      Objective:  Physical Exam  BP 134/74    Pulse 70    Ht 5\' 7"  (1.702 m)    Wt 215 lb (97.5 kg)    BMI 33.67 kg/m??   General:   Well developed, well nourished  Lungs:    Clear to auscultation  Heart:    Normal S1 S2, Slight murmur. no rubs, no gallops  Abdomen:   Soft, non tender, no organomegalies, positive bowel sounds  Extremities:   No edema, no cyanosis, good peripheral pulses  Neurological:   Awake, alert, oriented. No obvious focal deficits  Musculoskelatal:  No obvious deformities    Assessment:     1. Coronary artery disease of bypass graft of native heart with stable angina pectoris (HCC)     2. Essential hypertension     3. Familial hypercholesterolemia     cardiac as above  Higher risk patient      Plan:  No Follow-up on file.  Non invasive cardiac testing will be ordered to further evaluate for any ischemic or structural heart disease as a cause of the patient symptoms. We will proceed with a Stress Cardiolite test and echo soon.  Continue risk factor modification and medical management  Thank you for allowing me to participate in the care  of your patient. Please don't hesitate to contact me regarding any further issues related to the patient care    Orders Placed:  No orders of the defined types were placed in this encounter.      Medications Prescribed:  No orders of the defined types were placed in this encounter.         Discussed use, benefit, and side effects of prescribed  medications. All patient questions answered. Pt voiced understanding. Instructed to continue current medications, diet and exercise. Continue risk factor modification and medical management. Patient agreed with treatment plan. Follow up as directed.    Electronically signed by [name] Balboa, MD on 04/14/2016 at 3:14 PM

## 2016-04-14 NOTE — Progress Notes (Signed)
Have you changed or started any medications since your last visit including any over-the-counter medicines, vitamins, or herbal medicines? no   Are you having any side effects from any of your medications? -  no  Have you stopped taking any of your medications? Is so, why? -  no    Have you seen any other physician or provider since your last visit? Yes - Records Obtained  Have you had any other diagnostic tests since your last visit? Yes - Records Obtained  Have you been seen in the emergency room and/or had an admission to a hospital since we last saw you? No  Have you had your routine dental cleaning in the past 6 months? no    Have you activated your MyChart account? If not, what are your barriers? No:      Patient Care Team:  [name], DO as PCP - General (Family Medicine)  [name], DO as PCP - [facility] Attributed Provider    Medical History Review  Past Medical, Family, and Social History     Defer to provider.
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
## 2016-04-20 ENCOUNTER — Encounter

## 2016-04-20 MED ORDER — OXYCODONE-ACETAMINOPHEN 10-325 MG PO TABS
10-325 | ORAL_TABLET | Freq: Three times a day (TID) | ORAL | 0 refills | Status: DC | PRN
Start: 2016-04-20 — End: 2016-05-19

## 2016-04-20 NOTE — Telephone Encounter (Signed)
ASSESSMENT & PLAN  1. Chronic bilateral low back pain with bilateral sciatica    - oxyCODONE-acetaminophen (PERCOCET) 10-325 MG per tablet; Take 1 tablet by mouth every 8 hours as needed for Pain for up to 30 days.  Dispense: 90 tablet; Refill: 0      OAARS reviewed and appropriate.     Controlled Substances Monitoring: Attestation: The Prescription Monitoring Report for this patient was reviewed today. Stark Klein(Weyman Bogdon C Alyssha Housh, DO)  Documentation: Possible medication side effects, risk of tolerance/dependence & alternative treatments discussed., No signs of potential drug abuse or diversion identified. Stark Klein(Estoria Geary C Kadisha Goodine, DO)      Future Appointments  Date Time Provider Department Center   05/02/2016 1:00 PM STR ECHO RM2 [facility] ECHO None   05/02/2016 2:00 PM STR NUC MED HC  INJECT  RM2 STR[facility] STRESS None   05/02/2016 2:30 PM STR [facility]UCLEAR [facility] ROOM 1 [facility] STRESS None   05/02/2016 3:00 PM STR NM STRESS RM1 [facility] STRESS None   05/02/2016 3:30 PM STR [facility] ROOM 1 STR[facility] STRESS None   07/15/2016 10:00 AM Cecille RubinMark D Kahle, DO [facility] [place]   04/14/2017 10:00 AM Zoheir Derl BarrowA Abdelbaki, MD [facility] [place]

## 2016-05-02 ENCOUNTER — Inpatient Hospital Stay: Admit: 2016-05-02 | Payer: MEDICARE | Primary: Family

## 2016-05-02 DIAGNOSIS — I25708 Atherosclerosis of coronary artery bypass graft(s), unspecified, with other forms of angina pectoris: Secondary | ICD-10-CM

## 2016-05-02 LAB — ECHOCARDIOGRAM COMPLETE 2D W DOPPLER W COLOR: Left Ventricular Ejection Fraction: 50

## 2016-05-02 MED ORDER — REGADENOSON 0.4 MG/5ML IV SOLN
0.4 | INTRAVENOUS | Status: AC
Start: 2016-05-02 — End: 2016-05-02

## 2016-05-02 MED ORDER — TECHNETIUM TC 99M SESTAMIBI - CARDIOLITE
Freq: Once | Status: AC | PRN
Start: 2016-05-02 — End: 2016-05-02
  Administered 2016-05-02: 19:00:00 31 via INTRAVENOUS

## 2016-05-02 MED ORDER — TECHNETIUM TC 99M SESTAMIBI - CARDIOLITE
Freq: Once | Status: AC | PRN
Start: 2016-05-02 — End: 2016-05-02
  Administered 2016-05-02: 18:00:00 9.2 via INTRAVENOUS

## 2016-05-02 MED FILL — LEXISCAN 0.4 MG/5ML IV SOLN: 0.4 MG/5ML | INTRAVENOUS | Qty: 5

## 2016-05-02 NOTE — Telephone Encounter (Signed)
Abnormal stress test.

## 2016-05-02 NOTE — Telephone Encounter (Signed)
Moderate risk for surgery   Might need a cath after his lymphoma surgery is done

## 2016-05-03 NOTE — Telephone Encounter (Signed)
appt made for May 14 to discuss

## 2016-05-03 NOTE — Telephone Encounter (Signed)
Lm for pt to call office to get cath set up. Form faxed for his clearance.

## 2016-05-06 ENCOUNTER — Inpatient Hospital Stay: Admit: 2016-05-06 | Payer: MEDICARE | Primary: Family

## 2016-05-06 ENCOUNTER — Inpatient Hospital Stay
Admission: EM | Admit: 2016-05-06 | Discharge: 2016-05-07 | Disposition: A | Discharge status: Home / Self Care | Payer: Medicare Other | Source: Other Acute Inpatient Hospital | Admitting: Internal Medicine

## 2016-05-06 ENCOUNTER — Emergency Department: Admit: 2016-05-06 | Payer: MEDICARE | Primary: Family

## 2016-05-06 DIAGNOSIS — T82855A Stenosis of coronary artery stent, initial encounter: Secondary | ICD-10-CM

## 2016-05-06 LAB — EKG 12-LEAD
Atrial Rate: 74 {beats}/min
Atrial Rate: 76 {beats}/min
Atrial Rate: 79 {beats}/min
P Axis: 56 degrees
P Axis: 57 degrees
P Axis: 60 degrees
P-R Interval: 148 ms
P-R Interval: 152 ms
P-R Interval: 156 ms
Q-T Interval: 368 ms
Q-T Interval: 372 ms
Q-T Interval: 378 ms
QRS Duration: 90 ms
QRS Duration: 92 ms
QRS Duration: 96 ms
QTc Calculation (Bazett): 418 ms
QTc Calculation (Bazett): 419 ms
QTc Calculation (Bazett): 421 ms
R Axis: 27 degrees
R Axis: 31 degrees
R Axis: 32 degrees
T Axis: 34 degrees
T Axis: 51 degrees
T Axis: 57 degrees
Ventricular Rate: 74 {beats}/min
Ventricular Rate: 76 {beats}/min
Ventricular Rate: 79 {beats}/min

## 2016-05-06 LAB — BASIC METABOLIC PANEL W/ REFLEX TO MG FOR LOW K
BUN: 14 mg/dL (ref 7–22)
CO2: 26 meq/L (ref 23–33)
Calcium: 9.4 mg/dL (ref 8.5–10.5)
Chloride: 101 meq/L (ref 98–111)
Creatinine: 1.1 mg/dL (ref 0.4–1.2)
Glucose: 165 mg/dL — ABNORMAL HIGH (ref 70–108)
Potassium reflex Magnesium: 4.2 meq/L (ref 3.5–5.2)
Sodium: 139 meq/L (ref 135–145)

## 2016-05-06 LAB — CBC WITH AUTO DIFFERENTIAL
Basophils Absolute: 0 10*3/uL (ref 0.0–0.1)
Basophils: 0.4 %
Eosinophils Absolute: 0.1 10*3/uL (ref 0.0–0.4)
Eosinophils: 2.1 %
Hematocrit: 39.4 % — ABNORMAL LOW (ref 42.0–52.0)
Hemoglobin: 13.1 gm/dl — ABNORMAL LOW (ref 14.0–18.0)
Lymphocytes Absolute: 1.5 10*3/uL (ref 1.0–4.8)
Lymphocytes: 21.4 %
MCH: 29.2 pg (ref 27.0–31.0)
MCHC: 33.2 gm/dl (ref 33.0–37.0)
MCV: 88.1 fL (ref 80.0–94.0)
MPV: 9.6 fL (ref 7.4–10.4)
Monocytes Absolute: 0.4 10*3/uL (ref 0.4–1.3)
Monocytes: 6.3 %
Platelets: 224 10*3/uL (ref 130–400)
RBC: 4.47 10*6/uL — ABNORMAL LOW (ref 4.70–6.10)
RDW: 13.4 % (ref 11.5–14.5)
Seg Neutrophils: 69.8 %
Segs Absolute: 5 10*3/uL (ref 1.8–7.7)
WBC: 7.1 10*3/uL (ref 4.8–10.8)
nRBC: 0 /100 wbc

## 2016-05-06 LAB — OSMOLALITY: Osmolality Calc: 281.7 mOsmol/kg (ref >=275.0)

## 2016-05-06 LAB — TROPONIN
Troponin T: <0.01 ng/ml
Troponin T: <0.01 ng/ml
Troponin T: <0.01 ng/ml

## 2016-05-06 LAB — POCT GLUCOSE: POC Glucose: 102 mg/dl (ref 70–108)

## 2016-05-06 LAB — APTT: aPTT: 25 seconds (ref 22.0–38.0)

## 2016-05-06 LAB — BRAIN NATRIURETIC PEPTIDE: Pro-BNP: 51.8 pg/mL (ref 0.0–900.0)

## 2016-05-06 LAB — ANION GAP: Anion Gap: 12 meq/L (ref 8.0–16.0)

## 2016-05-06 LAB — GLOMERULAR FILTRATION RATE, ESTIMATED: Est, Glom Filt Rate: 68 mL/min/{1.73_m2} — AB

## 2016-05-06 MED ORDER — ONDANSETRON HCL 4 MG PO TABS
4 MG | Freq: Once | ORAL | Status: AC
Start: 2016-05-06 — End: 2016-05-06
  Administered 2016-05-06: 10:00:00 4 mg via ORAL

## 2016-05-06 MED ORDER — LIDOCAINE HCL 2 % IJ SOLN
2 | INTRAMUSCULAR | Status: AC
Start: 2016-05-06 — End: 2016-05-06

## 2016-05-06 MED ORDER — MIDAZOLAM HCL 2 MG/2ML IJ SOLN
2 | INTRAMUSCULAR | Status: AC
Start: 2016-05-06 — End: 2016-05-06

## 2016-05-06 MED ORDER — SODIUM CHLORIDE 0.9 % IV SOLN
0.9 % | INTRAVENOUS | Status: DC
Start: 2016-05-06 — End: 2016-05-07

## 2016-05-06 MED ORDER — MAGNESIUM HYDROXIDE 400 MG/5ML PO SUSP
400 MG/5ML | Freq: Every day | ORAL | Status: DC | PRN
Start: 2016-05-06 — End: 2016-05-06

## 2016-05-06 MED ORDER — ROSUVASTATIN CALCIUM 10 MG PO TABS
10 MG | Freq: Every evening | ORAL | Status: DC
Start: 2016-05-06 — End: 2016-05-06

## 2016-05-06 MED ORDER — ACETAMINOPHEN 325 MG PO TABS
325 MG | ORAL | Status: DC | PRN
Start: 2016-05-06 — End: 2016-05-07

## 2016-05-06 MED ORDER — ASPIRIN 81 MG PO CHEW
81 | Freq: Once | ORAL | Status: AC
Start: 2016-05-06 — End: 2016-05-06
  Administered 2016-05-06: 09:00:00 324 mg via ORAL

## 2016-05-06 MED ORDER — LORAZEPAM 0.5 MG PO TABS
0.5 MG | Freq: Two times a day (BID) | ORAL | Status: DC
Start: 2016-05-06 — End: 2016-05-07
  Administered 2016-05-07 (×2): 0.5 mg via ORAL

## 2016-05-06 MED ORDER — NITROGLYCERIN 500 MCG / 10 ML VIAL
Status: AC
Start: 2016-05-06 — End: 2016-05-06

## 2016-05-06 MED ORDER — NITROGLYCERIN IN D5W 200-5 MCG/ML-% IV SOLN
200-5 MCG/ML-% | INTRAVENOUS | Status: DC
Start: 2016-05-06 — End: 2016-05-07
  Administered 2016-05-06: 09:00:00 10 ug/min via INTRAVENOUS

## 2016-05-06 MED ORDER — BUSPIRONE HCL 5 MG PO TABS
5 MG | Freq: Two times a day (BID) | ORAL | Status: DC
Start: 2016-05-06 — End: 2016-05-07
  Administered 2016-05-07 (×2): 7.5 mg via ORAL

## 2016-05-06 MED ORDER — NORMAL SALINE FLUSH 0.9 % IV SOLN
0.9 % | INTRAVENOUS | Status: DC | PRN
Start: 2016-05-06 — End: 2016-05-06

## 2016-05-06 MED ORDER — MORPHINE SULFATE 4 MG/ML IJ SOLN
4 MG/ML | INTRAMUSCULAR | Status: DC | PRN
Start: 2016-05-06 — End: 2016-05-07

## 2016-05-06 MED ORDER — MAGNESIUM SULFATE 50 % IJ SOLN
50 % | INTRAMUSCULAR | Status: DC | PRN
Start: 2016-05-06 — End: 2016-05-07

## 2016-05-06 MED ORDER — ATORVASTATIN CALCIUM 80 MG PO TABS
80 MG | Freq: Every evening | ORAL | Status: DC
Start: 2016-05-06 — End: 2016-05-07
  Administered 2016-05-07: 02:00:00 80 mg via ORAL

## 2016-05-06 MED ORDER — PANTOPRAZOLE SODIUM 40 MG PO TBEC
40 MG | Freq: Every day | ORAL | Status: DC
Start: 2016-05-06 — End: 2016-05-07
  Administered 2016-05-07: 10:00:00 40 mg via ORAL

## 2016-05-06 MED ORDER — POTASSIUM CHLORIDE 10 MEQ/100ML IV SOLN
10 MEQ/0ML | INTRAVENOUS | Status: DC | PRN
Start: 2016-05-06 — End: 2016-05-07

## 2016-05-06 MED ORDER — OLOPATADINE HCL 0.1 % OP SOLN
0.1 % | Freq: Two times a day (BID) | OPHTHALMIC | Status: DC
Start: 2016-05-06 — End: 2016-05-06

## 2016-05-06 MED ORDER — FLUTICASONE PROPIONATE 50 MCG/ACT NA SUSP
50 MCG/ACT | Freq: Every day | NASAL | Status: DC
Start: 2016-05-06 — End: 2016-05-06

## 2016-05-06 MED ORDER — ONDANSETRON HCL 4 MG/2ML IJ SOLN
4 MG/2ML | Freq: Four times a day (QID) | INTRAMUSCULAR | Status: DC | PRN
Start: 2016-05-06 — End: 2016-05-06

## 2016-05-06 MED ORDER — ASPIRIN 81 MG PO CHEW
81 | Freq: Every day | ORAL | Status: DC
Start: 2016-05-06 — End: 2016-05-07
  Administered 2016-05-07: 12:00:00 81 mg via ORAL

## 2016-05-06 MED ORDER — ISOSORBIDE MONONITRATE ER 30 MG PO TB24
30 MG | Freq: Every day | ORAL | Status: DC
Start: 2016-05-06 — End: 2016-05-07
  Administered 2016-05-06 – 2016-05-07 (×2): 30 mg via ORAL

## 2016-05-06 MED ORDER — GLIMEPIRIDE 4 MG PO TABS
4 MG | Freq: Every morning | ORAL | Status: DC
Start: 2016-05-06 — End: 2016-05-07
  Administered 2016-05-07: 12:00:00 4 mg via ORAL

## 2016-05-06 MED ORDER — HEPARIN SODIUM (PORCINE) 1000 UNIT/ML IJ SOLN
1000 | INTRAMUSCULAR | Status: AC
Start: 2016-05-06 — End: 2016-05-06

## 2016-05-06 MED ORDER — LISINOPRIL 40 MG PO TABS
40 MG | Freq: Every day | ORAL | Status: DC
Start: 2016-05-06 — End: 2016-05-07
  Administered 2016-05-06 – 2016-05-07 (×2): 40 mg via ORAL

## 2016-05-06 MED ORDER — OXYCODONE-ACETAMINOPHEN 10-325 MG PO TABS
10-325 MG | Freq: Three times a day (TID) | ORAL | Status: DC | PRN
Start: 2016-05-06 — End: 2016-05-07
  Administered 2016-05-06 – 2016-05-07 (×3): 1 via ORAL

## 2016-05-06 MED ORDER — POTASSIUM CHLORIDE CRYS ER 20 MEQ PO TBCR
20 MEQ | ORAL | Status: DC | PRN
Start: 2016-05-06 — End: 2016-05-07

## 2016-05-06 MED ORDER — TICAGRELOR 90 MG PO TABS
90 | ORAL | Status: AC
Start: 2016-05-06 — End: 2016-05-06

## 2016-05-06 MED ORDER — ONDANSETRON HCL 4 MG/2ML IJ SOLN
4 MG/2ML | Freq: Four times a day (QID) | INTRAMUSCULAR | Status: DC | PRN
Start: 2016-05-06 — End: 2016-05-07

## 2016-05-06 MED ORDER — MORPHINE SULFATE 2 MG/ML IJ SOLN
2 MG/ML | INTRAMUSCULAR | Status: DC | PRN
Start: 2016-05-06 — End: 2016-05-07
  Administered 2016-05-06 (×2): 2 mg via INTRAVENOUS

## 2016-05-06 MED ORDER — FENTANYL CITRATE (PF) 100 MCG/2ML IJ SOLN
100 | INTRAMUSCULAR | Status: AC
Start: 2016-05-06 — End: 2016-05-06

## 2016-05-06 MED ORDER — HEPARIN SODIUM (PORCINE) 1000 UNIT/ML IJ SOLN
1000 UNIT/ML | INTRAMUSCULAR | Status: DC | PRN
Start: 2016-05-06 — End: 2016-05-06

## 2016-05-06 MED ORDER — HEPARIN SODIUM (PORCINE) 1000 UNIT/ML IJ SOLN
1000 UNIT/ML | Freq: Once | INTRAMUSCULAR | Status: AC
Start: 2016-05-06 — End: 2016-05-06
  Administered 2016-05-06: 12:00:00 4000 [IU] via INTRAVENOUS

## 2016-05-06 MED ORDER — TICAGRELOR 90 MG PO TABS
90 MG | Freq: Two times a day (BID) | ORAL | Status: DC
Start: 2016-05-06 — End: 2016-05-07
  Administered 2016-05-07 (×2): 90 mg via ORAL

## 2016-05-06 MED ORDER — TAB-A-VITE PO TABS
Freq: Every day | ORAL | Status: DC
Start: 2016-05-06 — End: 2016-05-07
  Administered 2016-05-07: 12:00:00 1 via ORAL

## 2016-05-06 MED ORDER — HEPARIN SOD (PORCINE) IN D5W 100 UNIT/ML IV SOLN
100 UNIT/ML | INTRAVENOUS | Status: DC
Start: 2016-05-06 — End: 2016-05-06

## 2016-05-06 MED ORDER — SODIUM CHLORIDE 0.9 % IV SOLN
0.9 % | INTRAVENOUS | Status: DC
Start: 2016-05-06 — End: 2016-05-06

## 2016-05-06 MED ORDER — ACETAMINOPHEN 325 MG PO TABS
325 MG | ORAL | Status: DC | PRN
Start: 2016-05-06 — End: 2016-05-06
  Administered 2016-05-06: 15:00:00 650 mg via ORAL

## 2016-05-06 MED ORDER — NORMAL SALINE FLUSH 0.9 % IV SOLN
0.9 | Freq: Two times a day (BID) | INTRAVENOUS | Status: DC
Start: 2016-05-06 — End: 2016-05-07
  Administered 2016-05-07: 12:00:00 10 mL via INTRAVENOUS

## 2016-05-06 MED ORDER — MAGNESIUM HYDROXIDE 400 MG/5ML PO SUSP
400 | Freq: Every day | ORAL | Status: DC | PRN
Start: 2016-05-06 — End: 2016-05-07

## 2016-05-06 MED ORDER — NORMAL SALINE FLUSH 0.9 % IV SOLN
0.9 % | Freq: Two times a day (BID) | INTRAVENOUS | Status: DC
Start: 2016-05-06 — End: 2016-05-06

## 2016-05-06 MED ORDER — FENOFIBRATE 145 MG PO TABS
145 MG | Freq: Every day | ORAL | Status: DC
Start: 2016-05-06 — End: 2016-05-06

## 2016-05-06 MED ORDER — HEPARIN SOD (PORCINE) IN D5W 100 UNIT/ML IV SOLN
100 UNIT/ML | INTRAVENOUS | Status: DC
Start: 2016-05-06 — End: 2016-05-06
  Administered 2016-05-06: 12:00:00 10 [IU]/kg/h via INTRAVENOUS

## 2016-05-06 MED ORDER — NITROGLYCERIN 0.4 MG SL SUBL
0.4 MG | SUBLINGUAL | Status: DC | PRN
Start: 2016-05-06 — End: 2016-05-07

## 2016-05-06 MED ORDER — METOPROLOL SUCCINATE ER 25 MG PO TB24
25 MG | Freq: Every day | ORAL | Status: DC
Start: 2016-05-06 — End: 2016-05-07
  Administered 2016-05-06 – 2016-05-07 (×2): 25 mg via ORAL

## 2016-05-06 MED ORDER — POTASSIUM CHLORIDE 20 MEQ/15ML (10%) PO SOLN
20 | ORAL | Status: DC | PRN
Start: 2016-05-06 — End: 2016-05-07

## 2016-05-06 MED ORDER — NORMAL SALINE FLUSH 0.9 % IV SOLN
0.9 | INTRAVENOUS | Status: DC | PRN
Start: 2016-05-06 — End: 2016-05-07

## 2016-05-06 MED FILL — LISINOPRIL 40 MG PO TABS: 40 MG | ORAL | Qty: 1

## 2016-05-06 MED FILL — LIPITOR 80 MG PO TABS: 80 MG | ORAL | Qty: 1

## 2016-05-06 MED FILL — BRILINTA 90 MG PO TABS: 90 MG | ORAL | Qty: 2

## 2016-05-06 MED FILL — BRILINTA 90 MG PO TABS: 90 MG | ORAL | Qty: 1

## 2016-05-06 MED FILL — ISOSORBIDE MONONITRATE ER 30 MG PO TB24: 30 MG | ORAL | Qty: 1

## 2016-05-06 MED FILL — OXYCODONE-ACETAMINOPHEN 10-325 MG PO TABS: 10-325 MG | ORAL | Qty: 1

## 2016-05-06 MED FILL — METOPROLOL SUCCINATE ER 25 MG PO TB24: 25 MG | ORAL | Qty: 1

## 2016-05-06 MED FILL — MORPHINE SULFATE 2 MG/ML IJ SOLN: 2 MG/ML | INTRAMUSCULAR | Qty: 1

## 2016-05-06 MED FILL — BUSPIRONE HCL 5 MG PO TABS: 5 MG | ORAL | Qty: 2

## 2016-05-06 MED FILL — FENTANYL CITRATE (PF) 100 MCG/2ML IJ SOLN: 100 MCG/2ML | INTRAMUSCULAR | Qty: 2

## 2016-05-06 MED FILL — PANTOPRAZOLE SODIUM 40 MG PO TBEC: 40 MG | ORAL | Qty: 1

## 2016-05-06 MED FILL — MORPHINE SULFATE 2 MG/ML IJ SOLN: 2 mg/mL | INTRAMUSCULAR | Qty: 1

## 2016-05-06 MED FILL — MIDAZOLAM HCL 2 MG/2ML IJ SOLN: 2 MG/ML | INTRAMUSCULAR | Qty: 2

## 2016-05-06 MED FILL — CRESTOR 10 MG PO TABS: 10 MG | ORAL | Qty: 4

## 2016-05-06 MED FILL — THERA PO TABS: ORAL | Qty: 1

## 2016-05-06 MED FILL — HEPARIN SODIUM (PORCINE) 1000 UNIT/ML IJ SOLN: 1000 UNIT/ML | INTRAMUSCULAR | Qty: 10

## 2016-05-06 MED FILL — NITROGLYCERIN IN D5W 200-5 MCG/ML-% IV SOLN: 200-5 MCG/ML-% | INTRAVENOUS | Qty: 250

## 2016-05-06 MED FILL — HEPARIN SOD (PORCINE) IN D5W 100 UNIT/ML IV SOLN: 100 UNIT/ML | INTRAVENOUS | Qty: 250

## 2016-05-06 MED FILL — ONDANSETRON HCL 4 MG PO TABS: 4 MG | ORAL | Qty: 1

## 2016-05-06 MED FILL — NITROGLYCERIN 500 MCG / 10 ML VIAL: Qty: 1

## 2016-05-06 MED FILL — GLIMEPIRIDE 4 MG PO TABS: 4 MG | ORAL | Qty: 1

## 2016-05-06 MED FILL — LIDOCAINE HCL 2 % IJ SOLN: 2 % | INTRAMUSCULAR | Qty: 20

## 2016-05-06 MED FILL — ASPIRIN 81 MG PO CHEW: 81 MG | ORAL | Qty: 4

## 2016-05-06 NOTE — ED Provider Notes (Addendum)
[facility]     eMERGENCY dEPARTMENT eNCOUnter         Pt Name: Micheal [name]  MRN: [id number]  Birthdate 26-May-1955  Date of evaluation: 05/06/2016  Provider: Lajean Silvius, MD    CHIEF COMPLAINT       Chief Complaint   Patient presents with   . Chest Pain       Nurses Notes reviewed and I agree except as noted in the HPI.    HISTORY OF PRESENT ILLNESS    Micheal [name] is a 61 y.o. male who presents with complaints of chest pain. Patient reports this began at approximately midnight. Patient reports associated diaphoresis, shortness of breath, nausea, anxiety, and rhinorrhea. Patient denies taking ASA or Nitro. Patient reports his cardiologist is Dr. Gabrielle Dare. Patient reports last week he had a stress test that showed inferior ischemia. Patient is on Aspirin and Plavix. Patient denies any dizziness. Patient has history of CAD, diabetes, and CABG. No other complaints at this time.     This HPI was provided by the patient.    REVIEW OF SYSTEMS     Review of Systems   Constitutional: Positive for diaphoresis. Negative for chills, fever and unexpected weight change.   HENT: Positive for rhinorrhea. Negative for congestion, ear pain and sore throat.    Eyes: Negative for visual disturbance.   Respiratory: Positive for shortness of breath. Negative for cough and wheezing.    Cardiovascular: Positive for chest pain. Negative for palpitations and leg swelling.   Gastrointestinal: Positive for nausea. Negative for abdominal pain, blood in stool, diarrhea and vomiting.   Genitourinary: Negative for dysuria and hematuria.   Musculoskeletal: Negative for back pain, joint swelling and neck pain.   Skin: Negative for rash.   Allergic/Immunologic: Negative for environmental allergies.   Neurological: Negative for dizziness, syncope, weakness and headaches.   Hematological: Does not bruise/bleed easily.   Psychiatric/Behavioral: Negative for confusion and dysphoric mood. The patient is nervous/anxious.    All  other systems reviewed and are negative.      PAST MEDICAL HISTORY    has a past medical history of CAD (coronary artery disease); Diabetes mellitus (HCC); Hyperlipidemia; and Hypertension.    SURGICAL HISTORY      has a past surgical history that includes Coronary artery bypass graft and orthopedic surgery (Left).    CURRENT MEDICATIONS       Previous Medications    BUSPIRONE (BUSPAR) 7.5 MG TABLET    Take 1 tablet by mouth 2 times daily    CITALOPRAM (CELEXA) 40 MG TABLET    Take 1 tablet by mouth daily    CLOPIDOGREL (PLAVIX) 75 MG TABLET    TAKE 1 TABLET BY MOUTH DAILY    COLESEVELAM (WELCHOL) 3.75 G PACK    Take 1 packet by mouth Daily with lunch    ESOMEPRAZOLE (NEXIUM) 40 MG DELAYED RELEASE CAPSULE    TAKE 1 CAPSULE BY MOUTH EVERY MORNING BEFORE BREAKFAST    FENOFIBRATE (TRICOR) 145 MG TABLET    TAKE 1 TABLET BY MOUTH DAILY    FLUTICASONE (FLONASE) 50 MCG/ACT NASAL SPRAY    2 sprays by Nasal route daily    GLIMEPIRIDE (AMARYL) 4 MG TABLET    Take 1 tablet by mouth every morning    HANDICAP PLACARD MISC    by Does not apply route Expires 03/01/2020    HYDROCORTISONE 2.5 % CREAM    Apply to external ear BID PRN    LISINOPRIL (PRINIVIL;ZESTRIL)  40 MG TABLET    TAKE 1 TABLET BY MOUTH DAILY    METFORMIN (GLUCOPHAGE) 1000 MG TABLET    Take 1 tablet by mouth 2 times daily (with meals)    METOPROLOL SUCCINATE (TOPROL XL) 100 MG EXTENDED RELEASE TABLET    TAKE 1 AND 1/2 TABLETS BY MOUTH DAILY    MULTIPLE VITAMINS-MINERALS (CENTRUM SILVER ULTRA MENS) TABS    Take 1 tablet by mouth daily    NITROGLYCERIN (NITROSTAT) 0.4 MG SL TABLET    Place 0.4 mg under the tongue every 5 minutes as needed for Chest pain    OLOPATADINE (PATANOL) 0.1 % OPHTHALMIC SOLUTION    Place 1 drop into both eyes 2 times daily    OXYCODONE-ACETAMINOPHEN (PERCOCET) 10-325 MG PER TABLET    Take 1 tablet by mouth every 8 hours as needed for Pain for up to 30 days.    RANOLAZINE (RANEXA) 500 MG EXTENDED RELEASE TABLET    Take 1 tablet by mouth 2 times  daily    ROSUVASTATIN (CRESTOR) 40 MG TABLET    TAKE 1 TABLET BY MOUTH EVERY MORNING       ALLERGIES     has No Known Allergies.    FAMILY HISTORY     indicated that his mother is deceased. He indicated that his father is deceased. He indicated that his sister is deceased. He indicated that his brother is deceased.    family history includes Cancer in his mother; Diabetes in his brother; Early Death in his brother; Heart Disease in his brother; High Blood Pressure in his brother; High Cholesterol in his brother; Mental Retardation in his sister; Stroke in his brother.    SOCIAL HISTORY      reports that he quit smoking about 7 years ago. His smoking use included Cigarettes. He has never used smokeless tobacco. He reports that he drinks about 7.2 oz of alcohol per week . He reports that he does not use drugs.    PHYSICAL EXAM       ED Triage Vitals [05/06/16 0359]   BP Temp Temp src Pulse Resp SpO2 Height Weight   (!) 148/87 98.3 F (36.8 C) -- 83 18 98 % -- 215 lb (97.5 kg)      Physical Exam   Constitutional: He is oriented to person, place, and time. Vital signs are normal. He appears well-developed and well-nourished. He is cooperative.  Non-toxic appearance. He does not appear ill.   HENT:   Head: Normocephalic.   Right Ear: External ear normal. No drainage.   Left Ear: External ear normal. No drainage.   Nose: Nose normal. No epistaxis.   Mouth/Throat: Mucous membranes are not dry and not cyanotic.   Eyes: Conjunctivae and EOM are normal. Right eye exhibits no discharge. Left eye exhibits no discharge. No scleral icterus.   Neck: Trachea normal, normal range of motion and phonation normal. Neck supple. No tracheal deviation present.   Cardiovascular: Normal rate, regular rhythm, normal heart sounds and intact distal pulses.  Exam reveals no gallop and no friction rub.    No murmur heard.  Pulses:       Radial pulses are 2+ on the right side.   Pulmonary/Chest: Effort normal and breath sounds normal. No  stridor. No respiratory distress. He has no wheezes. He has no rales. He exhibits no tenderness.   Abdominal: Soft. Bowel sounds are normal. He exhibits no distension and no pulsatile midline mass. There is no tenderness. There is no rebound and  no guarding.   Musculoskeletal: Normal range of motion. He exhibits no edema or tenderness (No calf pain or tenderness.  No evidence of DVT).   Neurological: He is alert and oriented to person, place, and time. He has normal strength. He displays no tremor. He displays no seizure activity. Coordination normal. GCS eye subscore is 4. GCS verbal subscore is 5. GCS motor subscore is 6.   Skin: Skin is warm and dry. No rash (on exposed surfaced) noted. He is not diaphoretic. No cyanosis or erythema. No pallor.   Psychiatric: He has a normal mood and affect. His speech is normal and behavior is normal.   Nursing note and vitals reviewed.    DIFFERENTIAL DIAGNOSIS:   Including but not limited to: chest pain with moderate to high risk for ACS, abnormal outpatient stress test.     DIAGNOSTIC RESULTS     EKG: All EKG's are interpreted by the Emergency Department Physician who either signs or Co-signs this chart in the absence of a cardiologist.    Vent. Rate: 76 bpm  PR interval: 152 ms  QRS duration: 90 ms  QTc: 418 ms  P-R-T axes: 57, 32, 51  Normal sinus rhythm  No STEMI    RADIOLOGY: non-plain film images(s) such as CT, Ultrasound and MRI are read by the radiologist.    XR CHEST PORTABLE   Final Result   1.  No acute cardiopulmonary process.               **This report has been created using voice recognition software. It may contain minor errors which are inherent in voice recognition technology.**      Final report electronically signed by Dr. Earlyne Iba on 05/06/2016 4:40 AM          [x]  Visualized and interpreted by me   [x]  Radiologist's Wet Read Report Reviewed   []  Discussed with Radiologist.    LABS:   Results for orders placed or performed during the hospital encounter  of 05/06/16   CBC auto differential   Result Value Ref Range    WBC 7.1 4.8 - 10.8 thou/mm3    RBC 4.47 (L) 4.70 - 6.10 mill/mm3    Hemoglobin 13.1 (L) 14.0 - 18.0 gm/dl    Hematocrit 16.1 (L) 42.0 - 52.0 %    MCV 88.1 80.0 - 94.0 fL    MCH 29.2 27.0 - 31.0 pg    MCHC 33.2 33.0 - 37.0 gm/dl    RDW 09.6 04.5 - 40.9 %    Platelets 224 130 - 400 thou/mm3    MPV 9.6 7.4 - 10.4 fL    Seg Neutrophils 69.8 %    Lymphocytes 21.4 %    Monocytes 6.3 %    Eosinophils 2.1 %    Basophils 0.4 %    nRBC 0 /100 wbc    Segs Absolute 5.0 1.8 - 7.7 thou/mm3    Lymphocytes # 1.5 1.0 - 4.8 thou/mm3    Monocytes # 0.4 0.4 - 1.3 thou/mm3    Eosinophils # 0.1 0.0 - 0.4 thou/mm3    Basophils # 0.0 0.0 - 0.1 thou/mm3   Basic Metabolic Panel w/ Reflex to MG   Result Value Ref Range    Sodium 139 135 - 145 meq/L    Potassium reflex Magnesium 4.2 3.5 - 5.2 meq/L    Chloride 101 98 - 111 meq/L    CO2 26 23 - 33 meq/L    Glucose 165 (H) 70 - 108 mg/dL  BUN 14 7 - 22 mg/dL    CREATININE 1.1 0.4 - 1.2 mg/dL    Calcium 9.4 8.5 - 16.1 mg/dL   Troponin I   Result Value Ref Range    Troponin T < 0.010 ng/ml   Brain natriuretic peptide   Result Value Ref Range    Pro-BNP 51.8 0.0 - 900.0 pg/mL   Anion Gap   Result Value Ref Range    Anion Gap 12.0 8.0 - 16.0 meq/L   Glomerular Filtration Rate, Estimated   Result Value Ref Range    Est, Glom Filt Rate 68 (A) ml/min/1.72m2   Osmolality   Result Value Ref Range    Osmolality Calc 281.7 275.0 - 300 mOsmol/kg   EKG 12 lead   Result Value Ref Range    Ventricular Rate 79 BPM    Atrial Rate 79 BPM    P-R Interval 148 ms    QRS Duration 92 ms    Q-T Interval 368 ms    QTc Calculation (Bazett) 421 ms    P Axis 60 degrees    R Axis 31 degrees    T Axis 57 degrees   EKG 12 Lead   Result Value Ref Range    Ventricular Rate 76 BPM    Atrial Rate 76 BPM    P-R Interval 152 ms    QRS Duration 90 ms    Q-T Interval 372 ms    QTc Calculation (Bazett) 418 ms    P Axis 57 degrees    R Axis 32 degrees    T Axis 51  degrees       EMERGENCY DEPARTMENT COURSE:   Vitals:    Vitals:    05/06/16 0359 05/06/16 0444   BP: (!) 148/87 123/75   Pulse: 83 75   Resp: 18 18   Temp: 98.3 F (36.8 C)    SpO2: 98% 96%   Weight: 215 lb (97.5 kg)        Orders Placed This Encounter   Medications   . aspirin chewable tablet 324 mg   . nitroGLYCERIN 50 mg in dextrose 5% 250 mL infusion   . ondansetron (ZOFRAN) tablet 4 mg   . heparin (porcine) injection 4,000 Units   . heparin (porcine) injection 4,000 Units   . heparin (porcine) injection 2,000 Units   . heparin 25,000 units in dextrose 5% 250 mL infusion     Heart Score for chest pain patients  History: Highly Suspicious  ECG: Normal  Patient Age: > 45 and < 65 years  *Risk factors for Atherosclerotic disease: Coronary Artery Disease, Diabetes Mellitus, Hypercholesterolemia, Hypertension  Risk Factors: > 3 Risk factors or history of atherosclerotic disease*  Troponin: < 1X normal limit  Heart Score Total: 5    Patient was seen and evaluated emergency department.  History and physical were completed.  Diagnostic labs and imaging studies reviewed.  Workup negative for alternate diagnosis and no evidence of elevation of the troponin at this time.  The patient treated with IV nitroglycerin and aspirin and is now pain free.  I started him on low-dose heparin and recommended admission.  I contacted the hospitalist service and they will admit and assume further care and orders for patient at this time.    CRITICAL CARE:  There was a high probability of clinically significant/life threatening deterioration in this patient's condition which required my urgent intervention.  Total critical care time was 30 minutes.  This excludes any time for separately reportable procedures.  FINAL IMPRESSION      1. Chest pain with moderate risk of acute coronary syndrome          DISPOSITION/PLAN   DISPOSITION Admitted 05/06/2016 05:37:29 AM    PATIENT REFERRED TO:  Lizabeth Leyden, MD  [address] [place]  [address]  [phone]          [name], DO  [address]  [place] [address]  [phone]          Scribe:  Lennon Alstrom 05/06/16 4:13 AM Scribing for and in the presence of Dr. Lajean Silvius, M.D.    Signed by: Lennon Alstrom, Scribe, 05/06/16 5:54 AM    Provider:  I personally performed the services described in the documentation, reviewed and edited the documentation which was dictated to the scribe in my presence, and it accurately records my words and actions.    Dr. Lajean Silvius, M.D 05/06/16 5:54 AM        Lajean Silvius, MD  05/06/16 [id number]       Lajean Silvius, MD  05/06/16 [phone]

## 2016-05-06 NOTE — Progress Notes (Signed)
Inpatient Cardiac Rehabilitation Consult    Received consult for Phase II Cardiac Rehabilitation.  Cardiac Rehab education completed with patient.  Pt stated he is not interested in Rehab. Brochure given.

## 2016-05-06 NOTE — Consults (Signed)
Consult to Cardiology  Consult performed by: Rhona Leavens  Consult ordered by: Amalia Hailey          Unstable angina  CAD s/p CABG and stent at [facility]      Plan  Need cath  OMT    [id number]    Rhona Leavens, MD

## 2016-05-06 NOTE — Progress Notes (Signed)
[facility]  Sedation/Analgesia History & Physical      Pt Name: Micheal [name]  MRN: [id number]  Date of Birth: 02/06/1955  Provider Performing Procedure: Virgilio Frees MD, Emmie Niemann, [facility]  Primary Care Physician: [name], DO      PRE-PROCEDURE   DNR-CCA/DNR-CC Yes No      PLANNED PROCEDURE     Cath  PCI                Pacemaker/AICD  TEE             Cardioversion Peripheral angiography/PTA  Other:        Consent:   The indication, risks and benefits of the procedure and possible therapeutic consequences and alternatives were discussed with the patient.   The patient was given the opportunity to ask questions and to have them answered to his/her satisfaction. Risks of the procedure include but are not limited to the following: Bleeding, hematoma including retroperitoneal hematoma, infection, pain and discomfort, injury to the aorta and other blood vessels, rhythm disturbance, low blood pressure, myocardial infarction, stroke, kidney damage/failure, myocardial perforation, allergic reactions to sedatives/contrast material, loss of pulse/vascular compromise requiring surgery, aneurysm/pseudoaneurysm formation, possible loss of a limb/hand/leg, death. Alternatives to and omission of the suggested procedure were discussed. The patient had no further questions and wished to proceed; the consent form was signed.      Indications for the Procedure:   Unstable angina         MEDICAL HISTORY        Past Medical History:   Diagnosis Date   . CAD (coronary artery disease)    . Diabetes mellitus (HCC)    . Hyperlipidemia    . Hypertension          Past Surgical History:   Procedure Laterality Date   . CORONARY ARTERY BYPASS GRAFT     . ORTHOPEDIC SURGERY Left            No Known Allergies      MEDICATIONS   Coumadin Use Last 5 Days No Yes  Antiplatelet drug therapy use last 5 days  No Yes  Other anticoagulant use last 5 days  No Yes      No current facility-administered  medications on file prior to encounter.      Current Outpatient Prescriptions on File Prior to Encounter   Medication Sig Dispense Refill   . oxyCODONE-acetaminophen (PERCOCET) 10-325 MG per tablet Take 1 tablet by mouth every 8 hours as needed for Pain for up to 30 days. 90 tablet 0   . glimepiride (AMARYL) 4 MG tablet Take 1 tablet by mouth every morning 30 tablet 3   . fenofibrate (TRICOR) 145 MG tablet TAKE 1 TABLET BY MOUTH DAILY 30 tablet 5   . rosuvastatin (CRESTOR) 40 MG tablet TAKE 1 TABLET BY MOUTH EVERY MORNING 30 tablet 5   . esomeprazole (NEXIUM) 40 MG delayed release capsule TAKE 1 CAPSULE BY MOUTH EVERY MORNING BEFORE BREAKFAST 30 capsule 5   . clopidogrel (PLAVIX) 75 MG tablet TAKE 1 TABLET BY MOUTH DAILY 30 tablet 5   . metoprolol succinate (TOPROL XL) 100 MG extended release tablet TAKE 1 AND 1/2 TABLETS BY MOUTH DAILY 135 tablet 5   . lisinopril (PRINIVIL;ZESTRIL) 40 MG tablet TAKE 1 TABLET BY MOUTH DAILY 30 tablet 5   . metFORMIN (GLUCOPHAGE) 1000 MG tablet Take 1 tablet by mouth 2 times daily (with meals) 180 tablet 3   .  busPIRone (BUSPAR) 7.5 MG tablet Take 1 tablet by mouth 2 times daily 60 tablet 2   . hydrocortisone 2.5 % cream Apply to external ear BID PRN 1 Tube 2   . olopatadine (PATANOL) 0.1 % ophthalmic solution Place 1 drop into both eyes 2 times daily 1 Bottle 5   . fluticasone (FLONASE) 50 MCG/ACT nasal spray 2 sprays by Nasal route daily 1 Bottle 5   . Handicap Placard MISC by Does not apply route Expires 03/01/2020 1 each 0   . Multiple Vitamins-Minerals (CENTRUM SILVER ULTRA MENS) TABS Take 1 tablet by mouth daily     . nitroGLYCERIN (NITROSTAT) 0.4 MG SL tablet Place 0.4 mg under the tongue every 5 minutes as needed for Chest pain         Current Facility-Administered Medications   Medication Dose Route Frequency Provider Last Rate Last Dose   . nitroGLYCERIN 50 mg in dextrose 5% 250 mL infusion  10 mcg/min Intravenous Continuous Lajean Silvius, MD 3 mL/hr at 05/06/16 0445 10  mcg/min at 05/06/16 0445   . heparin (porcine) injection 4,000 Units  4,000 Units Intravenous PRN Lajean Silvius, MD       . heparin (porcine) injection 2,000 Units  2,000 Units Intravenous PRN Lajean Silvius, MD       . heparin 25,000 units in dextrose 5% 250 mL infusion  10 Units/kg/hr Intravenous Continuous Lajean Silvius, MD 9.7 mL/hr at 05/06/16 0751 10 Units/kg/hr at 05/06/16 0751   . sodium chloride flush 0.9 % injection 10 mL  10 mL Intravenous 2 times per day Amalia Hailey, MD       . sodium chloride flush 0.9 % injection 10 mL  10 mL Intravenous PRN Amalia Hailey, MD       . acetaminophen (TYLENOL) tablet 650 mg  650 mg Oral Q4H PRN Amalia Hailey, MD   650 mg at 05/06/16 1111   . magnesium hydroxide (MILK OF MAGNESIA) 400 MG/5ML suspension 30 mL  30 mL Oral Daily PRN Amalia Hailey, MD       . ondansetron [facility]) injection 4 mg  4 mg Intravenous Q6H PRN Amalia Hailey, MD       . 0.9 % sodium chloride infusion   Intravenous Continuous Amalia Hailey, MD       . magnesium sulfate 1 g in dextrose 5 % 100 mL IVPB  1 g Intravenous PRN Amalia Hailey, MD       . potassium chloride (KLOR-CON M) extended release tablet 40 mEq  40 mEq Oral PRN Amalia Hailey, MD        Or   . potassium chloride 20 MEQ/15ML (10%) oral solution 40 mEq  40 mEq Oral PRN Amalia Hailey, MD        Or   . potassium chloride 10 mEq/100 mL IVPB (Peripheral Line)  10 mEq Intravenous PRN Amalia Hailey, MD       . morphine injection 2 mg  2 mg Intravenous Q2H PRN Amalia Hailey, MD        Or   . morphine injection 4 mg  4 mg Intravenous Q2H PRN Amalia Hailey, MD       . Melene Muller ON 05/07/2016] aspirin chewable tablet 81 mg  81 mg Oral Daily Amalia Hailey, MD                 PHYSICAL:     BP 133/79   Pulse 78  Temp 98.8 F (37.1 C) (Oral)   Resp 16   Ht  (1.702 m)   Wt 213 lb 8 oz (96.8 kg)   SpO2 93%   BMI 33.44 kg/m     Heart:  Regular rate and rhythm  Other:    Lungs:  Clear     Other:    Abdomen: Soft    Other:    Mental Status: Alert & Oriented  Other:   Ext:                No edema       Other:         No results for input(s): CKTOTAL, CKMB, CKMBINDEX, TROPONINI in the last 72 hours.    Lab Results   Component Value Date    WBC 7.1 05/06/2016    RBC 4.47 05/06/2016    HGB 13.1 05/06/2016    HCT 39.4 05/06/2016    MCV 88.1 05/06/2016    MCH 29.2 05/06/2016    MCHC 33.2 05/06/2016    RDW 13.4 05/06/2016    PLT 224 05/06/2016    MPV 9.6 05/06/2016       Lab Results   Component Value Date    NA 139 05/06/2016    K 4.2 05/06/2016    CL 101 05/06/2016    CO2 26 05/06/2016    BUN 14 05/06/2016    LABALBU 4.6 09/03/2014    CREATININE 1.1 05/06/2016    CALCIUM 9.4 05/06/2016    LABGLOM 68 05/06/2016    GLUCOSE 165 05/06/2016       Lab Results   Component Value Date    ALKPHOS 30 09/03/2014    ALT 39 09/03/2014    AST 28 09/03/2014    PROT 8.3 09/03/2014    BILITOT 0.7 09/03/2014    LABALBU 4.6 09/03/2014       No results found for: MG    No components found for: Haskel Khan    Lab Results   Component Value Date    LABA1C 8.5 01/13/2016       Lab Results   Component Value Date    TRIG 82 09/03/2014    HDL 31 09/03/2014    LDLCALC 53 09/03/2014       Lab Results   Component Value Date    TSH 3.000 09/03/2014            SEDATION  Planned agent:[x] Midazolam Meperidine Sublimaze Morphine Diazepam  Other:         ASA Classification:  1 2 3 4 5  Class 1: A normal healthy patient  Class 2: Pt with mild to moderate systemic disease  Class 3: Severe systemic disease or disturbance  Class 4: Severe systemic disorders that are already life threatening.  Class 5: Moribund pt with little chances of survival, for more than 24 hours.    Mallampati I Airway Classification:   1 2 3 4      Pre-procedure diagnostic studies complete and results available.   Comment:    Previous sedation/anesthesia experiences assessed.   Comment:    The patient is an  appropriate candidate to undergo the planned procedure sedation and anesthesia. (Refer to nursing sedation/analgesia documentation record)  Formulation and discussion of sedation/procedure plan, risks, and expectations with patient and/or responsible adult completed.  Patient examined immediately prior to the procedure. (Refer to nursing sedation/analgesia documentation record)        Lois Slagel Verna Czech MD, FACC, FSCAI, FCCP  Electronically signed  05/06/2016 at 11:30 AM

## 2016-05-06 NOTE — Care Coordination-Inpatient (Signed)
05/06/16, 10:34 AM      Willow Ora       Admitted from: ED   05/06/2016/ [facility] day: 0   Location: 3B-28/028-A Reason for admit: ACS (acute coronary syndrome) ([place]) [I24.9]    Status: Inpatient  Admit order signed?: yes  PMH:  has a past medical history of CAD (coronary artery disease); Diabetes mellitus ([place]); Hyperlipidemia; and Hypertension.  Procedure: 4/13 Planning heart cath/Dr Rufina Falco  Pertinent abnormal Imaging: none  Medications:  Scheduled Meds:  ??? sodium chloride flush  10 mL Intravenous 2 times per day   ??? [START ON 05/07/2016] aspirin  81 mg Oral Daily     Continuous Infusions:  ??? nitroGLYCERIN 10 mcg/min (05/06/16 0445)   ??? heparin (porcine) 10 Units/kg/hr (05/06/16 0751)   ??? sodium chloride        Pertinent Info/Orders/Treatment Plan: Patient has a recent nuclear stress test recently, Telemetry, Heparin and Nitro IV cont, denies chest pain, monitor labs & vitals, cardiology consult,   Diet: Diet NPO Effective Now   DVT Prophylaxis: Heparin  Smoking status:  reports that he quit smoking about 7 years ago. His smoking use included Cigarettes. He has never used smokeless tobacco.   Influenza Vaccination Screening Completed: n/a  Pneumonia Vaccination Screening Completed: yes  Core measures: VTE  PCP: Kyra Leyland, DO  Readmission:   no  Risk Score:       Discharge Planning  Current Residence:  Private Residence  Living Arrangements:  Alone   Support Systems:  Spouse/Significant Other, Children  Current Services PTA:     Potential Assistance Needed:  N/A  Potential Assistance Purchasing Medications:  No  Does patient want to participate in local refill/ meds to beds program?  N/A  Type of Home Care Services:  None  Patient expects to be discharged to:  home  Expected Discharge date:  05/08/16  Follow Up Appointment: Best Day/ Time: Monday PM    Discharge Plan: Met with Shanon Brow; he lives home alone and drives and has no issues with obtainiing prescribed medications. Dr Rufina Falco here now; planning heart  cath later today.     Social Services Evaluation: no      05/06/16, 10:41 AM    Discharge plan discussed by Case Manager and Education officer, museum.  Discharge plan reviewed with patient/ family.  Patient/ family verbalize understanding of discharge plan and are in agreement with plan.  Understanding was demonstrated using the teach back method.       IMM Letter  IMM Letter given to Patient/Family/Significant other/Guardian/POA/by:: Horris Latino CM  IMM Letter date given:: 05/06/16  IMM Letter time given:: 2353

## 2016-05-06 NOTE — Op Note (Signed)
[facility]  Sedation/Analgesia Post Sedation Record        Pt Name: Micheal [name]  MRN: [id number]  Date of Birth: 08-16-1955  Procedure Performed By: Virgilio Frees  MD, Emmie Niemann, [facility]  Primary Care Physician: [name], DO        POST-PROCEDURE    Physicians/Assistants: Virgilio Frees MD, FACC, FSCAI, FCCP    Procedure Performed:  Left heart cath and PCI to LCx                                  Sedation/Anesthesia:  Local Anesthesia and IV Conscious Sedation with continuous O2 monitoring    Estimated Blood Loss: 10 cc     Specimens Removed:  None Other:      Disposition of Specimen:  Pathology Other        Complications:   None Immediate Other:       Post Procedure Diagnosis/Findings:  Coronary Artery Disease          Recommendations:  Medical treatment and review films.               Flavius Repsher Verna Czech MD, Emmie Niemann, FCCP  Electronically signed 05/06/2016 at 2:19 PM

## 2016-05-06 NOTE — ED Triage Notes (Signed)
Awoke with chest pain this morning states that he also had sweating this occurred two hours ago states that he didn't take nitro at home for the chest pain states that he took no asa for this either resp easy and unlabored ekg completed and given to provider

## 2016-05-06 NOTE — Progress Notes (Signed)
Nutrition Education    Type and Reason for Visit: Initial, Patient Education    Patient stated he had had diet education in the past, "when I had open heart surgery" but admits to not following any dietary recommendations prior to admit.  Infrequently checks his blood sugars as well.  Limited activity.      ?? Verbally reviewed following information with Patient and Spouse: Carb controlled, cardiac, weight reduction diet.  ?? Marketing executive provided.  ?? Contact name and number provided.  ?? Refer to Patient Education activity for more details.    Electronically signed by Zenovia Jarred, RD, LD on 05/06/16 at 3:40 PM    Contact Number: [phone]

## 2016-05-06 NOTE — Progress Notes (Signed)
Hospitalist Progress Note  [facility] CCU-STEPDOWN 3B       Patient: Micheal [name]  Unit/Bed: 3B-28/028-A  Date of Birth: 03-17-1955  MRN: [id number]  Acct: [id number]   Admitting Diagnosis: ACS (acute coronary syndrome) [facility]) [I24.9]  Admit Date:  05/06/2016  Hospital Day: 0    Subjective:    Patient is feeling better after cardiac cath today.H/o stable angina w/ sob.    Patient Seen, Chart, Labs, Radiology studies, and Consults reviewed.    Objective:   BP 133/79    Pulse 78    Temp 98.8 ??F (37.1 ??C) (Oral)    Resp 16    Ht  (1.702 m)    Wt 213 lb 8 oz (96.8 kg)    SpO2 93%    BMI 33.44 kg/m??   No intake or output data in the 24 hours ending 05/06/16 0820  Review of Systems   Constitutional: Negative for activity change, chills, fatigue and unexpected weight change.   HENT: Negative for congestion, drooling, facial swelling, mouth sores, postnasal drip, sinus pressure, sore throat, trouble swallowing and voice change.    Eyes: Negative for photophobia, redness and itching.   Respiratory: Negative for apnea, choking, shortness of breath, wheezing and stridor.    Cardiovascular: Negative for chest pain, palpitations and leg swelling.   Gastrointestinal: Negative for abdominal distention, anal bleeding, blood in stool, constipation, nausea and vomiting.   Endocrine: Negative for heat intolerance and polyphagia.   Genitourinary: Negative for decreased urine volume, difficulty urinating, enuresis, frequency, hematuria, penile pain, scrotal swelling and urgency.   Musculoskeletal: Negative for arthralgias, gait problem, myalgias and neck stiffness.   Skin: Negative for color change, rash and wound.   Allergic/Immunologic: Negative for food allergies.   Neurological: Negative for dizziness, tremors, speech difficulty, weakness, numbness and headaches.   Hematological: Negative for adenopathy. Does not bruise/bleed easily.   Psychiatric/Behavioral: Negative for agitation, confusion, dysphoric mood, self-injury, sleep  disturbance and suicidal ideas. The patient is not hyperactive.      Physical Exam   Constitutional: He is oriented to person, place, and time. He appears well-developed and well-nourished. No distress.   HENT:   Head: Normocephalic and atraumatic.   Right Ear: External ear normal.   Left Ear: External ear normal.   Nose: Nose normal.   Mouth/Throat: Oropharynx is clear and moist. No oropharyngeal exudate.   Eyes: Conjunctivae and EOM are normal. Pupils are equal, round, and reactive to light. Right eye exhibits no discharge. Left eye exhibits no discharge. No scleral icterus.   Neck: Normal range of motion. Neck supple. No JVD present. No tracheal deviation present. No thyromegaly present.   Cardiovascular: Normal rate, regular rhythm, normal heart sounds and intact distal pulses.  Exam reveals no gallop and no friction rub.    No murmur heard.  Pulmonary/Chest: Effort normal and breath sounds normal. No stridor. No respiratory distress. He has no wheezes. He has no rales. He exhibits no tenderness.   Abdominal: Soft. Bowel sounds are normal. He exhibits no distension and no mass. There is no tenderness. There is no rebound and no guarding.   Genitourinary: Rectal exam shows guaiac negative stool. No penile tenderness.   Musculoskeletal: Normal range of motion. He exhibits no edema, tenderness or deformity.   Lymphadenopathy:     He has no cervical adenopathy.   Neurological: He is alert and oriented to person, place, and time. He displays normal reflexes. No cranial nerve deficit. He exhibits normal muscle tone. Coordination normal.  Skin: Skin is warm and dry. No rash noted. He is not diaphoretic. No erythema. No pallor.   Psychiatric: He has a normal mood and affect. His behavior is normal. Judgment and thought content normal.   Nursing note and vitals reviewed.        Medications:   ??? sodium chloride flush  10 mL Intravenous 2 times per day   ??? [START ON 05/07/2016] aspirin  81 mg Oral Daily     Continuous  Infusions:  ??? nitroGLYCERIN 10 mcg/min (05/06/16 0445)   ??? heparin (porcine) 10 Units/kg/hr (05/06/16 0751)   ??? sodium chloride       PRN Meds:heparin (porcine), heparin (porcine), sodium chloride flush, acetaminophen, magnesium hydroxide, ondansetron, magnesium sulfate, potassium chloride **OR** potassium chloride **OR** potassium chloride, morphine **OR** morphine    Data:  CBC:   Recent Labs      05/06/16   0426   WBC  7.1   RBC  4.47*   HGB  13.1*   HCT  39.4*   MCV  88.1   RDW  13.4   PLT  224     BMP:   Recent Labs      05/06/16   0425   NA  139   K  4.2   CL  101   CO2  26   BUN  14   CREATININE  1.1     BNP: No results for input(s): BNP in the last 72 hours.  PT/INR: No results for input(s): PROTIME, INR in the last 72 hours.  APTT:   Recent Labs      05/06/16   0426   APTT  25.0     CARDIAC ENZYMES:   Recent Labs      05/06/16   0425  05/06/16   0600   TROPONINT  < 0.010  < 0.010     FASTING LIPID PANEL:  Lab Results   Component Value Date    CHOL 100 09/03/2014    HDL 31 09/03/2014    TRIG 82 09/03/2014     LIVER PROFILE: No results for input(s): AST, ALT, ALB, BILIDIR, BILITOT, ALKPHOS in the last 72 hours.   ABGs: No results found for: PH, PCO2, PO2, HCO3, O2SAT      MICROBIOLOGY & HISTOPATHOLOGY.   None.    ENDOSCOPE STUDIES.  None.    PROCEDURES.  S/P CARDIAC CATH- REPORT PENDING.    RADIOLOGY.  CXR-05/06/2016.  1.??No acute cardiopulmonary process.    LEXISCAN-09/08/2014.??Summary  ??Exercise EKG stress test is not suggestive for ischemia.  ??This Nuclear Medicine study was negative for ischemia.  ??There was evidence of an inferior wall fixed defect.    The defect was small ??in size and mild in severity.    This most likely represents an infarct.  ??  ECHO-05/02/2016.  ??Summary  ??Ejection fraction is visually estimated at 50%.  ??There was mild global hypokinesis of the left ventricle.  ??Mildly dilated left atrium.    ASSESSMENT AND  PLAN.    1.CARDIOVASCULAR.     CAD W/ UNSTABLE ANGINA-RESOLVED .S/P CARDIAC  CATH.     CAD W/ PRIOR CABG.     HTN-CONTROLLED.     HLD-LIPID PANEL.    2.ENDO.     T2 DM -SSI. A1C 8.5.       3.MUSCULOSKELETAL.     PENDING ELECTIVE BACK SURGERY.    4.DISPO.     PLANNED D/C TO HOME AM.      Electronically signed by Melvia Heaps  Carrie Mew, MD on 05/06/2016 at 8:20 AM    Rounding Hospitalist

## 2016-05-06 NOTE — Progress Notes (Signed)
Pharmacy Medication History Note      List of current medications patient is taking is complete.    Source of information: Patient and OARRS     Changes made to medication list:  Medications removed (include reason, ex. therapy complete or physician discontinued):  ?? Ranolazine ER 500 mg tablet PO BID stopped d/t patient feeling that it was not necessary since chest pain had stopped.  ?? Citalopram 40 mg tablet PO daily discontinued by physician  ?? Welchol 3.75g packet PO daily with lunch discontinued by physician    Medications added/doses adjusted:  ?? Lorazepam 0.5 mg tablet PO BID for anxiety    Other notes (ex. Recent course of antibiotics, Coumadin dosing):  ?? Patient stated they typically take a MVI daily, but had been told to stop due to possible back surgery.   ?? Patient is taking Percocet Q8H currently as the pain is better controlled that way until he can consult his physician for his back again.   ?? Denies use of other OTC or herbal medications.      Allergies reviewed.      Electronically signed by Lajean Manes on 05/06/2016 at 11:06 AM

## 2016-05-06 NOTE — H&P (Signed)
History & Physical        Patient:  Micheal [name]  Date of Birth: 21-May-1955    MRN: [id number]     Acct: [id number]    PCP: [name], DO    Date of Admission: 05/06/2016    Date of Service: Pt seen/examined on 05/06/16 and Admitted to Inpatient with expected LOS greater than two midnights due to medical therapy.     Chief Complaint:  Chest pain      History Of Present Illness:    61 y.o. male who presented to [facility] for evaluation of ant localized chest pain.Sudden in onset,mod in severity,persistent in time,aggravated by exertion,associated with sob and sweating and nausea.He reports that this began at approximately at midnght. Patient denies taking ASA or Nitro.Feeling better with NGT drip. Patient reports his cardiologist is Dr. Gabrielle Dare. Patient reports last week he had a stress test that showed inferior ischemia. Patient is on Aspirin and Plavix. Patient denies any dizziness. Patient has history of CAD, diabetes, and CABG  No orthopnea or pnd.No cough or fever.  Past Medical History:          Diagnosis Date   ??? CAD (coronary artery disease)    ??? Diabetes mellitus (HCC)    ??? Hyperlipidemia    ??? Hypertension        Past Surgical History:          Procedure Laterality Date   ??? CORONARY ARTERY BYPASS GRAFT     ??? ORTHOPEDIC SURGERY Left        Medications Prior to Admission:      Prior to Admission medications    Medication Sig Start Date End Date Taking? Authorizing Provider   oxyCODONE-acetaminophen (PERCOCET) 10-325 MG per tablet Take 1 tablet by mouth every 8 hours as needed for Pain for up to 30 days. 04/20/16 05/20/16 Yes Stark Klein, DO   glimepiride (AMARYL) 4 MG tablet Take 1 tablet by mouth every morning 04/14/16  Yes Mark D Kahle, DO   rosuvastatin (CRESTOR) 40 MG tablet TAKE 1 TABLET BY MOUTH EVERY MORNING 04/11/16  Yes [name], DO   esomeprazole (NEXIUM) 40 MG delayed release capsule TAKE 1 CAPSULE BY MOUTH EVERY MORNING BEFORE BREAKFAST 04/11/16  Yes [name], DO    clopidogrel (PLAVIX) 75 MG tablet TAKE 1 TABLET BY MOUTH DAILY 04/11/16  Yes [name], DO   metoprolol succinate (TOPROL XL) 100 MG extended release tablet TAKE 1 AND 1/2 TABLETS BY MOUTH DAILY 04/11/16  Yes Mark D Kahle, DO   lisinopril (PRINIVIL;ZESTRIL) 40 MG tablet TAKE 1 TABLET BY MOUTH DAILY 04/11/16  Yes [name], DO   metFORMIN (GLUCOPHAGE) 1000 MG tablet Take 1 tablet by mouth 2 times daily (with meals) 04/11/16  Yes [name], DO   citalopram (CELEXA) 40 MG tablet Take 1 tablet by mouth daily 03/03/16  Yes [name], DO   busPIRone (BUSPAR) 7.5 MG tablet Take 1 tablet by mouth 2 times daily 02/16/16  Yes [name], DO   hydrocortisone 2.5 % cream Apply to external ear BID PRN 01/13/16  Yes [name], DO   olopatadine (PATANOL) 0.1 % ophthalmic solution Place 1 drop into both eyes 2 times daily 08/24/15  Yes [name], DO   fluticasone Aleda Grana) 50 MCG/ACT nasal spray 2 sprays by Nasal route daily 08/24/15  Yes [name], DO   Handicap Placard MISC by Does not apply  route Expires 03/01/2020 03/02/15  Yes [name], DO   Multiple Vitamins-Minerals (CENTRUM SILVER ULTRA MENS) TABS Take 1 tablet by mouth daily   Yes Historical Provider, MD   nitroGLYCERIN (NITROSTAT) 0.4 MG SL tablet Place 0.4 mg under the tongue every 5 minutes as needed for Chest pain   Yes Historical Provider, MD   fenofibrate (TRICOR) 145 MG tablet TAKE 1 TABLET BY MOUTH DAILY 04/11/16   [name], DO   ranolazine (RANEXA) 500 MG extended release tablet Take 1 tablet by mouth 2 times daily 01/21/15   Modena Nunnery, DO   colesevelam [facility]) 3.75 G PACK Take 1 packet by mouth Daily with lunch 09/12/14   Eduard Roux, MD       Allergies:  Patient has no known allergies.    Social History:      The patient currently lives at home    TOBACCO:   reports that he quit smoking about 7 years ago. His smoking use included Cigarettes. He has never used smokeless tobacco.  ETOH:   reports that he drinks about 7.2 oz of alcohol  per week .      Family History:      Reviewed in detail and negative for DM, CAD, Cancer, CVA. Positive as follows:        Problem Relation Age of Onset   ??? Cancer Mother    ??? Heart Disease Brother    ??? Stroke Brother    ??? Diabetes Brother    ??? Early Death Brother    ??? High Blood Pressure Brother    ??? High Cholesterol Brother    ??? Mental Retardation Sister        Diet:  Diet NPO Effective Now    REVIEW OF SYSTEMS:   Pertinent positives as noted in the HPI. All other systems reviewed and negative.    PHYSICAL EXAM:    BP 123/75    Pulse 75    Temp 98.3 ??F (36.8 ??C)    Resp 18    Wt 215 lb (97.5 kg)    SpO2 96%    BMI 33.67 kg/m??     General appearance:  No apparent distress, appears stated age and cooperative.  HEENT:  Normal cephalic, atraumatic without obvious deformity. Pupils equal, round, and reactive to light.  Extra ocular muscles intact. Conjunctivae/corneas clear.  Neck: Supple, with full range of motion. No jugular venous distention. Trachea midline.  Respiratory:  Normal respiratory effort. Clear to auscultation, bilaterally without Rales/Wheezes/Rhonchi.  Cardiovascular:  Regular rate and rhythm with normal S1/S2 without murmurs, rubs or gallops.  Abdomen: Soft, non-tender, non-distended with normal bowel sounds.  Musculoskeletal:  No clubbing, cyanosis or edema bilaterally.  Full range of motion without deformity.  Skin: Skin color, texture, turgor normal.  No rashes or lesions.  Neurologic:  Neurovascularly intact without any focal sensory/motor deficits. Cranial nerves: II-XII intact, grossly non-focal.  Psychiatric:  Alert and oriented, thought content appropriate, normal insight  Capillary Refill: Brisk,< 3 seconds   Peripheral Pulses: +2 palpable, equal bilaterally       Labs:     Recent Labs      05/06/16   0426   WBC  7.1   HGB  13.1*   HCT  39.4*   PLT  224     Recent Labs      05/06/16   0425   NA  139   K  4.2   CL  101   CO2  26   BUN  14   CREATININE  1.1   CALCIUM  9.4     No results for  input(s): AST, ALT, BILIDIR, BILITOT, ALKPHOS in the last 72 hours.  No results for input(s): INR in the last 72 hours.  No results for input(s): CKTOTAL, TROPONINI in the last 72 hours.    Urinalysis:      Lab Results   Component Value Date    BLOODU N 07/15/2015    SPECGRAV >=1.030 07/15/2015    GLUCOSEU N 07/15/2015       Radiology:     CXR: I have reviewed the CXR with the following interpretation:   EKG:  I have reviewed the EKG.    XR CHEST PORTABLE   Final Result   1.  No acute cardiopulmonary process.               **This report has been created using voice recognition software. It may contain minor errors which are inherent in voice recognition technology.**      Final report electronically signed by Dr. Earlyne Iba on 05/06/2016 4:40 AM               DVT prophylaxis:  Lovenox                                  SCDs                                  SQ Heparin                                  Encourage ambulation            Already on Anticoagulation    Code Status: Full Code      PT/OT Eval Status:     Disposition:     Home        TCU        Rehab        Psych        SNF        Long Term Care Facility        Other-    ASSESSMENT:  presented with acute chest pain /Unstable Angina/ACS/snd with recent abn stress  Test along with CAD hx.CABG  Active Hospital Problems    Diagnosis Date Noted   ??? Coronary artery disease of native heart with stable angina pectoris (HCC) [I25.118] 08/13/2014     Priority: High   ??? ACS (acute coronary syndrome) (HCC) [I24.9] 05/06/2016     Priority: Medium   ??? Essential hypertension [I10] 08/13/2014     Priority: Medium   ??? Hx of CABG [Z95.1] 08/13/2014     Priority: Medium   ??? Type 2 diabetes mellitus without complication, without long-term current use of insulin (HCC) [E11.9] 08/13/2014     Priority: Medium       PLAN:    1. Inpatient tele  2. Iv heparin and NTG/asa and plavix -took Both before he came in in AM  3. Cards consult  4. NPO until seen by  cards  5. insulin        Thank you [name], DO for the opportunity to be involved in this patient's care.    Electronically signed by Hillery Aldo  Casimiro Needle, MD on 05/06/2016 at 5:39 AM

## 2016-05-06 NOTE — Consults (Signed)
[facility]                      [address] [place] [address]                                   CONSULTATION    PATIENT NAME: Micheal [name], [name]                      DOB:        March 16, 1955  MED REC NO:   [id number]                           ROOM:       0028  ACCOUNT NO:   [id number]                           ADMIT DATE: 05/06/2016  PROVIDER:     Peri Maris. Cornelious Bryant, M.D.    CONSULT DATE:  05/06/2016    REASON FOR CONSULTATION:  Recurrent chest pain.    PRIMARY CARDIOLOGIST:  Dr. Earnest Rosier    HISTORY OF PRESENT ILLNESS:  This is a 61 year old Hispanic gentleman who  was in usual state of health until yesterday evening, and yesterday he  started to have sudden onset of chest pain after he moved material into his  apartment into his living place.  The chest pain is retrosternal pressure  and nonradiating associated with shortness of breath, diaphoresis and  feeling of _____ anxious and nauseous, and the patient has another  congestion, and denied taking any aspirin or nitroglycerin, and he  presented with persistent chest pain and he remained at home rested, but  the chest remained persistent, and so came to the emergency room, started  on nitroglycerin drip after that the chest pain resolved.  The patient gave  history of chest pain on and off for a while for several months on exertion  and gets better with rest, and this time it was persistent even when he  rested, and so he decided to come to the emergency room.    He has been seen by Dr. Earnest Rosier, and stress test was done, and it was  abnormal, and so was awaiting to be reevaluated for further possibility of  cardiac catheterization.    Currently, chest pain free, on nitroglycerin drip.    REVIEW OF SYSTEMS:  Negative except the above-mentioned ones.    PAST MEDICAL HISTORY:  History of coronary artery disease status post  previous bypass and stent subsequently, diabetes, hypertension,  hyperlipidemia.    PAST SURGICAL HISTORY:   Orthopedic surgery, coronary artery bypass, the  patient said five-vessel bypass.    FAMILY HISTORY:  Positive for heart disease, stroke, diabetes,  hypertension, hypercholesterolemia.    SOCIAL HISTORY:  Former smoker, quit in 2010.  Occasional alcohol  consumption.  No drug use.    ALLERGIES:  No known drug allergy.    HOME MEDICATION:  Prior to admission include the following:  BuSpar,  citalopram, Plavix, Welchol, Nexium, Tricor, Flonase, Amaryl, Prinivil,  Glucophage, Toprol, Centrum, nitroglycerin sublingual, Patanol, Percocet,  Ranexa, and Crestor.    PHYSICAL EXAMINATION:  GENERAL:  Looks sick, in no form of distress.  VITAL SIGNS:  Blood pressure 133/79, pulse rate 70, respiratory rate 16,  temperature 98.8.  HEENT:  Pink conjunctivae.  Nonicteric sclerae.  Pupils are equal and  reactive bilaterally to light and accommodation.  Extraocular muscles  intact.  NECK:   No lymphadenopathy, no goiter, no JVD.  CHEST:   Clear to auscultation and percussion.  CARDIOVASCULAR:  Arteries are felt; carotid, femoral and pedal.  No bruits.  S1, S2 well heard.  No murmur or gallop rhythm.  ABDOMEN:   Soft, nontender, nondistended.  Bowel sounds are positive.  GENITAL:   No CVA or flank tenderness.  LOCOMOTOR:   No cyanosis, clubbing or muscle or joint tenderness.  SKIN:   No rash or pallor.  CNS:  Alert, awake and oriented to time, person and place.  Cranial nerves  intact.  Sensation is intact to light touch and pain.  Motor, normal muscle  strength and tone.    Appropriate mood and affect.    LABORATORY DATA/WORKUP:  Troponin less than 0.01 x2.  Blood chemistry,  sodium 139, potassium 4.2, BUN is 14, creatinine 1.1.  Hematology, white  blood cells 7.1, hemoglobin 13, platelets 224.  Echocardiogram done in  04/2016 ejection fraction 50%, Lexiscan nuclear stress test 04/2016 showed  mild to moderate inferior ischemia cannot be excluded.  EKG done on this  admission revealed sinus rhythm with no acute EKG abnormality,  basically  normal EKG.    ASSESSMENT:  1.  Unstable angina with recurrent chest pain on exertion for several  months, and it worsened recently, and now responded to nitroglycerin drip,  and is doing well on nitroglycerin drip.  2.  Coronary artery disease status post previous bypass.  Apparently, the  patient stated five-vessel bypass and subsequently states at [facility] in  [place].  3.  Hypertension.  4.  Hyperlipidemia.  5.  Diabetes.  6.  Abnormal nuclear stress test.    PLAN/RECOMMENDATIONS:  1.  At this level in view of his recurrent chest pain on exertion and no  myocardial infarction noted, but in view of recurrent chest pain, the  patient needs cardiac catheterization and optimization of the medical  therapy.  Risks and benefits of cardiac catheterization including but not  limited to bleeding, myocardial infarction, stroke, death, allergy, kidney  problem, and emergency surgery have been informed, risk of _____ 1 in 3000  range, bleeding 1% and the patient verbalized understanding and agreed with  the plan of care.  2.  Optimize the medical therapy for underlying coronary artery disease,  and based on the course, we will gauge further care.  Case also has been  discussed with Dr. Earnest Rosier.    Thank you for allowing [place] to participate in the care of this patient.  I  will follow up with you.        Caydence Koenig W. Cornelious Bryant, M.D.    D: 05/06/2016 10:10:54       T: 05/06/2016 10:13:06     [email or web address]  Job#: [id number]     Doc#: [id number]    CC:

## 2016-05-06 NOTE — Other (Signed)
1420: Angiomax gtt infused and complete.

## 2016-05-06 NOTE — Progress Notes (Signed)
Patient arrived per cart to 3B28. Heart monitor applied and vitals taken.  Admission paperwork completed.  Explained to patient that [facility] is not responsible for any lost or stolen items.  Patient verbalized understanding. Oriented to room and use of call light and bed controls.  Patient denies pain or needs. No signs of distress noted.  Bed locked & in low position, side-rails up x2.  Call light in reach.  Reminded patient to call nurse if any needs arise.

## 2016-05-06 NOTE — Procedures (Signed)
EKG was handed to Amanda RN.

## 2016-05-07 LAB — LIPID PANEL
Cholesterol, Total: 125 mg/dL (ref 100–199)
HDL: 27 mg/dL
LDL Calculated: 77 mg/dL
Triglycerides: 103 mg/dL (ref 0–199)

## 2016-05-07 LAB — POCT GLUCOSE
POC Glucose: 216 mg/dl — ABNORMAL HIGH (ref 70–108)
POC Glucose: 139 mg/dl — ABNORMAL HIGH (ref 70–108)

## 2016-05-07 MED ORDER — ATORVASTATIN CALCIUM 80 MG PO TABS
80 MG | ORAL_TABLET | Freq: Every evening | ORAL | 3 refills | Status: DC
Start: 2016-05-07 — End: 2016-08-22

## 2016-05-07 MED ORDER — ISOSORBIDE MONONITRATE ER 30 MG PO TB24
30 MG | ORAL_TABLET | Freq: Every day | ORAL | 3 refills | Status: DC
Start: 2016-05-07 — End: 2016-08-22

## 2016-05-07 MED ORDER — TICAGRELOR 90 MG PO TABS
90 MG | ORAL_TABLET | Freq: Two times a day (BID) | ORAL | 3 refills | Status: DC
Start: 2016-05-07 — End: 2016-08-22

## 2016-05-07 MED ORDER — METOPROLOL SUCCINATE ER 25 MG PO TB24
25 MG | ORAL_TABLET | Freq: Every day | ORAL | 3 refills | Status: DC
Start: 2016-05-07 — End: 2016-08-22

## 2016-05-07 MED ORDER — ASPIRIN 81 MG PO CHEW
81 MG | ORAL_TABLET | Freq: Every day | ORAL | 0 refills | Status: DC
Start: 2016-05-07 — End: 2017-12-19

## 2016-05-07 MED FILL — OXYCODONE-ACETAMINOPHEN 10-325 MG PO TABS: 10-325 MG | ORAL | Qty: 1

## 2016-05-07 MED FILL — ASPIRIN 81 MG PO CHEW: 81 MG | ORAL | Qty: 1

## 2016-05-07 MED FILL — LORAZEPAM 0.5 MG PO TABS: 0.5 MG | ORAL | Qty: 1

## 2016-05-07 NOTE — Progress Notes (Signed)
Cardiology Progress Note      Patient:  Micheal [name]  Date of Birth: Feb 14, 1955  MRN: [id number]   Acct: [id number]  Admit Date:  05/06/2016  Primary Cardiologist: Gabrielle Dare  Seen by dr. Cornelious Bryant    Per prior note-  REASON FOR CONSULTATION:  Recurrent chest pain.  ??  PRIMARY CARDIOLOGIST:  Dr. Earnest Rosier  ??  HISTORY OF PRESENT ILLNESS:  This is a 61 year old Hispanic gentleman who  was in usual state of health until yesterday evening, and yesterday he  started to have sudden onset of chest pain after he moved material into his  apartment into his living place.  The chest pain is retrosternal pressure  and nonradiating associated with shortness of breath, diaphoresis and  feeling of _____ anxious and nauseous, and the patient has another  congestion, and denied taking any aspirin or nitroglycerin, and he  presented with persistent chest pain and he remained at home rested, but  the chest remained persistent, and so came to the emergency room, started  on nitroglycerin drip after that the chest pain resolved.  The patient gave  history of chest pain on and off for a while for several months on exertion  and gets better with rest, and this time it was persistent even when he  rested, and so he decided to come to the emergency room.  ??  He has been seen by Dr. Earnest Rosier, and stress test was done, and it was  abnormal, and so was awaiting to be reevaluated for further possibility of  cardiac catheterization.      Pt is s\\p cath w/ ??Left circumflex artery has a stent from ostium to mid segment and has 90%  ??in-stent stenosis at the ostial segment.- balloon angioplasty done DT back surgery soon      Subjective (Events in last 24 hours):     Pt with c/o panic attacks overnight     VSS  Tele - SR no ectopy    No chest pains or SOB  RT groin - dressing dry and intact   PPP- no ecchymosis or hematoma         Objective:   BP 125/60    Pulse 64    Temp 98.9 ??F (37.2 ??C) (Oral)    Resp 18    Ht  (1.702 m)    Wt 206 lb (93.4 kg)     SpO2 96%    BMI 32.26 kg/m??        TELEMETRY: SR    Physical Exam:  General Appearance: alert and oriented to person, place and time, in no acute distress  Cardiovascular: normal rate, regular rhythm, normal S1 and S2, no murmurs, rubs, clicks, or gallops, distal pulses intact  Pulmonary/Chest: clear to auscultation bilaterally- no wheezes, rales or rhonchi, normal air movement, no respiratory distress  Abdomen: soft, non-tender, non-distended, normal bowel sounds, no masses Extremities: no cyanosis, clubbing or edema, pulses present   Skin: warm and dry, no rash or erythema  Head: normocephalic and atraumatic  Musculoskeletal: normal range of motion, no joint swelling, deformity or tenderness  Neurological: alert, oriented, normal speech, no focal findings or movement disorder noted    Medications:   ??? sodium chloride flush  10 mL Intravenous 2 times per day   ??? aspirin  81 mg Oral Daily   ??? multivitamin  1 tablet Oral Daily   ??? busPIRone  7.5 mg Oral BID   ??? pantoprazole  40 mg Oral QAM AC   ???  metoprolol succinate  25 mg Oral Daily   ??? lisinopril  40 mg Oral Daily   ??? glimepiride  4 mg Oral QAM   ??? LORazepam  0.5 mg Oral BID   ??? ticagrelor  90 mg Oral BID   ??? isosorbide mononitrate  30 mg Oral Daily   ??? atorvastatin  80 mg Oral Nightly     ??? nitroGLYCERIN Stopped (05/06/16 2147)   ??? sodium chloride Stopped (05/07/16 0226)       sodium chloride flush 10 mL PRN   magnesium hydroxide 30 mL Daily PRN   ondansetron 4 mg Q6H PRN   magnesium sulfate 1 g PRN   potassium chloride 40 mEq PRN   Or     potassium chloride 40 mEq PRN   Or     potassium chloride 10 mEq PRN   morphine 2 mg Q2H PRN   Or     morphine 4 mg Q2H PRN   acetaminophen 650 mg Q4H PRN   nitroGLYCERIN 0.4 mg Q5 Min PRN   oxyCODONE-acetaminophen 1 tablet Q8H PRN       Diagnostics:  EKG:  06-May-2016 14:41:41 [facility] ROUTINE RETRIEVAL  Normal sinus rhythm  Incomplete right bundle branch block  Borderline ECG  When compared with ECG of  06-May-2016 03:55,  No significant change was found  Confirmed by ABDELBAKI, ZOHEIR ([id number]) on 05/06/2016 7:11:25 PM    Echo:   ??Electronically signed by Camille Bal MD (Interpreting  ??physician) on 05/02/2016 at 04:07 PM  ??----------------------------------------------------------------  ??  ??Findings  ??  ??Mitral Valve  ??Trace mitral regurgitation is present.  ??  ??Aortic Valve  ??The aortic valve was trileaflet with normal thickness and cuspal  ??separation. DOPPLER: Transaortic velocity was within the normal range with  ??no evidence of aortic stenosis. There was no evidence of aortic  ??regurgitation.  ??  ??Tricuspid Valve  ??Tricuspid valve was not well visualized.  ??Trivial tricuspid regurgitation visualized.  ??  ??Pulmonic Valve  ??The pulmonic valve was not well visualized .  ??Trivial pulmonic regurgitation visualized.  ??  ??Left Atrium  ??Mildly dilated left atrium.  ??  ??Left Ventricle  ??Ejection fraction is visually estimated at 50%.  ??There was mild global hypokinesis of the left ventricle.  ??  ??Right Atrium  ??Right atrial size was normal.  ??  ??Right Ventricle  ??The right ventricular size was normal with normal systolic function and  ??wall thickness.  ??  ??Pericardial Effusion  ??The pericardium was normal in appearance with no evidence of a pericardial  ??effusion.  ??  ??Pleural Effusion  ??No evidence of pleural effusion.  ??  ??Aorta / Great Vessels  ??-Aortic root dimension within normal limits.  ??-The Pulmonary artery is within normal limits.  ??-IVC size is within normal limits with normal respiratory phasic changes.  ??    Stress:   Summary  ??This Nuclear Medicine study was abnormal.  ??possible inferior ischemia as above  ??borderline EF  ??  ??Recommendation  ??Clinical correlation is recommended.  ??  ??Signatures  ??  ??----------------------------------------------------------------  ??Electronically signed by Camille Bal MD (Interpreting  ??Cardiologist) on 05/02/2016    Left Heart Cath:   Procedure  Summary  ??  ??Left main artery has mild diffuse disease.  ??  ??Right coronary artery has mild diffuse disease.  ??  ??Left anterior descending artery is totally occluded after the mid segment  ??and has a LIMA to LAD which is widely open.  ??  ??Left circumflex artery has  a stent from ostium to mid segment and has 90%  ??in-stent stenosis at the ostial segment.  ??  ??Normal LVEF with ejection fraction of 60%.  ??  ??All other vein grafts are closed as per previous report.  ??  ??Recommendations  ??  ??Complex case.  ??  ??Patient has significant stenosis of the ostial left circumflex artery  ??stent.  ??In normal circumstances, I would do balloon angioplasty or cutting  ??angioplasty followed by stent insertion.  ??  ??He is scheduled for back surgery for possible tumor.  ??  ??If I inserted a stent and then I would not be able to take him off  ??antiplatelets for at least a year and it will delay surgery.  ??  ??I decided to do balloon angioplasty only and I had excellent result.  ??Continue dual antiplatelet therapy for at least a month and reassess.  ??  ??This patient follows with Dr. Baki Gabrielle Darend he was notified.  ??  ??Estimated Blood Loss:5 ml.  ??  ??Complications:No complications.  ??  ??Signatures  ??  ??----------------------------------------------------------------  ??Electronically signed by Radene Journey MD (Performing  ??Physician) on 05/06/2016    Lab Data:    Cardiac Enzymes:  No results for input(s): CKTOTAL, CKMB, CKMBINDEX, TROPONINI in the last 72 hours.    CBC:   Lab Results   Component Value Date    WBC 7.1 05/06/2016    RBC 4.47 05/06/2016    HGB 13.1 05/06/2016    HCT 39.4 05/06/2016    PLT 224 05/06/2016       CMP:  Lab Results   Component Value Date    NA 139 05/06/2016    K 4.2 05/06/2016    CL 101 05/06/2016    CO2 26 05/06/2016    BUN 14 05/06/2016    CREATININE 1.1 05/06/2016    LABGLOM 68 05/06/2016    GLUCOSE 165 05/06/2016    CALCIUM 9.4 05/06/2016       Hepatic Function Panel:  Lab Results   Component Value Date     ALKPHOS 30 09/03/2014    ALT 39 09/03/2014    AST 28 09/03/2014    PROT 8.3 09/03/2014    BILITOT 0.7 09/03/2014    LABALBU 4.6 09/03/2014       Magnesium:  No results found for: MG    PT/INR:  No results found for: PROTIME, INR    HgBA1c:    Lab Results   Component Value Date    LABA1C 8.5 01/13/2016       FLP:  Lab Results   Component Value Date    TRIG 103 05/07/2016    HDL 27 05/07/2016    LDLCALC 77 05/07/2016       TSH:    Lab Results   Component Value Date    TSH 3.000 09/03/2014         Assessment:    [place]  Abnormal stress testing as OP    s\\p cath w/ PTBA to ISR LCX 05/06/2016    CABG  HTN  HLP  DM II    For spinal surgery soon at [facility]      Plan:      Ok for DC from cardio  Ok for surgery     He needs to follow back after surgery for PCI of LCX    Cardiac Rehab: Yes    Follow-up visits:   Baki 3-4 weeks - post PTBA      Discharge Medications for PCI/MI (performed  or attempted):   ?? ASA: yes  ?? Statin: yes  ?? P2Y12 Inhibitor: yes  ?? Beta Blocker: yes  ?? Nitro SL: yes      Discharge Medications for ICD, Cardiomyopathy, CHF:  ?? Beta Blocker: yes  ?? ACE Inhibitor/ARB: yes        Electronically signed by Michae Kava, CNP on 05/07/2016 at 11:12 AM

## 2016-05-07 NOTE — Plan of Care (Signed)
Problem: Tissue Perfusion - Peripheral, Altered:  Goal: Absence of hematoma at arterial access site  Absence of hematoma at arterial access site  Outcome: Ongoing  No hematoma in right groin site. Patient did have oozing at the beginning of the shift, but site redressed and oozing stopped. Will continue to monitor.    Comments: Care plan reviewed with patient.  Patient verbalized understanding of the plan of care and contribute to goal setting.

## 2016-05-07 NOTE — Plan of Care (Signed)
Problem: Discharge Planning:  Goal: Discharged to appropriate level of care  Discharged to appropriate level of care   Outcome: Ongoing  Patient plans to be discharged home today. Appropriate for his level of care.     Problem: Cardiac Output - Decreased:  Goal: Hemodynamic stability will improve  Hemodynamic stability will improve   Outcome: Ongoing  Patient's vitals stable. Patient had cath performed 4/13. Patient's grafts were down. Left circumflex had the balloon angioplasty. Patient started on Brillinta. Will continue to monitor with cardiac telemetry.     Problem: Serum Glucose Level - Abnormal:  Goal: Ability to maintain appropriate glucose levels will improve  Ability to maintain appropriate glucose levels will improve   Outcome: Ongoing  Patient is a diabetic and take oral medications. Blood sugars stable. Will continue to monitor.     Problem: Pain:  Goal: Pain level will decrease  Pain level will decrease   Outcome: Ongoing  Patient complaining of back pain. Percocet being given to help control patient's pain. Will continue to monitor and treat pain accordingly.    Problem: Tissue Perfusion - Cardiopulmonary, Altered:  Goal: Absence of angina  Absence of angina   Outcome: Ongoing  Patient has not complained of any angina during this shift. Will continue to monitor.  Goal: Circulatory function within specified parameters  Circulatory function within specified parameters   Outcome: Ongoing  All pulses palpable. Extremities warm to touch. No numbness or tingling. Will continue to monitor.    Comments: Care plan reviewed with patient.  Patient verbalize understanding of the plan of care and contribute to goal setting.

## 2016-05-07 NOTE — Progress Notes (Signed)
Patient complaining that he keeps waking up with shortness of breath and is requesting to wear a little bit of oxygen for comfort. Nasal cannula applied at 2L.

## 2016-05-07 NOTE — Discharge Summary (Signed)
Hospital Medicine Discharge Summary      Patient Identification:   Micheal [name]   DOB: 06-Feb-1955  MRN: [id number]   Account: [id number]      Patient's PCP: Micheal [name], Micheal [name]    Admit Date: 05/06/2016     Discharge Date:   05/07/16      Admitting Physician: Amalia Hailey, MD     Discharge Physician: Sissy Hoff, MD     Discharge Diagnoses:    Active Hospital Problems    Diagnosis Date Noted   ??? 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 S/P cardiac cath [Z98.890] 05/07/2016   ??? Class 1 obesity in adult [E66.9] 05/07/2016   ??? Coronary artery disease of native heart with stable angina pectoris (HCC) [I25.118] 08/13/2014   ??? Essential hypertension [I10] 08/13/2014   ??? Hx of CABG [Z95.1] 08/13/2014   ??? Type 2 diabetes mellitus without complication, without long-term current use of insulin (HCC) [E11.9] 08/13/2014       The patient was seen and examined on day of discharge and this discharge summary is in conjunction with any daily progress note from day of discharge.    Hospital Course:   Micheal [name] is a 61 y.o. male admitted to [facility] on 05/06/2016 for recurrent chest d/t unstable angina w/ an abnormal OP cardiac stress test. Resolved Unstable angina after cardiac cat w/ percutaneous balloon angioplasty of the left circumflex artery yesterday -05/06/2016.No complications.    Patient scheduled for elective back at [facility].   Need PCI of the LCX after back surgery.    Stable for d/c to home today.    Exam:     Vitals:  Vitals:    05/06/16 2322 05/07/16 0343 05/07/16 0745 05/07/16 1122   BP: (!) 101/49 (!) 110/57 125/60 103/64   Pulse: 63 68 64 75   Resp: Temp: 97.5 ??F (36.4 ??C) 97.9 ??F (36.6 ??C) 98.9 ??F (37.2 ??C) 98.2 ??F (36.8 ??C)   TempSrc: Oral Oral Oral Oral   SpO2: 94% 95% 96% 93%   Weight:  206 lb (93.4 kg)     Height:         Weight: Weight: 206 lb (93.4 kg)     24 hour intake/output:    Intake/Output Summary (Last 24 hours) at  05/07/16 1244  Last data filed at 05/07/16 0448   Gross per 24 hour   Intake          1323.95 ml   Output             1475 ml   Net          -151.05 ml       General appearance:  No apparent distress, appears stated age and cooperative.    Respiratory:  Normal respiratory effort. Clear to auscultation  bilaterally-no rales or rhonchi.    Cardiovascular:  Regular rate and rhythm with normal S1/S2,no murmurs.    Abdomen: Soft, non-tender, non-distended with normal bowel sounds.    Musculoskeletal:  No joint swellings or deformities ,no b/l  LE edema. No limited  range of joint motions.    Neurologic: Non focal neuro exam , intact sensation and no cranial nerves deficits.    Labs: For convenience and continuity at follow-up the following most recent labs are provided:    CBC:    Lab Results   Component Value Date    WBC 7.1 05/06/2016  HGB 13.1 05/06/2016    HCT 39.4 05/06/2016    PLT 224 05/06/2016       Renal:    Lab Results   Component Value Date    NA 139 05/06/2016    K 4.2 05/06/2016    CL 101 05/06/2016    CO2 26 05/06/2016    BUN 14 05/06/2016    CREATININE 1.1 05/06/2016    CALCIUM 9.4 05/06/2016     MICROBIOLOGY AND PATHOLOGY STUDIES.  None.     PROCEDURES.  S/p Cath w/ PTBA to ISR LCX 05/06/2016     RADIOLOGY.   CXR-05/06/2016.   1.No acute cardiopulmonary process.  ??  LEXISCAN-09/08/2014.??Summary  ??Exercise EKG stress test is not suggestive for ischemia.  ??This Nuclear Medicine study was negative for ischemia.  ??There was evidence of an inferior wall fixed defect.    The defect was small ??in size and mild in severity.    This most likely represents an infarct.  ??  ECHO-05/02/2016.  ??Summary  ??Ejection fraction is visually estimated at 50%.  ??There was mild global hypokinesis of the left ventricle.  ??Mildly dilated left atrium.  ??    Consults:     IP CONSULT TO CARDIOLOGY  IP CONSULT TO CARDIAC REHAB  IP CONSULT TO DIETITIAN    Disposition:     Home        TCU        Rehab        Psych        SNF         Long Term Care Facility        Other-    Condition at Discharge: Stable    Code Status:  Full Code     Patient Instructions:    Discharge lab work:   Activity: activity as tolerated  Diet: DIET CARDIAC; Carb Control: 5 carb choices (75 gms)/meal      Follow-up visits:   Lizabeth Leyden, MD  [address] [place] [address]  [phone]          Micheal [name], Micheal [name]  [address]  [place] [address]  [phone]         Discharge Medications:      Micheal [name]   Home Medication Instructions HAR:[id number]    Printed on:05/07/16 1244   Medication Information                      aspirin 81 MG chewable tablet  Take 1 tablet by mouth daily             atorvastatin (LIPITOR) 80 MG tablet  Take 1 tablet by mouth nightly             busPIRone (BUSPAR) 7.5 MG tablet  Take 1 tablet by mouth 2 times daily             esomeprazole (NEXIUM) 40 MG delayed release capsule  TAKE 1 CAPSULE BY MOUTH EVERY MORNING BEFORE BREAKFAST             fenofibrate (TRICOR) 145 MG tablet  TAKE 1 TABLET BY MOUTH DAILY             fluticasone (FLONASE) 50 MCG/ACT nasal spray  2 sprays by Nasal route daily             glimepiride (AMARYL) 4 MG tablet  Take 1 tablet by mouth every morning  Handicap Placard MISC  by Does not apply route Expires 03/01/2020             hydrocortisone 2.5 % cream  Apply to external ear BID PRN             isosorbide mononitrate (IMDUR) 30 MG extended release tablet  Take 1 tablet by mouth daily             lisinopril (PRINIVIL;ZESTRIL) 40 MG tablet  TAKE 1 TABLET BY MOUTH DAILY             LORazepam (ATIVAN) 0.5 MG tablet  Take 0.5 mg by mouth 2 times daily..             metFORMIN (GLUCOPHAGE) 1000 MG tablet  Take 1 tablet by mouth 2 times daily (with meals)             metoprolol succinate (TOPROL XL) 25 MG extended release tablet  Take 1 tablet by mouth daily             Multiple Vitamins-Minerals (CENTRUM SILVER ULTRA MENS) TABS  Take 1 tablet by mouth daily             nitroGLYCERIN (NITROSTAT) 0.4 MG  SL tablet  Place 0.4 mg under the tongue every 5 minutes as needed for Chest pain             olopatadine (PATANOL) 0.1 % ophthalmic solution  Place 1 drop into both eyes 2 times daily             oxyCODONE-acetaminophen (PERCOCET) 10-325 MG per tablet  Take 1 tablet by mouth every 8 hours as needed for Pain for up to 30 days.             ticagrelor (BRILINTA) 90 MG TABS tablet  Take 1 tablet by mouth 2 times daily                 Time Spent on discharge is more than 30 minutes in the examination, evaluation, counseling and review of medications and discharge plan.    Discharge Medications for PCI/MI (performed or attempted):   ASA:   yes    Statin:   yes  ACE/ARB:  yes   P2Y12 Inhibitor:  yes   Beta Blocker:   yes  Nitro SL:   yes   Cardiac Rehab:  no  Dietary Consult:  no           Signed:    Thank you Micheal [name], Micheal [name] for the opportunity to be involved in this patient's care.    Electronically signed by Sissy Hoff, MD on 05/07/2016 at 12:44 PM

## 2016-05-07 NOTE — Progress Notes (Signed)
Discharge teaching and instructions for diagnosis/procedure of angioplasty completed with patient using teachback method. AVS reviewed. Printed prescriptions given to patient. Patient voiced understanding regarding prescriptions, follow up appointments, and care of self at home. Discharged in a wheelchair to  home with support per family.  Patient stable at discharge.

## 2016-05-07 NOTE — Discharge Instructions (Signed)
Groin Care Instructions        Normal Observation: You may or may not experience these.  ?   Soreness or tenderness that may last a few weeks.  ?   Possible bruising that could last a few weeks and up to one month.  ?  Formation of a small lump (dime to quarter size) that should last only a few weeks.    Care of your incision  ? You may shower 24 hours after the procedure. Wet the dressing thoroughly and gently remove the bandage from the hospital during showering. It is easier to remove this way.            ?  Gently clean your site daily using soap and water while standing in the shower. Dry thoroughly.  ?  Do not apply powders or lotions to the site for 2 weeks.  ?  Keep the site clean and dry to prevent infection.   ?  Do not sit in a bathtub or a pool of water for 7 days.  ?   Inspect the site daily.    Activity  ?  You may resume normal activity in 2 days, including driving, letting pain be your     guide.  ?  Limit lifting over 5 pounds (half gallon of milk) to one week or until site heals.  ? Limit vigorous activity (contact sports) to two weeks time.  ? You will be able to return to work in 1-3 days.    Call our office immediately if you experience any of the following.  ? Significant bleeding does not stop after 10 minutes of applying firm pressure directly over incision.  ?  Increased swelling of groin or leg.  ?  Unusual pain at groin or down that leg.  ?  Signs of infection: redness, warmth to touch, drainage, poorly healing incision, fever, or chills.        Follow with Dr. Gabrielle Dare 3-5 weeks - s\p PTBA           Learning About Percutaneous Coronary Intervention  What is percutaneous coronary intervention?    Percutaneous coronary intervention (PCI) is the name for procedures to open a blocked coronary artery. The two most common are coronary angioplasty and coronary stent placement.  A PCI is a way to open a blocked coronary artery before, during, or after a heart attack. It gets blood flowing to your  heart. And it can help prevent heart problems by widening an artery that has been narrowed by fatty buildup (plaque). This also may be called balloon angioplasty.  Before a PCI, a doctor does a test to find blocked arteries. The test is called cardiac catheterization. The doctor puts a tiny tube called a catheter into an artery in your groin or arm. The doctor moves the catheter through the artery to your heart. Then he or she puts a dye into the catheter. This makes your heart's arteries show up on a screen so the doctor can see any blockages. The test also can measure the pressure inside your heart's chambers.  If you have a blocked artery, the doctor may do an angioplasty or a coronary stent procedure. In an angioplasty, the doctor uses a catheter with a tiny balloon at the tip. He or she puts it into the blocked area and inflates it. The balloon presses the plaque against the walls of the artery. This makes more room for blood to flow. In most cases, the doctor then puts  a stent in the artery. A stent is a small, expandable tube that presses against the walls of the artery. The stent is left in the artery to keep the artery open. This helps blood flow. It also may keep small pieces of plaque from breaking off and causing a heart attack.  How is PCI done?  PCI is done in a cardiac catheterization laboratory ("cath lab"). It is done by a heart specialist called a cardiologist. The whole procedure may take 1 to 3 hours.  You lie on a table under a large X-ray machine. You will get medicine through an IV in one of your veins. It helps you relax and not feel pain. You will be awake during the procedure. But you may not be able to remember much about it.  The doctor will inject some medicine into your arm or groin to numb the skin. You will feel a small needle stick. It's like having a blood test. You may feel some pressure when the doctor puts in the catheter. But you will not feel pain.  The doctor will look at  X-ray pictures on a monitor (like a TV set) to move the catheter to your heart. You may feel warm or flushed for a short time when the doctor injects dye into your artery. The doctor then will inflate a tiny balloon at the end of the catheter. The balloon is inflated for a brief time. Then it is deflated and removed. The doctor also may use the catheter to put a stent in the artery.  What can you expect after PCI?  The catheter will be removed. A nurse or doctor may press on a bandage on the opening. Then a bandage or a compression device may be placed on your groin or wrist at the catheter insertion site. This prevents bleeding. After the test, you will be taken to a room where the catheter site and your heart rate, blood pressure, and temperature will be checked several times. If the catheter was put in your groin, you will need to lie still and keep your leg straight for several hours. If the catheter was put in your arm, you may be able to sit up and get out of bed right away. But you will need to keep your arm still for at least one hour. The average hospital stay is 1 to 2 days for most procedures. When you go home, you will get instructions from your doctor to help you recover well and prevent problems.  Make sure to drink plenty of fluids (unless your doctor tells you not to) for several hours after the test. This will help flush the dye out of your body.  Your doctor will prescribe blood-thinning medicines. You will likely take aspirin plus another blood thinner. It is very important that you take these medicines exactly as directed. They help keep the coronary artery open and reduce your risk of a heart attack.  If you have this procedure, you will still need to make lifestyle changes like eating healthy, being active, and not smoking. This will give you the best chance for a longer, healthier life.  Follow-up care is a key part of your treatment and safety. Be sure to make and go to all appointments, and  call your doctor if you are having problems. It's also a good idea to know your test results and keep a list of the medicines you take.  Where can you learn more?  Go to [email or web address] and sign  in to your MyChart account. Enter [phone] in the Search Health Information box to learn more about "Learning About Percutaneous Coronary Intervention."     If you do not have an account, please click on the "Sign Up Now" link.  Current as of: Jun 03, 2015  Content Version: 11.6   2006-2018 [employer]. Care instructions adapted under license by [facility]. If you have questions about a medical condition or this instruction, always ask your healthcare professional. [employer] disclaims any warranty or liability for your use of this information.

## 2016-05-07 NOTE — Telephone Encounter (Signed)
.  Transition of Care visit scheduled.  06/06/2016  Hospital had requested appt in 3 weeks, but patient already had this appt scheduled for f/u stress.  If you wish to see sooner, please contact patient.  I placed on wait list.  Thanks!

## 2016-05-08 NOTE — Care Coordination-Inpatient (Signed)
[facility] Care Transitions Initial Follow Up Call    Call within 2 business days of discharge: Yes    Patient: Micheal [name] Patient DOB: 01/28/1955   MRN: [id number]  Reason for Admission: CP, s/p cardiac cath w/ percutaneous balloon angioplasty of the left circumflex artery 05/06/16  Discharge Date: 05/07/16 RARS: Readmission Risk Score: 12 CMRS 5     Spoke with: [name]     Facility: [facility]    Non-face-to-face services provided:  Scheduled appointment with PCP-05/11/16 at 1:40  Obtained and reviewed discharge summary and/or continuity of care documents    Care Transitions 24 Hour Call    Schedule Follow Up Appointment with PCP:  Completed  Do you have any ongoing symptoms?:  No  Do you have a copy of your discharge instructions?:  Yes  Do you have all of your prescriptions and are they filled?:  Yes  Have you been contacted by a [facility] Pharmacist?:  No  Have you scheduled your follow up appointment?:  No  Were you discharged with any Home Care or Post Acute Services:  No  Do you feel like you have everything you need to keep you well at home?:  Yes  Care Transitions Interventions     Spoke with [name] for initial care transition call post hospital discharge. Med review completed, 1111F entered. [name] reports that other than his right groin being sore from the heart cath he is feeling "really good" today. He denies any bruising, redness, or drainage from his right groin cath site. He reports that he was able to obtain all of the newly prescribed medications. He reports that the brilinta was very expensive and he is unsure that he'll be able to afford it monthly, CTC encouraged him to speak with Dr. Gabrielle Dare and he may have samples or coupons available, he verbalized understanding. CTC encouraged him to not stop the medication without discussing with his physicians. Obed denies any needs, questions, or concerns at this time.    Follow Up  Future Appointments  Date Time Provider Department Center   05/11/2016 1:40 PM [name], DO [facility]am Med [facility] [place]   06/06/2016 1:00 PM Zoheir Derl Barrow, MD [facility] [place]   07/15/2016 10:00 AM [name], DO Fam Med [facility] [name]   04/14/2017 10:00 AM Zoheir Derl Barrow, MD [facility] - [place] Capuchin, RN

## 2016-05-10 NOTE — Telephone Encounter (Signed)
1st attempt to contact the pt WN:[id number] labs that Dr Ulyess Mort ordered on 07/15/15 (CMP). No answer/no VM so no msg could be left.

## 2016-05-11 ENCOUNTER — Ambulatory Visit: Admit: 2016-05-11 | Discharge: 2016-05-11 | Payer: MEDICARE | Attending: Family Medicine | Primary: Family

## 2016-05-11 DIAGNOSIS — I249 Acute ischemic heart disease, unspecified: Secondary | ICD-10-CM

## 2016-05-11 MED ORDER — NITROGLYCERIN 0.4 MG SL SUBL
0.4 | ORAL_TABLET | SUBLINGUAL | 2 refills | Status: DC | PRN
Start: 2016-05-11 — End: 2018-11-07

## 2016-05-11 MED ORDER — LORAZEPAM 0.5 MG PO TABS
0.5 | ORAL_TABLET | Freq: Three times a day (TID) | ORAL | 0 refills | Status: DC | PRN
Start: 2016-05-11 — End: 2016-06-08

## 2016-05-11 MED ORDER — BUSPIRONE HCL 7.5 MG PO TABS
7.5 | ORAL_TABLET | Freq: Two times a day (BID) | ORAL | 2 refills | Status: DC
Start: 2016-05-11 — End: 2016-07-19

## 2016-05-11 MED FILL — BIVALIRUDIN TRIFLUOROACETATE 250 MG IV SOLR: 250 MG | INTRAVENOUS | Qty: 250

## 2016-05-11 NOTE — Progress Notes (Signed)
Transition of Care Visit/Hospital Follow Up:      Micheal [name] is a 61 y.o. male that presents for Follow-Up from Hospital (follow up STR post cath.   Pt states he is still having right sided groin pain from Cath )        Date of Discharge:   05/07/16  Was patient contacted within 2 business days of discharge (see chart for documentation):  yes - 05/08/16      Patient presents for hospital follow up.  Patient recently hospitalized at [facility] for treatment of treatment of unstable angina.    Symptoms prior to admission:  Persistent CP     Hospital Course per DC Summary:    Hospital Course:   Micheal [name] is a 61 y.o. male admitted to [facility] on 05/06/2016 for recurrent chest d/t unstable angina w/ an abnormal OP cardiac stress test. Resolved Unstable angina after cardiac cat w/ percutaneous balloon angioplasty of the left circumflex artery yesterday -05/06/2016.No complications.  ??  Patient scheduled for elective back at [facility].   Need PCI of the LCX after back surgery.  ??  Stable for d/c to home today.    Medication changes at discharge:  Started on Brillinta, Plavix d/c'd    Clinical course since discharge:  Patient notes that he is continuing to have pain and soreness of the right inguinal area.  No swelling that he has noted.    No CP, palpitations since discharge.  Still notes some SOB. Eating well.  No constipation.    He feels like he is more anxious since his procedure.          Current Outpatient Prescriptions   Medication Sig Dispense Refill   ??? LORazepam (ATIVAN) 0.5 MG tablet Take 1 tablet by mouth every 8 hours as needed for Anxiety for up to 30 days.. 90 tablet 0   ??? busPIRone (BUSPAR) 7.5 MG tablet Take 1 tablet by mouth 2 times daily 60 tablet 2   ??? nitroGLYCERIN (NITROSTAT) 0.4 MG SL tablet Place 1 tablet under the tongue every 5 minutes as needed for Chest pain 25 tablet 2   ??? aspirin 81 MG chewable tablet Take 1 tablet by mouth daily 30 tablet 0   ??? isosorbide mononitrate (IMDUR) 30 MG  extended release tablet Take 1 tablet by mouth daily 30 tablet 3   ??? atorvastatin (LIPITOR) 80 MG tablet Take 1 tablet by mouth nightly 30 tablet 3   ??? ticagrelor (BRILINTA) 90 MG TABS tablet Take 1 tablet by mouth 2 times daily 60 tablet 3   ??? metoprolol succinate (TOPROL XL) 25 MG extended release tablet Take 1 tablet by mouth daily 30 tablet 3   ??? oxyCODONE-acetaminophen (PERCOCET) 10-325 MG per tablet Take 1 tablet by mouth every 8 hours as needed for Pain for up to 30 days. 90 tablet 0   ??? glimepiride (AMARYL) 4 MG tablet Take 1 tablet by mouth every morning 30 tablet 3   ??? fenofibrate (TRICOR) 145 MG tablet TAKE 1 TABLET BY MOUTH DAILY 30 tablet 5   ??? esomeprazole (NEXIUM) 40 MG delayed release capsule TAKE 1 CAPSULE BY MOUTH EVERY MORNING BEFORE BREAKFAST 30 capsule 5   ??? lisinopril (PRINIVIL;ZESTRIL) 40 MG tablet TAKE 1 TABLET BY MOUTH DAILY 30 tablet 5   ??? metFORMIN (GLUCOPHAGE) 1000 MG tablet Take 1 tablet by mouth 2 times daily (with meals) 180 tablet 3   ??? hydrocortisone 2.5 % cream Apply to external ear BID  PRN 1 Tube 2   ??? olopatadine (PATANOL) 0.1 % ophthalmic solution Place 1 drop into both eyes 2 times daily 1 Bottle 5   ??? fluticasone (FLONASE) 50 MCG/ACT nasal spray 2 sprays by Nasal route daily 1 Bottle 5   ??? Handicap Placard MISC by Does not apply route Expires 03/01/2020 1 each 0   ??? Multiple Vitamins-Minerals (CENTRUM SILVER ULTRA MENS) TABS Take 1 tablet by mouth daily       No current facility-administered medications for this visit.        Past Medical History:   Diagnosis Date   ??? CAD (coronary artery disease)    ??? Diabetes mellitus (HCC)    ??? Hyperlipidemia    ??? Hypertension        Past Surgical History:   Procedure Laterality Date   ??? CORONARY ARTERY BYPASS GRAFT     ??? ORTHOPEDIC SURGERY Left        Social History   Substance Use Topics   ??? Smoking status: Former Smoker     Types: Cigarettes     Quit date: 05/24/2008   ??? Smokeless tobacco: Never Used   ??? Alcohol use 7.2 oz/week     12 Cans of  beer per week      Comment: 12 beers daily       Family History   Problem Relation Age of Onset   ??? Cancer Mother    ??? Heart Disease Brother    ??? Stroke Brother    ??? Diabetes Brother    ??? Early Death Brother    ??? High Blood Pressure Brother    ??? High Cholesterol Brother    ??? Mental Retardation Sister          I have reviewed the patient's past medical history, past surgical history, allergies, medications, social and family history and I have made updates where appropriate.        PHYSICAL EXAM:  Vitals:    05/11/16 1334   BP: 132/70   Pulse: 80   Resp: 15   Temp: 98.1 ??F (36.7 ??C)     General Appearance: well developed and well- nourished, in no acute distress  Head: normocephalic and atraumatic  ENT: external ear and ear canal normal bilaterally, nose without deformity, nasal mucosa and turbinates normal without polyps, oropharynx normal, dentition is normal for age, no lip or gum lesions noted  Neck: supple and non-tender without mass, no thyromegaly or thyroid nodules, no cervical lymphadenopathy  Pulmonary/Chest: clear to auscultation bilaterally- no wheezes, rales or rhonchi, normal air movement, no respiratory distress or retractions  Cardiovascular: normal rate, regular rhythm, normal S1 and S2, no murmurs, rubs, clicks, or gallops, distal pulses intact  Abdomen: soft, non-tender, non-distended, no rebound or guarding, no masses or hernias noted, no hepatospleenomegaly  Extremities: no cyanosis, clubbing or edema of the lower extremities  Psych:  Normal affect without evidence of depression or anxiety, insight and judgement are appropriate, memory appears intact  Skin: warm and dry, nright inguinal area with ecchymosis.  No hematoma, swelling or increased warmth noted.      Diagnostic test results reviewed: inpatient labs and cath report    Patient risk of morbidity and mortality: high      ASSESSMENT & PLAN  Micheal [name] was seen today for follow-up from hospital.    Diagnoses and all orders for this visit:    ACS  (acute coronary syndrome) (HCC)    GAD (generalized anxiety disorder)  -  LORazepam (ATIVAN) 0.5 MG tablet; Take 1 tablet by mouth every 8 hours as needed for Anxiety for up to 30 days..  -     busPIRone (BUSPAR) 7.5 MG tablet; Take 1 tablet by mouth 2 times daily    Coronary artery disease involving native coronary artery of native heart with unstable angina pectoris (HCC)  -     nitroGLYCERIN (NITROSTAT) 0.4 MG SL tablet; Place 1 tablet under the tongue every 5 minutes as needed for Chest pain        Return if symptoms worsen or fail to improve.    Level of medical complexity:  high    -Overall, patient is improving  -Will increase Ativan to TID for 1 month  -Cath site looks good, advised on conservative treatment  -Continue current meds  -Advised to continue to closely monitor her symptoms and if any worsening to seek treatment    Controlled Substances Monitoring:     The Prescription Monitoring Report for this patient was reviewed today. [name] Nijah Tejera, DO)    No signs of potential drug abuse or diversion identified. [name], DO)        Raeshaun received counseling on the following healthy behaviors: medication adherence  Reviewed prior labs and health maintenance.  Continue current medications, diet and exercise.  Discussed use, benefit, and side effects of prescribed medications. Barriers to medication compliance addressed.   Patient given educational materials - see patient instructions.    All patient questions answered.  Patient voiced understanding.

## 2016-05-11 NOTE — Patient Instructions (Signed)
You may receive a survey about your visit with us today. The feedback from our patients helps us identify what is working well and where the service to all patients can be enhanced. Thank you!

## 2016-05-11 NOTE — Progress Notes (Signed)
Visit Information    Have you changed or started any medications since your last visit including any over-the-counter medicines, vitamins, or herbal medicines? no   Are you having any side effects from any of your medications? -  no  Have you stopped taking any of your medications? Is so, why? -  no    Have you seen any other physician or provider since your last visit? Yes - Records Obtained  Have you had any other diagnostic tests since your last visit? Yes - Records Obtained  Have you been seen in the emergency room and/or had an admission to a hospital since we last saw you? Yes - Records Obtained  Have you had your routine dental cleaning in the past 6 months? no    Have you activated your MyChart account? If not, what are your barriers? No     Patient Care Team:  [name], DO as PCP - General (Family Medicine)  [name], DO as PCP - [facility] Attributed Provider  Lovett Sox, RN as Care Transition    Medical History Review  Past Medical, Family, and Social History     Defer to provider.

## 2016-05-13 NOTE — Care Coordination-Inpatient (Signed)
[facility] Health Care Transitions Follow Up Call    05/13/2016    Patient: Micheal [name]  Patient DOB: 28-May-1955   MRN: [id number]  Reason for Admission: There are no discharge diagnoses documented for the most recent discharge.  Discharge Date: 05/07/16 RARS: Readmission Risk Score: 12       Spoke with: [name]    Care Transitions 24 Hour Call    Schedule Follow Up Appointment with PCP:  Completed  Do you have any ongoing symptoms?:  Yes- Pain  Did you notify your Physician?: Yes- told to ice the area  Do you have a copy of your discharge instructions?:  Yes  Do you have all of your prescriptions and are they filled?:  Yes  Have you been contacted by a [facility] Pharmacist?:  No  Have you scheduled your follow up appointment?:  Yes- Completed  Were you discharged with any Home Care or Post Acute Services:  No  Do you feel like you have everything you need to keep you well at home?:  Yes  Care Transitions Interventions    Spoke with [name] today for Care Transition follow up call.   Neven reports, "my crotch still hurts" when asked how is doing.   States the groin area is still black and blue.  He reports no discharge from the area.  Reviewed with [name] his HFU visit with PCP and recommendations that were given.  Salam states he informed his PCP of the pain and was told that he should not be lifting over 5 lbs and not driving.  He was told to ice the area with "frozen peas".   Mica reports that he lives alone and has no one to help out.  He states he has to drive and lift things.  Reminded Jahkai to be as cautious as he can and to limit those activities to only what is absolutely necessary until he has a full clearance from his doctor.  Lexander reports that he cannot use the "frozen peas" stating, "it bothers me too much, the cold and the pain".  Processed with Konor that he should not place the frozen item directly on his skin and to try using a thin cloth or pillow case to cover the frozen item before using it.  Othell was  agreeable to try this.  Hillman reports being compliant with all medications.   He had no other concerns or questions.          Follow Up  Future Appointments  Date Time Provider Department Center   06/06/2016 1:00 PM Zoheir Derl Barrow, MD [facility] [place]   07/15/2016 10:00 AM [name], DO Fam Med [facility] - [name]   04/14/2017 10:00 AM Zoheir Derl Barrow, MD [facility] - [address] [place], [place], [place]

## 2016-05-19 ENCOUNTER — Telehealth

## 2016-05-19 ENCOUNTER — Ambulatory Visit: Admit: 2016-05-19 | Discharge: 2016-05-19 | Payer: MEDICARE | Attending: Family Medicine | Primary: Family

## 2016-05-19 DIAGNOSIS — J301 Allergic rhinitis due to pollen: Secondary | ICD-10-CM

## 2016-05-19 MED ORDER — OXYCODONE-ACETAMINOPHEN 10-325 MG PO TABS
10-325 | ORAL_TABLET | Freq: Three times a day (TID) | ORAL | 0 refills | Status: DC | PRN
Start: 2016-05-19 — End: 2016-06-16

## 2016-05-19 MED ORDER — MONTELUKAST SODIUM 10 MG PO TABS
10 MG | ORAL_TABLET | Freq: Every day | ORAL | 3 refills | Status: DC
Start: 2016-05-19 — End: 2016-09-05

## 2016-05-19 MED ORDER — PAROXETINE HCL 10 MG PO TABS
10 MG | ORAL_TABLET | Freq: Every day | ORAL | 3 refills | Status: DC
Start: 2016-05-19 — End: 2016-09-05

## 2016-05-19 NOTE — Telephone Encounter (Signed)
Requesting prescription for percocet 10-325 one every 8 hours as needed. [address] Also, he is having anxiety  attacks so bad that he couldn't sleep last night. Please advise. DOLV 05/11/16, DONV 07/15/16.

## 2016-05-19 NOTE — Telephone Encounter (Signed)
Pt informed and verbalized understanding.  appt schededuled with Annia Friendly

## 2016-05-19 NOTE — Progress Notes (Signed)
SUBJECTIVE:  Micheal [name] is a 61 y.o. y/o male that presents with Pharyngitis (started  over last 4 weeks ) and Anxiety ( worse  having it every day )  .    HPI:      Symptoms have been present for 4 week(s).  Fever?  No  Odynophagia? No  Dysphagia?  No  Cough?  Yes - 'sometimes'  Rhinitis?  Yes - with post nasal drainages  Hx of EBV? No  Sick Contacts? No    Pt denies SOB, wheezing, stridor, nausea or vomiting.      Patient states that his anxiety has been worsening recently.  He is on Buspar and Ativan currently.  Notes difficulty sleeping due to the anxiety.  Does not feel like the lorazepam is effective for his anxiety as it was before.        OBJECTIVE:  BP 128/76   Pulse 88   Temp 98.3 F (36.8 C) (Oral)   Resp 12   Ht  (1.702 m)   Wt 215 lb (97.5 kg)   BMI 33.67 kg/m   Physical Examination:   General appearance - alert, well appearing, and in no distress  Ears - bilateral TM's and external ear canals normal  Nose - mucosa congested  Mouth - mucous membranes moist, pharynx normal without lesions  Neck - supple, no significant adenopathy  Chest - clear to auscultation, no wheezes, rales or rhonchi, symmetric air entry  Heart - normal rate, regular rhythm, normal S1, S2, no murmurs, rubs, clicks or gallops  Abdomen - soft, nontender, nondistended, no masses or organomegaly  Musculoskeletal - no joint tenderness, deformity or swelling  Skin exam - normal coloration and turgor, no rashes, no suspicious skin lesions noted.          ASSESSMENT & PLAN  Tj was seen today for pharyngitis and anxiety.    Diagnoses and all orders for this visit:    Acute nonseasonal allergic rhinitis due to pollen  -     montelukast (SINGULAIR) 10 MG tablet; Take 1 tablet by mouth daily    GAD (generalized anxiety disorder)  -     PARoxetine (PAXIL) 10 MG tablet; Take 1 tablet by mouth daily    -Will add paxil for anxiety  -Sore throat appears related to allergies, will start singulair    Return if symptoms worsen or  fail to improve.    -Patient advised to call immediately or go to ER if any worsening of symptoms  -Patient counseled on conservative care including fluids, rest and OTC meds      [name] received counseling on the following healthy behaviors: medication adherence  Reviewed prior labs and health maintenance.  Continue current medications, diet and exercise.  Discussed use, benefit, and side effects of prescribed medications. Barriers to medication compliance addressed.   Patient given educational materials - see patient instructions.    All patient questions answered.  Patient voiced understanding.

## 2016-05-19 NOTE — Patient Instructions (Signed)
You may receive a survey about your visit with us today. The feedback from our patients helps us identify what is working well and where the service to all patients can be enhanced. Thank you!

## 2016-05-19 NOTE — Telephone Encounter (Signed)
Percocet sent.  If he is having anxiety attacks that bad, I would recommend he make an appt with Dr Ivy Lynn.    Controlled Substances Monitoring:     The Prescription Monitoring Report for this patient was reviewed today. [name] Yi Falletta, DO)    No signs of potential drug abuse or diversion identified. [name], DO)

## 2016-05-20 NOTE — Care Coordination-Inpatient (Signed)
[facility] Care Transitions Follow Up Call    05/20/2016    Patient: Micheal [name]  Patient DOB: 1955-02-27   MRN: [id number]  Reason for Admission: There are no discharge diagnoses documented for the most recent discharge.  Discharge Date: 05/07/16 RARS: Readmission Risk Score: 12       Spoke with: [name]    Care Transitions 24 Hour Call   Schedule Follow Up Appointment with PCP: Completed  Do you have any ongoing symptoms?: Yes- Pain  Did you notify your Physician?: Yes- told to ice the area  Do you have a copy of your discharge instructions?: Yes  Do you have all of your prescriptions and are they filled?: Yes  Have you been contacted by a [facility] Pharmacist?: No  Have you scheduled your follow up appointment?: Yes- Completed  Were you discharged with any Home Care or Post Acute Services: No  Do you feel like you have everything you need to keep you well at home?: Yes  Care Transitions Interventions    Spoke with [name] for final care transition call.  [name] states he is "a little better".  He reports having increased anxiety and that he called and was seen by his PCP.  He reports having a new medication, Paxil (10 mg tablet 1 time daily).  He states he did fill the prescription and has started to take it.  He states he made an appointment with Dr. Annia Friendly as his PCP suggested and will be seen on  06-02-16.  [name] states his groin area is still a little black and blue.  He reports pain still with lifting.  He is no longer using ice to the area.  He is agreeable to this being the final CTC call and has no other questions or concerns.      Follow Up  Future Appointments  Date Time Provider Department Center   06/02/2016 1:30 PM Rica Records, PSY.D [facility] [place]   06/06/2016 1:00 PM [name] Balboa, MD [facility] [place]   07/15/2016 10:00 AM [name], DO [facility] [name]   04/14/2017 10:00 AM Zoheir Derl Barrow, MD [facility] - [address] [place], [place], [place]

## 2016-05-25 NOTE — Telephone Encounter (Signed)
2nd attempt to contact the pt ZO:XWRUEAV labs Dr Ulyess Mort ordered on 07/15/15. HIPAA form is up to date, orders mailed.

## 2016-05-31 ENCOUNTER — Inpatient Hospital Stay: Payer: MEDICARE | Primary: Family

## 2016-05-31 DIAGNOSIS — E119 Type 2 diabetes mellitus without complications: Secondary | ICD-10-CM

## 2016-05-31 LAB — LIPID PANEL
Cholesterol, Total: 156 mg/dL (ref 100–199)
HDL: 31 mg/dL
LDL Calculated: 91 mg/dL
Triglycerides: 168 mg/dL (ref 0–199)

## 2016-05-31 LAB — COMPREHENSIVE METABOLIC PANEL
ALT: 41 U/L (ref 11–66)
AST: 27 U/L (ref 5–40)
Albumin: 4.7 g/dL (ref 3.5–5.1)
Alkaline Phosphatase: 36 U/L — ABNORMAL LOW (ref 38–126)
BUN: 18 mg/dL (ref 7–22)
CO2: 25 meq/L (ref 23–33)
Calcium: 9.7 mg/dL (ref 8.5–10.5)
Chloride: 100 meq/L (ref 98–111)
Creatinine: 1 mg/dL (ref 0.4–1.2)
Glucose: 102 mg/dL (ref 70–108)
Potassium: 4.4 meq/L (ref 3.5–5.2)
Sodium: 138 meq/L (ref 135–145)
Total Bilirubin: 0.5 mg/dL (ref 0.3–1.2)
Total Protein: 8.5 g/dL — ABNORMAL HIGH (ref 6.1–8.0)

## 2016-05-31 LAB — ANION GAP: Anion Gap: 13 meq/L (ref 8.0–16.0)

## 2016-05-31 LAB — GLOMERULAR FILTRATION RATE, ESTIMATED: Est, Glom Filt Rate: 76 mL/min/{1.73_m2} — AB

## 2016-05-31 NOTE — Telephone Encounter (Signed)
-----   Message from Cecille RubinMark D Kahle, DO sent at 05/31/2016  9:18 AM EDT -----  Labs look ok, recommend continuing current meds.

## 2016-05-31 NOTE — Telephone Encounter (Signed)
Pt notified

## 2016-06-02 ENCOUNTER — Ambulatory Visit: Admit: 2016-06-02 | Discharge: 2016-06-02 | Payer: MEDICARE | Attending: Clinical | Primary: Family

## 2016-06-02 DIAGNOSIS — F410 Panic disorder [episodic paroxysmal anxiety] without agoraphobia: Secondary | ICD-10-CM

## 2016-06-02 NOTE — Patient Instructions (Addendum)
1.  Try the deep breathing we talked about for a few minutes per day.  This can help with panic attacks.      2.  Try the Stop, Breathe & Think app to help put things in perspective for you.      3.  Check with your cardiologist about appropriate exercises you can do to help with anxiety.      4.  Return to see Dr. Ivy Lynn in 2-4 weeks.             Panic Disorder Handout    What Is a Panic Attack?  Sometimes we experience a sudden and severe onset of symptoms that can be scary. These symptoms can include some or all of the following:    . Pounding heart or increased heart rate  . Feeling dizzy, unsteady, lightheaded, or faint  . Sweating  . Nausea  . Feelings of unreality or being detached from yourself  . Trembling or shaking  . Fear of losing control or going crazy  . Shortness of breath  . Fear of dying  . Feeling of choking  . Numbness or tingling  . Chest pain  . Chills or hot flashes    Although we don't fully understand why some people experience panic attacks and other people don't, we do know that these symptoms are related to a very normal response called the fight-flight response. This response allows our body to react quickly when we think that something is dangerous, such as being attacked or being cut off when we are driving.    How Panic Attacks Affect Our Lives    Because symptoms of a panic attack occur out of the blue, we can become worried about them, and we may begin to avoid situations that we think will result in these panic symptoms, such as crowded stores, public transportation, or driving. What situations have you avoided because of panic attacks?                                                                                                                                                                                                                              Changing Thinking Patterns    One of the most important changes associated with decreasing panic attacks is changing how we  think. The fear associated with having a panic attack may increase the likelihood of having an attack. Therefore, a willingness to experience a panic attack, knowing that  the symptoms, although uncomfortable, will not harm you, is an important aspect of managing the symptoms of panic.    Thinking that increases panic          Thinking that decreases panic  I'm having a heart attack!    This is not an emergency.  I'm going to die.        This doesn't feel good, it won't hurt me.  I can't stand this.       I can feel uncomfortable and still be OK.  I have to get out of here.     This will go away with time.  Oh no, here it comes!      I can handle this.    What are the things that you say to yourself that may increase panic symptoms?                                                                                                                                                                  What could you say to decrease panic symptoms?                                                                                                                                                                                                                     Breathing Retraining    People who have panic attacks show some signs of hyperventilation or overbreathing. When people hyperventilate, certain blood vessels in the body become narrower, which can contribute to numbness or tingling in the hands or feet or the sensation of cold, clammy hands, and increased heart rate. You can help overcome overbreathing by learning breathing control.    Instructions for Breathing Retraining:    1. Choose a comfortable quiet location.  2.  Count, "One," on the first breath in, and think, "Relax," on the breath out.  3. Focus your attention on breathing and counting.  4. Maintain a normal rate and depth of breathing.  5. Expand your abdomen on the breath in and keep your chest still.  6. Continue counting up to 10 and back to 1.  7. Practice two  times per day, for 10 minutes each time.        Decreasing Avoidance    Regardless of whether you can identify why you began having panic attacks or whether they seemed to come out of the blue, the places where you began having panic attacks often can become triggers themselves.    To break the cycle of avoidance, it is important to first identify the places or situations that are being avoided and then to do some "relearning." Just as the negative experience of a panic attack can result in learning to avoid certain locations, having positive, successful experiences can result in learning that the location is nothing to be afraid of.    Which item on your list of avoided locations or situations would you like to target first?  Please list this situation here: _______________________    Now, you can develop a hierarchy for this situation or location. A hierarchy is a list of situations that result in increasingly higher intensities of anxiety. Develop a list of situations that result in a range of anxiety intensities, that is, some result in low intensity and others result in higher intensities. Then rank order the situation from the lowest to the highest anxiety producing situations. This hierarchy will help guide you as you gradually begin to "expose" yourself to the situation or location that you have been avoiding.      What is deep breathing?    Deep breathing involves using your diaphragm muscle to help bring about a state of physiological relaxation.  The diaphragm is a large muscle that rests across the bottom of your rib cage.  When you inhale, the diaphragm muscle drops, opening up space so air can come in.  When watching someone do this it looks like your stomach is filling with air.  This type of breathing helps activate the part of your nervous system that controls relaxation.  It can lead to decreased heart rate, blood pressure, muscle tension and overall feelings of relaxation.      Why be concerned  with how I'm breathing?    -To increase your awareness of the role that breathing plays in increased physical tension and contributing to your body's stress response.  -To lower your level of stress-related arousal and tension.  -To give you a method of taking calm, relaxing breaths immediately in a stressful situation to break the cycle of increasing arousal.    What is the best way to use deep breathing exercises?    -Use deep breathing frequently.  -Take deep breaths at the first signs of stress, anxiety, physical tension, or other symptoms.  -Schedule time for relaxation.  My scheduled time for deep breathing will be _________.

## 2016-06-02 NOTE — Progress Notes (Signed)
Behavioral Health Consultation  Luvenia Heller, PsyD  Psychologist  06/02/2016  1:36 PM      Time spent with Patient:  30 minutes  This is patient's first  [facility] appointment.    Reason for Consult:  anxiety and stress  Referring Provider: [name], DO  [address]  [place], [facility] [id number]    Pt provided informed consent for the behavioral health program. Discussed with patient model of service to include the limits of confidentiality (i.e. abuse reporting, suicide intervention, etc.) and short-term intervention focused approach.  Pt indicated understanding.  Feedback given to PCP.    S:  Pt said he's been having severe panic attacks since his hospital visit on 05/06/16.  Pt said he and his wife have been separated for two years now.    Pt said he hasn't drank for the past 6 mos.  His son lives here in [place] but he doesn't talk with him b/c he doesn't get along with his son's wife.  Pt has several significant health problems.  We spent time talking about things that may help him work through a panic attack and avoid them altogether.      O:  MSE:    Appearance    alert, cooperative, moderate distress  Appetite normal  Sleep disturbance Yes  Loss of pleasure Some  Speech    spontaneous, normal rate, normal volume and well articulated  Mood    Anxious, occasional panic attacks  Affect    anxiety  Thought Content    intact, cognitive distortions and all or nothing thinking  Insight    Fair/Poor  Judgment    Intact  Suicide Assessment    no suicidal ideation      History:    Medications:   Current Outpatient Prescriptions   Medication Sig Dispense Refill   . oxyCODONE-acetaminophen (PERCOCET) 10-325 MG per tablet Take 1 tablet by mouth every 8 hours as needed for Pain for up to 30 days.. 90 tablet 0   . montelukast (SINGULAIR) 10 MG tablet Take 1 tablet by mouth daily 30 tablet 3   . PARoxetine (PAXIL) 10 MG tablet Take 1 tablet by mouth daily 30 tablet 3   . LORazepam (ATIVAN) 0.5 MG tablet Take 1 tablet by mouth every 8 hours  as needed for Anxiety for up to 30 days.. 90 tablet 0   . busPIRone (BUSPAR) 7.5 MG tablet Take 1 tablet by mouth 2 times daily 60 tablet 2   . nitroGLYCERIN (NITROSTAT) 0.4 MG SL tablet Place 1 tablet under the tongue every 5 minutes as needed for Chest pain 25 tablet 2   . aspirin 81 MG chewable tablet Take 1 tablet by mouth daily 30 tablet 0   . isosorbide mononitrate (IMDUR) 30 MG extended release tablet Take 1 tablet by mouth daily 30 tablet 3   . atorvastatin (LIPITOR) 80 MG tablet Take 1 tablet by mouth nightly 30 tablet 3   . ticagrelor (BRILINTA) 90 MG TABS tablet Take 1 tablet by mouth 2 times daily 60 tablet 3   . metoprolol succinate (TOPROL XL) 25 MG extended release tablet Take 1 tablet by mouth daily 30 tablet 3   . glimepiride (AMARYL) 4 MG tablet Take 1 tablet by mouth every morning 30 tablet 3   . fenofibrate (TRICOR) 145 MG tablet TAKE 1 TABLET BY MOUTH DAILY 30 tablet 5   . esomeprazole (NEXIUM) 40 MG delayed release capsule TAKE 1 CAPSULE BY MOUTH EVERY MORNING BEFORE BREAKFAST  30 capsule 5   . lisinopril (PRINIVIL;ZESTRIL) 40 MG tablet TAKE 1 TABLET BY MOUTH DAILY 30 tablet 5   . metFORMIN (GLUCOPHAGE) 1000 MG tablet Take 1 tablet by mouth 2 times daily (with meals) 180 tablet 3   . hydrocortisone 2.5 % cream Apply to external ear BID PRN 1 Tube 2   . olopatadine (PATANOL) 0.1 % ophthalmic solution Place 1 drop into both eyes 2 times daily 1 Bottle 5   . fluticasone (FLONASE) 50 MCG/ACT nasal spray 2 sprays by Nasal route daily 1 Bottle 5   . Handicap Placard MISC by Does not apply route Expires 03/01/2020 1 each 0   . Multiple Vitamins-Minerals (CENTRUM SILVER ULTRA MENS) TABS Take 1 tablet by mouth daily       No current facility-administered medications for this visit.        Social History:   Social History     Social History   . Marital status: Married     Spouse name: N/A   . Number of children: N/A   . Years of education: N/A     Occupational History   . Not on file.     Social History  Main Topics   . Smoking status: Former Smoker     Types: Cigarettes     Quit date: 05/24/2008   . Smokeless tobacco: Never Used   . Alcohol use 7.2 oz/week     12 Cans of beer per week      Comment: 12 beers daily   . Drug use: No   . Sexual activity: Yes     Other Topics Concern   . Not on file     Social History Narrative   . No narrative on file       TOBACCO:   reports that he quit smoking about 8 years ago. His smoking use included Cigarettes. He has never used smokeless tobacco.  ETOH:   reports that he drinks about 7.2 oz of alcohol per week .    Family History:   Family History   Problem Relation Age of Onset   . Cancer Mother    . Heart Disease Brother    . Stroke Brother    . Diabetes Brother    . Early Death Brother    . High Blood Pressure Brother    . High Cholesterol Brother    . Mental Retardation Sister            A:    Administered PHQ-9 and GAD-7 (see below). Patient endorses moderate symptoms of depression and mild symptoms of anxiety, with occasional panic attacks.    The anxiety and panic seem to be at the root of his issues right now.  Pt is willing to try brief psychotherapy to learn strategies for managing anxiety and panic symptoms.    There have been a pattern of panic attacks not limited to being in public places.        PHQ Scores 06/02/2016 01/13/2016 09/24/2014   PHQ2 Score 3 0 0   PHQ9 Score 10 0 0     Interpretation of Total Score Depression Severity: 1-4 = Minimal depression, 5-9 = Mild depression, 10-14 = Moderate depression, 15-19 = Moderately severe depression, 20-27 = Severe depression      GAD-7    Over the last 2 weeks, how often have you been bothered by the following problems?     1. Feeling nervous, anxious, or on edge   [  ]  Not at all (0)  [  ] Several days (1)  [ X ] Over half the days (2)  [  ] Nearly every day (3)    2. Not being able to stop or control worrying    [  ] Not at all (0)  [ X ] Several days (1)  [  ] Over half the days (2)  [  ] Nearly every day (3)    3.  Worrying too much about different things    [  ] Not at all (0)  [ X ] Several days (1)  [  ] Over half the days (2)  [  ] Nearly every day (3)    4. Trouble relaxing    [ X ] Not at all (0)  [  ] Several days (1)  [  ] Over half the days (2)  [  ] Nearly every day (3)    5. Being so restless that it's hard to sit still    [ X ] Not at all (0)  [  ] Several days (1)  [  ] Over half the days (2)  [  ] Nearly every day (3)    6. Becoming easily annoyed or irritable    [  ] Not at all (0)  [ X ] Several days (1)  [  ] Over half the days (2)  [  ] Nearly every day (3)    7. Feeling afraid as if something awful might happen    [ X ] Not at all (0)  [  ] Several days (1)  [  ] Over half the days (2)  [  ] Nearly every day (3)    Total Score:  Severity:  [  ] 0-4 Subclinical  [ 5 ] 5-9 Mild  [  ] 10-14 Moderate  [  ] 15-21 Severe        Diagnosis:    Panic disorder without agoraphobia      Diagnosis Date   . CAD (coronary artery disease)    . Diabetes mellitus (HCC)    . Hyperlipidemia    . Hypertension      Problems with primary support group, Problems related to the social environment and Other psychosocial and environmental problems      Plan:  Pt interventions:  Provided handout on  anxiety and stress, Provided education on the use of medication to treat  depression and anxiety, Trained in strategies for increasing balanced thinking, Discussed and set plan for behavioral activation, Trained in relaxation strategies, Provided education, Discussed and problem-solved barriers in adhering to behavioral change plan, Motivational Interviewing to increase patient confidence and compliance with adhering to behavioral change plan, Motivational Interviewing to determine importance and readiness for change, Discussed potential barriers to change, Agenda-setting to identify pt[facility], Supportive techniques, Explained relaxed breathing technique in detail and practiced this with pt in visit  and Provided pt list of websites and several smartphone app resources for further practicing guided meditations and breathing exercises      Pt Behavioral Change Plan:  1.  Try the deep breathing we talked about for a few minutes per day.  This can help with panic attacks.      2.  Try the Stop, Breathe & Think app to help put things in perspective for you.      3.  Check with your cardiologist about appropriate exercises you can do  to help with anxiety.      4.  Return to see Dr. Ivy Lynn in 2-4 weeks.

## 2016-06-06 ENCOUNTER — Ambulatory Visit: Admit: 2016-06-06 | Discharge: 2016-06-06 | Payer: MEDICARE | Attending: Cardiovascular Disease | Primary: Family

## 2016-06-06 DIAGNOSIS — I10 Essential (primary) hypertension: Secondary | ICD-10-CM

## 2016-06-06 NOTE — Progress Notes (Signed)
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    Patient complains of chest pain and sob.

## 2016-06-06 NOTE — Telephone Encounter (Addendum)
LM with oncology VM Beth at Dr. Harden MoScharschmidt's office to have him call Dr. Gabrielle DareBaki on his cell.

## 2016-06-06 NOTE — Progress Notes (Signed)
[facility]  [address]  [address]  [place] [address]  Dept: [phone]  Dept Fax: [phone]  Loc: [phone]    Visit Date: 06/06/2016    Micheal [name] is a 61 y.o. male who presents today for:  Chief Complaint   Patient presents with   . Follow-Up from Hospital   . Coronary Artery Disease   . Pre-op Exam   . Hypertension   . Shortness of Breath     Supposed to have a back surgery   Cath done by makwana  Disease in the stent of the left circ  Not stented or ballooned due to surgery coming up   No active angina  Does have angina on exertion   Previous CABg and stents in [place]  Here to discuss surgery   Doing well with medical Rx     HPI:  HPI  Past Medical History:   Diagnosis Date   . CAD (coronary artery disease)    . Diabetes mellitus (HCC)    . Hyperlipidemia    . Hypertension       Past Surgical History:   Procedure Laterality Date   . CORONARY ARTERY BYPASS GRAFT     . ORTHOPEDIC SURGERY Left      Family History   Problem Relation Age of Onset   . Cancer Mother    . Heart Disease Brother    . Stroke Brother    . Diabetes Brother    . Early Death Brother    . High Blood Pressure Brother    . High Cholesterol Brother    . Mental Retardation Sister      Social History   Substance Use Topics   . Smoking status: Former Smoker     Types: Cigarettes     Quit date: 05/24/2008   . Smokeless tobacco: Never Used   . Alcohol use 7.2 oz/week     12 Cans of beer per week      Comment: 12 beers daily      Current Outpatient Prescriptions   Medication Sig Dispense Refill   . oxyCODONE-acetaminophen (PERCOCET) 10-325 MG per tablet Take 1 tablet by mouth every 8 hours as needed for Pain for up to 30 days.. 90 tablet 0   . montelukast (SINGULAIR) 10 MG tablet Take 1 tablet by mouth daily 30 tablet 3   . PARoxetine (PAXIL) 10 MG tablet Take 1 tablet by mouth daily 30 tablet 3   . LORazepam (ATIVAN) 0.5 MG tablet Take 1 tablet by mouth every 8 hours as needed for Anxiety for up to  30 days.. 90 tablet 0   . busPIRone (BUSPAR) 7.5 MG tablet Take 1 tablet by mouth 2 times daily 60 tablet 2   . nitroGLYCERIN (NITROSTAT) 0.4 MG SL tablet Place 1 tablet under the tongue every 5 minutes as needed for Chest pain 25 tablet 2   . aspirin 81 MG chewable tablet Take 1 tablet by mouth daily 30 tablet 0   . isosorbide mononitrate (IMDUR) 30 MG extended release tablet Take 1 tablet by mouth daily 30 tablet 3   . atorvastatin (LIPITOR) 80 MG tablet Take 1 tablet by mouth nightly 30 tablet 3   . ticagrelor (BRILINTA) 90 MG TABS tablet Take 1 tablet by mouth 2 times daily 60 tablet 3   . metoprolol succinate (TOPROL XL) 25 MG extended release tablet Take 1 tablet by mouth daily 30 tablet 3   .  glimepiride (AMARYL) 4 MG tablet Take 1 tablet by mouth every morning 30 tablet 3   . fenofibrate (TRICOR) 145 MG tablet TAKE 1 TABLET BY MOUTH DAILY 30 tablet 5   . esomeprazole (NEXIUM) 40 MG delayed release capsule TAKE 1 CAPSULE BY MOUTH EVERY MORNING BEFORE BREAKFAST 30 capsule 5   . lisinopril (PRINIVIL;ZESTRIL) 40 MG tablet TAKE 1 TABLET BY MOUTH DAILY 30 tablet 5   . metFORMIN (GLUCOPHAGE) 1000 MG tablet Take 1 tablet by mouth 2 times daily (with meals) 180 tablet 3   . hydrocortisone 2.5 % cream Apply to external ear BID PRN 1 Tube 2   . olopatadine (PATANOL) 0.1 % ophthalmic solution Place 1 drop into both eyes 2 times daily 1 Bottle 5   . fluticasone (FLONASE) 50 MCG/ACT nasal spray 2 sprays by Nasal route daily 1 Bottle 5   . Handicap Placard MISC by Does not apply route Expires 03/01/2020 1 each 0   . Multiple Vitamins-Minerals (CENTRUM SILVER ULTRA MENS) TABS Take 1 tablet by mouth daily       No current facility-administered medications for this visit.      No Known Allergies  Health Maintenance   Topic Date Due   . Hepatitis C screen  1955-10-17   . Diabetic retinal exam  06/11/1964   . HIV screen  06/11/1969   . DTaP/Tdap/Td vaccine (1 - Tdap) 06/11/1973   . Shingles Vaccine (1 of 2 - 2 Dose Series)  06/11/2004   . Diabetic foot exam  07/14/2016   . A1C test (Diabetic or Prediabetic)  04/12/2017   . Diabetic microalbuminuria test  04/14/2017   . Lipid screen  05/31/2017   . Potassium monitoring  05/31/2017   . Creatinine monitoring  05/31/2017   . Colon cancer screen colonoscopy  07/25/2023   . Flu vaccine  Completed   . Pneumococcal med risk  Completed       Subjective:  Review of Systems  General:   No fever, no chills, No fatigue or weight loss  Pulmonary:    some dyspnea, no wheezing  Cardiac:    Denies recent chest pain,   GI:     No nausea or vomiting, no abdominal pain  Neuro:    No dizziness or light headedness,   Musculoskeletal:  No recent active issues  Extremities:   No edema, good peripheral pulses      Objective:  Physical Exam  BP (!) 144/84   Pulse 84   Ht 5\' 7"  (1.702 m)   Wt 210 lb (95.3 kg)   BMI 32.89 kg/m   General:   Well developed, well nourished  Lungs:   Clear to auscultation  Heart:    Normal S1 S2, Slight murmur. no rubs, no gallops  Abdomen:   Soft, non tender, no organomegalies, positive bowel sounds  Extremities:   No edema, no cyanosis, good peripheral pulses  Neurological:   Awake, alert, oriented. No obvious focal deficits  Musculoskelatal:  No obvious deformities    Assessment:      Diagnosis Orders   1. Essential hypertension     2. Familial hypercholesterolemia     3. Coronary artery disease of bypass graft of native heart with stable angina pectoris (HCC)     4. Angina effort [facility](HCC)       Cardiac fair  Moderate risk patient    Plan:  No Follow-up on file.  Discussed clearance and risk   He understands it  Might need to consider  stenting of the circ after surgery done  If Surgery is urgent proceed  If not will consider stenting before     Continue risk factor modification and medical management  Thank you for allowing me to participate in the care of your patient. Please don't hesitate to contact me regarding any further issues related to the patient care    Orders  Placed:  No orders of the defined types were placed in this encounter.      Medications Prescribed:  No orders of the defined types were placed in this encounter.         Discussed use, benefit, and side effects of prescribed medications. All patient questions answered. Pt voiced understanding. Instructed to continue current medications, diet and exercise. Continue risk factor modification and medical management. Patient agreed with treatment plan. Follow up as directed.    Electronically signed by [name] Balboa, MD on 06/06/2016 at 11:59 AM

## 2016-06-08 ENCOUNTER — Encounter

## 2016-06-08 MED ORDER — LORAZEPAM 0.5 MG PO TABS
0.5 | ORAL_TABLET | Freq: Three times a day (TID) | ORAL | 0 refills | Status: DC | PRN
Start: 2016-06-08 — End: 2016-09-22

## 2016-06-08 NOTE — Telephone Encounter (Signed)
Lm for Beth to have Dr. Tonna CornerScharschmidt call Dr. Gabrielle DareBaki on his cell.  [phone]

## 2016-06-08 NOTE — Telephone Encounter (Signed)
Dr. Gabrielle DareBaki asking for Dr. Ilean ChinaMakwana to review chart for patient to be set up for PCI?

## 2016-06-08 NOTE — Telephone Encounter (Signed)
06/08/16   Micheal [name] called requesting a refill on the following medications:  Requested Prescriptions     Pending Prescriptions Disp Refills   . LORazepam (ATIVAN) 0.5 MG tablet 90 tablet 0     Sig: Take 1 tablet by mouth every 8 hours as needed for Anxiety for up to 30 days..     Pharmacy verified:  .[facility] [address]    Date of last visit:  05/19/16  Date of next visit (if applicable): 07/15/16  blm

## 2016-06-08 NOTE — Telephone Encounter (Signed)
Controlled Substances Monitoring:     The Prescription Monitoring Report for this patient was reviewed today. (Haly Feher D Tallan Sandoz, DO)    No signs of potential drug abuse or diversion identified. (Mohsin Crum D Shakiyla Kook, DO)

## 2016-06-08 NOTE — Telephone Encounter (Signed)
Please schedule office visit next week.

## 2016-06-09 NOTE — Telephone Encounter (Signed)
Appt made. Pt notified.

## 2016-06-13 ENCOUNTER — Encounter: Attending: Cardiovascular Disease | Primary: Family

## 2016-06-14 ENCOUNTER — Ambulatory Visit: Admit: 2016-06-14 | Discharge: 2016-06-14 | Payer: MEDICARE | Attending: Cardiovascular Disease | Primary: Family

## 2016-06-14 ENCOUNTER — Telehealth

## 2016-06-14 DIAGNOSIS — I25708 Atherosclerosis of coronary artery bypass graft(s), unspecified, with other forms of angina pectoris: Secondary | ICD-10-CM

## 2016-06-14 NOTE — Telephone Encounter (Signed)
I agree as above.

## 2016-06-14 NOTE — Progress Notes (Signed)
[facility]  [address]  [address]  [place] [id number]  Dept: [phone]  Dept Fax: [phone]  Loc: [phone]    Visit Date: 06/14/2016    [name] is a 61 y.o. male  who presented for:  Chief Complaint   Patient presents with   . Coronary Artery Disease     recent heart cath and needs PCI         Right      HPI:   HPI   The patient had back surgery pending.  However his workup indicated coronary artery disease for which he had cardiac catheterization.  Since his positive catheterization he will delay his back surgery and has opted for coronary intervention.  He reports episodes of chest pressure which is relieved with sublingual nitroglycerin.  He has no history of CVA, MI, CHF.  He is ready for PCI as soon as it's available.  He understands that his back surgery can be delayed for  6 months or longer.      Current Outpatient Prescriptions:   .  LORazepam (ATIVAN) 0.5 MG tablet, Take 1 tablet by mouth every 8 hours as needed for Anxiety for up to 30 days.., Disp: 90 tablet, Rfl: 0  .  oxyCODONE-acetaminophen (PERCOCET) 10-325 MG per tablet, Take 1 tablet by mouth every 8 hours as needed for Pain for up to 30 days.., Disp: 90 tablet, Rfl: 0  .  montelukast (SINGULAIR) 10 MG tablet, Take 1 tablet by mouth daily, Disp: 30 tablet, Rfl: 3  .  PARoxetine (PAXIL) 10 MG tablet, Take 1 tablet by mouth daily, Disp: 30 tablet, Rfl: 3  .  busPIRone (BUSPAR) 7.5 MG tablet, Take 1 tablet by mouth 2 times daily, Disp: 60 tablet, Rfl: 2  .  nitroGLYCERIN (NITROSTAT) 0.4 MG SL tablet, Place 1 tablet under the tongue every 5 minutes as needed for Chest pain, Disp: 25 tablet, Rfl: 2  .  aspirin 81 MG chewable tablet, Take 1 tablet by mouth daily, Disp: 30 tablet, Rfl: 0  .  isosorbide mononitrate (IMDUR) 30 MG extended release tablet, Take 1 tablet by mouth daily, Disp: 30 tablet, Rfl: 3  .  atorvastatin (LIPITOR) 80 MG tablet, Take 1 tablet by mouth nightly, Disp: 30  tablet, Rfl: 3  .  ticagrelor (BRILINTA) 90 MG TABS tablet, Take 1 tablet by mouth 2 times daily, Disp: 60 tablet, Rfl: 3  .  metoprolol succinate (TOPROL XL) 25 MG extended release tablet, Take 1 tablet by mouth daily, Disp: 30 tablet, Rfl: 3  .  glimepiride (AMARYL) 4 MG tablet, Take 1 tablet by mouth every morning, Disp: 30 tablet, Rfl: 3  .  fenofibrate (TRICOR) 145 MG tablet, TAKE 1 TABLET BY MOUTH DAILY, Disp: 30 tablet, Rfl: 5  .  esomeprazole (NEXIUM) 40 MG delayed release capsule, TAKE 1 CAPSULE BY MOUTH EVERY MORNING BEFORE BREAKFAST, Disp: 30 capsule, Rfl: 5  .  lisinopril (PRINIVIL;ZESTRIL) 40 MG tablet, TAKE 1 TABLET BY MOUTH DAILY, Disp: 30 tablet, Rfl: 5  .  metFORMIN (GLUCOPHAGE) 1000 MG tablet, Take 1 tablet by mouth 2 times daily (with meals), Disp: 180 tablet, Rfl: 3  .  hydrocortisone 2.5 % cream, Apply to external ear BID PRN, Disp: 1 Tube, Rfl: 2  .  olopatadine (PATANOL) 0.1 % ophthalmic solution, Place 1 drop into both eyes 2 times daily, Disp: 1 Bottle, Rfl: 5  .  fluticasone (FLONASE) 50 MCG/ACT nasal spray,  2 sprays by Nasal route daily, Disp: 1 Bottle, Rfl: 5  .  Handicap Placard MISC, by Does not apply route Expires 03/01/2020, Disp: 1 each, Rfl: 0  .  Multiple Vitamins-Minerals (CENTRUM SILVER ULTRA MENS) TABS, Take 1 tablet by mouth daily, Disp: , Rfl:     Past Medical History  Micheal [name]  has a past medical history of CAD (coronary artery disease); Diabetes mellitus (HCC); Hyperlipidemia; and Hypertension.    Social History  Micheal [name]  reports that he quit smoking about 8 years ago. His smoking use included Cigarettes. He has never used smokeless tobacco. He reports that he drinks about 7.2 oz of alcohol per week . He reports that he does not use drugs.    Past Surgical History   Past Surgical History:   Procedure Laterality Date   . CORONARY ARTERY BYPASS GRAFT     . ORTHOPEDIC SURGERY Left        Subjective:     Review of Systems  Constitutional: No weight loss, fever, night sweats.    Cardiovascular: Negative for chest pain, claudication, cyanosis, exertional chest pain or pressure, irregular heart beat, lower extremity edema, orthopnea, paroxysmal nocturnal dyspnea and varicose veins.   Neck: Negative for JVD, carotid bruit, and swelling of neck.  Trachea central.    Respiratory: Negative for cough, dyspnea on exertion, emphysema, hemoptysis, pleurisy/chest pain, sputum and wheezing.   Limb: Negative for cyanosis, clubbing, edema, and focal power deficit.      Objective:     BP 134/78 (Site: Left Arm)   Pulse 80   Ht 5\' 7"  (1.702 m)   Wt 205 lb (93 kg)   BMI 32.11 kg/m     Wt Readings from Last 3 Encounters:   06/14/16 205 lb (93 kg)   06/06/16 210 lb (95.3 kg)   05/19/16 215 lb (97.5 kg)     BP Readings from Last 3 Encounters:   06/14/16 134/78   06/06/16 128/74   05/19/16 128/76       Physical Exam  General Appearance: Patient is alert, well appearing, and in no distress.    Mental status: Alert, oriented to person, place, and time.  Answers questions appropriately.   Neck:  Supple, no significant adenopathy, no JVD, or carotid bruits.   Chest:  Clear to auscultation, no wheezes, rales or rhonchi, symmetric air entry bilaterally.   Heart:  Normal rate, regular rhythm, normal S1, S2, no murmurs, rubs, clicks or gallops.   Abdomen:  Soft, nontender,  no masses ,pulsation or organomegaly.   Neurological: Alert, oriented, normal speech, no focal findings or movement disorder noted.   Musculoskeletal:  No joint tenderness, deformity or swelling.   Extremities:  Peripheral pulses normal, no pedal edema, no clubbing or cyanosis.   Skin: Normal coloration and turgor, no rashes, no suspicious skin lesions noted.     Testing Reviewed:        Assessment/Plan   1. Symptomatic coronary artery disease.  2. Diabetes mellitus and lipid disorder.  3. Patient has opted for elective coronary intervention due to symptoms.  He understands that he is back surgery will be delayed as a result of PCI.  He is  available for PCI as soon as it can be scheduled                 All patient questions answered. Pt voiced understanding.Patient agreed with treatment plan.                Electronically signed  by Elmer Sow, MD FACC,FASE,FSVM,FSCAI,FASNC,FAHA,FACP.  06/14/2016 at 9:33 AM

## 2016-06-14 NOTE — Progress Notes (Signed)
Patient had recent heart cath and needs PCI.  He has chest pressure without any radiation.  He has SOB with exertion.  He denies having any dizziness, lightheadedness, palpitations or LEE.

## 2016-06-14 NOTE — Telephone Encounter (Signed)
V.O. Dr. Ilean ChinaMakwana- Please schedule patient for PCI.    Orders given to scheduling.   Please agree to verbal order.

## 2016-06-16 ENCOUNTER — Encounter

## 2016-06-16 MED ORDER — OXYCODONE-ACETAMINOPHEN 10-325 MG PO TABS
10-325 | ORAL_TABLET | Freq: Three times a day (TID) | ORAL | 0 refills | Status: DC | PRN
Start: 2016-06-16 — End: 2016-07-14

## 2016-06-16 NOTE — Telephone Encounter (Signed)
06/16/16   Micheal [name] called requesting a refill on the following medications:  Requested Prescriptions     Pending Prescriptions Disp Refills   . oxyCODONE-acetaminophen (PERCOCET) 10-325 MG per tablet 90 tablet 0     Sig: Take 1 tablet by mouth every 8 hours as needed for Pain for up to 30 days..     Pharm[facility]: [facility] [address]  .pv      Date of last visit:  05/19/16  Date of next visit (if applicable): 07/15/16  blm

## 2016-06-16 NOTE — Telephone Encounter (Signed)
Request is 2 days early. Ok to fill?

## 2016-06-16 NOTE — Telephone Encounter (Signed)
Controlled Substances Monitoring:     The Prescription Monitoring Report for this patient was reviewed today. (Juandavid Dallman D Dino Borntreger, DO)    No signs of potential drug abuse or diversion identified. (Donevan Biller D Briana Farner, DO)

## 2016-06-30 ENCOUNTER — Ambulatory Visit: Admit: 2016-06-30 | Discharge: 2016-06-30 | Payer: MEDICARE | Attending: Clinical | Primary: Family

## 2016-06-30 DIAGNOSIS — F410 Panic disorder [episodic paroxysmal anxiety] without agoraphobia: Secondary | ICD-10-CM

## 2016-06-30 NOTE — Patient Instructions (Signed)
1.  Try going to bed about 10:00p to get better sleep.    2.  Continue to get outside and get some sunlight.      3.  Return to see Dr. Ivy LynnBrickner in 4-6 weeks.

## 2016-06-30 NOTE — Progress Notes (Signed)
Behavioral Health Consultation  Micheal HellerKurt A Lonn Im, PsyD  Psychologist  06/30/2016  1:25 PM      Time spent with Patient:  30 minutes  [facility]    Reason for Consult:  Panic attacks  Referring Provider: Cecille RubinMark D Kahle, DO  [address]  [place], [place] [id number]    Feedback given to PCP.    S:  Pt said he's had no panic attacks in the past month.  Pt said he took himself off the Ativan, but he's still taking the Paxil and Buspar.  He said just talking about the panic attacks, some med ed, and coping mechanisms last time were helpful.  He said he's been sleeping about 6 hours a night.  He said he's been feeling better now that he's able to ride his motorcycle again.  He said his concentration on simple tasks has improved as well.  He feels really good now, and is looking forward to continue to maintain good mental health.     O:  MSE:    Appearance    alert, cooperative, no distress  Appetite normal  Sleep disturbance No  Loss of pleasure No  Speech    spontaneous, normal rate, normal volume and well articulated  Mood    Euthymic, pleasant  Affect    normal  Thought Content    intact  Insight    Fair  Judgment    Intact  Suicide Assessment    no suicidal ideation    History:    Medications:   Current Outpatient Prescriptions   Medication Sig Dispense Refill   . oxyCODONE-acetaminophen (PERCOCET) 10-325 MG per tablet Take 1 tablet by mouth every 8 hours as needed for Pain for up to 30 days.. 90 tablet 0   . LORazepam (ATIVAN) 0.5 MG tablet Take 1 tablet by mouth every 8 hours as needed for Anxiety for up to 30 days.. 90 tablet 0   . montelukast (SINGULAIR) 10 MG tablet Take 1 tablet by mouth daily 30 tablet 3   . PARoxetine (PAXIL) 10 MG tablet Take 1 tablet by mouth daily 30 tablet 3   . busPIRone (BUSPAR) 7.5 MG tablet Take 1 tablet by mouth 2 times daily 60 tablet 2   . nitroGLYCERIN (NITROSTAT) 0.4 MG SL tablet Place 1 tablet under the tongue every 5 minutes as needed for Chest pain 25 tablet 2    . aspirin 81 MG chewable tablet Take 1 tablet by mouth daily 30 tablet 0   . isosorbide mononitrate (IMDUR) 30 MG extended release tablet Take 1 tablet by mouth daily 30 tablet 3   . atorvastatin (LIPITOR) 80 MG tablet Take 1 tablet by mouth nightly 30 tablet 3   . ticagrelor (BRILINTA) 90 MG TABS tablet Take 1 tablet by mouth 2 times daily 60 tablet 3   . metoprolol succinate (TOPROL XL) 25 MG extended release tablet Take 1 tablet by mouth daily 30 tablet 3   . glimepiride (AMARYL) 4 MG tablet Take 1 tablet by mouth every morning 30 tablet 3   . fenofibrate (TRICOR) 145 MG tablet TAKE 1 TABLET BY MOUTH DAILY 30 tablet 5   . esomeprazole (NEXIUM) 40 MG delayed release capsule TAKE 1 CAPSULE BY MOUTH EVERY MORNING BEFORE BREAKFAST 30 capsule 5   . lisinopril (PRINIVIL;ZESTRIL) 40 MG tablet TAKE 1 TABLET BY MOUTH DAILY 30 tablet 5   . metFORMIN (GLUCOPHAGE) 1000 MG tablet Take 1 tablet by mouth 2 times daily (with  meals) 180 tablet 3   . hydrocortisone 2.5 % cream Apply to external ear BID PRN 1 Tube 2   . olopatadine (PATANOL) 0.1 % ophthalmic solution Place 1 drop into both eyes 2 times daily 1 Bottle 5   . fluticasone (FLONASE) 50 MCG/ACT nasal spray 2 sprays by Nasal route daily 1 Bottle 5   . Handicap Placard MISC by Does not apply route Expires 03/01/2020 1 each 0   . Multiple Vitamins-Minerals (CENTRUM SILVER ULTRA MENS) TABS Take 1 tablet by mouth daily       No current facility-administered medications for this visit.        Social History:   Social History     Social History   . Marital status: Married     Spouse name: N/A   . Number of children: N/A   . Years of education: N/A     Occupational History   . Not on file.     Social History Main Topics   . Smoking status: Former Smoker     Types: Cigarettes     Quit date: 05/24/2008   . Smokeless tobacco: Never Used   . Alcohol use 7.2 oz/week     12 Cans of beer per week      Comment: 12 beers daily   . Drug use: No   . Sexual activity: Yes     Other Topics  Concern   . Not on file     Social History Narrative   . No narrative on file       TOBACCO:   reports that he quit smoking about 8 years ago. His smoking use included Cigarettes. He has never used smokeless tobacco.  ETOH:   reports that he drinks about 7.2 oz of alcohol per week .    Family History:   Family History   Problem Relation Age of Onset   . Cancer Mother    . Heart Disease Brother    . Stroke Brother    . Diabetes Brother    . Early Death Brother    . High Blood Pressure Brother    . High Cholesterol Brother    . Mental Retardation Sister            A:  Pt has been able to get outside more, ride his motorcycle, and has stopped taking the Ativan.  He said he's had no panic attacks in the past month and feels much happier about things.  Monitor again in a month.    Diagnosis:    Panic disorder without agoraphobia      Diagnosis Date   . CAD (coronary artery disease)    . Diabetes mellitus (HCC)    . Hyperlipidemia    . Hypertension      Problems with primary support group, Problems related to the social environment and Other psychosocial and environmental problems      Plan:  Pt interventions:  Discussed and problem-solved barriers in adhering to behavioral change plan, Motivational Interviewing to increase patient confidence and compliance with adhering to behavioral change plan, Motivational Interviewing to determine importance and readiness for change, Discussed potential barriers to change, Agenda-setting to identify pt's primary goals for [facility] visit / overall health, Supportive techniques,      Pt Behavioral Change Plan:  1.  Try going to bed about 10:00p to get better sleep.    2.  Continue to get outside and get some sunlight.      3.  Return  to see Micheal [name] in 4-6 weeks.

## 2016-07-04 ENCOUNTER — Inpatient Hospital Stay: Admit: 2016-07-04 | Discharge: 2016-07-08 | Payer: MEDICARE | Attending: Interventional Cardiology | Primary: Family

## 2016-07-04 ENCOUNTER — Non-Acute Institutional Stay: Admit: 2016-07-04 | Payer: MEDICARE | Primary: Family

## 2016-07-04 DIAGNOSIS — I25110 Atherosclerotic heart disease of native coronary artery with unstable angina pectoris: Secondary | ICD-10-CM

## 2016-07-04 LAB — TYPE AND SCREEN
Antibody Screen: NEGATIVE
Rh Factor: POSITIVE

## 2016-07-04 LAB — PROTIME-INR: INR: 1.01 (ref 0.85–1.13)

## 2016-07-04 LAB — CBC
Hematocrit: 40.1 % — ABNORMAL LOW (ref 42.0–52.0)
Hemoglobin: 13.5 gm/dl — ABNORMAL LOW (ref 14.0–18.0)
MCH: 29.5 pg (ref 27.0–31.0)
MCHC: 33.7 gm/dl (ref 33.0–37.0)
MCV: 87.6 fL (ref 80.0–94.0)
MPV: 9.4 fL (ref 7.4–10.4)
Platelets: 261 10*3/uL (ref 130–400)
RBC: 4.58 10*6/uL — ABNORMAL LOW (ref 4.70–6.10)
RDW: 13.9 % (ref 11.5–14.5)
WBC: 7.5 10*3/uL (ref 4.8–10.8)

## 2016-07-04 LAB — BASIC METABOLIC PANEL W/ REFLEX TO MG FOR LOW K
BUN: 21 mg/dL (ref 7–22)
CO2: 21 meq/L — ABNORMAL LOW (ref 23–33)
Calcium: 9.5 mg/dL (ref 8.5–10.5)
Chloride: 102 meq/L (ref 98–111)
Creatinine: 1.2 mg/dL (ref 0.4–1.2)
Glucose: 89 mg/dL (ref 70–108)
Potassium reflex Magnesium: 4 meq/L (ref 3.5–5.2)
Sodium: 139 meq/L (ref 135–145)

## 2016-07-04 LAB — GLOMERULAR FILTRATION RATE, ESTIMATED: Est, Glom Filt Rate: 61 mL/min/{1.73_m2} — AB

## 2016-07-04 LAB — ANION GAP: Anion Gap: 16 meq/L (ref 8.0–16.0)

## 2016-07-04 LAB — APTT: aPTT: 26.8 seconds (ref 22.0–38.0)

## 2016-07-04 MED ORDER — NORMAL SALINE FLUSH 0.9 % IV SOLN
0.9 % | Freq: Two times a day (BID) | INTRAVENOUS | Status: DC
Start: 2016-07-04 — End: 2016-07-04

## 2016-07-04 MED ORDER — SODIUM CHLORIDE 0.9 % IV SOLN
0.9 % | INTRAVENOUS | Status: DC
Start: 2016-07-04 — End: 2016-07-04
  Administered 2016-07-04: 18:00:00 via INTRAVENOUS

## 2016-07-04 MED ORDER — NORMAL SALINE FLUSH 0.9 % IV SOLN
0.9 % | INTRAVENOUS | Status: DC | PRN
Start: 2016-07-04 — End: 2016-07-04

## 2016-07-04 MED ORDER — ACETAMINOPHEN 325 MG PO TABS
325 MG | ORAL | Status: DC | PRN
Start: 2016-07-04 — End: 2016-07-05

## 2016-07-04 MED ORDER — LIDOCAINE HCL 2 % IJ SOLN
2 | INTRAMUSCULAR | Status: AC
Start: 2016-07-04 — End: 2016-07-04

## 2016-07-04 MED ORDER — TICAGRELOR 90 MG PO TABS
90 | ORAL | Status: AC
Start: 2016-07-04 — End: 2016-07-04

## 2016-07-04 MED ORDER — NITROGLYCERIN 0.4 MG SL SUBL
0.4 MG | SUBLINGUAL | Status: DC | PRN
Start: 2016-07-04 — End: 2016-07-04

## 2016-07-04 MED ORDER — NORMAL SALINE FLUSH 0.9 % IV SOLN
0.9 % | Freq: Two times a day (BID) | INTRAVENOUS | Status: DC
Start: 2016-07-04 — End: 2016-07-05

## 2016-07-04 MED ORDER — ONDANSETRON HCL 4 MG/2ML IJ SOLN
4 MG/2ML | Freq: Four times a day (QID) | INTRAMUSCULAR | Status: DC | PRN
Start: 2016-07-04 — End: 2016-07-05

## 2016-07-04 MED ORDER — NORMAL SALINE FLUSH 0.9 % IV SOLN
0.9 % | INTRAVENOUS | Status: DC | PRN
Start: 2016-07-04 — End: 2016-07-05

## 2016-07-04 MED ORDER — ASPIRIN 325 MG PO TABS
325 MG | Freq: Once | ORAL | Status: DC
Start: 2016-07-04 — End: 2016-07-04

## 2016-07-04 MED ORDER — NITROGLYCERIN 500 MCG / 10 ML VIAL
Status: AC
Start: 2016-07-04 — End: 2016-07-04

## 2016-07-04 MED ORDER — MIDAZOLAM HCL 2 MG/2ML IJ SOLN
2 | INTRAMUSCULAR | Status: AC
Start: 2016-07-04 — End: 2016-07-04

## 2016-07-04 MED ORDER — FENTANYL CITRATE (PF) 100 MCG/2ML IJ SOLN
100 | INTRAMUSCULAR | Status: AC
Start: 2016-07-04 — End: 2016-07-04

## 2016-07-04 MED ORDER — HEPARIN SODIUM (PORCINE) 1000 UNIT/ML IJ SOLN
1000 | INTRAMUSCULAR | Status: AC
Start: 2016-07-04 — End: 2016-07-04

## 2016-07-04 MED ORDER — MAGNESIUM HYDROXIDE 400 MG/5ML PO SUSP
400 MG/5ML | Freq: Every day | ORAL | Status: DC | PRN
Start: 2016-07-04 — End: 2016-07-05

## 2016-07-04 MED ORDER — SODIUM CHLORIDE 0.9 % IV SOLN
0.9 % | INTRAVENOUS | Status: DC
Start: 2016-07-04 — End: 2016-07-05
  Administered 2016-07-05: 02:00:00 75 mL/h via INTRAVENOUS

## 2016-07-04 MED ORDER — BIVALIRUDIN TRIFLUOROACETATE 250 MG IV SOLR
250 | INTRAVENOUS | Status: AC
Start: 2016-07-04 — End: 2016-07-04

## 2016-07-04 MED ORDER — VERAPAMIL HCL 2.5 MG/ML IV SOLN
2.5 | INTRAVENOUS | Status: AC
Start: 2016-07-04 — End: 2016-07-04

## 2016-07-04 MED FILL — FENTANYL CITRATE (PF) 100 MCG/2ML IJ SOLN: 100 MCG/2ML | INTRAMUSCULAR | Qty: 2

## 2016-07-04 MED FILL — BRILINTA 90 MG PO TABS: 90 MG | ORAL | Qty: 1

## 2016-07-04 MED FILL — BIVALIRUDIN TRIFLUOROACETATE 250 MG IV SOLR: 250 MG | INTRAVENOUS | Qty: 250

## 2016-07-04 MED FILL — MIDAZOLAM HCL 2 MG/2ML IJ SOLN: 2 MG/ML | INTRAMUSCULAR | Qty: 2

## 2016-07-04 MED FILL — HEPARIN SODIUM (PORCINE) 1000 UNIT/ML IJ SOLN: 1000 UNIT/ML | INTRAMUSCULAR | Qty: 20

## 2016-07-04 MED FILL — VERAPAMIL HCL 2.5 MG/ML IV SOLN: 2.5 MG/ML | INTRAVENOUS | Qty: 2

## 2016-07-04 MED FILL — NITROGLYCERIN 500 MCG / 10 ML VIAL: Qty: 1

## 2016-07-04 MED FILL — LIDOCAINE HCL 2 % IJ SOLN: 2 % | INTRAMUSCULAR | Qty: 20

## 2016-07-04 NOTE — Other (Signed)
Took home doses of buspar and singular

## 2016-07-04 NOTE — Other (Deleted)
Received from cath lab. Right groin stable. Pt aware to keep right leg still and not to cross legs and to keep head down. 0.9 normal saline cont. Resting with easy resp. Denies pain or needs.

## 2016-07-04 NOTE — Plan of Care (Signed)
Problem: Discharge Planning:  Goal: Participates in care planning  Participates in care planning   Outcome: Ongoing  Patient actively participates in discharge planning.  Goal: Discharged to appropriate level of care  Discharged to appropriate level of care   Outcome: Ongoing  Patient plans to return home at discharge. Patient will not need assistance at home.      Problem: Airway Clearance - Ineffective:  Goal: Ability to maintain a clear airway will improve  Ability to maintain a clear airway will improve   Outcome: Ongoing  Patient maintains patent airway this shift.     Problem: Tissue Perfusion - Cardiopulmonary, Altered:  Goal: Absence of angina  Absence of angina   Outcome: Ongoing  Patient is free of chest pain. Troponin levels and EKG monitors being monitored. Pulse oximetry being monitored every four hours.    Goal: Hemodynamic stability will improve  Hemodynamic stability will improve   Outcome: Ongoing  Blood pressure WNL. Vitals monitored and recorded. Patient hemodynamically stable. Cardiac monitor in place with strips being read every four hours.      Problem: Bleeding - Risk of  Goal: Absence of active bleeding  Outcome: Ongoing  No bleeding noted this shift.    Comments: Care plan reviewed with patient.  Patient verbalizes understanding of the plan of care and contribute to goal setting.

## 2016-07-04 NOTE — Plan of Care (Signed)
Problem: Discharge Planning:  Goal: Participates in care planning  Participates in care planning   Outcome: Met This Shift    Goal: Discharged to appropriate level of care  Discharged to appropriate level of care   Outcome: Met This Shift  Transferred to 8b    Problem: Airway Clearance - Ineffective:  Goal: Ability to maintain a clear airway will improve  Ability to maintain a clear airway will improve   Outcome: Met This Shift      Problem: Tissue Perfusion - Cardiopulmonary, Altered:  Goal: Absence of angina  Absence of angina   Outcome: Met This Shift    Goal: Hemodynamic stability will improve  Hemodynamic stability will improve   Outcome: Met This Shift      Problem: Bleeding - Risk of  Goal: Absence of active bleeding  Outcome: Met This Shift

## 2016-07-04 NOTE — Progress Notes (Signed)
[facility]  Sedation/Analgesia History & Physical      Pt Name: Micheal [name]  MRN: [id number]  Date of Birth: 13-Dec-1955  Provider Performing Procedure: Virgilio Frees MD, Emmie Niemann, [facility]  Primary Care Physician: [name], DO      PRE-PROCEDURE   DNR-CCA/DNR-CC [] Yes [x] No      PLANNED PROCEDURE     [x] Cath  [x] PCI                [] Pacemaker/AICD  [] TEE             [] Cardioversion [] Peripheral angiography/PTA  [] Other:        Consent:   The indication, risks and benefits of the procedure and possible therapeutic consequences and alternatives were discussed with the patient.   The patient was given the opportunity to ask questions and to have them answered to his/her satisfaction. Risks of the procedure include but are not limited to the following: Bleeding, hematoma including retroperitoneal hematoma, infection, pain and discomfort, injury to the aorta and other blood vessels, rhythm disturbance, low blood pressure, myocardial infarction, stroke, kidney damage/failure, myocardial perforation, allergic reactions to sedatives/contrast material, loss of pulse/vascular compromise requiring surgery, aneurysm/pseudoaneurysm formation, possible loss of a limb/hand/leg, death. Alternatives to and omission of the suggested procedure were discussed. The patient had no further questions and wished to proceed; the consent form was signed.      Indications for the Procedure:   Angina III        MEDICAL HISTORY        Past Medical History:   Diagnosis Date   . CAD (coronary artery disease)    . Diabetes mellitus (HCC)    . Hyperlipidemia    . Hypertension          Past Surgical History:   Procedure Laterality Date   . CORONARY ARTERY BYPASS GRAFT     . ORTHOPEDIC SURGERY Left            No Known Allergies      MEDICATIONS   Coumadin Use Last 5 Days [x] No [] Yes  Antiplatelet drug therapy use last 5 days  [] No [x] Yes  Other anticoagulant use last 5 days  [x] No [] Yes      No current facility-administered  medications on file prior to encounter.      Current Outpatient Prescriptions on File Prior to Encounter   Medication Sig Dispense Refill   . oxyCODONE-acetaminophen (PERCOCET) 10-325 MG per tablet Take 1 tablet by mouth every 8 hours as needed for Pain for up to 30 days.. 90 tablet 0   . LORazepam (ATIVAN) 0.5 MG tablet Take 1 tablet by mouth every 8 hours as needed for Anxiety for up to 30 days.. 90 tablet 0   . montelukast (SINGULAIR) 10 MG tablet Take 1 tablet by mouth daily 30 tablet 3   . PARoxetine (PAXIL) 10 MG tablet Take 1 tablet by mouth daily 30 tablet 3   . busPIRone (BUSPAR) 7.5 MG tablet Take 1 tablet by mouth 2 times daily 60 tablet 2   . nitroGLYCERIN (NITROSTAT) 0.4 MG SL tablet Place 1 tablet under the tongue every 5 minutes as needed for Chest pain 25 tablet 2   . aspirin 81 MG chewable tablet Take 1 tablet by mouth daily 30 tablet 0   . isosorbide mononitrate (IMDUR) 30 MG extended release tablet Take 1 tablet by mouth daily 30 tablet 3   . atorvastatin (LIPITOR) 80 MG tablet Take 1 tablet by  mouth nightly 30 tablet 3   . ticagrelor (BRILINTA) 90 MG TABS tablet Take 1 tablet by mouth 2 times daily 60 tablet 3   . metoprolol succinate (TOPROL XL) 25 MG extended release tablet Take 1 tablet by mouth daily 30 tablet 3   . glimepiride (AMARYL) 4 MG tablet Take 1 tablet by mouth every morning 30 tablet 3   . fenofibrate (TRICOR) 145 MG tablet TAKE 1 TABLET BY MOUTH DAILY 30 tablet 5   . esomeprazole (NEXIUM) 40 MG delayed release capsule TAKE 1 CAPSULE BY MOUTH EVERY MORNING BEFORE BREAKFAST 30 capsule 5   . lisinopril (PRINIVIL;ZESTRIL) 40 MG tablet TAKE 1 TABLET BY MOUTH DAILY 30 tablet 5   . metFORMIN (GLUCOPHAGE) 1000 MG tablet Take 1 tablet by mouth 2 times daily (with meals) 180 tablet 3   . hydrocortisone 2.5 % cream Apply to external ear BID PRN 1 Tube 2   . olopatadine (PATANOL) 0.1 % ophthalmic solution Place 1 drop into both eyes 2 times daily 1 Bottle 5   . fluticasone (FLONASE) 50  MCG/ACT nasal spray 2 sprays by Nasal route daily 1 Bottle 5   . Handicap Placard MISC by Does not apply route Expires 03/01/2020 1 each 0   . Multiple Vitamins-Minerals (CENTRUM SILVER ULTRA MENS) TABS Take 1 tablet by mouth daily         Current Facility-Administered Medications   Medication Dose Route Frequency Provider Last Rate Last Dose   . 0.9 % sodium chloride infusion   Intravenous Continuous Jeanella Crazearcy C Schroeder, PA-C       . aspirin tablet 325 mg  325 mg Oral Once PPL CorporationDarcy C Schroeder, PA-C       . nitroGLYCERIN (NITROSTAT) SL tablet 0.4 mg  0.4 mg Sublingual Q5 Min PRN Jeanella Crazearcy C Schroeder, PA-C       . sodium chloride flush 0.9 % injection 10 mL  10 mL Intravenous 2 times per day Jeanella Crazearcy C Schroeder, PA-C       . sodium chloride flush 0.9 % injection 10 mL  10 mL Intravenous PRN Jeanella Crazearcy C Schroeder, PA-C                 PHYSICAL:     BP 92/60 Comment: left 93/59 right  Pulse 84   Temp 97.8 F (36.6 C) (Oral)   Resp 22   SpO2 95%     Heart:  [x] Regular rate and rhythm  [] Other:    Lungs:  [x] Clear    [] Other:    Abdomen: [x] Soft    [] Other:    Mental Status: [x] Alert & Oriented  [] Other:   Ext:                [x] No edema       [] Other:         No results for input(s): CKTOTAL, CKMB, CKMBINDEX, TROPONINI in the last 72 hours.    Lab Results   Component Value Date    WBC 7.1 05/06/2016    RBC 4.47 05/06/2016    HGB 13.1 05/06/2016    HCT 39.4 05/06/2016    MCV 88.1 05/06/2016    MCH 29.2 05/06/2016    MCHC 33.2 05/06/2016    RDW 13.4 05/06/2016    PLT 224 05/06/2016    MPV 9.6 05/06/2016       Lab Results   Component Value Date    NA 138 05/31/2016    K 4.4 05/31/2016    K 4.2 05/06/2016  CL 100 05/31/2016    CO2 25 05/31/2016    BUN 18 05/31/2016    LABALBU 4.7 05/31/2016    CREATININE 1.0 05/31/2016    CALCIUM 9.7 05/31/2016    LABGLOM 76 05/31/2016    GLUCOSE 102 05/31/2016       Lab Results   Component Value Date    ALKPHOS 36 05/31/2016    ALT 41 05/31/2016    AST 27 05/31/2016    PROT 8.5 05/31/2016     BILITOT 0.5 05/31/2016    LABALBU 4.7 05/31/2016       No results found for: MG    No components found for: Haskel Khan    Lab Results   Component Value Date    LABA1C 8.5 01/13/2016       Lab Results   Component Value Date    TRIG 168 05/31/2016    HDL 31 05/31/2016    LDLCALC 91 05/31/2016       Lab Results   Component Value Date    TSH 3.000 09/03/2014            SEDATION  Planned agent:[x] Midazolam [] Meperidine [x] Sublimaze [] Morphine [] Diazepam  [] Other:         ASA Classification:  [] 1 [x] 2 [] 3 [] 4 [] 5  Class 1: A normal healthy patient  Class 2: Pt with mild to moderate systemic disease  Class 3: Severe systemic disease or disturbance  Class 4: Severe systemic disorders that are already life threatening.  Class 5: Moribund pt with little chances of survival, for more than 24 hours.    Mallampati I Airway Classification:   [] 1 [x] 2 [] 3 [] 4      [x] Pre-procedure diagnostic studies complete and results available.   Comment:    [x] Previous sedation/anesthesia experiences assessed.   Comment:    [x] The patient is an appropriate candidate to undergo the planned procedure sedation and anesthesia. (Refer to nursing sedation/analgesia documentation record)  [x] Formulation and discussion of sedation/procedure plan, risks, and expectations with patient and/or responsible adult completed.  [x] Patient examined immediately prior to the procedure. (Refer to nursing sedation/analgesia documentation record)        Virgilio Frees MD, Emmie Niemann, FCCP  Electronically signed 07/04/2016 at 1:49 PM

## 2016-07-04 NOTE — Other (Signed)
angiomax infusion completed.

## 2016-07-04 NOTE — Other (Signed)
Received from cath lab. Right wrist stable. vasc band and armboard cont. Pt aware to keep right arm still, not to lift, push or pull with it. 0.9 normal saline cont. angiomax cont. Resting with easy resp. Denies pain or needs. Denies pain or needs. Dr Ilean ChinaMakwana visit.

## 2016-07-04 NOTE — Op Note (Signed)
[facility]  Sedation/Analgesia Post Sedation Record        Pt Name: Micheal [name]  MRN: [id number]  Date of Birth: July 20, 1955  Procedure Performed By: Virgilio FreesHemraj R Lilley Hubble  MD, Emmie NiemannFACC, FSCAI, [facility]CCP  Primary Care Physician: Cecille RubinMark D Kahle, DO        POST-PROCEDURE    Physicians/Assistants: Virgilio FreesHemraj R Marten Iles MD, FACC, FSCAI, FCCP    Procedure Performed:  PCI to Lcx and RCA                                  Sedation/Anesthesia:  Local Anesthesia and IV Conscious Sedation with continuous O2 monitoring    Estimated Blood Loss: 10 cc     Specimens Removed:  [x] None [] Other:      Disposition of Specimen:  [] Pathology [] Other        Complications:   [x] None Immediate [] Other:       Post Procedure Diagnosis/Findings:  Coronary Artery Disease          Recommendations:  Medical treatment and review films.               Rashika Bettes Verna Czech Cypress Hinkson MD, Emmie NiemannFACC, FSCAI, FCCP  Electronically signed 07/04/2016 at 4:40 PM

## 2016-07-04 NOTE — Other (Signed)
Report called to 8b staff.

## 2016-07-04 NOTE — Other (Signed)
Patient admitted to 2E05  Ambulatory for ptca.  Patient NPO.   Vital signs obtained.   Assessment and data collection intiated.   Oriented to room.  Policies and procedures for 2E explained.   All questions answered with no further questions at this time.   Fall prevention and safety precautions discussed with patient.

## 2016-07-04 NOTE — Plan of Care (Signed)
Problem: Discharge Planning:  Goal: Participates in care planning  Participates in care planning  Outcome: Ongoing    Goal: Discharged to appropriate level of care  Discharged to appropriate level of care  Outcome: Ongoing      Problem: Airway Clearance - Ineffective:  Goal: Ability to maintain a clear airway will improve  Ability to maintain a clear airway will improve  Outcome: Ongoing      Problem: Tissue Perfusion - Cardiopulmonary, Altered:  Goal: Absence of angina  Absence of angina  Outcome: Ongoing    Goal: Hemodynamic stability will improve  Hemodynamic stability will improve  Outcome: Ongoing      Problem: Bleeding - Risk of  Goal: Absence of active bleeding  Outcome: Ongoing

## 2016-07-05 LAB — EKG 12-LEAD
Atrial Rate: 69 {beats}/min
Atrial Rate: 79 {beats}/min
P Axis: 54 degrees
P Axis: 61 degrees
P-R Interval: 150 ms
P-R Interval: 174 ms
Q-T Interval: 374 ms
Q-T Interval: 408 ms
QRS Duration: 90 ms
QRS Duration: 92 ms
QTc Calculation (Bazett): 428 ms
QTc Calculation (Bazett): 437 ms
R Axis: 18 degrees
R Axis: 27 degrees
T Axis: 1 degrees
T Axis: 29 degrees
Ventricular Rate: 69 {beats}/min
Ventricular Rate: 79 {beats}/min

## 2016-07-05 LAB — POCT GLUCOSE: POC Glucose: 95 mg/dl (ref 70–108)

## 2016-07-05 MED ORDER — METOPROLOL SUCCINATE ER 25 MG PO TB24
25 MG | Freq: Every day | ORAL | Status: DC
Start: 2016-07-05 — End: 2016-07-05
  Administered 2016-07-05: 12:00:00 25 mg via ORAL

## 2016-07-05 MED ORDER — MONTELUKAST SODIUM 10 MG PO TABS
10 MG | Freq: Every day | ORAL | Status: DC
Start: 2016-07-05 — End: 2016-07-05

## 2016-07-05 MED ORDER — ESOMEPRAZOLE MAGNESIUM 40 MG PO CPDR
40 MG | Freq: Every day | ORAL | Status: DC
Start: 2016-07-05 — End: 2016-07-05
  Administered 2016-07-05: 09:00:00 40 mg via ORAL

## 2016-07-05 MED ORDER — LORAZEPAM 0.5 MG PO TABS
0.5 MG | Freq: Three times a day (TID) | ORAL | Status: DC | PRN
Start: 2016-07-05 — End: 2016-07-05

## 2016-07-05 MED ORDER — GLIMEPIRIDE 4 MG PO TABS
4 MG | Freq: Every morning | ORAL | Status: DC
Start: 2016-07-05 — End: 2016-07-05
  Administered 2016-07-05: 12:00:00 4 mg via ORAL

## 2016-07-05 MED ORDER — ISOSORBIDE MONONITRATE ER 30 MG PO TB24
30 MG | Freq: Every day | ORAL | Status: DC
Start: 2016-07-05 — End: 2016-07-05
  Administered 2016-07-05: 12:00:00 30 mg via ORAL

## 2016-07-05 MED ORDER — BUSPIRONE HCL 5 MG PO TABS
5 MG | Freq: Two times a day (BID) | ORAL | Status: DC
Start: 2016-07-05 — End: 2016-07-05
  Administered 2016-07-05: 12:00:00 7.5 mg via ORAL

## 2016-07-05 MED ORDER — METFORMIN HCL 500 MG PO TABS
500 MG | Freq: Two times a day (BID) | ORAL | Status: DC
Start: 2016-07-05 — End: 2016-07-04

## 2016-07-05 MED ORDER — OLOPATADINE HCL 0.1 % OP SOLN
0.1 % | Freq: Two times a day (BID) | OPHTHALMIC | Status: DC
Start: 2016-07-05 — End: 2016-07-05

## 2016-07-05 MED ORDER — OXYCODONE-ACETAMINOPHEN 10-325 MG PO TABS
10-325 MG | Freq: Three times a day (TID) | ORAL | Status: DC | PRN
Start: 2016-07-05 — End: 2016-07-05

## 2016-07-05 MED ORDER — LISINOPRIL 40 MG PO TABS
40 MG | Freq: Every day | ORAL | Status: DC
Start: 2016-07-05 — End: 2016-07-05
  Administered 2016-07-05: 12:00:00 40 mg via ORAL

## 2016-07-05 MED ORDER — FLUTICASONE PROPIONATE 50 MCG/ACT NA SUSP
50 MCG/ACT | Freq: Every day | NASAL | Status: DC
Start: 2016-07-05 — End: 2016-07-05
  Administered 2016-07-05: 12:00:00 2 via NASAL

## 2016-07-05 MED ORDER — ASPIRIN 81 MG PO CHEW
81 MG | Freq: Every day | ORAL | Status: DC
Start: 2016-07-05 — End: 2016-07-05
  Administered 2016-07-05: 12:00:00 81 mg via ORAL

## 2016-07-05 MED ORDER — THERAPEUTIC MULTIVIT/MINERAL PO TABS
Freq: Every day | ORAL | Status: DC
Start: 2016-07-05 — End: 2016-07-05

## 2016-07-05 MED ORDER — FENOFIBRATE 145 MG PO TABS
145 MG | Freq: Every day | ORAL | Status: DC
Start: 2016-07-05 — End: 2016-07-05
  Administered 2016-07-05: 12:00:00 145 mg via ORAL

## 2016-07-05 MED ORDER — TICAGRELOR 90 MG PO TABS
90 MG | Freq: Two times a day (BID) | ORAL | Status: DC
Start: 2016-07-05 — End: 2016-07-05
  Administered 2016-07-05: 12:00:00 90 mg via ORAL

## 2016-07-05 MED ORDER — PAROXETINE HCL 10 MG PO TABS
10 MG | Freq: Every day | ORAL | Status: DC
Start: 2016-07-05 — End: 2016-07-05

## 2016-07-05 MED ORDER — ATORVASTATIN CALCIUM 80 MG PO TABS
80 MG | Freq: Every evening | ORAL | Status: DC
Start: 2016-07-05 — End: 2016-07-05
  Administered 2016-07-05: 03:00:00 80 mg via ORAL

## 2016-07-05 MED FILL — BUSPIRONE HCL 5 MG PO TABS: 5 MG | ORAL | Qty: 2

## 2016-07-05 MED FILL — THERA-M PO TABS: ORAL | Qty: 1

## 2016-07-05 MED FILL — MONTELUKAST SODIUM 10 MG PO TABS: 10 MG | ORAL | Qty: 1

## 2016-07-05 MED FILL — PATANOL 0.1 % OP SOLN: 0.1 % | OPHTHALMIC | Qty: 5

## 2016-07-05 MED FILL — FLUTICASONE PROPIONATE 50 MCG/ACT NA SUSP: 50 MCG/ACT | NASAL | Qty: 16

## 2016-07-05 NOTE — Discharge Instructions (Signed)
Discharge Instructions for Radial Heart Catherization    1.  Take it easy for 3-4 days.  2.  No driving for 2 days.  3.  No lifting of 5 lbs or more for 5 days with the affected arm.  4.  Can shower after 24 hours.  5.  Remove arm board after 24 hours.  6.  Apply a band aid to the insertion site daily for 5 days.  May apply antibiotic ointment if desired, but not necessary.  Wash site daily with soap and water.  7.  No creams, ointments, or powders near the insertion site.   8.  No tub baths, swimming, hot tubs, or hand washing dishes for 1 week.  9.  Watch for signs of infection (redness, warmth, swelling, or pus drainage) or coolness of extremity and call physician if this occurs  10.  If bleeding occurs from insertion site, apply pressure and call 911.       f/u dr Gabrielle Dare or mark 2 weeks  f/up PCP 1 week  Groin Care Instructions        Normal Observation: You may or may not experience these.  ?   Soreness or tenderness that may last a few weeks.  ?   Possible bruising that could last a few weeks and up to one month.  ?  Formation of a small lump (dime to quarter size) that should last only a few weeks.    Care of your incision  ? You may shower 24 hours after the procedure. Wet the dressing thoroughly and gently remove the bandage from the hospital during showering. It is easier to remove this way.            ?  Gently clean your site daily using soap and water while standing in the shower. Dry thoroughly.  ?  Do not apply powders or lotions to the site for 2 weeks.  ?  Keep the site clean and dry to prevent infection.   ?  Do not sit in a bathtub or a pool of water for 7 days.  ?   Inspect the site daily.    Activity  ?  You may resume normal activity in 2 days, including driving, letting pain be your     guide.  ?  Limit lifting over 5 pounds (half gallon of milk) to one week or until site heals.  ? Limit vigorous activity (contact sports) to two weeks time.  ? You will be able to return to work in 1-3  days.    Call our office immediately if you experience any of the following.  ? Significant bleeding does not stop after 10 minutes of applying firm pressure directly over incision.  ?  Increased swelling of groin or leg.  ?  Unusual pain at groin or down that leg.  ?  Signs of infection: redness, warmth to touch, drainage, poorly healing incision, fever, or chills.  ?        Percutaneous Coronary Intervention: What to Expect at Home  Your Recovery    Percutaneous coronary intervention (PCI) is the name for procedures that are used to open a narrowed or blocked coronary artery. The two most common PCI procedures are coronary angioplasty and coronary stent placement.  Your groin or arm may have a bruise and feel sore for a day or two after a percutaneous coronary intervention (PCI). You can do light activities around the house, but nothing strenuous for several days.  This care  sheet gives you a general idea about how long it will take for you to recover. But each person recovers at a different pace. Follow the steps below to get better as quickly as possible.  How can you care for yourself at home?  Activity    If the doctor gave you a sedative:   For 24 hours, don't do anything that requires attention to detail. It takes time for the medicine's effects to completely wear off.   For your safety, do not drive or operate any machinery that could be dangerous. Wait until the medicine wears off and you can think clearly and react easily.     Do not do strenuous exercise and do not lift, pull, or push anything heavy until your doctor says it is okay. This may be for a day or two. You can walk around the house and do light activity, such as cooking.     If the catheter was placed in your groin, try not to walk up stairs for the first couple of days.     If the catheter was placed in your arm near your wrist, do not bend your wrist deeply for the first couple of days. Be careful using your hand to get into and  out of a chair or bed.     Carry your stent identification card with you at all times.     If your doctor recommends it, get more exercise. Walking is a good choice. Bit by bit, increase the amount you walk every day. Try for at least 30 minutes on most days of the week.   Diet    Drink plenty of fluids to help your body flush out the dye. If you have kidney, heart, or liver disease and have to limit fluids, talk with your doctor before you increase the amount of fluids you drink.     Keep eating a heart-healthy diet that has lots of fruits, vegetables, and whole grains. If you have not been eating this way, talk to your doctor. You also may want to talk to a dietitian. This expert can help you to learn about healthy foods and plan meals.   Medicines    Your doctor will tell you if and when you can restart your medicines. He or she will also give you instructions about taking any new medicines.     If you take blood thinners, such as warfarin (Coumadin), clopidogrel (Plavix), or aspirin, be sure to talk to your doctor. He or she will tell you if and when to start taking those medicines again. Make sure that you understand exactly what your doctor wants you to do.     Your doctor will prescribe blood-thinning medicines. You will likely take aspirin plus another antiplatelet, such as clopidogrel (Plavix). It is very important that you take these medicines exactly as directed. These medicines help keep the coronary artery open and reduce your risk of a heart attack.     Call your doctor if you think you are having a problem with your medicine.   Care of the catheter site    For 1 or 2 days, keep a bandage over the spot where the catheter was inserted. The bandage probably will fall off in this time.     Put ice or a cold pack on the area for 10 to 20 minutes at a time to help with soreness or swelling. Put a thin cloth between the ice and your skin.  You may shower 24 to 48 hours after the  procedure, if your doctor okays it. Pat the incision dry.     Do not soak the catheter site until it is healed. Don't take a bath for 1 week, or until your doctor tells you it is okay.   Follow-up care is a key part of your treatment and safety. Be sure to make and go to all appointments, and call your doctor if you are having problems. It's also a good idea to know your test results and keep a list of the medicines you take.  When should you call for help?  Call 911 anytime you think you may need emergency care. For example, call if:    You passed out (lost consciousness).     You have severe trouble breathing.     You have sudden chest pain and shortness of breath, or you cough up blood.     You have symptoms of a heart attack, such as:   Chest pain or pressure.   Sweating.   Shortness of breath.   Nausea or vomiting.   Pain that spreads from the chest to the neck, jaw, or one or both shoulders or arms.   Dizziness or lightheadedness.   A fast or uneven pulse.  After calling 911, chew 1 adult-strength aspirin. Wait for an ambulance. Do not try to drive yourself.     You have been diagnosed with angina, and you have angina symptoms that do not go away with rest or are not getting better within 5 minutes after you take one dose of nitroglycerin.   Call your doctor now or seek immediate medical care if:    You are bleeding from the area where the catheter was put in your artery.     You have a fast-growing, painful lump at the catheter site.     You have signs of infection, such as:   Increased pain, swelling, warmth, or redness.   Red streaks leading from the catheter site.   Pus draining from the catheter site.   A fever.     Your leg or arm looks blue or feels cold, numb, or tingly.   Watch closely for changes in your health, and be sure to contact your doctor if you have any problems.  Where can you learn more?  Go to [email or web address] and sign in to your  MyChart account. Enter [id number] in the Search Health Information box to learn more about "Percutaneous Coronary Intervention: What to Expect at Home."     If you do not have an account, please click on the "Sign Up Now" link.  Current as of: Jun 03, 2015  Content Version: 11.6   2006-2018 [employer]. Care instructions adapted under license by [facility]. If you have questions about a medical condition or this instruction, always ask your healthcare professional. [employer] disclaims any warranty or liability for your use of this information.

## 2016-07-05 NOTE — Telephone Encounter (Signed)
.  Transition of Care visit scheduled.  07/15/2016  Patient is being discharged to home 07/05/16.

## 2016-07-05 NOTE — Telephone Encounter (Signed)
[facility]    Outreach made within 2 business days of discharge: Yes    Patient: Micheal BirminghamDavid A Arboleda Patient DOB: 04/14/1955   MRN: [id number]  Reason for Admission: There are no discharge diagnoses documented for the most recent discharge.  Discharge Date: 07/05/16       Spoke with: Onalee Huaavid     Facility: @DISCHDEPT @    TCM Interactive Patient Contact:  Was patient able to fill all prescriptions: No new meds    Was patient instructed to bring all medications to the follow-up visit: Yes    Is patient taking all medications as directed in the discharge summary? Yes    Does patient understand their discharge instructions: Yes    Does patient have questions or concerns that need addressed prior to 7-14 day follow up office visit: none    Scheduled appointment with PCP within 7-14 days    Follow Up  Future Appointments  Date Time Provider Department Center   07/15/2016 10:00 AM Cecille RubinMark D Kahle, DO [facility]   08/04/2016 2:00 PM Rica RecordsKurt Brickner, PSY.D [facility] [place]   08/15/2016 9:00 AM Evette CristalMark J Buettner, PA-C [facility] [place]   12/26/2016 11:00 AM Zoheir Derl BarrowA Abdelbaki, MD [facility] [place]   04/14/2017 10:00 AM Zoheir Derl BarrowA Abdelbaki, MD [facility] [place]     Pt is aware of follow up appt with Dr Ulyess MortKahle on 07/15/16 at 10:00 am    Jerita Wimbush, LPN

## 2016-07-05 NOTE — Progress Notes (Signed)
Inpatient Cardiac Rehabilitation Consult    Received consult for Phase II Cardiac Rehabilitation.  Cardiac Rehab education completed with patient.  May be interested in doing rehab.  Will call him at home in a few days to schedule.  Brochure given.

## 2016-07-05 NOTE — Progress Notes (Signed)
Patient received pamphlet about cardiac intervention, how to take care of the incision site, mended hearts program, cardiac rehab and the hours of operations, and Nutritional information regarding cardiac diet.

## 2016-07-05 NOTE — Progress Notes (Deleted)
Nutrition Assessment    Type and Reason for Visit: Initial, Consult, Patient Education (Cardiac)    Nutrition Recommendations: Provided pt and his wife with diabetic/cardiac diet education.     Malnutrition Assessment:   Malnutrition Status: No malnutrition    Nutrition Diagnosis:    Problem:  Pt with increased CHO intake and questionable salt/fat intake   Etiology:  due to pt's limited knowledge of Diabetic/Cardiac Diet  . Signs and symptoms:  as evidenced by Lab values, BMI, pt's report of diet history    Nutrition Assessment:   Subjective Assessment: Pt admitted with Angina. Pt is s/p PCI to Lcx and RCA yesterday. Labs: A1C 8.5%; TG 168, HDL 31, LDL 91. Pt and wife seen before discharge for cardiac and some diabetic diet education. Pt limits his salt intake already but had a few questions about cardiac diet but more about his diabetic diet. Spent a good amount of time with pt/wife on cardiac and diabetic diet. Provided written and verbal education. Feel that pt had a fair/good understanding of diets (encouraged outpatient educ in the future too) and is willing to comply.    Wound Type: None   Current Nutrition Therapies:   Oral Diet Orders: Cardiac    Oral Diet intake: 76-100%   Oral Nutrition Supplement (ONS) Orders: None   Anthropometric Measures:   Ht: 5\' 7"  (170.2 cm)    Current Body Wt: 198 lb 12.8 oz (90.2 kg)   Admission Body Wt: 198 lb 12.8 oz (90.2 kg)   Ideal Body Wt: 148 lb (67.1 kg), % Ideal Body 134%   BMI Classification: BMI 30.0 - 34.9 Obese Class I   Comparative Standards (Estimated Nutrition Needs):   Estimated Daily Total Kcal: (22) 1980 kcal/day   Estimated Daily Protein (g): (1.5) 135 grams/day    Estimated Intake vs Estimated Needs: Intake Meets Needs    Nutrition Risk Level: Low    Nutrition Interventions:   Modify current diet (Cardiac and CHO Controlled Diet)  Continued Inpatient Monitoring, Education Completed    Nutrition Evaluation:    Evaluation: Goals set     Goals: Pt will adhere to diet during pt's hospital stay and after discharge home     Monitoring: Meal Intake, Weight, Comparative Standards, Pertinent Labs, Patient/Family Education    See Adult Nutrition Doc Flowsheet for more detail.     Electronically signed by Quintella BatonKara J Averlee Swartz, RD, LD on 07/05/16 at 11:53 AM    Contact Number: [phone]

## 2016-07-05 NOTE — Progress Notes (Signed)
Cardiology Progress Note      Patient:  Micheal [name]  Date of Birth: 17-Jun-1955  MRN: [id number]   Acct: [id number]  Admit Date:  07/04/2016  Primary Cardiologist: Camille Bal MD    Chief Complaint: outpt cath    Subjective (Events in last 24 hours): pt awake and alert.  NAD.  No cp or sob.  Pt feels good      Objective:   BP 119/61   Pulse 74   Temp 97.8 F (36.6 C) (Oral)   Resp 18   Ht 5\' 7"  (1.702 m)   Wt 198 lb 12.8 oz (90.2 kg)   SpO2 96%   BMI 31.14 kg/m        TELEMETRY: nsr    Physical Exam:  General Appearance: alert and oriented to person, place and time, in no acute distress  Cardiovascular: normal rate, regular rhythm, normal S1 and S2, no murmurs, rubs, clicks, or gallops, distal pulses intact, no carotid bruits, no JVD  Pulmonary/Chest: clear to auscultation bilaterally- no wheezes, rales or rhonchi, normal air movement, no respiratory distress  Abdomen: soft, non-tender, non-distended, normal bowel sounds, no masses Extremities: no cyanosis, clubbing or edema, pulse   Skin: warm and dry, no rash or erythema  Head: normocephalic and atraumatic  Eyes: pupils equal, round, and reactive to light  Neck: supple and non-tender without mass, no thyromegaly   Musculoskeletal: normal range of motion, no joint swelling, deformity or tenderness  Neurological: alert, oriented, normal speech, no focal findings or movement disorder noted  Right wrist - no hematoma, +sensation, +DP, +PT    Medications:   . sodium chloride flush  10 mL Intravenous 2 times per day   . therapeutic multivitamin-minerals  1 tablet Oral Daily   . olopatadine  1 drop Both Eyes BID   . fluticasone  2 spray Nasal Daily   . fenofibrate  145 mg Oral Daily   . esomeprazole  40 mg Oral QAM AC   . lisinopril  40 mg Oral Daily   . glimepiride  4 mg Oral QAM   . aspirin  81 mg Oral Daily   . isosorbide mononitrate  30 mg Oral Daily   . atorvastatin  80 mg Oral Nightly   . ticagrelor  90 mg Oral BID   . metoprolol succinate  25 mg Oral  Daily   . busPIRone  7.5 mg Oral BID   . montelukast  10 mg Oral Daily   . PARoxetine  10 mg Oral Daily     . sodium chloride Stopped (07/05/16 0810)       sodium chloride flush 10 mL PRN   acetaminophen 650 mg Q4H PRN   magnesium hydroxide 30 mL Daily PRN   ondansetron 4 mg Q6H PRN   LORazepam 0.5 mg Q8H PRN   oxyCODONE-acetaminophen 1 tablet Q8H PRN       Lab Data:    Cardiac Enzymes:  No results for input(s): CKTOTAL, CKMB, CKMBINDEX, TROPONINI in the last 72 hours.    CBC:   Lab Results   Component Value Date    WBC 7.5 07/04/2016    RBC 4.58 07/04/2016    HGB 13.5 07/04/2016    HCT 40.1 07/04/2016    PLT 261 07/04/2016       CMP:  Lab Results   Component Value Date    NA 139 07/04/2016    K 4.0 07/04/2016    CL 102 07/04/2016    CO2 21 07/04/2016  BUN 21 07/04/2016    CREATININE 1.2 07/04/2016    LABGLOM 61 07/04/2016    GLUCOSE 89 07/04/2016    CALCIUM 9.5 07/04/2016       Hepatic Function Panel:  Lab Results   Component Value Date    ALKPHOS 36 05/31/2016    ALT 41 05/31/2016    AST 27 05/31/2016    PROT 8.5 05/31/2016    BILITOT 0.5 05/31/2016    LABALBU 4.7 05/31/2016       Magnesium:  No results found for: MG    PT/INR:    Lab Results   Component Value Date    INR 1.01 07/04/2016       HgBA1c:    Lab Results   Component Value Date    LABA1C 8.5 01/13/2016       FLP:  Lab Results   Component Value Date    TRIG 168 05/31/2016    HDL 31 05/31/2016    LDLCALC 91 05/31/2016       TSH:    Lab Results   Component Value Date    TSH 3.000 09/03/2014         Assessment:    s/p PCI to Lcx and RCA    Plan:   Discharge home    Cardiac Rehab: Yes    Follow-up visits:   No follow-up provider specified.     Discharge condition: good  Disposition: Home  Time spent on discharge: <30 min      Discharge Medications for PCI/MI (performed or attempted):    ASA: yes   Statin: yes   P2Y12 Inhibitor: yes   Beta Blocker: yes   Nitro SL: yes         Electronically signed by Jeanella Crazearcy C Maureena Dabbs, PA-C on 07/05/2016 at 10:36  AM

## 2016-07-05 NOTE — Plan of Care (Signed)
Problem: Discharge Planning:  Goal: Participates in care planning  Participates in care planning   Outcome: Ongoing  Being discharged to home.    Comments: Care plan reviewed with patient.  Patient verbalize understanding of the plan of care and contribute to goal setting.

## 2016-07-05 NOTE — Progress Notes (Signed)
Educated on discharge instructions, medications, post cath restrictions, and follow up appointments. No further questions or concerns voiced at this time. Patient received pamphlet about cardiac intervention, how to take care of the incision site, mended hearts program, cardiac rehab and the hours of operations, and Nutritional information regarding cardiac diet.  Discharged home with spouse.

## 2016-07-05 NOTE — Telephone Encounter (Signed)
[facility] calling in to schedule this patient for a follow up in 2-4 weeks, follow up stents. Next available appointment scheduled with Loraine LericheMark for 08/15/16, please advise if ok to keep or if needs seen sooner.

## 2016-07-05 NOTE — Telephone Encounter (Signed)
Left message on patient's voicemail to call office back. Appointment rescheduled to 07-20-16.

## 2016-07-05 NOTE — Care Coordination-Inpatient (Addendum)
07/05/16, 9:21 AM      Micheal [name]       Admitted from: 2E 07/04/2016/ 1319 Hospital day: 0   Location: 8B-21/021-A Reason for admit: Angina, class III (HCC) [I20.9] Status: OP in a bed  Admit order signed?: yes  PMH:  has a past medical history of Arthritis; CAD (coronary artery disease); Diabetes mellitus (HCC); GERD (gastroesophageal reflux disease); Hyperlipidemia; and Hypertension.  Procedure:   6/11 Cardiac Cath with PCI to Lcx and RCA  Pertinent abnormal Imaging: none  Medications:  Scheduled Meds:  . sodium chloride flush  10 mL Intravenous 2 times per day   . therapeutic multivitamin-minerals  1 tablet Oral Daily   . olopatadine  1 drop Both Eyes BID   . fluticasone  2 spray Nasal Daily   . fenofibrate  145 mg Oral Daily   . esomeprazole  40 mg Oral QAM AC   . lisinopril  40 mg Oral Daily   . glimepiride  4 mg Oral QAM   . aspirin  81 mg Oral Daily   . isosorbide mononitrate  30 mg Oral Daily   . atorvastatin  80 mg Oral Nightly   . ticagrelor  90 mg Oral BID   . metoprolol succinate  25 mg Oral Daily   . busPIRone  7.5 mg Oral BID   . montelukast  10 mg Oral Daily   . PARoxetine  10 mg Oral Daily     Continuous Infusions:  . sodium chloride Stopped (07/05/16 0810)      Pertinent Info/Orders/Treatment Plan: On room air. Afebrile. Ox4. Puncture site WNL. Cardiac Rehab and Dietitian consulted. Telemetry, I&O, daily weight, n/v checks, wound care. Asa, lipitor, nexium, amaryl, imdur, lisinopril, toprol xl, brilinta.   Diet: DIET CARDIAC;   DVT Prophylaxis: none - primary RN notified  Smoking status:  reports that he quit smoking about 8 years ago. His smoking use included Cigarettes. He has never used smokeless tobacco.   Influenza Vaccination Screening Completed: n/a  Pneumonia Vaccination Screening Completed: n/a  Core measures: monitor  PCP: Micheal RubinMark D Kahle, DO  Readmission: no    Discharge Planning  Current Residence:  Private Residence  Living Arrangements:  Spouse/Significant Other   Support Systems:   Spouse/Significant Other, Family Members, Friends/Neighbors  Current Services PTA:     Potential Assistance Needed:  N/A  Potential Assistance Purchasing Medications:  No  Does patient want to participate in local refill/ meds to beds program?  No  Type of Home Care Services:  None  Patient expects to be discharged to:  home  Expected Discharge date:  07/05/16  Follow Up Appointment: Best Day/ Time:      Discharge Plan: Spoke with Micheal Huaavid; states he lives at home alone since he and his wife separated a while back. He states he did not use any DME or have any HH services PTA. Micheal HuaDavid states he plans to return home at discharge and his wife is coming to stay with him for a while. He states they are talking about getting back together. Micheal HuaDavid denies any needs for discharge.     Social Services Evaluation: no    07/05/16, 9:50 AM    Discharge plan discussed by Case Manager and Child psychotherapistocial Worker.  Discharge plan reviewed with patient/ family.  Patient/ family verbalize understanding of discharge plan and are in agreement with plan.  Understanding was demonstrated using the teach back method.

## 2016-07-05 NOTE — Progress Notes (Signed)
Nutrition Assessment    Type and Reason for Visit: Initial, Consult (Cardiac Diet Education)    Nutrition Recommendations: Provided pt and wife with Cardiac & Diabetic Diet Education.     Malnutrition Assessment:   Malnutrition Status: No malnutrition    Nutrition Diagnosis:    Problem:  Pt with increased CHO intake and questionable salt/fat intake   Etiology:  due to pt's limited knowledge of Diabetic/Cardiac Diet    . Signs and symptoms:  as evidenced by Lab values, BMI and by pt's report of his diet history    Nutrition Assessment:   Subjective Assessment: Pt admitted with Angina. Pt is s/p PCI to LCX and RCA yesterday. Labs A1C 8.5%, TG 168, HDL 31, LDL 91. Pt and wife seen before discharge. Pt stated that he has a family hx of CAD and has limited his salt intake for quite awhile now. Pt had a few questions about his cardiac diet but had more questions about his diabetic diet. Provided pt and wife with written and verbal diet education at great length. Feel that pt had a fair/good understanding of diet and wants to comply. Encouraged outpatient diet education after discharged as well.    Wound Type: None   Current Nutrition Therapies:   Oral Diet Orders: Cardiac    Oral Diet intake: 76-100%   Oral Nutrition Supplement (ONS) Orders: None   Anthropometric Measures:   Ht: 5\' 7"  (170.2 cm)    Current Body Wt: 198 lb 12.8 oz (90.2 kg)   Admission Body Wt: 198 lb 12.8 oz (90.2 kg)   Ideal Body Wt: 148 lb (67.1 kg), % Ideal Body 134%   BMI Classification: BMI 30.0 - 34.9 Obese Class I   Comparative Standards (Estimated Nutrition Needs):   Estimated Daily Total Kcal: (22) 1980 kcal/day   Estimated Daily Protein (g): (1.5) 135 grams/day    Estimated Intake vs Estimated Needs: Intake Meets Needs    Nutrition Risk Level: Low    Nutrition Interventions:   Modify current diet (Cardiac and CHO Controlled Diet)  Continued Inpatient Monitoring, Education Completed    Nutrition Evaluation:    Evaluation:  Goals set    Goals: Pt will adhere to diet during pt's hospital stay and after discharge home     Monitoring: Meal Intake, Weight, Comparative Standards, Pertinent Labs, Patient/Family Education    See Adult Nutrition Doc Flowsheet for more detail.     Electronically signed by Quintella BatonKara J Bonni Neuser, RD, LD on 07/05/16 at 12:09 PM    Contact Number: [phone]

## 2016-07-06 NOTE — Telephone Encounter (Signed)
LM for patient to return call.

## 2016-07-07 NOTE — Telephone Encounter (Signed)
Pt states he already got the message on the new date and time.

## 2016-07-11 NOTE — Plan of Care (Signed)
Hospital Facility-Based Program  [facility]/Traditional Cardiac Rehab  PHYSICIAN ORDER  Class I Level B based on research  Medical Director:  Dr. Waymon AmatoKishore Nallu, MD     Patient Name: Micheal [name] DOB: 1955-09-11  Referring Physician: Dr. Stefani DamaNallu  Date: 07/11/2016  Allergies:   Allergies as of 07/21/2016   . 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  Hospital setting for patient safety.   []  72 sessions: 36 exercise sessions, 36 education sessions   []  36 sessions: 18 exercise sessions, 18 education sessions  []  Traditional Cardiac Rehab with telemetry monitoring, resting and exercise BPs &       HRs with each session.  Hospital setting for patient safety.   []  36 sessions:  32 exercise sessions, 4 education sessions     Per Patient symptoms, proceed with:   [x] Nitroglycerine 0.4mg  SL every 5 minutes prn, maximum of 3, for chest pain   [x] 12-lead EKG for symptoms of chest pain or noted change in heart rhythm   [x] Administer O2 per nasal cannula for symptoms of chest pain or acute dyspnea    Physician Prescribed Exercise:  Plan of Care:  Patient to attend exercise sessions with aerobic endurance and strength training for a total of 31-60 min/day, 3 days/week with supplemented 30+ minutes of aerobic exercise at home on days not participating in Cardiac Rehab.   Aerobic Endurance Training  Aerobic Endurance mode (TM, AD, NS) starting at 5-8 minutes progressing by 2-3 minutes each week to a total of 15-30 minutes 2-3x/week.  Arms only 5 min  Stair step increasing to 2 min  Resistance/strength training:  Hand weights starting at 1-5 lbs increasing in weight by 1-2 lbs and/or per patient tolerance weekly.  Start with 8 repetitions and increase the repetitions each exercise session per patient tolerance for a total of 15 repetitions.  Measurable  Endurance Goal:  Aerobic endurance goal to be measured in minutes.  Start endurance training per patient tolerance at 1-8 minutes per exercise type, progressing to a total of 31-60 minutes using various modes of training (see Exercise Prescription).  Progression:    [x]  Weekly 5-10% intensity progression, as tolerated, during cardiac rehab sessions.   [x]  13-17 Rate of Perceived Exertion on the Borg Scale (6-20).  Measurable Muscular Strength Goal:  Starting at 1-5 lbs x 8 reps, progressing to 5-25 lbs x 15 reps per patient tolerance.      Electronically signed by Shaune PollackLaura L Antaeus Karel on 07/11/2016 at 3:36 PM    Exercise Physiologist/Date/Time

## 2016-07-14 ENCOUNTER — Encounter

## 2016-07-14 MED ORDER — OXYCODONE-ACETAMINOPHEN 10-325 MG PO TABS
10-325 | ORAL_TABLET | Freq: Three times a day (TID) | ORAL | 0 refills | Status: DC | PRN
Start: 2016-07-14 — End: 2016-08-11

## 2016-07-14 NOTE — Telephone Encounter (Signed)
Micheal [name] called requesting a refill on the following medications:  Requested Prescriptions     Pending Prescriptions Disp Refills   ??? oxyCODONE-acetaminophen (PERCOCET) 10-325 MG per tablet 90 tablet 0     Sig: Take 1 tablet by mouth every 8 hours as needed for Pain for up to 30 days..     Pharmacy verified: [facility] [email or web address]  .pv      Date of last visit:  05/19/16  Date of next visit (if applicable): 07/19/2016

## 2016-07-14 NOTE — Telephone Encounter (Signed)
Controlled Substances Monitoring:     RX Monitoring 07/14/2016   Attestation The Prescription Monitoring Report for this patient was reviewed today.   Documentation No signs of potential drug abuse or diversion identified.

## 2016-07-15 ENCOUNTER — Telehealth

## 2016-07-15 ENCOUNTER — Encounter

## 2016-07-15 ENCOUNTER — Encounter: Attending: Family Medicine | Primary: Family

## 2016-07-15 MED ORDER — GLIMEPIRIDE 4 MG PO TABS
4 | ORAL_TABLET | Freq: Every morning | ORAL | 5 refills | Status: DC
Start: 2016-07-15 — End: 2016-08-11

## 2016-07-15 MED ORDER — LISINOPRIL 40 MG PO TABS
40 | ORAL_TABLET | ORAL | 3 refills | Status: DC
Start: 2016-07-15 — End: 2017-04-27

## 2016-07-15 NOTE — Telephone Encounter (Signed)
[facility] is requesting a 90 day supply of lisinopril. See attached media.

## 2016-07-15 NOTE — Telephone Encounter (Signed)
See attached

## 2016-07-15 NOTE — Telephone Encounter (Signed)
Sent!

## 2016-07-19 ENCOUNTER — Ambulatory Visit: Admit: 2016-07-19 | Discharge: 2016-07-19 | Payer: MEDICARE | Attending: Family Medicine | Primary: Family

## 2016-07-19 DIAGNOSIS — I25110 Atherosclerotic heart disease of native coronary artery with unstable angina pectoris: Secondary | ICD-10-CM

## 2016-07-19 LAB — POCT GLYCOSYLATED HEMOGLOBIN (HGB A1C): Hemoglobin A1C: 6.4 %

## 2016-07-19 MED ORDER — BUSPIRONE HCL 15 MG PO TABS
15 | ORAL_TABLET | Freq: Two times a day (BID) | ORAL | 5 refills | Status: DC
Start: 2016-07-19 — End: 2016-11-10

## 2016-07-19 MED ORDER — IPRATROPIUM BROMIDE 0.03 % NA SOLN
0.03 % | Freq: Two times a day (BID) | NASAL | 5 refills | Status: DC
Start: 2016-07-19 — End: 2017-05-15

## 2016-07-19 NOTE — Progress Notes (Signed)
Visit Information    Have you changed or started any medications since your last visit including any over-the-counter medicines, vitamins, or herbal medicines? no   Are you having any side effects from any of your medications? -  no  Have you stopped taking any of your medications? Is so, why? -  no    Have you seen any other physician or provider since your last visit? No  Have you had any other diagnostic tests since your last visit? Yes - Records Obtained  Have you been seen in the emergency room and/or had an admission to a hospital since we last saw you? Yes - Records Obtained  Have you had your routine dental cleaning in the past 6 months? no    Have you activated your MyChart account? If not, what are your barriers? No: Declined     Patient Care Team:  Cecille RubinMark D Kahle, DO as PCP - General (Family Medicine)  Cecille RubinMark D Kahle, DO as PCP - [facility] Attributed Provider        Health Maintenance   Topic Date Due   . Hepatitis C screen  November 05, 1955   . Diabetic retinal exam  06/11/1964   . HIV screen  06/11/1969   . DTaP/Tdap/Td vaccine (1 - Tdap) 06/11/1973   . Shingles Vaccine (1 of 2 - 2 Dose Series) 06/11/2004   . Diabetic foot exam  07/14/2016   . A1C test (Diabetic or Prediabetic)  04/12/2017   . Diabetic microalbuminuria test  04/14/2017   . Lipid screen  05/31/2017   . Potassium monitoring  07/04/2017   . Creatinine monitoring  07/04/2017   . Colon cancer screen colonoscopy  07/25/2023   . Flu vaccine  Completed   . Pneumococcal med risk  Completed

## 2016-07-19 NOTE — Progress Notes (Signed)
Transition of Care Visit/Hospital Follow Up:      Micheal BirminghamDavid A [name] is a 61 y.o. male that presents for Follow-Up from Hospital (pt states he is getting better, still SOB) and Sinus Problem (pt states he is having eye pressure, sinus drainage, mainly at night)        Date of Discharge:   07/05/16  Was patient contacted within 2 business days of discharge (see chart for documentation):  yes - 07/05/16      Patient presents for hospital follow up.  Patient recently hospitalized at [facility] for treatment of CAD.    Symptoms prior to admission:  SOB, chest pain     Hospital Course per last progress noted:    Assessment:    s/p PCI to Lcx and RCA    Plan:   Discharge home    Cardiac Rehab: Yes    Follow-up visits:   No follow-up provider specified.     Discharge condition: good  Disposition: Home  Time spent on discharge: <30 min      Discharge Medications for PCI/MI (performed or attempted):    ASA: yes   Statin: yes   P2Y12 Inhibitor: yes   Beta Blocker: yes   Nitro SL: yes        Clinical course since discharge:  States that he is feeling better.  He is set up for cardiac rehab.  Notes that he is having a little SOB, worse with exertion.  No CP, palpitations. No pain, tenderness edema. Appetite has been good.  Notes some constipation, but this is improving with prune.      GAD:  States that this is improved.  He weaned himself off of the Ativan.  He is following with Dr Ivy LynnBrickner.  Last anxiety attack has been 'a while'.  Sleep is still a struggle.  Wakes up frequently.  Can fall asleep without much issue.      Diabetes Type 2    Glucose control:   Does patient check blood glucoses at home?  No  Report of hypoglycemia: no  Lab Results   Component Value Date    LABA1C 6.4 07/19/2016     No results found for: EAG    Symptoms  Polyuria, Polydipsia or Polyphagia?   No  Chest Pain, SOB, or Palpitations? -  Yes - SOB  New Vision complaints? No  Paresthesias of the extremities?  No    Medications  Current medication were  reviewed.  Compliant with medications?  yes  Medication side effects?  No  On ACE-I or ARB?  Yes  On antiplatelet therapy?  Yes  On Statin?  Yes    Exercise  Exercise? No  Wt Readings from Last 3 Encounters:   07/19/16 209 lb (94.8 kg)   07/05/16 198 lb 12.8 oz (90.2 kg)   06/14/16 205 lb (93 kg)       Diet discipline?:  Low salt, fat, sugar diet?  no    Blood pressure control:  BP Readings from Last 3 Encounters:   07/19/16 112/80   07/05/16 123/67   06/14/16 134/78       Lab Results   Component Value Date    LABMICR 1.28 09/03/2014       Lab Results   Component Value Date    LDLCALC 91 05/31/2016           Current Outpatient Prescriptions   Medication Sig Dispense Refill   . busPIRone (BUSPAR) 15 MG tablet Take 15 mg by mouth 2  times daily 60 tablet 5   . ipratropium (ATROVENT) 0.03 % nasal spray 2 sprays by Nasal route 2 times daily 1 Bottle 5   . glimepiride (AMARYL) 4 MG tablet Take 1 tablet by mouth every morning 30 tablet 5   . lisinopril (PRINIVIL;ZESTRIL) 40 MG tablet TAKE 1 TABLET BY MOUTH DAILY 90 tablet 3   . oxyCODONE-acetaminophen (PERCOCET) 10-325 MG per tablet Take 1 tablet by mouth every 8 hours as needed for Pain for up to 30 days.. 90 tablet 0   . montelukast (SINGULAIR) 10 MG tablet Take 1 tablet by mouth daily 30 tablet 3   . PARoxetine (PAXIL) 10 MG tablet Take 1 tablet by mouth daily 30 tablet 3   . nitroGLYCERIN (NITROSTAT) 0.4 MG SL tablet Place 1 tablet under the tongue every 5 minutes as needed for Chest pain 25 tablet 2   . aspirin 81 MG chewable tablet Take 1 tablet by mouth daily 30 tablet 0   . isosorbide mononitrate (IMDUR) 30 MG extended release tablet Take 1 tablet by mouth daily 30 tablet 3   . atorvastatin (LIPITOR) 80 MG tablet Take 1 tablet by mouth nightly 30 tablet 3   . ticagrelor (BRILINTA) 90 MG TABS tablet Take 1 tablet by mouth 2 times daily 60 tablet 3   . metoprolol succinate (TOPROL XL) 25 MG extended release tablet Take 1 tablet by mouth daily 30 tablet 3   .  fenofibrate (TRICOR) 145 MG tablet TAKE 1 TABLET BY MOUTH DAILY 30 tablet 5   . esomeprazole (NEXIUM) 40 MG delayed release capsule TAKE 1 CAPSULE BY MOUTH EVERY MORNING BEFORE BREAKFAST 30 capsule 5   . metFORMIN (GLUCOPHAGE) 1000 MG tablet Take 1 tablet by mouth 2 times daily (with meals) 180 tablet 3   . hydrocortisone 2.5 % cream Apply to external ear BID PRN 1 Tube 2   . olopatadine (PATANOL) 0.1 % ophthalmic solution Place 1 drop into both eyes 2 times daily 1 Bottle 5   . Handicap Placard MISC by Does not apply route Expires 03/01/2020 1 each 0   . Multiple Vitamins-Minerals (CENTRUM SILVER ULTRA MENS) TABS Take 1 tablet by mouth daily       No current facility-administered medications for this visit.        Past Medical History:   Diagnosis Date   . Arthritis     general   . CAD (coronary artery disease)    . Diabetes mellitus (HCC)    . GERD (gastroesophageal reflux disease)    . Hyperlipidemia    . Hypertension        Past Surgical History:   Procedure Laterality Date   . CORONARY ARTERY BYPASS GRAFT     . ORTHOPEDIC SURGERY Left        Social History   Substance Use Topics   . Smoking status: Former Smoker     Packs/day: 1.00     Years: 10.00     Types: Cigarettes     Quit date: 05/24/2008   . Smokeless tobacco: Never Used   . Alcohol use Yes      Comment: once monthly        Family History   Problem Relation Age of Onset   . Cancer Mother    . Heart Disease Brother    . Stroke Brother    . Diabetes Brother    . Early Death Brother    . High Blood Pressure Brother    . High Cholesterol  Brother    . Mental Retardation Sister          I have reviewed the patient's past medical history, past surgical history, allergies, medications, social and family history and I have made updates where appropriate.        PHYSICAL EXAM:  BP 112/80   Pulse 82   Temp 97.9 F (36.6 C) (Oral)   Resp 16   Ht 5\' 7"  (1.702 m)   Wt 209 lb (94.8 kg)   BMI 32.73 kg/m   General Appearance: well developed and well- nourished, in  no acute distress  Head: normocephalic and atraumatic  ENT: external ear and ear canal normal bilaterally, nose without deformity, nasal mucosa and turbinates normal without polyps, oropharynx normal, dentition is normal for age, no lip or gum lesions noted  Neck: supple and non-tender without mass, no thyromegaly or thyroid nodules, no cervical lymphadenopathy  Pulmonary/Chest: clear to auscultation bilaterally- no wheezes, rales or rhonchi, normal air movement, no respiratory distress or retractions  Cardiovascular: normal rate, regular rhythm, normal S1 and S2, no murmurs, rubs, clicks, or gallops, distal pulses intact  Extremities: no cyanosis, clubbing or edema of the lower extremities  Psych:  Normal affect without evidence of depression or anxiety, insight and judgement are appropriate, memory appears intact  Skin: warm and dry, no rash or erythema      Diagnostic test results reviewed: inpatient labs    Patient risk of morbidity and mortality: moderate      ASSESSMENT & PLAN  Micheal [name] was seen today for follow-up from hospital and sinus problem.    Diagnoses and all orders for this visit:    Coronary artery disease involving native coronary artery of native heart with unstable angina pectoris (HCC)  -     PR DISCHARGE MEDS RECONCILED W/ CURRENT OUTPATIENT MED LIST    Type 2 diabetes mellitus without complication, without long-term current use of insulin (HCC)  -     POCT glycosylated hemoglobin (Hb A1C)  -     PR DISCHARGE MEDS RECONCILED W/ CURRENT OUTPATIENT MED LIST    GAD (generalized anxiety disorder)  -     busPIRone (BUSPAR) 15 MG tablet; Take 15 mg by mouth 2 times daily    History of coronary artery stent placement    Chronic non-seasonal allergic rhinitis, unspecified trigger  -     ipratropium (ATROVENT) 0.03 % nasal spray; 2 sprays by Nasal route 2 times daily        Return in about 6 months (around 01/18/2017), or if symptoms worsen or fail to improve.    Level of medical complexity:   moderate    -Overall, patient is improving  -Follow up with Cardiology as scheduled  -Continue current meds  -Advised to continue to closely monitor her symptoms and if any worsening to seek treatment    Micheal [name] received counseling on the following healthy behaviors: medication adherence  Reviewed prior labs and health maintenance.  Continue current medications, diet and exercise.  Discussed use, benefit, and side effects of prescribed medications. Barriers to medication compliance addressed.   Patient given educational materials - see patient instructions.    All patient questions answered.  Patient voiced understanding.

## 2016-07-20 ENCOUNTER — Ambulatory Visit: Admit: 2016-07-20 | Discharge: 2016-07-20 | Payer: MEDICARE | Attending: Physician Assistant | Primary: Family

## 2016-07-20 DIAGNOSIS — I25810 Atherosclerosis of coronary artery bypass graft(s) without angina pectoris: Secondary | ICD-10-CM

## 2016-07-20 NOTE — Progress Notes (Signed)
Patient is here to follow up from cath with stents.    Denies cp, palpitations, dizziness, lightheaded and LEE.    C/o sob with activity.               [facility]  [facility]   [address]   [address]   [place] [id number]   Dept: [phone]   Dept Fax: [phone]   Loc: [phone]      Chief Complaint   Patient presents with   ??? Follow Up After Procedure     cath stents   ??? Hypertension   ??? Coronary Artery Disease     Patient presents for follow-up after recent PCI.  Patient states overall he has been doing well.  He does have some shortness of breath with activity.  He denies any chest pain or palpitations.  He did not have any issues with his heart catheterization site.  He is not having any problems with his current medications.  Cardiologist:  Dr. Gabrielle DareBAki        General:   No fever, no chills, No fatigue or weight loss  Pulmonary:    Intermittent shortness of breath  Cardiac:    Denies recent chest pain   GI:     No nausea or vomiting, no abdominal pain  Neuro:    No dizziness or light headedness  Musculoskeletal:  No recent active issues  Extremities:   No edema, good peripheral pulses      Past Medical History:   Diagnosis Date   ??? Arthritis     general   ??? CAD (coronary artery disease)     s/p stent placement   ??? Diabetes mellitus (HCC)    ??? GERD (gastroesophageal reflux disease)    ??? Hyperlipidemia    ??? Hypertension        No Known Allergies    Current Outpatient Prescriptions   Medication Sig Dispense Refill   ??? busPIRone (BUSPAR) 15 MG tablet Take 15 mg by mouth 2 times daily 60 tablet 5   ??? ipratropium (ATROVENT) 0.03 % nasal spray 2 sprays by Nasal route 2 times daily 1 Bottle 5   ??? glimepiride (AMARYL) 4 MG tablet Take 1 tablet by mouth every morning 30 tablet 5   ??? lisinopril (PRINIVIL;ZESTRIL) 40 MG tablet TAKE 1 TABLET BY MOUTH DAILY 90 tablet 3   ??? oxyCODONE-acetaminophen (PERCOCET) 10-325 MG per tablet Take 1 tablet by mouth every 8 hours as needed for  Pain for up to 30 days.. 90 tablet 0   ??? montelukast (SINGULAIR) 10 MG tablet Take 1 tablet by mouth daily 30 tablet 3   ??? PARoxetine (PAXIL) 10 MG tablet Take 1 tablet by mouth daily 30 tablet 3   ??? nitroGLYCERIN (NITROSTAT) 0.4 MG SL tablet Place 1 tablet under the tongue every 5 minutes as needed for Chest pain 25 tablet 2   ??? aspirin 81 MG chewable tablet Take 1 tablet by mouth daily 30 tablet 0   ??? isosorbide mononitrate (IMDUR) 30 MG extended release tablet Take 1 tablet by mouth daily 30 tablet 3   ??? atorvastatin (LIPITOR) 80 MG tablet Take 1 tablet by mouth nightly 30 tablet 3   ??? ticagrelor (BRILINTA) 90 MG TABS tablet Take 1 tablet by mouth 2 times daily 60 tablet 3   ??? metoprolol succinate (TOPROL XL) 25 MG extended release tablet Take 1 tablet by mouth daily 30 tablet 3   ???  fenofibrate (TRICOR) 145 MG tablet TAKE 1 TABLET BY MOUTH DAILY 30 tablet 5   ??? esomeprazole (NEXIUM) 40 MG delayed release capsule TAKE 1 CAPSULE BY MOUTH EVERY MORNING BEFORE BREAKFAST 30 capsule 5   ??? metFORMIN (GLUCOPHAGE) 1000 MG tablet Take 1 tablet by mouth 2 times daily (with meals) 180 tablet 3   ??? hydrocortisone 2.5 % cream Apply to external ear BID PRN 1 Tube 2   ??? olopatadine (PATANOL) 0.1 % ophthalmic solution Place 1 drop into both eyes 2 times daily 1 Bottle 5   ??? Handicap Placard MISC by Does not apply route Expires 03/01/2020 1 each 0   ??? Multiple Vitamins-Minerals (CENTRUM SILVER ULTRA MENS) TABS Take 1 tablet by mouth daily       No current facility-administered medications for this visit.        Social History     Social History   ??? Marital status: Married     Spouse name: N/A   ??? Number of children: N/A   ??? Years of education: N/A     Social History Main Topics   ??? Smoking status: Former Smoker     Packs/day: 1.00     Years: 35.00     Types: Cigarettes     Quit date: 05/24/2008   ??? Smokeless tobacco: Never Used   ??? Alcohol use 0.6 oz/week     1 Glasses of wine per week      Comment: once monthly    ??? Drug use: No   ???  Sexual activity: Yes     Partners: Female     Other Topics Concern   ??? None     Social History Narrative   ??? None       Family History   Problem Relation Age of Onset   ??? Cancer Mother    ??? Heart Disease Brother    ??? Stroke Brother    ??? Diabetes Brother    ??? Early Death Brother    ??? High Blood Pressure Brother    ??? High Cholesterol Brother    ??? Mental Retardation Sister        Blood pressure 94/62, pulse 84, height 5' 7.01" (1.702 m), weight 209 lb (94.8 kg).    General:   Well developed, well nourished  Lungs:   Clear to auscultation  Heart:    Normal S1 S2, No murmur, rubs, or gallops  Abdomen:   Soft, non tender, no organomegalies, positive bowel sounds  Extremities:   No edema, no cyanosis, good peripheral pulses  Neurological:   Awake, alert, oriented. No obvious focal deficits  Musculoskeletal:  No obvious deformities        Cardiac catheterization 07/04/2016  PCI of the RCA  PCI of a in-stent restenosis of the circumflex     Diagnosis Orders   1. Coronary artery disease involving coronary bypass graft of native heart without angina pectoris     2. S/P angioplasty with stent     3. Essential hypertension     4. Familial hypercholesterolemia     5. Hx of CABG         Continue Dr Areatha Keas current treatment plan    Continue same current medications and with constant vigilance to changes in symptoms and also any potential side effects. Return for care if become worse or seek medical attention immediately.    The patient is educated on symptoms of heart disease that include chest pain and passing out, dizziness, etc and  to report them if there is any change or go to emergency room.       Follow up with Dr Irene Limbo as scheduled or sooner if needed  (Please note that portions of this note were completed with a voice recognition program.  Efforts were made to edit the dictation but occasionally words are mis-transcribed.)      Vincent Peyer Nyasia Baxley MPAS, PA-C

## 2016-07-21 NOTE — Plan of Care (Signed)
Hospital Facility-Based Program  [facility]/Traditional Cardiac Rehab  PHYSICIAN ORDER  Class I Level B based on research  Medical Director:  Dr. Waymon AmatoKishore Nallu, MD     Patient Name: Micheal BirminghamDavid A Hayden DOB: [birth year]  Referring Physician: Dr. Ilean ChinaMakwana  Date: 07/21/2016  Allergies:   Allergies as of 07/28/2016   . (No Known Allergies)        Diagnosis:  PCI on 07/04/16    [x]  [facility] with telemetry monitoring, resting and exercise        BPs & HRs with each session.  Hospital setting for patient safety.   [x]  72 sessions: 36 exercise sessions, 36 education sessions   []  36 sessions: 18 exercise sessions, 18 education sessions  []  Traditional Cardiac Rehab with telemetry monitoring, resting and exercise BPs &       HRs with each session.  Hospital setting for patient safety.   []  36 sessions:  32 exercise sessions, 4 education sessions     Per Patient symptoms, proceed with:   [x] Nitroglycerine 0.4mg  SL every 5 minutes prn, maximum of 3, for chest pain   [x] 12-lead EKG for symptoms of chest pain or noted change in heart rhythm   [x] Administer O2 per nasal cannula for symptoms of chest pain or acute dyspnea    Physician Prescribed Exercise:  Plan of Care:  Patient to attend exercise sessions with aerobic endurance and strength training for a total of 31-60 min/day, 3 days/week with supplemented 30+ minutes of aerobic exercise at home on days not participating in Cardiac Rehab.   Aerobic Endurance Training  Aerobic Endurance mode (TM, AD, NS) starting at 5-8 minutes progressing by 2-3 minutes each week to a total of 15-30 minutes 2-3x/week.  Arms only 5 min  Stair step increasing to 2 min  Resistance/strength training:  Hand weights starting at 1-5 lbs increasing in weight by 1-2 lbs and/or per patient tolerance weekly.  Start with 8 repetitions and increase the repetitions each exercise session per patient tolerance for a total of 15 repetitions.  Measurable Endurance Goal:   Aerobic endurance goal to be measured in minutes.  Start endurance training per patient tolerance at 1-8 minutes per exercise type, progressing to a total of 31-60 minutes using various modes of training (see Exercise Prescription).  Progression:    [x]  Weekly 5-10% intensity progression, as tolerated, during cardiac rehab sessions.   [x]  13-17 Rate of Perceived Exertion on the Borg Scale (6-20).  Measurable Muscular Strength Goal:  Starting at 1-5 lbs x 8 reps, progressing to 5-25 lbs x 15 reps per patient tolerance.      Electronically signed by Elson Areaseanna A Reynolds-Griffin on 07/21/2016 at 1:11 PM    Exercise Physiologist/Date/Time

## 2016-07-22 ENCOUNTER — Inpatient Hospital Stay: Primary: Family

## 2016-07-28 ENCOUNTER — Inpatient Hospital Stay: Admit: 2016-07-28 | Payer: MEDICARE | Primary: Family

## 2016-07-28 DIAGNOSIS — Z9861 Coronary angioplasty status: Secondary | ICD-10-CM

## 2016-07-28 NOTE — Telephone Encounter (Signed)
Patient's wife called in today regarding that Micheal [name]'s air conditioner went out two days ago in his apartment.  They are refusing to fix it.  He had 3 stents put in June.    She is requesting documentation regarding his medical history.    Please advise [phone]

## 2016-07-28 NOTE — Telephone Encounter (Signed)
Letter and paperwork placed at front desk for pick up. Sonya informed. (ok per signed HIPAA)

## 2016-07-28 NOTE — Plan of Care (Addendum)
[facility]  Cardiac Rehab Program - [facility] Facility-Based Program  Individualized Cardiac Treatment Plan    Patient Name:  Micheal [name]  DOB:  1955/12/13  Age:  61 y.o.  MRN:  [id number]  Diagnosis: PCI:LCX, RCA  Date of Event: 07/04/16   Physician:  M[place]/ Baki  Next Office Visit:  11/18  Date Entered Program: 07/28/16  Risk Stratifications: '[]'  Low '[]'  Intermediate '[x]'  High  Allergies: No Known Allergies  **08/08/2016  Riku has decided to decline all education sessions, therefore he will be switched to TCR instead of ICR.  Electronically signed by Margaretmary Eddy    Individual Cardiac Treatment Plan -EXERCISE  INITIAL 61 DAY 56 DAY 90 DAY FINAL DAY   EXERCISE  ASSESSMENT/PLAN EXERCISE  REASSESSMENT EXERCISE   REASSESSMENT EXERCISE   REASSESSMENT EXERCISE  DISCHARGE/FOLLOW-UP   Date: 07/28/16 Date: Date: Date: Date:   Session #1 Session # ** Session # ** Session # ** Session # **  Last session completed on **   Stages of Change Stages of Change Stages of Change Stages of Change Stages of Change   '[x]'  Pre Contemplation  '[]'  Contemplation  '[]'  Preparation  '[]'  Action  '[]'  Maintenance  '[]'  Relapse '[]'  Pre Contemplation  '[]'  Contemplation  '[]'  Preparation  '[]'  Action  '[]'  Maintenance  '[]'  Relapse '[]'  Pre Contemplation  '[]'  Contemplation  '[]'  Preparation  '[]'  Action  '[]'  Maintenance  '[]'  Relapse '[]'  Pre Contemplation  '[]'  Contemplation  '[]'  Preparation  '[]'  Action  '[]'  Maintenance  '[]'  Relapse '[]'  Pre Contemplation  '[]'  Contemplation  '[]'  Preparation  '[]'  Action  '[]'  Maintenance  '[]'  Relapse   EXERCISE ASSESSMENT EXERCISE ASSESSMENT EXERCISE ASSESSMENT EXERCISE ASSESSMENT EXERCISE ASSESSMENT   6 Min Walk Test  Distance walked:   0.21 miles  1108.8 ft.  2.6 METs  Max HR: 120 BPM      RPE:  13    THR:  111-127  Rhythm:  NSR    6 Min Walk Test  Distance walked:   ** miles  ** ft  ** METs  Max HR:** BPM      RPE:  **  %Change ft= **    Rhythm:  **   DASI: 4.7 METS DASI: ** METS DASI: ** METS DASI: ** METS DASI: ** METS   Return  to Work  [employer] on returning to work?   '[]'  Yes              '[]'  No   '[x]'  Disabled    '[]'  Retired     Return to work:  Has Dontrey returned to work?  '[]'  Yes    '[]'  No    Return to work date set?  '[]'  Yes, **    '[]'  No    Jaekwon is doing ** at work.     Return to work:  Has Duilio returned to work?  '[]'  Yes    '[]'  No    Return to work date set?  '[]'  Yes, **    '[]'  No    Uzziah is doing ** at work.  Return to work:  Has Baptiste returned to work?  '[]'  Yes    '[]'  No    Return to work date set?  '[]'  Yes, **    '[]'  No    [place] is doing ** at work. Return to work:  Has Jakaleb returned to work?  '[]'  Yes    '[]'  No    Return to  work?  '[]'  Yes, **    '[]'  No    *Required MET Level achieved for job duties?   '[]'  Yes    '[]'  No   Orthopedic Limitations/  '[x]'  Yes    '[]'  No  If yes please list:  Leg injury     Orthopedic Limitations  *If patient has orthopedic issue:   Actions/  accomodations needed to make Shanon Brow successful : Orthopedic Limitations   Orthopedic Limitations   Orthopedic Limitations     Fall Risk  Fall risk assessed?  '[x]'  Yes      '[]'  No    Balance Issues?  '[]'  Yes      '[x]'  No     '[]'  Walker '[]'  Cane    '[x]'  Safety issues reviewed      Fall Risk  *If patient is a fall risk, action needed to accommodate:  Fall Risk Fall Risk Fall Risk   Home Exercise  '[]'  Yes    '[x]'  No   Home Exercise  '[]'  Yes    '[]'  No  Type: **  Frequency:**  Duration: ** Home Exercise  '[]'  Yes    '[]'  No  Type: **  Frequency: **  Duration: ** Home Exercise  '[]'  Yes    '[]'  No  Type: **  Frequency: **  Duration: ** Home Exercise  '[]'  Yes    '[]'  No  Type: **  Frequency: **  Duration: **   Angina with Activity?  '[x]'  Yes    '[]'  No  Angina Management: rest, nitro Angina with Activity?  '[]'  Yes    '[]'  No  Angina Management: ** Angina with Activity?  '[]'  Yes    '[]'  No  Angina Management: ** Angina with Activity?  '[]'  Yes    '[]'  No  Angina Management: ** Angina with Activity?  '[]'  Yes    '[]'  No  Angina Management: **   EXERCISE PLAN EXERCISE PLAN EXERCISE PLAN EXERCISE PLAN EXERCISE PLAN   *Interventions*  *Interventions* *Interventions* *Interventions* *Interventions*   Exercise Prescription  (per physician & CR staff) Exercise Prescription  (per physician & CR staff) Exercise Prescription  (per physician & CR staff) Exercise Prescription  (per physician & CR staff) Exercise Prescription  (per physician & CR staff)   Cardiovascular Cardiovascular Cardiovascular Cardiovascular Cardiovascular   Mode:    '[x]'  Treadmill (TM)  '[x]'  Schwinn Airdyne (AD)  '[x]'  Arms Ergometer (AE)  '[x]'  NuStep  '[]'  Elliptical (E) MODE:    '[]'  Treadmill (TM)  '[]'  Schwinn Airdyne (AD)  '[]'  Arms Ergometer (AE)  '[]'  NuStep  '[]'  Elliptical (E) MODE:    '[]'  Treadmill (TM)  '[]'  Schwinn Airdyne (AD)  '[]'  Arms Ergometer (AE)  '[]'  NuStep  '[]'  Elliptical (E) MODE:    '[]'  Treadmill (TM)  '[]'  Schwinn Airdyne (AD)  '[]'  Arms Ergometer (AE)  '[]'  NuStep  '[]'  Elliptical (E) MODE:    '[]'  Treadmill (TM)  '[]'  Schwinn Airdyne (AD)  '[]'  Arms Ergometer (AE)  '[]'  NuStep  '[]'  Elliptical (E)   Initial Workloads  TM: 2.6'@0' %= 3 METs  AD: 1.0 level = 2.6 METs  NS: 60  Watts= 2.6 METs  AE: 0.5 level = 1.8 METs Current Workloads  TM:  @ %=  METs  AD:  level =  METs  NS:   Watts=  METs  AE:  level =  METs Current Workloads  TM:  @ %=  METs  AD:  level =  METs  NS:   Watts=  METs  AE:  level =  METs Current Workloads  TM:  @ %=  METs  AD:  level =  METs  NS:   Watts=  METs  AE:  level =  METs Current Workloads  TM:  @ %=  METs  AD:  level =  METs  NS:   Watts=  METs  AE:  level =  METs     Frequency:    ICR: 3x/week  Home: 2-3x/wk Frequency:   ICR: 3x/week  Home: 3x/wk Frequency:  ICR: 3x/week  Home: 3-4x/wk Frequency:  ICR: 3x/week  Home: 3-4x/wk Frequency:  Zayd will continue exercise at  5-7 days/week   Duration:   Total aerobic exercise = 20-25 min    8 min/mode Duration:  Total aerobic exercises = ** min     **min/mode Duration:  Total aerobic exercises = ** min     **min/mode Duration:  Total aerobic exercises = ** min     **min/mode Duration:  Total erobic exercise =  60-90 min   Intensity:    MET Level = 2.6  RPE = 12-15 Intensity:  Max MET Level = **  RPE = 12-15 Intensity:  Max MET Level = **  RPE = 12-15 Intensity:  Max MET Level = **  RPE = 12-15 Intensity:  Max MET Level = ** RPE = 12-15   Progression: increase aerobic activity up to 30 min over next 4 weeks by increasing time 2-3 min/week. Progression:   Progression:   Progression: Progression:  Increase time/intensity when RPE <13, and HR is in  [facility]   '[x]'  Yes      '[]'  No  Upper and Lower body strength training 2x/wk    Wt: 2#       Reps:  8-15    *Increase wt. after completing 15 reps with an RPE of <12/13. '[]'  Yes      '[]'  No  Upper and Lower body strength training 2x/wk    Wt: **#       Reps:  8-15    *Increase wt. after completing 15 reps with an RPE of <12/13. '[]'  Yes      '[]'  No  Upper and Lower body strength training 2x/wk    Wt: **#       Reps:  8-15    *Increase wt. after completing 15 reps with an RPE of <12/13. '[]'  Yes      '[]'  No  Upper and Lower body strength training 2x/wk    Wt: **#       Reps:  8-15    *Increase wt. after completing 15 reps with an RPE of <12/13. Continue Strength Training at home   '[]'  Exercise Log & Strength training handout given     Wt: **#       Reps:  8-15    *Increase wt. after completing 15 reps with an RPE of <12/13.   Flexibility Flexibility Flexibility Flexibility Flexibility   '[x]'  Yes      '[]'  No  25 min session of Core Strength & Flexibility 1x/per week  Attends Core Strength & Flexibility   '[]'  Yes      '[]'  No Attends Core Strength & Flexibility   '[]'  Yes      '[]'  No Attends Core Strength & Flexibility   '[]'  Yes      '[]'  No Continue Core Strength & Flexibility at home   Home Exercise  *Merrilee Jansky  planning to walk 3-4 days/week for 5-10 min on days not in rehab. Home Exercise  *Earley verbalizes planning to ** ** days/week for ** min on days not in rehab. Home Exercise  *Orren verbalizes planning to ** ** days/week for  ** min on days not in rehab. Home Exercise  *[place] verbalizes planning to ** ** days/week for ** min on days not in rehab. Home Exercise  *Kendarious verbalizes his/her plan to ** ** days/week for ** min @ **   *Education* *Education* *Education* *Education* *Education*   RPE Scale  '[x]'  Yes      '[]'  No  Exercise Safety  '[x]'  Yes      '[]'  No  Equipment Orientation  '[x]'  Yes      '[]'  No  S/S to Report  '[x]'  Yes      '[]'  No  Warm Up/Cool Down  '[x]'  Yes      '[]'  No  Home Exercise  '[x]'  Yes      '[]'  No    All Exercise Education Completed  '[]'  Yes      '[]'  No   *Goals* *Goals* *Goals* *Goals* *Goals*   Initial Exercise Goals Exercise Goals  Exercise Goals   Exercise Goals  Exercise Goals   Azzan plans to:  '[x]'  Attend exercise sessions 3x/wk  '[x]'  initiate home exercise 2-3x/wk for 10-20 min  '[x]'  Increase 6 min walk distance by 10%  '[]'  Shanon Brow plans to:  '[x]'  Attend exercise sessions 3x/wk  '[x]'  continue home exercise 2-3x/wk for 20-30 min  '[]'  ** Kaye's plans to:  '[x]'  Attend exercise sessions 3x/wk  '[x]'  continue home exercise 3-4x/wk for 30-45 min  '[x]'  Determine plan of exercise following rehab  '[]'  ** Abe's plans to:  Shanon Brow achieved exercise goals?    '[]'   Yes    '[]'  No  If no, why?  **  '[]'  Increased 6 min walk distance by 10%  '[]'  Currently exercising 30-60 min/day, 5-7days/wk   '[]'  Plans to continue exercise on own  '[]'  Plans to join a local fitness center to continue exercise  '[]'  Does not plan to continue to exercise after rehab   Return to ADL or Hobbies:  Jovanni would like to improve strength and endurance so he is able to return to ride motorcycle, housework Return to ADL or Hobbies:  [place] would like to improve strength and endurance so he/she is able to return to ** Return to ADL or Hobbies:  Ramon would like to improve strength and endurance so he/she is able to return to ** Return to ADL or Hobbies:  Reagan would like to improve strength and endurance so he/she is able to return to ** Return to ADL or Hobbies:  Savio would like to  improve strength and endurance so he/she is able to return to **    *MET level required for above goal:  5 METs MET level Achieved:  **METS MET level Achieved:  **METS MET level Achieved:  **METS MET level Achieved:  **METS     Individual Cardiac Treatment Plan - Nutrition  NUTRITION  ASSESSMENT/PLAN NUTRITION  REASSESSMENT NUTRITION   REASSESSMENT NUTRITION   REASSESSMENT NUTRITION  DISCHARGE/FOLLOW-UP   Stages of Change Stages of Change Stages of Change Stages of Change Stages of Change   '[x]'  Pre Contemplation  '[]'  Contemplation  '[]'  Preparation  '[]'  Action  '[]'  Maintenance  '[]'  Relapse '[]'  Pre Contemplation  '[]'  Contemplation  '[]'  Preparation  '[]'  Action  '[]'  Maintenance  '[]'   Relapse '[]'  Pre Contemplation  '[]'  Contemplation  '[]'  Preparation  '[]'  Action  '[]'  Maintenance  '[]'  Relapse '[]'  Pre Contemplation  '[]'  Contemplation  '[]'  Preparation  '[]'  Action  '[]'  Maintenance  '[]'  Relapse '[]'  Pre Contemplation  '[]'  Contemplation  '[]'  Preparation  '[]'  Action  '[]'  Maintenance  '[]'  Relapse   NUTRITION ASSESSMENT NUTRITION ASSESSMENT NUTRITION ASSESSMENT NUTRITION ASSESSMENT NUTRITION ASSESSMENT   Weight Management  Weight: 206.4        Height: 67   BMI: 32.4  Weight Management  Weight: **                  Weight Management  Weight: **                  Weight Management  Weight: ** Weight Management  Weight: **                    BMI: **   Eating Plan  Current eating habits: nothing in particular Eating Plan  Changes: Eating Plan  Changes: Eating Plan  Changes: Eating Plan Improvements:   Alcohol Use  '[]'  none          '[]'  daily  '[x]'  weekly      '[]'  special   Type: wine  Amount: 1       Diet Assessment Tool:  RATE YOUR PLATE  *Given to patient to complete and return. Diet Assessment Tool:    Score: **/69       Diet Assessment Tool: RATE YOUR PLATE  Score: **/22   NUTRITION PLAN NUTRITION PLAN NUTRITION PLAN NUTRITION PLAN NUTRITION PLAN   *Interventions* *Interventions* *Interventions* *Interventions* *Interventions*   Initial Survey given Goal Setting  Discussion:   '[]'  Yes      '[]'  No       Follow Up Survey Reviewed & Goals Updated:     Professional Referral  Please check if needed.  '[]'  Dietician Consult   '[]'  Wt. Management Referral  '[]'  Other:  Professional Referral  Please check if needed.  '[]'  Dietician Consult   '[]'  Wt. Management Referral  '[]'  Other: Professional Referral  Please check if needed.  '[]'  Dietician Consult   '[]'  Wt. Management Referral  '[]'  Other: Professional Referral  Please check if needed.  '[]'  Dietician Consult   '[]'  Wt. Management Referral  '[]'  Other: Professional Referral  Please check if needed.  '[]'  Dietician Consult   '[]'  Wt. Management Referral  '[]'  Other:   *Education* *Education* *Education* *Education* *Education*   Nutritional Education Recommended    '[x]'  1:1 Firefighter    Workshops  '[x]'  Label Reading   '[x]'  Menu  '[x]'  Targeting Nutrition Priorities  '[x]'  Fueling a Designer, multimedia   Nutritional Education Attended/Date Nutritional Education Attended/Date Nutritional Education Attended/Date All Sessions Completed?    '[]'  Yes  '[x]'  No    DECLINED   Cooking School    '[x]'  11 sessions recommended     *Adding Flavor  *Fast & Healthy     Breakfasts  *Salads & Dressings  *Soups & Simple     Sauces  *Simple Sides  *Appetizers &     Snacks  *Delicious Desserts  *Plant Proteins  *Fast Evening Meals  * Weekend Breakfasts  *Cook once, Eat       twice [employer]  Sessions Completed   [employer]  Sessions Completed [employer]  Sessions Completed     [employer]    # of sessions completed:  0  DECLINED   *Goals* *Goals* *Goals* *Goals* *Goals*   Kaylen's nutritional goals are as follows:  Complete and return diet survey Jarel's nutritional goals are as follows:  '[]'  Attend Nutrition Workshops  '[]'  Attend 1:1   '[]'  Attend Cooking Classes  '[]'  ** Fraser's nutritional goals are as follows:  '[]'  Attend Nutrition Workshops  '[]'  Attend 1:1   '[]'  Attend Cooking Classes  '[]'  Complete and return diet survey  '[]'  ** Yoltzin's nutritional goals are as follows:  '[]'   Attend Nutrition Workshops  '[]'  Attend 1:1   '[]'  Attend Cooking Classes  '[]'  ** Farhan achieved nutritional goals   '[]'  Yes    '[]'  No  If no, why?  Use knowledge gained to continue Pritikin eating plan at home       Individual Cardiac Treatment Plan - Psychosocial  PSYCHOSOCIAL  ASSESSMENT/PLAN PSYCHOSOCIAL  REASSESSMENT PSYCHOSOCIAL   REASSESSMENT PSYCHOSOCIAL   REASSESSMENT PSYCHOSOCIAL  DISCHARGE/FOLLOW-UP   Stages of Change Stages of Change Stages of Change Stages of Change Stages of Change   '[]'  Pre Contemplation  '[x]'  Contemplation  '[]'  Preparation  '[]'  Action  '[]'  Maintenance  '[]'  Relapse '[]'  Pre Contemplation  '[]'  Contemplation  '[]'  Preparation  '[]'  Action  '[]'  Maintenance  '[]'  Relapse '[]'  Pre Contemplation  '[]'  Contemplation  '[]'  Preparation  '[]'  Action  '[]'  Maintenance  '[]'  Relapse '[]'  Pre Contemplation  '[]'  Contemplation  '[]'  Preparation  '[]'  Action  '[]'  Maintenance  '[]'  Relapse '[]'  Pre Contemplation  '[]'  Contemplation  '[]'  Preparation  '[]'  Action  '[]'  Maintenance  '[]'  Relapse   PSYCHOSOCIAL ASSESSMENT PSYCHOSOCIAL ASSESSMENT PSYCHOSOCIAL ASSESSMENT PSYCHOSOCIAL ASSESSMENT PSYCHOSOCIAL ASSESSMENT   Behavioral Outcomes Behavioral Outcomes Behavioral Outcomes Behavioral Outcomes Behavioral Outcomes   Tool Used:  Ferrans & Powers, Quality of Life Index, Cardiac Version IV  *Given to patient to complete. Tool Used:    Ferrans & Powers, Quality of Life Index, Cardiac Version IV     QOL Index Score: **  HF:**  S&E:**  P&S: **  Family: **   Tool Used:     Ferrans & Powers, Quality of Life Index, Cardiac Version IV    QOL Index Score: **  HF:**  S&E:**  P&S: **  Family: **   PHQ-9 score **  Depression Severity  '[]' Minimal  '[]' Mild   '[]' Moderate  '[]' Moderately Severe  '[]' Severe    PHQ-9 score **  Depression Severity  '[]' Minimal  '[]' Mild   '[]' Moderate  '[]' Moderately Severe '[]' Severe   Does patient have Family Support?  '[]'  Yes      '[x]'  No  No signs of marital/family distress       Within the Past Month:  *Have you wished you were dead or wished you could go to  sleep and not wake up?  '[]'  Yes      '[x]'  No  *Have you had any thoughts of killing yourself?  '[]'  Yes      '[x]'  No         Using a scale of 0-10, 0=none, 10=very:   Rate your depression: 9  Rate your anxiety:  8  Using a scale of 0-10, 0=none, 10=very:   Rate your depression: **  Rate your anxiety:  ** Using a scale of 0-10, 0=none, 10=very:   Rate your depression: **  Rate your anxiety:  ** Using a scale of 0-10, 0=none, 10=very:   Rate your depression: **  Rate your anxiety:  ** Using a scale of 0-10, 0=none, 10=very:   Rate your depression: **  Rate your anxiety:  **   Signs and Symptoms of Depression Present?    '[x]'  Yes      '[]'  No  If yes, please explain:  Pt upset because air conditioning not working. Signs and Symptoms of Depression Present?    '[]'  Yes      '[]'  No  If yes, please explain:  ** Signs and Symptoms of Depression Present?    '[]'  Yes      '[]'  No  If yes, please explain:  ** Signs and Symptoms of Depression Present?    '[]'  Yes      '[]'  No  If yes, please explain:  ** Signs and Symptoms of Depression Present?    '[]'  Yes      '[]'  No  If yes, please explain:  **   Signs and Symptoms of Anxiety Present?    '[x]'  Yes      '[]'  No  If yes, please explain:  See above Signs and Symptoms of Anxiety Present?    '[]'  Yes      '[]'  No  If yes, please explain:  ** Signs and Symptoms of Anxiety Present?    '[]'  Yes      '[]'  No  If yes, please explain:  ** Signs and Symptoms of Anxiety Present?    '[]'  Yes      '[]'  No  If yes, please explain:  ** Signs and Symptoms of Anxiety Present?    '[]'  Yes      '[]'  No  If yes, please explain:  **   '[]'  Patient has poor eye contact   '[]'  Flat affect present.  '[x]'  Signs of anxiety, anger or hostility    '[x]'  Signs social isolation present.   '[]'   Signs of alcohol or substance abuse       PSYCHOSOCIAL PLAN PSYCHOSOCIAL PLAN PSYCHOSOCIAL PLAN PSYCHOSOCIAL PLAN PSYCHOSOCIAL PLAN   *Interventions* *Interventions* *Interventions* *Interventions* *Interventions*   *Please check if needed  '[]'  Psych Consult  '[x]'   Physician Referral- followed by physicin and seeing psychologist  '[x]'  Stress Management Skills *Please check if needed  '[]'  Psych Consult  '[]'  Physician Referral  '[]'  Stress Management Skills *Please check if needed  '[]'  Psych Consult  '[]'  Physician Referral  '[]'  Stress Management Skills *Please check if needed  '[]'  Psych Consult  '[]'  Physician Referral  '[]'  Stress Management Skills *Please check if needed  '[]'  Psych Consult  '[]'  Physician Referral  '[]'  Stress Management Skills   Is patient currently taking anti-depressant or anti-anxiety medications?  '[x]'  Yes      '[]'  No  If yes, please list medications:  Buspirone, paroxetine Change in anti-depressant or anti-anxiety medications?  '[]'  Yes      '[]'  No  If yes, please list medications:  ** Change in anti-depressant or anti-anxiety medications?  '[]'  Yes      '[]'  No  If yes, please list medications:  ** Change in anti-depressant or anti-anxiety medications?  '[]'  Yes      '[]'  No  If yes, please list medications:  ** Change in anti-depressant or anti-anxiety medications?  '[]'  Yes      '[]'  No  If yes, please list medications:  **   *Education* *Education* *Education* *Education* *Education*   Healthy Mind-Set Workshops Recommended  '[x]'  Stress & Health  '[x]'  Taking Diplomatic Services operational officer of Stress  '[x]'  New Thoughts, New Behaviors  '[x]'  Managing Moods & Relationships Healthy Mind-Set Workshops Attended/Date Healthy Mind-Set Workshops Attended/Date Healthy Mind-Set Workshops Attended/Date [employer]  Completed  '[]'  Yes      '[x]'  No    DECLINED   *Goals* *Goals* *Goals* *Goals* *Goals*   Kroy's psychosocial goals are as follows:  Complete HADS & Ferrans & Powers, Quality of Life Index, Cardiac Version IV Deangleo's psychosocial goals are as follows:  '[]'  Attend Healthy Mind-Set Workshops  '[]'  Reduce depression symptom severity by 1 level  '[]'  ** Kyshon's psychosocial goals are as follows:  '[]'  Attend Healthy Mind-Set Workshops  '[]'  Reduce depression symptom severity by 1 level  '[]'  ** Dale's psychosocial  goals are as follows:  '[]'  Attend Healthy Mind-Set Workshops  '[]'  Reduce depression symptom severity by 1 level  '[]'  Shanon Brow achieved psychosocial goals?  '[]'  Yes    '[]'  No  If no, why?  **  '[]'  Use methods learned to continue to reduce depression symptom severity by 1 level  '[]'  **     Individual Cardiac Treatment Plan - Other:  Risk Factor/Education  RISK FACTOR  ASSESSMENT/PLAN RISK FACTOR  REASSESSMENT  RISK FACTOR  REASSESSMENT RISK FACTOR  REASSESSMENT RISK FACTOR   DISCHARGE/FOLLOW-UP   Stages of Change Stages of Change Stages of Change Stages of Change Stages of Change   '[x]'  Pre Contemplation  '[]'  Contemplation  '[]'  Preparation  '[]'  Action  '[]'  Maintenance  '[]'  Relapse '[]'  Pre Contemplation  '[]'  Contemplation  '[]'  Preparation  '[]'  Action  '[]'  Maintenance  '[]'  Relapse '[]'  Pre Contemplation  '[]'  Contemplation  '[]'  Preparation  '[]'  Action  '[]'  Maintenance  '[]'  Relapse '[]'  Pre Contemplation  '[]'  Contemplation  '[]'  Preparation  '[]'  Action  '[]'  Maintenance  '[]'  Relapse '[]'  Pre Contemplation  '[]'  Contemplation  '[]'  Preparation  '[]'  Action  '[]'  Maintenance  '[]'  Relapse   RISK FACTOR/EDUCATION ASSESSMENT RISK FACTOR/EDUCATION ASSESSMENT RISK FACTOR/EDUCATION ASSESSMENT RISK FACTOR/EDUCATION ASSESSMENT RISK FACTOR /EDUCATION ASSESSMENT   Hypertension  '[x]'  Yes      '[]'  No    Resting BP: 108/68  Peak Ex BP:128/68  Medication: lisinopril, metoprolol   Hypertension  Resting BP: **  Peak Ex BP:**  Medication Changes:  '[]'  Yes      '[]'  No Hypertension  Resting BP: **  Peak Ex BP:**  Medication Changes:  '[]'  Yes      '[]'  No Hypertension  Resting BP: **  Peak Ex BP:**  Medication Changes:  '[]'  Yes      '[]'  No Hypertension  Resting BP: **  Peak Ex BP:**  Medication Changes:  '[]'  Yes      '[]'  No   Lipids  HLD/DLD  '[x]'  Yes      '[]'  No  TOTAL CHOL: 156  HDL:  31  LDL:  91  TRIG:  168  Medication: atorvastatin, fenofibrate Lipids  Medication Changes:  '[]'  Yes      '[]'  No     Lipids  Medication Changes:  '[]'  Yes      '[]'  No     Lipids  Medication Changes:  '[]'  Yes      '[]'  No      Lipids    TOTAL CHOL: **  HDL:  **  LDL:  **  TRIG:  **  Medication Changes:  '[]'  Yes      '[]'  No   Diabetes  '[x]'  Yes      '[]'  No  FBS: 89                  Monitor BS @ home:   '[x]'  Yes      '[]'   No  Frequency: rarely  Medication: glimepiride, metformin Diabetes  Most Recent BS:  BS have been in range  '[]'  Yes      '[]'  No  Medication Changes  '[]'  Yes      '[]'  No   Diabetes  Most Recent BS:  BS have been in range  '[]'  Yes      '[]'  No  Medication Changes  '[]'  Yes      '[]'  No     Diabetes  Most Recent BS:  BS have been in range  '[]'  Yes      '[]'  No  Medication Changes  '[]'  Yes      '[]'  No     Diabetes  Most Recent BS:  BS have been in range  '[]'  Yes      '[]'  No  Medication Changes  '[]'  Yes      '[]'  No       Tobacco Use  '[]'  Current  '[x]'  Former  '[]'  Never    Years smoked: 35:    Date Quit: 2008    # cigarettes smoked/day: 1pk  Smokeless Tobacco use:   '[]'  Yes      '[x]'  No   Tobacco Use  Change in smoking status   '[]'  Yes      '[]'  No    Quit date: **   Tobacco Use  Change in smoking status   '[]'  Yes      '[]'  No    Quit date: **   Tobacco Use  Change in smoking status   '[]'  Yes      '[]'  No    Quit date: ** Tobacco Use  Change in smoking status   '[]'  Yes      '[]'  No    Quit date: **            Learning Barriers  Please select one:  '[]'  Speech  '[]'  Literacy  '[]'  Hearing  '[]'  Cognitive  '[x]'  Vision  '[x]'  Ready to Learn Learning Barriers Addressed:   '[]'  Yes      '[]'  No   Learning Barriers Addressed:   '[]'  Yes      '[]'  No   Learning Barriers Addressed:  '[]'  Yes      '[]'  No Learning Barriers Addressed:  '[]'  Yes      '[]'  No     RISK FACTOR/EDUCATION PLAN RISK FACTOR/EDUCATION PLAN RISK FACTOR/EDUCATION PLAN RISK FACTOR/EDUCATION PLAN RISK FACTOR/EDUCATION PLAN   *Interventions* *Interventions* *Interventions* *Interventions* *Interventions*   Recommended Educational Videos    '[x]'  Intake & The Pritikin Solution Program Overview  '[x]'  Overview of The Pritikin Eating Plan  '[x]'  Becoming A Pritikin Chef  '[x]'  Diseases of Our Time-Part 1  '[x]'  Calorie Density  '[x]'  Label Reading-Part  1  '[x]'  Move It!  '[x]'  Healthy Minds, Bodies, Hearts  '[x]'  Dining Out-Part 1  '[x]'  Heart Disease Risk Reduction  '[x]'  Metabolic Syndrome & Belly Fat  '[x]'  Facts on Fat  '[x]'  Diseases of Our Times-Part 2  '[x]'  Biology of Weight Control  '[x]'  Biomechanical Limitations  '[x]'  Nurtition Action Plan   Completed Videos      07/28/16  Overview of The Pritikin Eating Plan Completed Videos Completed Videos Recommended Educational Videos Completed    '[]'  Yes      '[x]'  No    **If not completed, Why? DECLINED          Smoking Cessation/Relaspe Prevention Intervention needed?  '[]'  Yes      '[x]'  No  Smoking Cessation Counseling attended  '[]'  Yes      '[]'  No  **If not completed, Why? **   Professional Referrals:  *Please check if needed  '[]'  Diabetes Clinic  '[]'  Lipid Clinic   '[]'  Other:     Professional Referrals:  *Please check if needed  '[]'  Diabetes Clinic  '[]'  Lipid Clinic   '[]'  Other:   Preventative Medication Preventative Medication Preventative Medication Preventative Medication Preventative Medication   Aspirin  '[x]'  Yes    '[]'  No  Blood Thinner: Clopidogrel/Effient/Brillinta  '[x]'  Yes    '[]'  No  Beta Blocker  '[x]'  Yes    '[]'  No  Ace Inhibitor  '[x]'  Yes    '[]'  No  Statin/Lipid Lowering  '[x]'  Yes    '[]'  No Medication Changes?  '[]'  Yes    '[]'  No Medication Changes?  '[]'  Yes    '[]'  No Medication Changes?  '[]'  Yes    '[]'  No Medication Changes?  '[]'  Yes    '[]'  No   *Education* *Education* *Education* *Education* *Education*   Does Juanmiguel require any additional education?  '[]'  Yes    '[x]'  No   Does Camran require any additional education?  '[]'  Yes    '[]'  No Does Raymel require any additional education?  '[]'  Yes    '[]'  No Does Zebulon require any additional education?  '[]'  Yes    '[]'  No Does Jamion require any additional education?  '[]'  Yes    '[]'  No   Recommended Additional Educational Videos    Medical  '[]'  Hypertension & Heart Disease  '[]'  Decoding Lab Results  '[]'  Aging Enhancing Your QoL  '[]'  Sleep Disorders  Exercise  '[]'  Body Composition  '[]'  Improve Performance  '[]'  Exercise  Action Plan  '[]'  Intro to Yoga  Behavioral  '[]'  How Our Thoughts Can Heal Our Hearts  '[]'  Smoking Cessation  Nutrition  '[]'  Planning Your Eating Strategy  '[]'  Lable Reading- Part 2  '[]'  Dining Out - Part 2  '[]'  Targeting Your Nutrition Priorities  '[]'  Fueling a Healthy Body  '[]'  Menu Workshop  [employer] '[]'  Breakfast & Snacks  '[]'  Healhty Salads & Dressing  '[]'  Dinner & Sides  '[]'  Soups & Corporate investment banker Completed    '[]'  Yes    '[x]'  No   *Goals* *Goals* *Goals* *Goals* *Goals*   Stephanie's risk factor/education goals are as follows:    '[x]'  Optimal BP <140/90  '[x]'  Blood Sugar <120  '[x]'  Attend recommended video education sessions  '[x]'  Takes medications as prescribed 100% of the time   '[]'  ** Jatin's risk factor/education goals are as follows:    '[x]'  Optimal BP <140/90  '[]'  Blood Sugar <120  '[x]'  Attend recommended video education sessions  '[x]'  Takes medications as prescribed 100% of the time   '[]'  ** Delsin's risk factor/education goals are as follows:    '[x]'  Optimal BP <140/90  '[]'  Blood Sugar <120  '[x]'  Attend recommended video education sessions  '[x]'  Takes medications as prescribed 100% of the time   '[]'  ** Winifred's risk factor/education goals are as follows:    '[x]'  Optimal BP <140/90  '[]'  Blood Sugar <120  '[x]'  Attend recommended video education sessions  '[x]'  Takes medications as prescribed 100% of the time   '[]'  Shanon Brow achieved risk factor goals?  '[]'  Yes    '[]'  No  If no, why?  **  Monitored telemetry has revealed: NSR  * Monitored telemetry has revealed **  '[]'  documented arrhythmia at increasing workloads  '[]'  associated symptoms ** Monitored telemetry has revealed  '[]'  documented arrhythmia at increasing workloads  '[]'  associated symptoms ** Monitored telemetry has revealed **  '[]'  documented arrhythmia at increasing workloads  '[]'  associated symptoms ** Monitored telemetry has  revealed **  '[]'  documented arrhythmia at increasing workloads  '[]'  associated symptoms **   Physician Response    '[x]'  Cardiac rehab is reasonably and medically necessary for continuous cardiac monitoring surveillance  of patient's cardiac activity  '[x]'  Initiate continuous telemerty monitoring and notify me with any concerns  '[]'  Other   Physician Response    '[x]'  Cardiac rehab is reasonably and medically necessary for continuous cardiac monitoring surveillance  of patient's cardiac activity  '[x]'  Continue continuous telemerty monitoring and notify me with any concerns  '[]'  Other     Physician Response      '[x]'  Cardiac rehab is reasonably and medically necessary for continuous cardiac monitoring surveillance  of patient's cardiac activity  '[x]'  Continue continuous telemerty monitoring and notify me with any concerns   '[]'  Other     Physician Response    '[x]'  Cardiac rehab is reasonably and medically necessary for continuous cardiac monitoring surveillance  of patient's cardiac activity  '[x]'  Continue continuous telemerty monitoring and notify me with any concerns   '[]'  Other

## 2016-07-28 NOTE — Progress Notes (Signed)
Video Education Report - ICR/CR    Name:  Micheal BirminghamDavid A [name]     Date:  07/28/2016  MRN: [id number]     Session #:  1  Session Length: 45 min    Recommended Videos        [] 01 Pritikin [employer] - Program Overview   34:22    [x] 02 Overview of Pritikin Eating Plan   34:10    [] 03 Becoming a Pritikin Chef   33:08     [] 04 Diseases of Our Time - Part 1   34:22    [] 05 Calorie Density     33:39   [] 06 Label Reading - Part 1    32:15   [] 07 Move it      32.54   [] 08 Healthy Minds, Bodies, Hearts   32:14   [] 09 Dining Out - Part 1    32:28   [] 10 Heart Disease Risk Reduction   32:13   [] 11 Metabolic Syndrome and Belly Fat  31:52   [] 12 Facts on Fat     35:29   [] 13 Diseases of Our Time - Part 2   33:07   [] 14 Biology of Weight Control   32:36   [] 15 Biomechanical Limitations   35:20   [] 16 Nutrition Action Plan    34:23        Comments:  Video completed, Reviewed Pritikin binder

## 2016-07-28 NOTE — Telephone Encounter (Signed)
Paperwork and letter in tray

## 2016-08-03 ENCOUNTER — Inpatient Hospital Stay: Payer: MEDICARE | Primary: Family

## 2016-08-03 ENCOUNTER — Inpatient Hospital Stay: Admit: 2016-08-03 | Payer: MEDICARE | Primary: Family

## 2016-08-04 ENCOUNTER — Ambulatory Visit: Admit: 2016-08-04 | Discharge: 2016-08-04 | Payer: MEDICARE | Attending: Clinical | Primary: Family

## 2016-08-04 DIAGNOSIS — F410 Panic disorder [episodic paroxysmal anxiety] without agoraphobia: Secondary | ICD-10-CM

## 2016-08-04 NOTE — Patient Instructions (Addendum)
1.  Get up after 15 min of not being able to sleep, go to another room, and do a relaxing activity and return to bed when you feel sleepy.      2.  Remember the heat is a trigger for anxious and irritable behavior.  You literally need to cool off when you feel that way.  Take a cool shower.    3.  No follow up needed with Dr. Ivy LynnBrickner at this time.  Can call in future if needed.

## 2016-08-04 NOTE — Progress Notes (Addendum)
Psychotherapy Note  Deeann DowseKurt A. Ernie Kasler, Psy.D.  Visit Date:  08/04/2016    Patient:  Micheal [name]  Date of birth:  1955-08-30  Chief Complaint:  Follow-up; Anxiety (Panic); and Insomnia    Duration of session:  30 minutes      S:     He said his A/C was broke for a week and his anxiety was up then, but it's better now.  No other panic attacks or high anxiety.  He said he has been going to bed at 10pm, but usually wakes up about 2am and can't get back to sleep.  We discussed some strategies for helping with anxiety and insomnia.  Overall though, he's doing well and is able to manage on his own at this time.        O:    Appearance alert, cooperative, no distress  Appetite normal  Sleep disturbance No  Loss of pleasure No  Speech spontaneous, normal rate, normal volume and well articulated  Mood Euthymic, pleasant  Affect normal  Thought Content intact  Insight Fair  Judgment Intact  Suicide Assessment no suicidal ideation      A:    1. 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  He said he's had no panic attacks in the past month and feels much happier about things.  He was able to verbalize coping skills he needs to manage the anxiety.  [facility]      P:    1.  Get up after 15 min of not being able to sleep, go to another room, and do a relaxing activity and return to bed when you feel sleepy.      2.  Remember the heat is a trigger for anxious and irritable behavior.  You literally need to cool off when you feel that way.  Take a cool shower.    3.  No follow up needed with Dr. Ivy LynnBrickner at this time.  Can call in future if needed.    All questions about treatment plan answered.Patient instructed to go immediately to the emergency room and/or call 911 if any suicidal or homicidal ideations. Patient stated understanding and is agreeable  to treatment and crisis plan.          Provider Signature:  Electronically signed by Rica RecordsKurt Marijah Larranaga, PSY.D on 08/04/2016 at 2:19 PM

## 2016-08-05 ENCOUNTER — Inpatient Hospital Stay: Admit: 2016-08-05 | Payer: MEDICARE | Primary: Family

## 2016-08-05 ENCOUNTER — Inpatient Hospital Stay: Payer: MEDICARE | Primary: Family

## 2016-08-05 NOTE — Addendum Note (Signed)
Addended by: Rica RecordsBRICKNER, Kawon Willcutt A on: 08/05/2016 08:35 AM     Modules accepted: Level of Service

## 2016-08-08 ENCOUNTER — Inpatient Hospital Stay: Payer: MEDICARE | Primary: Family

## 2016-08-08 NOTE — Progress Notes (Signed)
Hospital Facility-Based Program  Phase 2 Cardiac Rehab Weekly Progress Report      Patient prescribed exercise:  10:00 class.  3 times per week in rehab, 1-4 times per week at home for the amount of sessions/weeks specified by insurance.    Current Levels: Treadmill: 2.376mph/0% for 8 minutes, Schwinn Airdyne: Level 1.0 for 8 minutes,UBE: Level 0.5 for 5 minutes.    Progression Discussion: Maintain/Increase Aerobic exercise 15 minutes to work on endurance.  Attempt to increase intensity by 5-20% for each modality this week.  Try to increase intensities until Micheal [name] rates the exercises a 13-17 on Borg RPE.

## 2016-08-09 ENCOUNTER — Inpatient Hospital Stay: Payer: MEDICARE | Primary: Family

## 2016-08-10 ENCOUNTER — Encounter: Payer: MEDICARE | Primary: Family

## 2016-08-10 NOTE — Plan of Care (Signed)
[facility]  Cardiac Rehab Program - [facility] Facility-Based Program  Individualized Cardiac Treatment Plan    Patient Name:  Micheal [name]  DOB:  1955/12/14  Age:  61 y.o.  MRN:  [id number]  Diagnosis: PCI:LCX, RCA  Date of Event: 07/04/16   Physician:  [place]/ Baki  Next Office Visit:  11/18  Date Entered Program: 07/28/16  Risk Stratifications: '[]'  Low '[]'  Intermediate '[x]'  High  Allergies: No Known Allergies  **08/10/2016  Deandrea has decided to decline all education sessions, therefore he will be switched to TCR instead of ICR.  Electronically signed by Margaretmary Eddy    08/10/17 Shanon Brow called and has decided to quit cardiac rehab. Electronically signed by Einar Pheasant on 08/10/2016 at 10:16 AM    Individual Cardiac Treatment Plan -EXERCISE  INITIAL 19 DAY 60 DAY 90 DAY FINAL DAY   EXERCISE  ASSESSMENT/PLAN EXERCISE  REASSESSMENT EXERCISE   REASSESSMENT EXERCISE   REASSESSMENT EXERCISE  DISCHARGE/FOLLOW-UP   Date: 07/28/16 Date: Date: Date: Date:   Session #1 Session # ** Session # ** Session # ** Session # **  Last session completed on **   Stages of Change Stages of Change Stages of Change Stages of Change Stages of Change   '[x]'  Pre Contemplation  '[]'  Contemplation  '[]'  Preparation  '[]'  Action  '[]'  Maintenance  '[]'  Relapse '[]'  Pre Contemplation  '[]'  Contemplation  '[]'  Preparation  '[]'  Action  '[]'  Maintenance  '[]'  Relapse '[]'  Pre Contemplation  '[]'  Contemplation  '[]'  Preparation  '[]'  Action  '[]'  Maintenance  '[]'  Relapse '[]'  Pre Contemplation  '[]'  Contemplation  '[]'  Preparation  '[]'  Action  '[]'  Maintenance  '[]'  Relapse '[]'  Pre Contemplation  '[]'  Contemplation  '[]'  Preparation  '[]'  Action  '[]'  Maintenance  '[]'  Relapse   EXERCISE ASSESSMENT EXERCISE ASSESSMENT EXERCISE ASSESSMENT EXERCISE ASSESSMENT EXERCISE ASSESSMENT   6 Min Walk Test  Distance walked:   0.21 miles  1108.8 ft.  2.6 METs  Max HR: 120 BPM      RPE:  13    THR:  111-127  Rhythm:  NSR    6 Min Walk Test  Distance walked:   ** miles  ** ft  ** METs  Max HR:**  BPM      RPE:  **  %Change ft= **    Rhythm:  **   DASI: 4.7 METS DASI: ** METS DASI: ** METS DASI: ** METS DASI: ** METS   Return to Work  [employer] on returning to work?   '[]'  Yes              '[]'  No   '[x]'  Disabled    '[]'  Retired     Return to work:  Has Matson returned to work?  '[]'  Yes    '[]'  No    Return to work date set?  '[]'  Yes, **    '[]'  No    Arvis is doing ** at work.     Return to work:  Has Dontrail returned to work?  '[]'  Yes    '[]'  No    Return to work date set?  '[]'  Yes, **    '[]'  No    Linh is doing ** at work.  Return to work:  Has Emani returned to work?  '[]'  Yes    '[]'  No    Return to work date set?  '[]'  Yes, **    '[]'  No    July is doing **  at work. Return to work:  Has Pascal returned to work?  '[]'  Yes    '[]'  No    Return to work?  '[]'  Yes, **    '[]'  No    *Required MET Level achieved for job duties?   '[]'  Yes    '[]'  No   Orthopedic Limitations/  '[x]'  Yes    '[]'  No  If yes please list:  Leg injury     Orthopedic Limitations  *If patient has orthopedic issue:   Actions/  accomodations needed to make Shanon Brow successful : Orthopedic Limitations   Orthopedic Limitations   Orthopedic Limitations     Fall Risk  Fall risk assessed?  '[x]'  Yes      '[]'  No    Balance Issues?  '[]'  Yes      '[x]'  No     '[]'  Walker '[]'  Cane    '[x]'  Safety issues reviewed      Fall Risk  *If patient is a fall risk, action needed to accommodate:  Fall Risk Fall Risk Fall Risk   Home Exercise  '[]'  Yes    '[x]'  No   Home Exercise  '[]'  Yes    '[]'  No  Type: **  Frequency:**  Duration: ** Home Exercise  '[]'  Yes    '[]'  No  Type: **  Frequency: **  Duration: ** Home Exercise  '[]'  Yes    '[]'  No  Type: **  Frequency: **  Duration: ** Home Exercise  '[]'  Yes    '[]'  No  Type: **  Frequency: **  Duration: **   Angina with Activity?  '[x]'  Yes    '[]'  No  Angina Management: rest, nitro Angina with Activity?  '[]'  Yes    '[]'  No  Angina Management: ** Angina with Activity?  '[]'  Yes    '[]'  No  Angina Management: ** Angina with Activity?  '[]'  Yes    '[]'  No  Angina Management: ** Angina with  Activity?  '[]'  Yes    '[]'  No  Angina Management: **   EXERCISE PLAN EXERCISE PLAN EXERCISE PLAN EXERCISE PLAN EXERCISE PLAN   *Interventions* *Interventions* *Interventions* *Interventions* *Interventions*   Exercise Prescription  (per physician & CR staff) Exercise Prescription  (per physician & CR staff) Exercise Prescription  (per physician & CR staff) Exercise Prescription  (per physician & CR staff) Exercise Prescription  (per physician & CR staff)   Cardiovascular Cardiovascular Cardiovascular Cardiovascular Cardiovascular   Mode:    '[x]'  Treadmill (TM)  '[x]'  Schwinn Airdyne (AD)  '[x]'  Arms Ergometer (AE)  '[x]'  NuStep  '[]'  Elliptical (E) MODE:    '[]'  Treadmill (TM)  '[]'  Schwinn Airdyne (AD)  '[]'  Arms Ergometer (AE)  '[]'  NuStep  '[]'  Elliptical (E) MODE:    '[]'  Treadmill (TM)  '[]'  Schwinn Airdyne (AD)  '[]'  Arms Ergometer (AE)  '[]'  NuStep  '[]'  Elliptical (E) MODE:    '[]'  Treadmill (TM)  '[]'  Schwinn Airdyne (AD)  '[]'  Arms Ergometer (AE)  '[]'  NuStep  '[]'  Elliptical (E) MODE:    '[]'  Treadmill (TM)  '[]'  Schwinn Airdyne (AD)  '[]'  Arms Ergometer (AE)  '[]'  NuStep  '[]'  Elliptical (E)   Initial Workloads  TM: 2.6'@0' %= 3 METs  AD: 1.0 level = 2.6 METs  NS: 60  Watts= 2.6 METs  AE: 0.5 level = 1.8 METs Current Workloads  TM:  @ %=  METs  AD:  level =  METs  NS:   Watts=  METs  AE:  level =  METs Current  Workloads  TM:  @ %=  METs  AD:  level =  METs  NS:   Watts=  METs  AE:  level =  METs Current Workloads  TM:  @ %=  METs  AD:  level =  METs  NS:   Watts=  METs  AE:  level =  METs Current Workloads  TM:  @ %=  METs  AD:  level =  METs  NS:   Watts=  METs  AE:  level =  METs     Frequency:    ICR: 3x/week  Home: 2-3x/wk Frequency:   ICR: 3x/week  Home: 3x/wk Frequency:  ICR: 3x/week  Home: 3-4x/wk Frequency:  ICR: 3x/week  Home: 3-4x/wk Frequency:  Dalbert will continue exercise at  5-7 days/week   Duration:   Total aerobic exercise = 20-25 min    8 min/mode Duration:  Total aerobic exercises = ** min     **min/mode Duration:  Total aerobic exercises =  ** min     **min/mode Duration:  Total aerobic exercises = ** min     **min/mode Duration:  Total erobic exercise =  60-90 min   Intensity:   MET Level = 2.6  RPE = 12-15 Intensity:  Max MET Level = **  RPE = 12-15 Intensity:  Max MET Level = **  RPE = 12-15 Intensity:  Max MET Level = **  RPE = 12-15 Intensity:  Max MET Level = ** RPE = 12-15   Progression: increase aerobic activity up to 30 min over next 4 weeks by increasing time 2-3 min/week. Progression:   Progression:   Progression: Progression:  Increase time/intensity when RPE <13, and HR is in [facility]   '[x]'  Yes      '[]'  No  Upper and Lower body strength training 2x/wk    Wt: 2#       Reps:  8-15    *Increase wt. after completing 15 reps with an RPE of <12/13. '[]'  Yes      '[]'  No  Upper and Lower body strength training 2x/wk    Wt: **#       Reps:  8-15    *Increase wt. after completing 15 reps with an RPE of <12/13. '[]'  Yes      '[]'  No  Upper and Lower body strength training 2x/wk    Wt: **#       Reps:  8-15    *Increase wt. after completing 15 reps with an RPE of <12/13. '[]'  Yes      '[]'  No  Upper and Lower body strength training 2x/wk    Wt: **#       Reps:  8-15    *Increase wt. after completing 15 reps with an RPE of <12/13. Continue Strength Training at home   '[]'  Exercise Log & Strength training handout given     Wt: **#       Reps:  8-15    *Increase wt. after completing 15 reps with an RPE of <12/13.   Flexibility Flexibility Flexibility Flexibility Flexibility   '[x]'  Yes      '[]'  No  25 min session of Core Strength & Flexibility 1x/per week  Attends Core Strength & Flexibility   '[]'  Yes      '[]'  No Attends Core Strength & Flexibility   '[]'  Yes      '[]'  No Attends Core Strength & Flexibility   '[]'   Yes      '[]'  No Continue Core Strength & Flexibility at home   Home Exercise  *haruki arnold planning to walk 3-4 days/week for 5-10 min on days not in rehab. Home  Exercise  *Acy verbalizes planning to ** ** days/week for ** min on days not in rehab. Home Exercise  *Kiree verbalizes planning to ** ** days/week for ** min on days not in rehab. Home Exercise  *Jaimie verbalizes planning to ** ** days/week for ** min on days not in rehab. Home Exercise  *Tristyn verbalizes his/her plan to ** ** days/week for ** min @ **   *Education* *Education* *Education* *Education* *Education*   RPE Scale  '[x]'  Yes      '[]'  No  Exercise Safety  '[x]'  Yes      '[]'  No  Equipment Orientation  '[x]'  Yes      '[]'  No  S/S to Report  '[x]'  Yes      '[]'  No  Warm Up/Cool Down  '[x]'  Yes      '[]'  No  Home Exercise  '[x]'  Yes      '[]'  No    All Exercise Education Completed  '[]'  Yes      '[]'  No   *Goals* *Goals* *Goals* *Goals* *Goals*   Initial Exercise Goals Exercise Goals  Exercise Goals   Exercise Goals  Exercise Goals   Aydenn plans to:  '[x]'  Attend exercise sessions 3x/wk  '[x]'  initiate home exercise 2-3x/wk for 10-20 min  '[x]'  Increase 6 min walk distance by 10%  '[]'  ** Belton plans to:  '[x]'  Attend exercise sessions 3x/wk  '[x]'  continue home exercise 2-3x/wk for 20-30 min  '[]'  ** Filemon's plans to:  '[x]'  Attend exercise sessions 3x/wk  '[x]'  continue home exercise 3-4x/wk for 30-45 min  '[x]'  Determine plan of exercise following rehab  '[]'  ** Ojani's plans to:  Shanon Brow achieved exercise goals?    '[]'   Yes    '[]'  No  If no, why?  **  '[]'  Increased 6 min walk distance by 10%  '[]'  Currently exercising 30-60 min/day, 5-7days/wk   '[]'  Plans to continue exercise on own  '[]'  Plans to join a local fitness center to continue exercise  '[]'  Does not plan to continue to exercise after rehab   Return to ADL or Hobbies:  Latravis would like to improve strength and endurance so he is able to return to ride motorcycle, housework Return to ADL or Hobbies:  Mostyn would like to improve strength and endurance so he/she is able to return to ** Return to ADL or Hobbies:  Melquisedec would like to improve strength and endurance so he/she is able to return to **  Return to ADL or Hobbies:  Corde would like to improve strength and endurance so he/she is able to return to ** Return to ADL or Hobbies:  Ishaq would like to improve strength and endurance so he/she is able to return to **    *MET level required for above goal:  5 METs MET level Achieved:  **METS MET level Achieved:  **METS MET level Achieved:  **METS MET level Achieved:  **METS     Individual Cardiac Treatment Plan - Nutrition  NUTRITION  ASSESSMENT/PLAN NUTRITION  REASSESSMENT NUTRITION   REASSESSMENT NUTRITION   REASSESSMENT NUTRITION  DISCHARGE/FOLLOW-UP   Stages of Change Stages of Change Stages of Change Stages of Change Stages of Change   '[x]'  Pre Contemplation  '[]'  Contemplation  '[]'  Preparation  '[]'  Action  '[]'   Maintenance  '[]'  Relapse '[]'  Pre Contemplation  '[]'  Contemplation  '[]'  Preparation  '[]'  Action  '[]'  Maintenance  '[]'  Relapse '[]'  Pre Contemplation  '[]'  Contemplation  '[]'  Preparation  '[]'  Action  '[]'  Maintenance  '[]'  Relapse '[]'  Pre Contemplation  '[]'  Contemplation  '[]'  Preparation  '[]'  Action  '[]'  Maintenance  '[]'  Relapse '[]'  Pre Contemplation  '[]'  Contemplation  '[]'  Preparation  '[]'  Action  '[]'  Maintenance  '[]'  Relapse   NUTRITION ASSESSMENT NUTRITION ASSESSMENT NUTRITION ASSESSMENT NUTRITION ASSESSMENT NUTRITION ASSESSMENT   Weight Management  Weight: 206.4        Height: 67   BMI: 32.4  Weight Management  Weight: **                  Weight Management  Weight: **                  Weight Management  Weight: ** Weight Management  Weight: **                    BMI: **   Eating Plan  Current eating habits: nothing in particular Eating Plan  Changes: Eating Plan  Changes: Eating Plan  Changes: Eating Plan Improvements:   Alcohol Use  '[]'  none          '[]'  daily  '[x]'  weekly      '[]'  special   Type: wine  Amount: 1       Diet Assessment Tool:  RATE YOUR PLATE  *Given to patient to complete and return. Diet Assessment Tool:    Score: **/69       Diet Assessment Tool: RATE YOUR PLATE  Score: **/22   NUTRITION PLAN NUTRITION PLAN  NUTRITION PLAN NUTRITION PLAN NUTRITION PLAN   *Interventions* *Interventions* *Interventions* *Interventions* *Interventions*   Initial Survey given Goal Setting Discussion:   '[]'  Yes      '[]'  No       Follow Up Survey Reviewed & Goals Updated:     Professional Referral  Please check if needed.  '[]'  Dietician Consult   '[]'  Wt. Management Referral  '[]'  Other:  Professional Referral  Please check if needed.  '[]'  Dietician Consult   '[]'  Wt. Management Referral  '[]'  Other: Professional Referral  Please check if needed.  '[]'  Dietician Consult   '[]'  Wt. Management Referral  '[]'  Other: Professional Referral  Please check if needed.  '[]'  Dietician Consult   '[]'  Wt. Management Referral  '[]'  Other: Professional Referral  Please check if needed.  '[]'  Dietician Consult   '[]'  Wt. Management Referral  '[]'  Other:   *Education* *Education* *Education* *Education* *Education*   Nutritional Education Recommended    '[x]'  1:1 Firefighter    Workshops  '[x]'  Label Reading   '[x]'  Menu  '[x]'  Targeting Nutrition Priorities  '[x]'  Fueling a Designer, multimedia   Nutritional Education Attended/Date Nutritional Education Attended/Date Nutritional Education Attended/Date All Sessions Completed?    '[]'  Yes  '[x]'  No    DECLINED   Cooking School    '[x]'  11 sessions recommended     *Adding Flavor  *Fast & Healthy     Breakfasts  *Salads & Dressings  *Soups & Simple     Sauces  *Simple Sides  *Appetizers &     Snacks  *Delicious Desserts  *Plant Proteins  *Fast Evening Meals  * Weekend Breakfasts  *Cook once, Eat       twice [employer] Completed   [employer]  Completed Press photographer    # of sessions completed:  0    DECLINED   *Goals* *Goals* *Goals* *Goals* *Goals*   Jesson's nutritional goals are as follows:  Complete and return diet survey Havoc's nutritional goals are as follows:  '[]'  Attend Nutrition Workshops  '[]'  Attend 1:1   '[]'  Attend Cooking Classes  '[]'  ** Jahron's nutritional goals are as  follows:  '[]'  Attend Nutrition Workshops  '[]'  Attend 1:1   '[]'  Attend Cooking Classes  '[]'  Complete and return diet survey  '[]'  ** Horst's nutritional goals are as follows:  '[]'  Attend Nutrition Workshops  '[]'  Attend 1:1   '[]'  Attend Cooking Classes  '[]'  ** Zacharee achieved nutritional goals   '[]'  Yes    '[]'  No  If no, why?  Use knowledge gained to continue Pritikin eating plan at home       Individual Cardiac Treatment Plan - Psychosocial  PSYCHOSOCIAL  ASSESSMENT/PLAN PSYCHOSOCIAL  REASSESSMENT PSYCHOSOCIAL   REASSESSMENT PSYCHOSOCIAL   REASSESSMENT PSYCHOSOCIAL  DISCHARGE/FOLLOW-UP   Stages of Change Stages of Change Stages of Change Stages of Change Stages of Change   '[]'  Pre Contemplation  '[x]'  Contemplation  '[]'  Preparation  '[]'  Action  '[]'  Maintenance  '[]'  Relapse '[]'  Pre Contemplation  '[]'  Contemplation  '[]'  Preparation  '[]'  Action  '[]'  Maintenance  '[]'  Relapse '[]'  Pre Contemplation  '[]'  Contemplation  '[]'  Preparation  '[]'  Action  '[]'  Maintenance  '[]'  Relapse '[]'  Pre Contemplation  '[]'  Contemplation  '[]'  Preparation  '[]'  Action  '[]'  Maintenance  '[]'  Relapse '[]'  Pre Contemplation  '[]'  Contemplation  '[]'  Preparation  '[]'  Action  '[]'  Maintenance  '[]'  Relapse   PSYCHOSOCIAL ASSESSMENT PSYCHOSOCIAL ASSESSMENT PSYCHOSOCIAL ASSESSMENT PSYCHOSOCIAL ASSESSMENT PSYCHOSOCIAL ASSESSMENT   Behavioral Outcomes Behavioral Outcomes Behavioral Outcomes Behavioral Outcomes Behavioral Outcomes   Tool Used:  Ferrans & Powers, Quality of Life Index, Cardiac Version IV  *Given to patient to complete. Tool Used:    Ferrans & Powers, Quality of Life Index, Cardiac Version IV     QOL Index Score: **  HF:**  S&E:**  P&S: **  Family: **   Tool Used:     Ferrans & Powers, Quality of Life Index, Cardiac Version IV    QOL Index Score: **  HF:**  S&E:**  P&S: **  Family: **   PHQ-9 score **  Depression Severity  '[]' Minimal  '[]' Mild   '[]' Moderate  '[]' Moderately Severe  '[]' Severe    PHQ-9 score **  Depression Severity  '[]' Minimal  '[]' Mild   '[]' Moderate  '[]' Moderately Severe '[]' Severe   Does  patient have Family Support?  '[]'  Yes      '[x]'  No  No signs of marital/family distress       Within the Past Month:  *Have you wished you were dead or wished you could go to sleep and not wake up?  '[]'  Yes      '[x]'  No  *Have you had any thoughts of killing yourself?  '[]'  Yes      '[x]'  No         Using a scale of 0-10, 0=none, 10=very:   Rate your depression: 9  Rate your anxiety:  8  Using a scale of 0-10, 0=none, 10=very:   Rate your depression: **  Rate your anxiety:  ** Using a scale of 0-10, 0=none, 10=very:   Rate your depression: **  Rate your anxiety:  ** Using a scale of 0-10, 0=none, 10=very:  Rate your depression: **  Rate your anxiety:  ** Using a scale of 0-10, 0=none, 10=very:   Rate your depression: **  Rate your anxiety:  **   Signs and Symptoms of Depression Present?    '[x]'  Yes      '[]'  No  If yes, please explain:  Pt upset because air conditioning not working. Signs and Symptoms of Depression Present?    '[]'  Yes      '[]'  No  If yes, please explain:  ** Signs and Symptoms of Depression Present?    '[]'  Yes      '[]'  No  If yes, please explain:  ** Signs and Symptoms of Depression Present?    '[]'  Yes      '[]'  No  If yes, please explain:  ** Signs and Symptoms of Depression Present?    '[]'  Yes      '[]'  No  If yes, please explain:  **   Signs and Symptoms of Anxiety Present?    '[x]'  Yes      '[]'  No  If yes, please explain:  See above Signs and Symptoms of Anxiety Present?    '[]'  Yes      '[]'  No  If yes, please explain:  ** Signs and Symptoms of Anxiety Present?    '[]'  Yes      '[]'  No  If yes, please explain:  ** Signs and Symptoms of Anxiety Present?    '[]'  Yes      '[]'  No  If yes, please explain:  ** Signs and Symptoms of Anxiety Present?    '[]'  Yes      '[]'  No  If yes, please explain:  **   '[]'  Patient has poor eye contact   '[]'  Flat affect present.  '[x]'  Signs of anxiety, anger or hostility    '[x]'  Signs social isolation present.   '[]'   Signs of alcohol or substance abuse       PSYCHOSOCIAL PLAN PSYCHOSOCIAL PLAN PSYCHOSOCIAL  PLAN PSYCHOSOCIAL PLAN PSYCHOSOCIAL PLAN   *Interventions* *Interventions* *Interventions* *Interventions* *Interventions*   *Please check if needed  '[]'  Psych Consult  '[x]'  Physician Referral- followed by physicin and seeing psychologist  '[x]'  Stress Management Skills *Please check if needed  '[]'  Psych Consult  '[]'  Physician Referral  '[]'  Stress Management Skills *Please check if needed  '[]'  Psych Consult  '[]'  Physician Referral  '[]'  Stress Management Skills *Please check if needed  '[]'  Psych Consult  '[]'  Physician Referral  '[]'  Stress Management Skills *Please check if needed  '[]'  Psych Consult  '[]'  Physician Referral  '[]'  Stress Management Skills   Is patient currently taking anti-depressant or anti-anxiety medications?  '[x]'  Yes      '[]'  No  If yes, please list medications:  Buspirone, paroxetine Change in anti-depressant or anti-anxiety medications?  '[]'  Yes      '[]'  No  If yes, please list medications:  ** Change in anti-depressant or anti-anxiety medications?  '[]'  Yes      '[]'  No  If yes, please list medications:  ** Change in anti-depressant or anti-anxiety medications?  '[]'  Yes      '[]'  No  If yes, please list medications:  ** Change in anti-depressant or anti-anxiety medications?  '[]'  Yes      '[]'  No  If yes, please list medications:  **   *Education* *Education* *Education* *Education* *Education*   Healthy Mind-Set Workshops Recommended  '[x]'  Stress & Health  '[x]'  Taking Charge of Stress  '[x]'  New Thoughts,  New Behaviors  '[x]'  Managing Moods & Relationships Healthy Mind-Set Workshops Attended/Date Healthy Mind-Set Workshops Attended/Date Healthy Mind-Set Workshops Attended/Date Healthy Mind-Set Workshops  Completed  '[]'  Yes      '[x]'  No    DECLINED   *Goals* *Goals* *Goals* *Goals* *Goals*   Sayeed's psychosocial goals are as follows:  Complete HADS & Ferrans & Powers, Quality of Life Index, Cardiac Version IV Joud's psychosocial goals are as follows:  '[]'  Attend Healthy Mind-Set Workshops  '[]'  Reduce depression symptom severity by 1  level  '[]'  ** Jamier's psychosocial goals are as follows:  '[]'  Attend Healthy Mind-Set Workshops  '[]'  Reduce depression symptom severity by 1 level  '[]'  ** Adriell's psychosocial goals are as follows:  '[]'  Attend Healthy Mind-Set Workshops  '[]'  Reduce depression symptom severity by 1 level  '[]'  Shanon Brow achieved psychosocial goals?  '[]'  Yes    '[]'  No  If no, why?  **  '[]'  Use methods learned to continue to reduce depression symptom severity by 1 level  '[]'  **     Individual Cardiac Treatment Plan - Other:  Risk Factor/Education  RISK FACTOR  ASSESSMENT/PLAN RISK FACTOR  REASSESSMENT  RISK FACTOR  REASSESSMENT RISK FACTOR  REASSESSMENT RISK FACTOR   DISCHARGE/FOLLOW-UP   Stages of Change Stages of Change Stages of Change Stages of Change Stages of Change   '[x]'  Pre Contemplation  '[]'  Contemplation  '[]'  Preparation  '[]'  Action  '[]'  Maintenance  '[]'  Relapse '[]'  Pre Contemplation  '[]'  Contemplation  '[]'  Preparation  '[]'  Action  '[]'  Maintenance  '[]'  Relapse '[]'  Pre Contemplation  '[]'  Contemplation  '[]'  Preparation  '[]'  Action  '[]'  Maintenance  '[]'  Relapse '[]'  Pre Contemplation  '[]'  Contemplation  '[]'  Preparation  '[]'  Action  '[]'  Maintenance  '[]'  Relapse '[]'  Pre Contemplation  '[]'  Contemplation  '[]'  Preparation  '[]'  Action  '[]'  Maintenance  '[]'  Relapse   RISK FACTOR/EDUCATION ASSESSMENT RISK FACTOR/EDUCATION ASSESSMENT RISK FACTOR/EDUCATION ASSESSMENT RISK FACTOR/EDUCATION ASSESSMENT RISK FACTOR /EDUCATION ASSESSMENT   Hypertension  '[x]'  Yes      '[]'  No    Resting BP: 108/68  Peak Ex BP:128/68  Medication: lisinopril, metoprolol   Hypertension  Resting BP: **  Peak Ex BP:**  Medication Changes:  '[]'  Yes      '[]'  No Hypertension  Resting BP: **  Peak Ex BP:**  Medication Changes:  '[]'  Yes      '[]'  No Hypertension  Resting BP: **  Peak Ex BP:**  Medication Changes:  '[]'  Yes      '[]'  No Hypertension  Resting BP: **  Peak Ex BP:**  Medication Changes:  '[]'  Yes      '[]'  No   Lipids  HLD/DLD  '[x]'  Yes      '[]'  No  TOTAL CHOL: 156  HDL:  31  LDL:  91  TRIG:  168  Medication:  atorvastatin, fenofibrate Lipids  Medication Changes:  '[]'  Yes      '[]'  No     Lipids  Medication Changes:  '[]'  Yes      '[]'  No     Lipids  Medication Changes:  '[]'  Yes      '[]'  No     Lipids    TOTAL CHOL: **  HDL:  **  LDL:  **  TRIG:  **  Medication Changes:  '[]'  Yes      '[]'  No   Diabetes  '[x]'  Yes      '[]'  No  FBS: 89  Monitor BS @ home:   '[x]'  Yes      '[]'  No  Frequency: rarely  Medication: glimepiride, metformin Diabetes  Most Recent BS:  BS have been in range  '[]'  Yes      '[]'  No  Medication Changes  '[]'  Yes      '[]'  No   Diabetes  Most Recent BS:  BS have been in range  '[]'  Yes      '[]'  No  Medication Changes  '[]'  Yes      '[]'  No     Diabetes  Most Recent BS:  BS have been in range  '[]'  Yes      '[]'  No  Medication Changes  '[]'  Yes      '[]'  No     Diabetes  Most Recent BS:  BS have been in range  '[]'  Yes      '[]'  No  Medication Changes  '[]'  Yes      '[]'  No       Tobacco Use  '[]'  Current  '[x]'  Former  '[]'  Never    Years smoked: 35:    Date Quit: 2008    # cigarettes smoked/day: 1pk  Smokeless Tobacco use:   '[]'  Yes      '[x]'  No   Tobacco Use  Change in smoking status   '[]'  Yes      '[]'  No    Quit date: **   Tobacco Use  Change in smoking status   '[]'  Yes      '[]'  No    Quit date: **   Tobacco Use  Change in smoking status   '[]'  Yes      '[]'  No    Quit date: ** Tobacco Use  Change in smoking status   '[]'  Yes      '[]'  No    Quit date: **            Learning Barriers  Please select one:  '[]'  Speech  '[]'  Literacy  '[]'  Hearing  '[]'  Cognitive  '[x]'  Vision  '[x]'  Ready to Learn Learning Barriers Addressed:   '[]'  Yes      '[]'  No   Learning Barriers Addressed:   '[]'  Yes      '[]'  No   Learning Barriers Addressed:  '[]'  Yes      '[]'  No Learning Barriers Addressed:  '[]'  Yes      '[]'  No     RISK FACTOR/EDUCATION PLAN RISK FACTOR/EDUCATION PLAN RISK FACTOR/EDUCATION PLAN RISK FACTOR/EDUCATION PLAN RISK FACTOR/EDUCATION PLAN   *Interventions* *Interventions* *Interventions* *Interventions* *Interventions*   Recommended Educational Videos    '[x]'  Intake & The  Pritikin Solution Program Overview  '[x]'  Overview of The Pritikin Eating Plan  '[x]'  Becoming A Pritikin Chef  '[x]'  Diseases of Our Time-Part 1  '[x]'  Calorie Density  '[x]'  Label Reading-Part 1  '[x]'  Move It!  '[x]'  Healthy Minds, Bodies, Hearts  '[x]'  Dining Out-Part 1  '[x]'  Heart Disease Risk Reduction  '[x]'  Metabolic Syndrome & Belly Fat  '[x]'  Facts on Fat  '[x]'  Diseases of Our Times-Part 2  '[x]'  Biology of Weight Control  '[x]'  Biomechanical Limitations  '[x]'  Nurtition Action Plan   Completed Videos      07/28/16  Overview of The Pritikin Eating Plan Completed Videos Completed Videos Recommended Educational Videos Completed    '[]'  Yes      '[x]'  No    **If not completed, Why? DECLINED          Smoking Cessation/Relaspe  Prevention Intervention needed?  '[]'  Yes      '[x]'  No        Smoking Cessation Counseling attended  '[]'  Yes      '[]'  No  **If not completed, Why? **   Professional Referrals:  *Please check if needed  '[]'  Diabetes Clinic  '[]'  Lipid Clinic   '[]'  Other:     Professional Referrals:  *Please check if needed  '[]'  Diabetes Clinic  '[]'  Lipid Clinic   '[]'  Other:   Preventative Medication Preventative Medication Preventative Medication Preventative Medication Preventative Medication   Aspirin  '[x]'  Yes    '[]'  No  Blood Thinner: Clopidogrel/Effient/Brillinta  '[x]'  Yes    '[]'  No  Beta Blocker  '[x]'  Yes    '[]'  No  Ace Inhibitor  '[x]'  Yes    '[]'  No  Statin/Lipid Lowering  '[x]'  Yes    '[]'  No Medication Changes?  '[]'  Yes    '[]'  No Medication Changes?  '[]'  Yes    '[]'  No Medication Changes?  '[]'  Yes    '[]'  No Medication Changes?  '[]'  Yes    '[]'  No   *Education* *Education* *Education* *Education* *Education*   Does Jassen require any additional education?  '[]'  Yes    '[x]'  No   Does Johntay require any additional education?  '[]'  Yes    '[]'  No Does Holten require any additional education?  '[]'  Yes    '[]'  No Does Ebb require any additional education?  '[]'  Yes    '[]'  No Does Clayborne require any additional education?  '[]'  Yes    '[]'  No   Recommended Additional Educational  Videos    Medical  '[]'  Hypertension & Heart Disease  '[]'  Decoding Lab Results  '[]'  Aging Enhancing Your QoL  '[]'  Sleep Disorders  Exercise  '[]'  Body Composition  '[]'  Improve Performance  '[]'  Exercise Action Plan  '[]'  Intro to Yoga  Behavioral  '[]'  How Our Thoughts Can Heal Our Hearts  '[]'  Smoking Cessation  Nutrition  '[]'  Planning Your Eating Strategy  '[]'  Lable Reading- Part 2  '[]'  Dining Out - Part 2  '[]'  Targeting Your Nutrition Priorities  '[]'  Fueling a Healthy Body  '[]'  Menu Workshop  [employer] '[]'  Breakfast & Snacks  '[]'  Healhty Salads & Dressing  '[]'  Dinner & Sides  '[]'  Soups & Hydrologist Completed Additional Educational Videos Completed    '[]'  Yes    '[x]'  No   *Goals* *Goals* *Goals* *Goals* *Goals*   Bodhi's risk factor/education goals are as follows:    '[x]'  Optimal BP <140/90  '[x]'  Blood Sugar <120  '[x]'  Attend recommended video education sessions  '[x]'  Takes medications as prescribed 100% of the time   '[]'  ** Dagon's risk factor/education goals are as follows:    '[x]'  Optimal BP <140/90  '[]'  Blood Sugar <120  '[x]'  Attend recommended video education sessions  '[x]'  Takes medications as prescribed 100% of the time   '[]'  ** Kayde's risk factor/education goals are as follows:    '[x]'  Optimal BP <140/90  '[]'  Blood Sugar <120  '[x]'  Attend recommended video education sessions  '[x]'  Takes medications as prescribed 100% of the time   '[]'  ** Arun's risk factor/education goals are as follows:    '[x]'  Optimal BP <140/90  '[]'  Blood Sugar <120  '[x]'  Attend recommended video education sessions  '[x]'  Takes medications as prescribed 100% of the time   '[]'  Shanon Brow  achieved risk factor goals?  '[]'  Yes    '[]'  No  If no, why?  **     Monitored telemetry has revealed: NSR  * Monitored telemetry has revealed **  '[]'  documented arrhythmia at increasing workloads  '[]'  associated symptoms ** Monitored telemetry has revealed  '[]'  documented arrhythmia  at increasing workloads  '[]'  associated symptoms ** Monitored telemetry has revealed **  '[]'  documented arrhythmia at increasing workloads  '[]'  associated symptoms ** Monitored telemetry has revealed **  '[]'  documented arrhythmia at increasing workloads  '[]'  associated symptoms **   Physician Response    '[x]'  Cardiac rehab is reasonably and medically necessary for continuous cardiac monitoring surveillance  of patient's cardiac activity  '[x]'  Initiate continuous telemerty monitoring and notify me with any concerns  '[]'  Other   Physician Response    '[x]'  Cardiac rehab is reasonably and medically necessary for continuous cardiac monitoring surveillance  of patient's cardiac activity  '[x]'  Continue continuous telemerty monitoring and notify me with any concerns  '[]'  Other     Physician Response      '[x]'  Cardiac rehab is reasonably and medically necessary for continuous cardiac monitoring surveillance  of patient's cardiac activity  '[x]'  Continue continuous telemerty monitoring and notify me with any concerns   '[]'  Other     Physician Response    '[x]'  Cardiac rehab is reasonably and medically necessary for continuous cardiac monitoring surveillance  of patient's cardiac activity  '[x]'  Continue continuous telemerty monitoring and notify me with any concerns   '[]'  Other

## 2016-08-11 ENCOUNTER — Inpatient Hospital Stay: Payer: MEDICARE | Primary: Family

## 2016-08-11 ENCOUNTER — Encounter

## 2016-08-11 MED ORDER — GLIMEPIRIDE 4 MG PO TABS
4 | ORAL_TABLET | Freq: Every morning | ORAL | 5 refills | Status: DC
Start: 2016-08-11 — End: 2016-12-27

## 2016-08-11 MED ORDER — OXYCODONE-ACETAMINOPHEN 10-325 MG PO TABS
10-325 | ORAL_TABLET | Freq: Three times a day (TID) | ORAL | 0 refills | Status: DC | PRN
Start: 2016-08-11 — End: 2016-09-14

## 2016-08-11 NOTE — Telephone Encounter (Signed)
Controlled Substances Monitoring:     RX Monitoring 08/11/2016   Attestation The Prescription Monitoring Report for this patient was reviewed today.   Documentation No signs of potential drug abuse or diversion identified.

## 2016-08-11 NOTE — Telephone Encounter (Signed)
08/11/2016   Micheal Birminghamavid A Isaacs called requesting a refill on the following medications:  Requested Prescriptions     Pending Prescriptions Disp Refills   . glimepiride (AMARYL) 4 MG tablet 30 tablet 5     Sig: Take 1 tablet by mouth every morning   . oxyCODONE-acetaminophen (PERCOCET) 10-325 MG per tablet 90 tablet 0     Sig: Take 1 tablet by mouth every 8 hours as needed for Pain for up to 30 days..     Pharmacy verified:  .pv      Date of last visit: 06/06/2016  Date of next visit (if applicable): 01/11/2017

## 2016-08-12 ENCOUNTER — Encounter: Payer: MEDICARE | Primary: Family

## 2016-08-15 ENCOUNTER — Encounter: Attending: Physician Assistant | Primary: Family

## 2016-08-15 ENCOUNTER — Encounter: Payer: MEDICARE | Primary: Family

## 2016-08-17 ENCOUNTER — Encounter: Payer: MEDICARE | Primary: Family

## 2016-08-19 ENCOUNTER — Encounter: Payer: MEDICARE | Primary: Family

## 2016-08-22 ENCOUNTER — Encounter: Payer: MEDICARE | Primary: Family

## 2016-08-23 MED ORDER — METOPROLOL SUCCINATE ER 25 MG PO TB24
25 MG | ORAL_TABLET | ORAL | 1 refills | Status: DC
Start: 2016-08-23 — End: 2017-02-13

## 2016-08-23 MED ORDER — ATORVASTATIN CALCIUM 80 MG PO TABS
80 MG | ORAL_TABLET | ORAL | 1 refills | Status: DC
Start: 2016-08-23 — End: 2017-02-13

## 2016-08-23 MED ORDER — ISOSORBIDE MONONITRATE ER 30 MG PO TB24
30 MG | ORAL_TABLET | ORAL | 1 refills | Status: DC
Start: 2016-08-23 — End: 2017-02-13

## 2016-08-23 MED ORDER — BRILINTA 90 MG PO TABS
90 MG | ORAL_TABLET | ORAL | 1 refills | Status: DC
Start: 2016-08-23 — End: 2017-02-13

## 2016-08-24 ENCOUNTER — Encounter: Payer: MEDICARE | Primary: Family

## 2016-08-26 ENCOUNTER — Encounter: Payer: MEDICARE | Primary: Family

## 2016-08-29 ENCOUNTER — Encounter: Payer: MEDICARE | Primary: Family

## 2016-08-31 ENCOUNTER — Encounter: Payer: MEDICARE | Primary: Family

## 2016-09-05 ENCOUNTER — Encounter

## 2016-09-05 MED ORDER — DOXYCYCLINE HYCLATE 100 MG PO TABS
100 | ORAL_TABLET | Freq: Two times a day (BID) | ORAL | 0 refills | Status: AC
Start: 2016-09-05 — End: 2016-09-15

## 2016-09-05 MED ORDER — MONTELUKAST SODIUM 10 MG PO TABS
10 | ORAL_TABLET | Freq: Every day | ORAL | 5 refills | Status: DC
Start: 2016-09-05 — End: 2017-01-06

## 2016-09-05 MED ORDER — PAROXETINE HCL 10 MG PO TABS
10 | ORAL_TABLET | Freq: Every day | ORAL | 5 refills | Status: DC
Start: 2016-09-05 — End: 2016-09-07

## 2016-09-05 MED ORDER — PREDNISONE 20 MG PO TABS
20 MG | ORAL_TABLET | Freq: Every day | ORAL | 0 refills | Status: AC
Start: 2016-09-05 — End: 2016-09-10

## 2016-09-05 NOTE — Progress Notes (Signed)
Micheal [name] is a 61 y.o. male that presents for Insomnia ( for last 3 days ) and Sinus Problem ( sinus headache  and  stuffy  nose  started  4 days ago)        HPI:    URI Symptoms    HPI:      Symptoms have been present for 4 day(s).  Symptoms are worse since they initially started.    Fever? No  Runny nose or congestion?  Yes - with post nasal draiange   Cough?  No  Sore throat?  Yes - dysphagia  Headache, fatigue, joint pains, muscle aches?  Yes - it is uncomfortable and causes difficulty with sleep  Shortness of breath/Wheezing?  No  Nausea/Vomiting/Diarrhea?  No  Double Sickening?  No  Sick contacts? No        Health Maintenance   Topic Date Due   . Hepatitis C screen  Nov 11, 1955   . Diabetic retinal exam  06/11/1964   . HIV screen  06/11/1969   . DTaP/Tdap/Td vaccine (1 - Tdap) 06/11/1973   . Shingles Vaccine (1 of 2 - 2 Dose Series) 06/11/2004   . Low dose CT lung screening  06/11/2009   . Diabetic foot exam  07/14/2016   . Flu vaccine (1) 09/24/2016   . Diabetic microalbuminuria test  04/14/2017   . Lipid screen  05/31/2017   . Potassium monitoring  07/04/2017   . Creatinine monitoring  07/04/2017   . A1C test (Diabetic or Prediabetic)  07/19/2017   . Colon cancer screen colonoscopy  07/25/2023   . Pneumococcal med risk  Completed       Past Medical History:   Diagnosis Date   . Arthritis     general   . CAD (coronary artery disease)     s/p stent placement   . Diabetes mellitus (HCC)    . GERD (gastroesophageal reflux disease)    . Hyperlipidemia    . Hypertension        Past Surgical History:   Procedure Laterality Date   . CORONARY ARTERY BYPASS GRAFT     . DIAGNOSTIC CARDIAC CATH LAB PROCEDURE     . ORTHOPEDIC SURGERY Left    . PTCA  07/04/2016       Social History   Substance Use Topics   . Smoking status: Former Smoker     Packs/day: 1.00     Years: 35.00     Types: Cigarettes     Quit date: 05/24/2008   . Smokeless tobacco: Never Used   . Alcohol use 0.6 oz/week     1 Glasses of wine per week       Comment: once monthly        Family History   Problem Relation Age of Onset   . Cancer Mother    . Heart Disease Brother    . Stroke Brother    . Diabetes Brother    . Early Death Brother    . High Blood Pressure Brother    . High Cholesterol Brother    . Mental Retardation Sister          I have reviewed the patient's past medical history, past surgical history, allergies, medications, social and family history and I have made updates where appropriate.    ROS        PHYSICAL EXAM:  BP 110/82   Pulse 69   Temp 98.3 F (36.8 C) (Oral)   Resp 14  Ht 5\' 7"  (1.702 m)   Wt 213 lb (96.6 kg)   BMI 33.36 kg/m     General Appearance: well developed and well- nourished, in no acute distress  Head: normocephalic and atraumatic  ENT: external ear and ear canal normal bilaterally, nose without deformity, nasal mucosa and turbinates congested without polyps, oropharynx normal, dentition is normal for age, no lip or gum lesions noted  Neck: supple and non-tender without mass, no thyromegaly or thyroid nodules, no cervical lymphadenopathy  Pulmonary/Chest: clear to auscultation bilaterally- no wheezes, rales or rhonchi, normal air movement, no respiratory distress or retractions  Cardiovascular: normal rate, regular rhythm, normal S1 and S2, no murmurs, rubs, clicks, or gallops, distal pulses intact  Abdomen: soft, non-tender, non-distended, no rebound or guarding, no masses or hernias noted, no hepatospleenomegaly  Extremities: no cyanosis, clubbing or edema of the lower extremities  Psych:  Normal affect without evidence of depression or anxiety, insight and judgement are appropriate, memory appears intact  Skin: warm and dry, no rash or erythema      ASSESSMENT & PLAN  Micheal [name] was seen today for insomnia and sinus problem.    Diagnoses and all orders for this visit:    Acute rhinosinusitis  -     doxycycline hyclate (VIBRA-TABS) 100 MG tablet; Take 1 tablet by mouth 2 times daily for 10 days  -     predniSONE (DELTASONE)  20 MG tablet; Take 1 tablet by mouth daily for 5 days    -Patient advised to call immediately or go to ER if any worsening of symptoms      Return if symptoms worsen or fail to improve.    Controlled Substances Monitoring:                     Micheal [name] received counseling on the following healthy behaviors: medication adherence  Reviewed prior labs and health maintenance.  Continue current medications, diet and exercise.  Discussed use, benefit, and side effects of prescribed medications. Barriers to medication compliance addressed.   Patient given educational materials - see patient instructions.    All patient questions answered.  Patient voiced understanding.

## 2016-09-05 NOTE — Telephone Encounter (Signed)
See attached

## 2016-09-05 NOTE — Progress Notes (Signed)
Have you changed or started any medications since your last visit including any over-the-counter medicines, vitamins, or herbal medicines? no   Are you having any side effects from any of your medications? -  no  Have you stopped taking any of your medications? Is so, why? -  no    Have you seen any other physician or provider since your last visit? No  Have you had any other diagnostic tests since your last visit? No  Have you been seen in the emergency room and/or had an admission to a hospital since we last saw you? No  Have you had your routine dental cleaning in the past 6 months? no    Have you activated your MyChart account? If not, what are your barriers? No:      Patient Care Team:  Mark D Kahle, DO as PCP - General (Family Medicine)  Mark D Kahle, DO as PCP - [facility] Attributed Provider    Medical History Review  Past Medical, Family, and Social History     Defer to provider.

## 2016-09-05 NOTE — Patient Instructions (Addendum)
You may receive a survey about your visit with us today. The feedback from our patients helps us identify what is working well and where the service to all patients can be enhanced. Thank you!

## 2016-09-07 ENCOUNTER — Encounter

## 2016-09-07 NOTE — Telephone Encounter (Signed)
Received request for the Rx for Paroxetine to be dispensed for a 90 day supply. Med pended so.

## 2016-09-08 MED ORDER — PAROXETINE HCL 10 MG PO TABS
10 | ORAL_TABLET | Freq: Every day | ORAL | 1 refills | Status: DC
Start: 2016-09-08 — End: 2016-11-10

## 2016-09-14 ENCOUNTER — Encounter

## 2016-09-14 MED ORDER — OXYCODONE-ACETAMINOPHEN 10-325 MG PO TABS
10-325 | ORAL_TABLET | Freq: Three times a day (TID) | ORAL | 0 refills | Status: DC | PRN
Start: 2016-09-14 — End: 2016-10-13

## 2016-09-14 NOTE — Telephone Encounter (Signed)
09/14/16   [name] called requesting a refill on the following medications:  Requested Prescriptions     Pending Prescriptions Disp Refills   ??? oxyCODONE-acetaminophen (PERCOCET) 10-325 MG per tablet 90 tablet 0     Sig: Take 1 tablet by mouth every 8 hours as needed for Pain for up to 30 days..     Pharmacy verified: [facility]  .pv      Date of last visit:   09/05/16  Date of next visit (if applicable): 01/11/17  blm

## 2016-09-14 NOTE — Telephone Encounter (Signed)
Controlled Substances Monitoring:     RX Monitoring 09/14/2016   Attestation The Prescription Monitoring Report for this patient was reviewed today.   Documentation No signs of potential drug abuse or diversion identified.

## 2016-09-16 ENCOUNTER — Encounter: Payer: MEDICARE | Primary: Family

## 2016-09-19 ENCOUNTER — Encounter: Payer: MEDICARE | Primary: Family

## 2016-09-21 ENCOUNTER — Encounter: Payer: MEDICARE | Primary: Family

## 2016-09-22 ENCOUNTER — Ambulatory Visit: Admit: 2016-09-22 | Discharge: 2016-09-22 | Payer: MEDICARE | Attending: Family Medicine | Primary: Family

## 2016-09-22 DIAGNOSIS — J019 Acute sinusitis, unspecified: Secondary | ICD-10-CM

## 2016-09-22 MED ORDER — LORAZEPAM 0.5 MG PO TABS
0.5 | ORAL_TABLET | Freq: Three times a day (TID) | ORAL | 0 refills | Status: AC | PRN
Start: 2016-09-22 — End: 2016-10-22

## 2016-09-22 MED ORDER — LEVOFLOXACIN 500 MG PO TABS
500 MG | ORAL_TABLET | Freq: Every day | ORAL | 0 refills | Status: AC
Start: 2016-09-22 — End: 2016-10-02

## 2016-09-22 MED ORDER — PREDNISONE 10 MG PO TABS
10 MG | ORAL_TABLET | ORAL | 0 refills | Status: DC
Start: 2016-09-22 — End: 2016-11-10

## 2016-09-22 NOTE — Progress Notes (Signed)
SUBJECTIVE:  Micheal [name] is a 61 y.o. y/o male that presents with Sinus Problem (over last  2 months ) and Anxiety (worse  while sinus are acting up )  .    HPI:      Symptoms have been present for 2 month(s).  I last saw him 2 weeks, I gave him doxycycline, he states that the symptoms improved, but then returned.  Symptoms are worse since they initially started.    Fever? No  Runny nose or congestion?  Yes - with post nasal drainage   Cough?  Yes - 'not a lot'  Sore throat?  Yes - feels like he has a lot of mucus in the back of the throat  Headache, fatigue, joint pains, muscle aches?  Yes - left sided maxillary pain and pressure  Shortness of breath/Wheezing?  Yes  Nausea/Vomiting/Diarrhea?  No  Double Sickening?  Yes  Sick contacts? No    Notes that the infection is causing his anxiety to be very bad.  He is requesting an rx for ativan as he has been having more panic attacks recently.    Past Medical History:   Diagnosis Date   . Arthritis     general   . CAD (coronary artery disease)     s/p stent placement   . Diabetes mellitus (HCC)    . GERD (gastroesophageal reflux disease)    . Hyperlipidemia    . Hypertension        Social History     Social History   . Marital status: Married     Spouse name: N/A   . Number of children: N/A   . Years of education: N/A     Occupational History   . Not on file.     Social History Main Topics   . Smoking status: Former Smoker     Packs/day: 1.00     Years: 35.00     Types: Cigarettes     Quit date: 05/24/2008   . Smokeless tobacco: Never Used   . Alcohol use 0.6 oz/week     1 Glasses of wine per week      Comment: once monthly    . Drug use: No   . Sexual activity: Yes     Partners: Female     Other Topics Concern   . Not on file     Social History Narrative   . No narrative on file       Family History   Problem Relation Age of Onset   . Cancer Mother    . Heart Disease Brother    . Stroke Brother    . Diabetes Brother    . Early Death Brother    . High Blood Pressure  Brother    . High Cholesterol Brother    . Mental Retardation Sister            OBJECTIVE:  BP 112/70   Pulse 80   Temp 98.3 F (36.8 C) (Oral)   Resp 18   Ht 5' 6.5" (1.689 m)   Wt 212 lb (96.2 kg)   BMI 33.71 kg/m   General appearance: alert, well appearing, and in no distress.  ENT exam reveals - neck without nodes, pharynx erythematous without exudate and nasal mucosa congested.   CVS exam: normal rate, regular rhythm, normal S1, S2, no murmurs, rubs, clicks or gallops.  Chest:clear to auscultation, no wheezes, rales or rhonchi, symmetric air entry.   Abdominal exam: soft,  nontender, nondistended, no masses or organomegaly.  Extremities:  No clubbing, cyanosis or edema  Skin exam - normal coloration and turgor, no rashes, no suspicious skin lesions noted.  Psych -  Affect appropriate.  Thought process is normal without evidence of depression or psychosis.    Good insight and appropriae interaction.  Cognition and memory appear to be intact.        ASSESSMENT & PLAN  Micheal [name] was seen today for sinus problem and anxiety.    Diagnoses and all orders for this visit:    Acute rhinosinusitis  -     predniSONE (DELTASONE) 10 MG tablet; Take 4 tabs PO x 3 days, then take 3 tabs PO x 3 days, then take 2 tabs PO x 3 days, then take 1 tab PO x 3 days  -     levofloxacin (LEVAQUIN) 500 MG tablet; Take 1 tablet by mouth daily for 10 days    GAD (generalized anxiety disorder)  -     LORazepam (ATIVAN) 0.5 MG tablet; Take 1 tablet by mouth every 8 hours as needed for Anxiety for up to 30 days..        Return if symptoms worsen or fail to improve.     -If sx persist after this tx, will obtain Ct sinuses  -Patient advised to call immediately or go to ER if any worsening of symptoms  -Patient counseled on conservative care including fluids, rest and OTC meds    Micheal Huaavid received counseling on the following healthy behaviors: medication adherence  Reviewed prior labs and health maintenance.  Continue current medications, diet  and exercise.  Discussed use, benefit, and side effects of prescribed medications. Barriers to medication compliance addressed.   Patient given educational materials - see patient instructions.    All patient questions answered.  Patient voiced understanding.       I have reviewed this patient's history, habits, and medication list and have updated the chart where appropriate.

## 2016-09-22 NOTE — Patient Instructions (Addendum)
You may receive a survey about your visit with us today. The feedback from our patients helps us identify what is working well and where the service to all patients can be enhanced. Thank you!

## 2016-09-23 ENCOUNTER — Encounter: Payer: MEDICARE | Primary: Family

## 2016-09-26 ENCOUNTER — Encounter: Payer: MEDICARE | Primary: Family

## 2016-09-27 ENCOUNTER — Encounter

## 2016-09-27 MED ORDER — FENOFIBRATE 145 MG PO TABS
145 | ORAL_TABLET | ORAL | 5 refills | Status: DC
Start: 2016-09-27 — End: 2017-04-10

## 2016-09-27 MED ORDER — ESOMEPRAZOLE MAGNESIUM 40 MG PO CPDR
40 | ORAL_CAPSULE | ORAL | 5 refills | Status: DC
Start: 2016-09-27 — End: 2017-04-10

## 2016-09-28 ENCOUNTER — Encounter: Payer: MEDICARE | Primary: Family

## 2016-09-30 ENCOUNTER — Encounter: Payer: MEDICARE | Primary: Family

## 2016-10-03 ENCOUNTER — Encounter: Payer: MEDICARE | Primary: Family

## 2016-10-05 ENCOUNTER — Encounter: Payer: MEDICARE | Primary: Family

## 2016-10-07 ENCOUNTER — Encounter: Payer: MEDICARE | Primary: Family

## 2016-10-10 ENCOUNTER — Encounter: Payer: MEDICARE | Primary: Family

## 2016-10-12 ENCOUNTER — Encounter: Payer: MEDICARE | Primary: Family

## 2016-10-13 ENCOUNTER — Encounter

## 2016-10-13 MED ORDER — OXYCODONE-ACETAMINOPHEN 10-325 MG PO TABS
10-325 | ORAL_TABLET | Freq: Three times a day (TID) | ORAL | 0 refills | Status: DC | PRN
Start: 2016-10-13 — End: 2016-11-10

## 2016-10-13 NOTE — Telephone Encounter (Signed)
Vivia Birminghamavid A Keiper called requesting a refill on the following medications:  Requested Prescriptions     Pending Prescriptions Disp Refills   . oxyCODONE-acetaminophen (PERCOCET) 10-325 MG per tablet 90 tablet 0     Sig: Take 1 tablet by mouth every 8 hours as needed for Pain for up to 30 days..     Pharmacy verified:  .pv    [facility] [address]    Date of last visit: 09/22/16  Date of next visit (if applicable): 01/11/2017

## 2016-10-13 NOTE — Telephone Encounter (Signed)
Controlled Substances Monitoring:     RX Monitoring 10/13/2016   Attestation The Prescription Monitoring Report for this patient was reviewed today.   Documentation No signs of potential drug abuse or diversion identified.

## 2016-10-14 ENCOUNTER — Encounter: Payer: MEDICARE | Primary: Family

## 2016-10-17 ENCOUNTER — Encounter: Payer: MEDICARE | Primary: Family

## 2016-10-19 ENCOUNTER — Encounter: Payer: MEDICARE | Primary: Family

## 2016-10-24 ENCOUNTER — Encounter: Payer: MEDICARE | Primary: Family

## 2016-11-10 ENCOUNTER — Ambulatory Visit: Admit: 2016-11-10 | Discharge: 2016-11-10 | Payer: MEDICARE | Attending: Family Medicine | Primary: Family

## 2016-11-10 ENCOUNTER — Encounter

## 2016-11-10 DIAGNOSIS — F411 Generalized anxiety disorder: Secondary | ICD-10-CM

## 2016-11-10 MED ORDER — DIAZEPAM 2 MG PO TABS
2 MG | ORAL_TABLET | Freq: Three times a day (TID) | ORAL | 0 refills | Status: DC | PRN
Start: 2016-11-10 — End: 2016-12-07

## 2016-11-10 MED ORDER — OXYCODONE-ACETAMINOPHEN 10-325 MG PO TABS
10-325 | ORAL_TABLET | Freq: Three times a day (TID) | ORAL | 0 refills | Status: DC | PRN
Start: 2016-11-10 — End: 2016-12-07

## 2016-11-10 MED ORDER — PAROXETINE HCL 20 MG PO TABS
20 | ORAL_TABLET | Freq: Every day | ORAL | 2 refills | Status: DC
Start: 2016-11-10 — End: 2017-01-05

## 2016-11-10 NOTE — Progress Notes (Signed)
Patient instructed to report any adverse reaction to me immediately.  Most recent Vaccine Information Sheet dated 08/30/13   given to pt.   Immunizations     Name Date Dose Route    Influenza, Pilar JarvisQuadv, 3 yrs and older, IM, PF (Fluzone 3 yrs and older or Afluria 5 yrs and older) 11/10/2016 0.5 mL Intramuscular    Site: Deltoid- Right    Lot: [id number]    NDC: [id number]

## 2016-11-10 NOTE — Telephone Encounter (Signed)
Controlled Substances Monitoring:     RX Monitoring 11/10/2016   Attestation The Prescription Monitoring Report for this patient was reviewed today.   Documentation No signs of potential drug abuse or diversion identified.

## 2016-11-10 NOTE — Patient Instructions (Signed)
You may receive a survey about your visit with us today. The feedback from our patients helps us identify what is working well and where the service to all patients can be enhanced. Thank you!

## 2016-11-10 NOTE — Progress Notes (Signed)
Micheal [name] is a 61 y.o. male that presents for     Chief Complaint   Patient presents with   . Anxiety     anxeity worsening since last visit.  States Buspar is not helping his anxiety.  States in the past Lorazepam helped, but didn't last a full 8 hrs    . Flu Vaccine           HPI:    Anxiety     HPI:  Continues to have persistent anxiety.  We have tried multiple medications without long term improvement in sx.  Buspar has not helped.  Patient states that he is having significant worsening of his symptoms recently.  Notes that she is not sleeping.  He paces frequently.    Inciting events or triggers for anxiety - can be anything   Frequency of anxiety - daily  Panic attacks?  Yes  Sleep Disturbances?  Yes - up most of the night  Impaired concentration? Yes  Substance abuse? No  Suicidal/Homicidal Ideation? No    Compliant with meds: yes  Med side effects: No    Sees therapist?: Yes  Family History of Mental Illness? No      Health Maintenance   Topic Date Due   . Hepatitis C screen  Jun 04, 1955   . Diabetic retinal exam  06/11/1964   . HIV screen  06/11/1969   . DTaP/Tdap/Td vaccine (1 - Tdap) 06/11/1973   . Shingles Vaccine (1 of 2 - 2 Dose Series) 06/11/2004   . Diabetic foot exam  07/14/2016   . Diabetic microalbuminuria test  04/14/2017   . Lipid screen  05/31/2017   . Potassium monitoring  07/04/2017   . Creatinine monitoring  07/04/2017   . A1C test (Diabetic or Prediabetic)  07/19/2017   . Colon cancer screen colonoscopy  07/25/2023   . Flu vaccine  Completed   . Pneumococcal med risk  Completed       Past Medical History:   Diagnosis Date   . Arthritis     general   . CAD (coronary artery disease)     s/p stent placement   . Diabetes mellitus (HCC)    . GERD (gastroesophageal reflux disease)    . Hyperlipidemia    . Hypertension        Past Surgical History:   Procedure Laterality Date   . CORONARY ARTERY BYPASS GRAFT     . DIAGNOSTIC CARDIAC CATH LAB PROCEDURE     . ORTHOPEDIC SURGERY Left    . PTCA   07/04/2016       Social History   Substance Use Topics   . Smoking status: Former Smoker     Packs/day: 1.00     Years: 35.00     Types: Cigarettes     Quit date: 05/24/2008   . Smokeless tobacco: Never Used   . Alcohol use 0.6 oz/week     1 Glasses of wine per week      Comment: once monthly        Family History   Problem Relation Age of Onset   . Cancer Mother    . Heart Disease Brother    . Stroke Brother    . Diabetes Brother    . Early Death Brother    . High Blood Pressure Brother    . High Cholesterol Brother    . Mental Retardation Sister          I have reviewed the patient's past  medical history, past surgical history, allergies, medications, social and family history and I have made updates where appropriate.    Review of Systems        PHYSICAL EXAM:  BP 122/70   Pulse 80   Temp 97.8 F (36.6 C) (Oral)   Resp 16   Ht 5\' 7"  (1.702 m)   Wt 220 lb (99.8 kg)   BMI 34.46 kg/m     General Appearance: well developed and well- nourished, in no acute distress  Head: normocephalic and atraumatic  ENT: external ear and ear canal normal bilaterally, nose without deformity, nasal mucosa and turbinates normal without polyps, oropharynx normal, dentition is normal for age, no lipor gum lesions noted  Neck: supple and non-tender without mass, no thyromegaly or thyroid nodules, no cervical lymphadenopathy  Pulmonary/Chest: clear to auscultation bilaterally- no wheezes, rales or rhonchi, normal air movement, no respiratory distress or retractions  Cardiovascular: normal rate, regular rhythm, normal S1 and S2, no murmurs, rubs, clicks, or gallops, distal pulses intact  Abdomen: soft, non-tender, non-distended, no rebound or guarding, no masses or hernias noted, no hepatospleenomegaly  Extremities: no cyanosis, clubbing or edema of the lower extremities  Psych:  Normal affect without evidence of depression oranxiety, insight and judgement are appropriate, memory appears intact  Skin: warm and dry, no rash or  erythema      ASSESSMENT & PLAN  Micheal HuaDavid was seen today for anxiety and flu vaccine.    Diagnoses and all orders for this visit:    GAD (generalized anxiety disorder)  -     PARoxetine (PAXIL) 20 MG tablet; Take 1 tablet by mouth daily  -     diazepam (VALIUM) 2 MG tablet; Take 1 tablet by mouth every 8 hours as needed for Anxiety for up to 30 days..    Need for influenza vaccination  -     INFLUENZA, QUADV, 3 YRS AND OLDER, IM, PF, PREFILL SYR OR SDV, 0.5ML (FLUZONE QUADV, PF)    -We have tried multiple things to improve his anxiety, however, we have not been successful thus far  -Will increase paxil and add low dose PRN valium  -Recheck in 2 months as scheduled    Return if symptoms worsen or fail to improve.    Controlled Substances Monitoring:     The Prescription Monitoring Report for this patient was reviewed today. [name]([name], DO)    No signs of potential drug abuse or diversion identified. [name]([name], DO)          [name] received counseling on the following healthy behaviors: medication adherence  Reviewed prior labs and health maintenance.  Continue current medications, diet and exercise.  Discussed use, benefit, and side effects of prescribed medications. Barriers to medication compliance addressed.   Patient given educational materials - see patient instructions.    All patient questions answered.  Patient voiced understanding.

## 2016-11-10 NOTE — Telephone Encounter (Signed)
Vivia Birminghamavid A Fortenberry called requesting a refill on the following medications:  Requested Prescriptions     Pending Prescriptions Disp Refills   . oxyCODONE-acetaminophen (PERCOCET) 10-325 MG per tablet 90 tablet 0     Sig: Take 1 tablet by mouth every 8 hours as needed for Pain for up to 30 days..     Pharmacy verified:  .pv      Date of last visit: 09/22/16  Date of next visit (if applicable): 01/11/2017

## 2016-11-10 NOTE — Telephone Encounter (Signed)
DONV is today. He made an appointment for another issue.

## 2016-12-07 ENCOUNTER — Encounter

## 2016-12-07 MED ORDER — OXYCODONE-ACETAMINOPHEN 10-325 MG PO TABS
10-325 | ORAL_TABLET | Freq: Three times a day (TID) | ORAL | 0 refills | Status: DC | PRN
Start: 2016-12-07 — End: 2017-01-05

## 2016-12-07 MED ORDER — DIAZEPAM 2 MG PO TABS
2 | ORAL_TABLET | Freq: Three times a day (TID) | ORAL | 0 refills | Status: DC | PRN
Start: 2016-12-07 — End: 2017-01-05

## 2016-12-07 NOTE — Telephone Encounter (Signed)
Controlled Substances Monitoring:     RX Monitoring 12/07/2016   Attestation The Prescription Monitoring Report for this patient was reviewed today.   Documentation No signs of potential drug abuse or diversion identified.

## 2016-12-07 NOTE — Telephone Encounter (Signed)
12/07/16   Micheal [name] called requesting a refill on the following medications:  Requested Prescriptions     Pending Prescriptions Disp Refills   . oxyCODONE-acetaminophen (PERCOCET) 10-325 MG per tablet 90 tablet 0     Sig: Take 1 tablet by mouth every 8 hours as needed for Pain for up to 30 days..   . diazepam (VALIUM) 2 MG tablet 90 tablet 0     Sig: Take 1 tablet by mouth every 8 hours as needed for Anxiety for up to 30 days..     [facility]arm[facility] [address] [place]  .[facility]      Date of last visit:  11/10/16  Date of next visit (if applicable): 01/11/17  blm

## 2016-12-26 ENCOUNTER — Ambulatory Visit: Admit: 2016-12-26 | Discharge: 2016-12-26 | Payer: MEDICARE | Attending: Cardiovascular Disease | Primary: Family

## 2016-12-26 DIAGNOSIS — I25810 Atherosclerosis of coronary artery bypass graft(s) without angina pectoris: Secondary | ICD-10-CM

## 2016-12-26 NOTE — Progress Notes (Signed)
[facility]  [facility]  [address]  [address]  [place] [id number]  Dept: [phone]  Dept Fax: [phone]  Loc: [phone]    Visit Date: 12/26/2016    Micheal [name] is a 61 y.o. male who presents todayfor:  Chief Complaint   Patient presents with   . Check-Up   . Hypertension   . Coronary Artery Disease   . Hyperlipidemia   back surgery cancelled  Had stents  Known CABG   Does have back issues  No chest pain   No changes in breathing  BP is stable   Does have some baseline dyspnea       HPI:  HPI  Past Medical History:   Diagnosis Date   . Arthritis     general   . CAD (coronary artery disease)     s/p stent placement   . Diabetes mellitus (HCC)    . GERD (gastroesophageal reflux disease)    . Hyperlipidemia    . Hypertension       Past Surgical History:   Procedure Laterality Date   . CORONARY ARTERY BYPASS GRAFT     . DIAGNOSTIC CARDIAC CATH LAB PROCEDURE     . ORTHOPEDIC SURGERY Left    . PTCA  07/04/2016     Family History   Problem Relation Age of Onset   . Cancer Mother    . Heart Disease Brother    . Stroke Brother    . Diabetes Brother    . Early Death Brother    . High Blood Pressure Brother    . High Cholesterol Brother    . Mental Retardation Sister      Social History   Substance Use Topics   . Smoking status: Former Smoker     Packs/day: 1.00     Years: 35.00     Types: Cigarettes     Quit date: 05/24/2008   . Smokeless tobacco: Never Used   . Alcohol use 0.6 oz/week     1 Glasses of wine per week      Comment: once monthly       Current Outpatient Prescriptions   Medication Sig Dispense Refill   . oxyCODONE-acetaminophen (PERCOCET) 10-325 MG per tablet Take 1 tablet by mouth every 8 hours as needed for Pain for up to 30 days.. 90 tablet 0   . diazepam (VALIUM) 2 MG tablet Take 1 tablet by mouth every 8 hours as needed for Anxiety for up to 30 days.. 90 tablet 0   . PARoxetine (PAXIL) 20 MG tablet Take 1 tablet by mouth daily 30 tablet 2   .  fenofibrate (TRICOR) 145 MG tablet TAKE 1 TABLET BY MOUTH DAILY 30 tablet 5   . esomeprazole (NEXIUM) 40 MG delayed release capsule TAKE 1 CAPSULE BY MOUTH EVERY MORNING BEFORE BREAKFAST 30 capsule 5   . montelukast (SINGULAIR) 10 MG tablet Take 1 tablet by mouth daily 30 tablet 5   . isosorbide mononitrate (IMDUR) 30 MG extended release tablet TAKE 1 TABLET BY MOUTH ONCE DAILY 90 tablet 1   . metoprolol succinate (TOPROL XL) 25 MG extended release tablet TAKE 1 TABLET BY MOUTH ONCE DAILY 90 tablet 1   . BRILINTA 90 MG TABS tablet TAKE 1 TABLET BY MOUTH TWICE DAILY 180 tablet 1   . atorvastatin (LIPITOR) 80 MG tablet TAKE 1 TABLET BY MOUTH BY MOUTH NIGHTLY 90 tablet 1   . glimepiride (  AMARYL) 4 MG tablet Take 1 tablet by mouth every morning 30 tablet 5   . ipratropium (ATROVENT) 0.03 % nasal spray 2 sprays by Nasal route 2 times daily 1 Bottle 5   . lisinopril (PRINIVIL;ZESTRIL) 40 MG tablet TAKE 1 TABLET BY MOUTH DAILY 90 tablet 3   . nitroGLYCERIN (NITROSTAT) 0.4 MG SL tablet Place 1 tablet under the tongue every 5 minutes as needed for Chest pain 25 tablet 2   . aspirin 81 MG chewable tablet Take 1 tablet by mouth daily 30 tablet 0   . metFORMIN (GLUCOPHAGE) 1000 MG tablet Take 1 tablet by mouth 2 times daily (with meals) 180 tablet 3   . hydrocortisone 2.5 % cream Apply to external ear BID PRN 1 Tube 2   . olopatadine (PATANOL) 0.1 % ophthalmic solution Place 1 drop into both eyes 2 times daily 1 Bottle 5   . Handicap Placard MISC by Does not apply route Expires 03/01/2020 1 each 0   . Multiple Vitamins-Minerals (CENTRUM SILVER ULTRA MENS) TABS Take 1 tablet by mouth daily       No current facility-administered medications for this visit.      No Known Allergies  Health Maintenance   Topic Date Due   . Hepatitis C screen  1955/09/14   . Diabetic retinal exam  06/11/1964   . HIV screen  06/11/1969   . DTaP/Tdap/Td vaccine (1 - Tdap) 06/11/1973   . Shingles Vaccine (1 of 2 - 2 Dose Series) 06/11/2004   . Low dose CT  lung screening  06/11/2009   . Diabetic foot exam  07/14/2016   . Diabetic microalbuminuria test  04/14/2017   . Lipid screen  05/31/2017   . Potassium monitoring  07/04/2017   . Creatinine monitoring  07/04/2017   . A1C test (Diabetic or Prediabetic)  07/19/2017   . Colon cancer screen colonoscopy  07/25/2023   . Flu vaccine  Completed   . Pneumococcal med risk  Completed       Subjective:  Review of Systems  General:   No fever, no chills, some fatigue or weight loss  Pulmonary:    some dyspnea, no wheezing  Cardiac:    Denies recent chest pain,   GI:     No nausea or vomiting, no abdominal pain  Neuro:    No dizziness or light headedness,   Musculoskeletal:  No recent active issues  Extremities:   No edema, good peripheral pulses      Objective:  Physical Exam  BP 138/88   Pulse 96   Ht 5\' 7"  (1.702 m)   Wt 218 lb 9.6 oz (99.2 kg)   BMI 34.24 kg/m   General:   Well developed, well nourished  Lungs:   Clear to auscultation  Heart:    Normal S1 S2, Slight murmur. no rubs, no gallops  Abdomen:   Soft, non tender, no organomegalies, positive bowel sounds  Extremities:   No edema, no cyanosis, good peripheral pulses  Neurological:   Awake, alert, oriented. No obvious focal deficits  Musculoskelatal:  No obvious deformities    Assessment:      Diagnosis Orders   1. Coronary artery disease involving coronary bypass graft of native heart without angina pectoris     2. Essential hypertension     3. Familial hypercholesterolemia     cardiac fair for now   Back issues    Plan:  No Follow-up on file.  As above  Continue risk factor modification  and medical management  Thank you for allowing me to participate in the care of your patient. Please don't hesitate to contact me regarding any further issues related to the patient care    Orders Placed:  No orders of the defined types were placed in this encounter.      Medications Prescribed:  No orders of the defined types were placed in this encounter.         Discussed use,  benefit, and side effects of prescribed medications. All patient questions answered. Pt voicedunderstanding. Instructed to continue current medications, diet and exercise. Continue risk factor modification and medical management. Patient agreed with treatment plan. Follow up as directed.    Electronically signedby [name] Balboa, MD on 12/26/2016 at 10:57 AM

## 2016-12-26 NOTE — Progress Notes (Signed)
Pt here for 6 month fu     Denies chest pain   Gets some SOB on exertion   No med list today

## 2016-12-27 ENCOUNTER — Encounter

## 2016-12-27 MED ORDER — GLIMEPIRIDE 4 MG PO TABS
4 MG | ORAL_TABLET | ORAL | 1 refills | Status: DC
Start: 2016-12-27 — End: 2017-07-18

## 2017-01-05 ENCOUNTER — Ambulatory Visit: Admit: 2017-01-05 | Discharge: 2017-01-05 | Payer: MEDICARE | Attending: Family Medicine | Primary: Family

## 2017-01-05 DIAGNOSIS — E119 Type 2 diabetes mellitus without complications: Secondary | ICD-10-CM

## 2017-01-05 LAB — POCT GLYCOSYLATED HEMOGLOBIN (HGB A1C): Hemoglobin A1C: 6.9 %

## 2017-01-05 MED ORDER — DIAZEPAM 5 MG PO TABS
5 | ORAL_TABLET | Freq: Three times a day (TID) | ORAL | 0 refills | Status: DC | PRN
Start: 2017-01-05 — End: 2017-02-02

## 2017-01-05 MED ORDER — OXYCODONE-ACETAMINOPHEN 10-325 MG PO TABS
10-325 | ORAL_TABLET | Freq: Three times a day (TID) | ORAL | 0 refills | Status: DC | PRN
Start: 2017-01-05 — End: 2017-02-02

## 2017-01-05 MED ORDER — PAROXETINE HCL 20 MG PO TABS
20 | ORAL_TABLET | Freq: Every day | ORAL | 3 refills | Status: DC
Start: 2017-01-05 — End: 2017-10-24

## 2017-01-05 MED ORDER — NALOXONE HCL 4 MG/0.1ML NA LIQD
4 | NASAL | 0 refills | Status: DC | PRN
Start: 2017-01-05 — End: 2017-10-24

## 2017-01-05 NOTE — Progress Notes (Signed)
Micheal [name] is a 61 y.o. male that presents for     Chief Complaint   Patient presents with   . Follow-up     DM    . Discuss Medications     anxiety - valium  is not lasting long enough    . Other      discuss  90 day  rx  on  regular meds        BP 138/76   Pulse 64   Temp 98.1 F (36.7 C) (Oral)   Resp 16   Ht 5\' 9"  (1.753 m)   Wt 221 lb (100.2 kg)   BMI 32.64 kg/m       HPI:    GAD:  Started on Valium at last visit.  States that this helps quite a bit, but only seems to last about 2 hours and then he starts getting very anxious and restless.  Notes that he has baseline SOB and palpitations.  Sleep is still fractured, but improved.    Chronic pain has been reasonably controlled with percocet.  His back surgery had to be delayed due to having DES placed.      Diabetes Type 2    Glucose control:   Does patient check blood glucoses at home?  No  Report of hypoglycemia: no  Lab Results   Component Value Date    LABA1C 6.9 01/05/2017     No results found for: EAG    Symptoms  Polyuria, Polydipsia or Polyphagia?   No  Chest Pain, SOB, or Palpitations? -  No  New Vision complaints? No  Paresthesias of the extremities?  No    Medications  Current medication were reviewed.  Compliant with medications?  yes  Medication side effects?  No  On ACE-I or ARB?  Yes  On antiplatelet therapy?  Yes  On Statin?  Yes      Exercise  Exercise? No  Wt Readings from Last 3 Encounters:   01/05/17 221 lb (100.2 kg)   12/26/16 218 lb 9.6 oz (99.2 kg)   11/10/16 220 lb (99.8 kg)       Diet discipline?:  Low salt, fat, sugar diet?  no    Blood pressure control:  BP Readings from Last 3 Encounters:   01/05/17 138/76   12/26/16 138/88   11/10/16 122/70       Lab Results   Component Value Date    LABMICR 1.28 09/03/2014       Lab Results   Component Value Date    LDLCALC 91 05/31/2016     HTN    Does patient check BP regularly at home? - No  Current Medication regimen - Lisinopril, Metoprolol,   Tolerating medications well? -  yes    Shortness of breath or chest pain? No  Headache or visual complaints? No  Neurologic changes like confusion? No  Extremity edema? No    BP Readings from Last 3 Encounters:   01/05/17 138/76   12/26/16 138/88   11/10/16 122/70           Health Maintenance   Topic Date Due   . Hepatitis C screen  [birth year]   . Diabetic retinal exam  06/11/1964   . HIV screen  06/11/1969   . DTaP/Tdap/Td vaccine (1 - Tdap) 06/11/1973   . Shingles Vaccine (1 of 2 - 2 Dose Series) 06/11/2004   . Low dose CT lung screening  06/11/2009   . Diabetic foot exam  07/14/2016   .  Diabetic microalbuminuria test  04/14/2017   . Lipid screen  05/31/2017   . Potassium monitoring  07/04/2017   . Creatinine monitoring  07/04/2017   . A1C test (Diabetic or Prediabetic)  07/19/2017   . Colon cancer screen colonoscopy  07/25/2023   . Flu vaccine  Completed   . Pneumococcal med risk  Completed       Past Medical History:   Diagnosis Date   . Arthritis     general   . CAD (coronary artery disease)     s/p stent placement   . Diabetes mellitus (HCC)    . GERD (gastroesophageal reflux disease)    . Hyperlipidemia    . Hypertension        Past Surgical History:   Procedure Laterality Date   . CORONARY ARTERY BYPASS GRAFT     . DIAGNOSTIC CARDIAC CATH LAB PROCEDURE     . ORTHOPEDIC SURGERY Left    . PTCA  07/04/2016       Social History   Substance Use Topics   . Smoking status: Former Smoker     Packs/day: 1.00     Years: 35.00     Types: Cigarettes     Quit date: 05/24/2008   . Smokeless tobacco: Never Used   . Alcohol use 0.6 oz/week     1 Glasses of wine per week      Comment: once monthly        Family History   Problem Relation Age of Onset   . Cancer Mother    . Heart Disease Brother    . Stroke Brother    . Diabetes Brother    . Early Death Brother    . High Blood Pressure Brother    . High Cholesterol Brother    . Mental Retardation Sister          I have reviewed the patient's past medical history, past surgical history, allergies, medications,  social and family history and I have made updates where appropriate.    Review of Systems        PHYSICAL EXAM:  BP 138/76   Pulse 64   Temp 98.1 F (36.7 C) (Oral)   Resp 16   Ht 5\' 9"  (1.753 m)   Wt 221 lb (100.2 kg)   BMI 32.64 kg/m     General Appearance: well developed and well- nourished, in no acute distress  Head: normocephalic and atraumatic  ENT: external ear and ear canal normal bilaterally, nose without deformity, nasal mucosa and turbinates normal without polyps, oropharynx normal, dentition is normal for age, no lipor gum lesions noted  Neck: supple and non-tender without mass, no thyromegaly or thyroid nodules, no cervical lymphadenopathy  Pulmonary/Chest: clear to auscultation bilaterally- no wheezes, rales or rhonchi, normal air movement, no respiratory distress or retractions  Cardiovascular: normal rate, regular rhythm, normal S1 and S2, no murmurs, rubs, clicks, or gallops, distal pulses intact  Abdomen: soft, non-tender, non-distended, no rebound or guarding, no masses or hernias noted, no hepatospleenomegaly  Extremities: no cyanosis, clubbing or edema of the lower extremities  Psych:  Normal affect without evidence of depression oranxiety, insight and judgement are appropriate, memory appears intact  Skin: warm and dry, no rash or erythema      ASSESSMENT & PLAN  Micheal [name] was seen today for follow-up, discuss medications and other.    Diagnoses and all orders for this visit:    Type 2 diabetes mellitus without complication, without long-term current use  of insulin (HCC)  -     POCT glycosylated hemoglobin (Hb A1C)    Essential hypertension    GAD (generalized anxiety disorder)  -     diazepam (VALIUM) 5 MG tablet; Take 1 tablet by mouth every 8 hours as needed for Anxiety for up to 30 days..  -     PARoxetine (PAXIL) 20 MG tablet; Take 1 tablet by mouth daily    Chronic bilateral low back pain with bilateral sciatica  -     oxyCODONE-acetaminophen (PERCOCET) 10-325 MG per tablet; Take 1  tablet by mouth every 8 hours as needed for Pain for up to 30 days..  -     naloxone (NARCAN) 4 MG/0.1ML LIQD nasal spray; 1 spray by Nasal route as needed for Opioid Reversal    -Will trial increasing valium to 5mg  PO q 8hrs, we discussed concerns for being on a Benzo and Opioid at the same time.  Will supply him with Narcan as well.   -Other chronic issues are stable, continue current medications  -Advised to call if any issues      Return in about 4 months (around 05/06/2017), or if symptoms worsen or fail to improve.    Controlled Substances Monitoring:     The Prescription Monitoring Report for this patient was reviewed today. [name](Conda Wannamaker D Treyvone Chelf, DO)    No signs of potential drug abuse or diversion identified. [name](Lynden Flemmer D Roy Tokarz, DO)          Dysen received counseling on the following healthy behaviors: medication adherence  Reviewed prior labs and health maintenance.  Continue current medications, diet and exercise.  Discussed use, benefit, and side effects of prescribed medications. Barriers to medication compliance addressed.   Patient given educational materials - see patient instructions.    All patient questions answered.  Patient voiced understanding.

## 2017-01-05 NOTE — Progress Notes (Signed)
Have you changed or started any medications since your last visit including any over-the-counter medicines, vitamins, or herbal medicines? no   Are you having any side effects from any of your medications? -  no  Have you stopped taking any of your medications? Is so, why? -  no    Have you seen any other physician or provider since your last visit? Yes - Records Obtained  Have you had any other diagnostic tests since your last visit? No  Have you been seen in the emergency room and/or had an admission to a hospital since we last saw you? no  Have you had your routine dental cleaning in the past 6 months? no    Have you activated your MyChart account? If not, what are your barriers? No:      Patient Care Team:  Cecille RubinMark D Kahle, DO as PCP - General (Family Medicine)  Cecille RubinMark D Kahle, DO as PCP - [facility] Attributed Provider    Medical History Review  Past Medical, Family, and Social History     Defer to provider.

## 2017-01-06 ENCOUNTER — Encounter

## 2017-01-06 MED ORDER — MONTELUKAST SODIUM 10 MG PO TABS
10 | ORAL_TABLET | Freq: Every day | ORAL | 5 refills | Status: DC
Start: 2017-01-06 — End: 2017-06-26

## 2017-01-06 NOTE — Telephone Encounter (Signed)
Micheal Birminghamavid A Morin called requesting a refill on the following medications:  Requested Prescriptions     Pending Prescriptions Disp Refills   . montelukast (SINGULAIR) 10 MG tablet 30 tablet 5     Sig: Take 1 tablet by mouth daily     Pharmacy verified: [facility] [place]  .pv      Date of last visit: 01/05/17  Date of next visit (if applicable): 05/05/2017

## 2017-01-11 ENCOUNTER — Encounter: Attending: Family Medicine | Primary: Family

## 2017-02-02 ENCOUNTER — Encounter

## 2017-02-02 MED ORDER — OXYCODONE-ACETAMINOPHEN 10-325 MG PO TABS
10-325 | ORAL_TABLET | Freq: Three times a day (TID) | ORAL | 0 refills | Status: DC | PRN
Start: 2017-02-02 — End: 2017-03-01

## 2017-02-02 MED ORDER — DIAZEPAM 5 MG PO TABS
5 | ORAL_TABLET | Freq: Three times a day (TID) | ORAL | 0 refills | Status: DC | PRN
Start: 2017-02-02 — End: 2017-03-01

## 2017-02-02 NOTE — Telephone Encounter (Signed)
Controlled Substances Monitoring:     RX Monitoring 02/02/2017   Attestation The Prescription Monitoring Report for this patient was reviewed today.   Documentation No signs of potential drug abuse or diversion identified.

## 2017-02-02 NOTE — Telephone Encounter (Signed)
02/02/17   Micheal [name] called requesting a refill on the following medications:  Requested Prescriptions     Pending Prescriptions Disp Refills   . oxyCODONE-acetaminophen (PERCOCET) 10-325 MG per tablet 90 tablet 0     Sig: Take 1 tablet by mouth every 8 hours as needed for Pain for up to 30 days..   . diazepam (VALIUM) 5 MG tablet 90 tablet 0     Sig: Take 1 tablet by mouth every 8 hours as needed for Anxiety for up to 30 days..     Pharm[facility] verified: [facility]  [facility]      Date of last visit:  01/05/17  Date of next visit (if applicable): 05/05/17  blm

## 2017-02-07 NOTE — Telephone Encounter (Signed)
Received approval from [facility] for oxcod/apap  Pt informed and has already picked up the medication from the pharmacy

## 2017-02-13 MED ORDER — ISOSORBIDE MONONITRATE ER 30 MG PO TB24
30 MG | ORAL_TABLET | ORAL | 3 refills | Status: DC
Start: 2017-02-13 — End: 2018-02-16

## 2017-02-13 MED ORDER — BRILINTA 90 MG PO TABS
90 MG | ORAL_TABLET | ORAL | 3 refills | Status: DC
Start: 2017-02-13 — End: 2018-03-19

## 2017-02-13 MED ORDER — METOPROLOL SUCCINATE ER 25 MG PO TB24
25 MG | ORAL_TABLET | ORAL | 3 refills | Status: DC
Start: 2017-02-13 — End: 2018-02-16

## 2017-02-13 MED ORDER — ATORVASTATIN CALCIUM 80 MG PO TABS
80 MG | ORAL_TABLET | ORAL | 3 refills | Status: DC
Start: 2017-02-13 — End: 2018-03-19

## 2017-03-01 ENCOUNTER — Encounter

## 2017-03-01 MED ORDER — DIAZEPAM 5 MG PO TABS
5 | ORAL_TABLET | Freq: Three times a day (TID) | ORAL | 0 refills | Status: DC | PRN
Start: 2017-03-01 — End: 2017-03-28

## 2017-03-01 MED ORDER — OXYCODONE-ACETAMINOPHEN 10-325 MG PO TABS
10-325 | ORAL_TABLET | Freq: Three times a day (TID) | ORAL | 0 refills | Status: DC | PRN
Start: 2017-03-01 — End: 2017-03-28

## 2017-03-01 NOTE — Telephone Encounter (Signed)
03/01/17    Micheal [name] called requesting a refill on the following medications:  Requested Prescriptions     Pending Prescriptions Disp Refills   . diazepam (VALIUM) 5 MG tablet 90 tablet 0     Sig: Take 1 tablet by mouth every 8 hours as needed for Anxiety for up to 30 days..   . oxyCODONE-acetaminophen (PERCOCET) 10-325 MG per tablet 90 tablet 0     Sig: Take 1 tablet by mouth every 8 hours as needed for Pain for up to 30 days..     Pharm[facility] verified: [facility]  [facility]      Date of last visit:  01/05/17  Date of next visit (if applicable): 05/05/2017  blm

## 2017-03-28 ENCOUNTER — Encounter

## 2017-03-28 MED ORDER — OXYCODONE-ACETAMINOPHEN 10-325 MG PO TABS
10-325 MG | ORAL_TABLET | Freq: Three times a day (TID) | ORAL | 0 refills | Status: DC | PRN
Start: 2017-03-28 — End: 2017-04-27

## 2017-03-28 MED ORDER — DIAZEPAM 5 MG PO TABS
5 MG | ORAL_TABLET | Freq: Three times a day (TID) | ORAL | 0 refills | Status: DC | PRN
Start: 2017-03-28 — End: 2017-04-27

## 2017-03-28 NOTE — Telephone Encounter (Signed)
Micheal [name] called requesting a refill on the following medications:  Requested Prescriptions     Pending Prescriptions Disp Refills   ??? diazepam (VALIUM) 5 MG tablet 90 tablet 0     Sig: Take 1 tablet by mouth every 8 hours as needed for Anxiety for up to 30 days.   ??? oxyCODONE-acetaminophen (PERCOCET) 10-325 MG per tablet 90 tablet 0     Sig: Take 1 tablet by mouth every 8 hours as needed for Pain for up to 30 days.     Pharmacy verified: [facility] [address]  .pv      Date of last visit: 01/05/17  Date of next visit (if applicable): 05/05/2017

## 2017-04-10 ENCOUNTER — Encounter

## 2017-04-10 MED ORDER — ESOMEPRAZOLE MAGNESIUM 40 MG PO CPDR
40 MG | ORAL_CAPSULE | ORAL | 5 refills | Status: DC
Start: 2017-04-10 — End: 2017-11-03

## 2017-04-10 MED ORDER — METFORMIN HCL 1000 MG PO TABS
1000 MG | ORAL_TABLET | Freq: Two times a day (BID) | ORAL | 3 refills | Status: DC
Start: 2017-04-10 — End: 2017-10-24

## 2017-04-10 MED ORDER — FENOFIBRATE 145 MG PO TABS
145 MG | ORAL_TABLET | ORAL | 5 refills | Status: DC
Start: 2017-04-10 — End: 2017-10-03

## 2017-04-14 ENCOUNTER — Encounter: Attending: Cardiovascular Disease | Primary: Family

## 2017-04-27 ENCOUNTER — Ambulatory Visit: Admit: 2017-04-27 | Discharge: 2017-04-27 | Payer: MEDICARE | Attending: Family Medicine | Primary: Family

## 2017-04-27 DIAGNOSIS — E119 Type 2 diabetes mellitus without complications: Secondary | ICD-10-CM

## 2017-04-27 LAB — POCT MICROALBUMIN
Creatinine Ur POCT: 50
Microalb, Ur: 10
Microalbumin Creatinine Ratio: <30

## 2017-04-27 LAB — POCT GLYCOSYLATED HEMOGLOBIN (HGB A1C): Hemoglobin A1C: 6.7 %

## 2017-04-27 MED ORDER — LISINOPRIL 40 MG PO TABS
40 MG | ORAL_TABLET | ORAL | 3 refills | Status: DC
Start: 2017-04-27 — End: 2018-08-13

## 2017-04-27 MED ORDER — DIAZEPAM 5 MG PO TABS
5 MG | ORAL_TABLET | Freq: Three times a day (TID) | ORAL | 0 refills | Status: DC | PRN
Start: 2017-04-27 — End: 2017-05-24

## 2017-04-27 MED ORDER — OXYCODONE-ACETAMINOPHEN 10-325 MG PO TABS
10-325 MG | ORAL_TABLET | Freq: Three times a day (TID) | ORAL | 0 refills | Status: DC | PRN
Start: 2017-04-27 — End: 2017-05-24

## 2017-04-27 NOTE — Progress Notes (Signed)
Micheal [name] is a 62 y.o. male that presents for     Chief Complaint   Patient presents with   . Follow-up     DM        BP 124/76   Pulse 63   Temp 97.6 F (36.4 C) (Oral)   Resp 14   Ht 5\' 8"  (1.727 m)   Wt 224 lb (101.6 kg)   BMI 34.06 kg/m       HPI:    GAD:  He is on valium which I did restart about 6 months ago.  States that he is doing well overall.  States that in general, sx are well controlled.  Notes that he has taken the Valium q 6 hrs occasionally due to increased anxiety.      Chronic pain has been reasonably controlled with percocet.  His back surgery had to be delayed due to having DES placed.  States that he still gets pain, but he is staying functional with this.    Diabetes Type 2    Glucose control:   Does patient check blood glucoses at home?  No  Report of hypoglycemia: no  Lab Results   Component Value Date    LABA1C 6.7 04/27/2017     No results found for: EAG    Symptoms  Polyuria, Polydipsia or Polyphagia?   No  Chest Pain, SOB, or Palpitations? -  Notes occasional SOB, no CP noted with this  New Vision complaints? No  Paresthesias of the extremities?  No    Medications  Current medication were reviewed.  Compliant with medications?  yes  Medication side effects?  No  On ACE-I or ARB?  Yes  On antiplatelet therapy?  Yes  On Statin?  Yes      Exercise  Exercise? Staying active every day  Wt Readings from Last 3 Encounters:   04/27/17 224 lb (101.6 kg)   01/05/17 221 lb (100.2 kg)   12/26/16 218 lb 9.6 oz (99.2 kg)       Diet discipline?:  Low salt, fat, sugar diet?  no    Blood pressure control:  BP Readings from Last 3 Encounters:   04/27/17 124/76   01/05/17 138/76   12/26/16 138/88       Lab Results   Component Value Date    LABMICR 1.28 09/03/2014       Lab Results   Component Value Date    LDLCALC 91 05/31/2016     HTN    Does patient check BP regularly at home? - No  Current Medication regimen - Lisinopril, Metoprolol,   Tolerating medications well? - yes    Shortness of  breath or chest pain? No  Headache or visual complaints? No  Neurologic changes like confusion? No  Extremity edema? No    BP Readings from Last 3 Encounters:   04/27/17 124/76   01/05/17 138/76   12/26/16 138/88           Health Maintenance   Topic Date Due   . Hepatitis C screen  [birth year]   . Diabetic retinal exam  06/11/1964   . HIV screen  06/11/1969   . DTaP/Tdap/Td vaccine (1 - Tdap) 06/11/1973   . Shingles Vaccine (1 of 2) 06/11/2004   . Low dose CT lung screening  06/11/2009   . Diabetic foot exam  07/14/2016   . Diabetic microalbuminuria test  04/14/2017   . Lipid screen  05/31/2017   . Potassium monitoring  07/04/2017   .  Creatinine monitoring  07/04/2017   . A1C test (Diabetic or Prediabetic)  01/05/2018   . Colon cancer screen colonoscopy  07/25/2023   . Flu vaccine  Completed   . Pneumococcal 0-64 years at Risk Vaccine  Completed       Past Medical History:   Diagnosis Date   . Arthritis     general   . CAD (coronary artery disease)     s/p stent placement   . Diabetes mellitus (HCC)    . GERD (gastroesophageal reflux disease)    . Hyperlipidemia    . Hypertension        Past Surgical History:   Procedure Laterality Date   . CORONARY ARTERY BYPASS GRAFT     . DIAGNOSTIC CARDIAC CATH LAB PROCEDURE     . ORTHOPEDIC SURGERY Left    . PTCA  07/04/2016       Social History     Tobacco Use   . Smoking status: Former Smoker     Packs/day: 1.00     Years: 35.00     Pack years: 35.00     Types: Cigarettes     Last attempt to quit: 05/24/2008     Years since quitting: 8.9   . Smokeless tobacco: Never Used   Substance Use Topics   . Alcohol use: Yes     Alcohol/week: 0.6 oz     Types: 1 Glasses of wine per week     Comment: once monthly    . Drug use: No       Family History   Problem Relation Age of Onset   . Cancer Mother    . Heart Disease Brother    . Stroke Brother    . Diabetes Brother    . Early Death Brother    . High Blood Pressure Brother    . High Cholesterol Brother    . Mental Retardation Sister           I have reviewed the patient's past medical history, past surgical history, allergies, medications, social and family history and I have made updates where appropriate.    Review of Systems        PHYSICAL EXAM:  BP 124/76   Pulse 63   Temp 97.6 F (36.4 C) (Oral)   Resp 14   Ht 5\' 8"  (1.727 m)   Wt 224 lb (101.6 kg)   BMI 34.06 kg/m     General Appearance: well developed and well- nourished, in no acute distress  Head: normocephalic and atraumatic  ENT: external ear and ear canal normal bilaterally, nose without deformity, nasal mucosa and turbinates normal without polyps, oropharynx normal, dentition is normal for age, no lipor gum lesions noted  Neck: supple and non-tender without mass, no thyromegaly or thyroid nodules, no cervical lymphadenopathy  Pulmonary/Chest: clear to auscultation bilaterally- no wheezes, rales or rhonchi, normal air movement, no respiratory distress or retractions  Cardiovascular: normal rate, regular rhythm, normal S1 and S2, no murmurs, rubs, clicks, or gallops, distal pulses intact  Extremities: no cyanosis, clubbing or edema of the lower extremities  Psych:  Normal affect without evidence of depression oranxiety, insight and judgement are appropriate, memory appears intact  Skin: warm and dry, no rash or erythema      ASSESSMENT & PLAN  Micheal [name] was seen today for follow-up.    Diagnoses and all orders for this visit:    Type 2 diabetes mellitus without complication, without long-term current use of insulin (HCC)  -  POCT glycosylated hemoglobin (Hb A1C)  -     POCT microalbumin    GAD (generalized anxiety disorder)  -     diazepam (VALIUM) 5 MG tablet; Take 1 tablet by mouth every 8 hours as needed for Anxiety for up to 30 days.    Chronic bilateral low back pain with bilateral sciatica  -     oxyCODONE-acetaminophen (PERCOCET) 10-325 MG per tablet; Take 1 tablet by mouth every 8 hours as needed for Pain for up to 30 days.    Essential hypertension  -     lisinopril  (PRINIVIL;ZESTRIL) 40 MG tablet; TAKE 1 TABLET BY MOUTH DAILY    Coronary artery disease of native heart with stable angina pectoris, unspecified vessel or lesion type (HCC)    Angina, class III (HCC)    -Chronic issues stable, continue current medications  -Advised to call if any issues        Return in about 4 months (around 08/27/2017), or if symptoms worsen or fail to improve.    Controlled Substances Monitoring:     The Prescription Monitoring Report for this patient was reviewed today. [name], DO)               Micheal [name] received counseling on the following healthy behaviors: medication adherence  Reviewed prior labs and health maintenance.  Continue current medications, diet and exercise.  Discussed use, benefit, and side effects of prescribed medications. Barriers to medication compliance addressed.   Patient given educational materials - see patient instructions.    All patient questions answered.  Patient voiced understanding.

## 2017-04-27 NOTE — Progress Notes (Signed)
Have you changed or started any medications since your last visit including any over-the-counter medicines, vitamins, or herbal medicines? no   Are you having any side effects from any of your medications? -  no  Have you stopped taking any of your medications? Is so, why? -  no    Have you seen any other physician or provider since your last visit? No  Have you had any other diagnostic tests since your last visit? No  Have you been seen in the emergency room and/or had an admission to a hospital since we last saw you? No  Have you had your routine dental cleaning in the past 6 months? no    Have you activated your MyChart account? If not, what are your barriers? No:      Patient Care Team:  Mark D Kahle, DO as PCP - General (Family Medicine)  Mark D Kahle, DO as PCP - [facility] Attributed Provider    Medical History Review  Past Medical, Family, and Social History     Defer to provider.

## 2017-05-05 ENCOUNTER — Encounter: Attending: Family Medicine | Primary: Family

## 2017-05-15 ENCOUNTER — Encounter

## 2017-05-15 MED ORDER — OLOPATADINE HCL 0.1 % OP SOLN
0.1 % | Freq: Two times a day (BID) | OPHTHALMIC | 5 refills | Status: DC
Start: 2017-05-15 — End: 2018-04-25

## 2017-05-15 MED ORDER — IPRATROPIUM BROMIDE 0.03 % NA SOLN
0.03 % | Freq: Two times a day (BID) | NASAL | 5 refills | Status: DC
Start: 2017-05-15 — End: 2018-02-02

## 2017-05-15 NOTE — Telephone Encounter (Signed)
Pt called and wanted to have his nasal spray and eye drops rx sent to the [facility] in [place] Phone # [phone]  DOLV: 04/27/17

## 2017-05-15 NOTE — Telephone Encounter (Signed)
Verified with pt which meds he needs  meds  pended  Pharmacy updated

## 2017-05-24 ENCOUNTER — Encounter

## 2017-05-24 MED ORDER — DIAZEPAM 5 MG PO TABS
5 MG | ORAL_TABLET | Freq: Three times a day (TID) | ORAL | 0 refills | Status: DC | PRN
Start: 2017-05-24 — End: 2017-06-22

## 2017-05-24 MED ORDER — OXYCODONE-ACETAMINOPHEN 10-325 MG PO TABS
10-325 MG | ORAL_TABLET | Freq: Three times a day (TID) | ORAL | 0 refills | Status: DC | PRN
Start: 2017-05-24 — End: 2017-10-24

## 2017-05-24 NOTE — Telephone Encounter (Signed)
Micheal [name] called requesting a refill on the following medications:  Requested Prescriptions     Pending Prescriptions Disp Refills   ??? diazepam (VALIUM) 5 MG tablet 90 tablet 0     Sig: Take 1 tablet by mouth every 8 hours as needed for Anxiety for up to 30 days.   ??? oxyCODONE-acetaminophen (PERCOCET) 10-325 MG per tablet 90 tablet 0     Sig: Take 1 tablet by mouth every 8 hours as needed for Pain for up to 30 days.     [facility]      Date of last visit: 4/4  Date of next visit (if applicable): 08/28/2017

## 2017-05-31 NOTE — Telephone Encounter (Signed)
Message from [facility] Burden sent at 05/31/2017 9:14 AM EDT     Summary: upset stomach and diarrhea     Patient has an upset stomach and diarrhea. He is asking what he can take over the counter, that won't effect his other medications             Call History      Type Contact Phone User   05/31/2017 09:13 AM Phone (Incoming) Julio, Stephani [phone] (H) Melissa Burden

## 2017-05-31 NOTE — Telephone Encounter (Signed)
Reason for Disposition  ??? MILD-MODERATE diarrhea (e.g., 1-6 times / day more than normal)    Answer Assessment - Initial Assessment Questions  1. DIARRHEA SEVERITY: "How bad is the diarrhea?" "How many extra stools have you had in the past 24 hours than normal?"     - MILD: Few loose or mushy BMs; increase of 1-3 stools over normal daily number of stools; mild increase in ostomy output.    - MODERATE: Increase of 4-6 stools daily over normal; moderate increase in ostomy output.    - SEVERE (or Worst Possible): Increase of 7 or more stools daily over normal; moderate increase in ostomy output; incontinence.      Mod- 4-5 stools yesterday and 1 this morning, loose, no blood   2. ONSET: "When did the diarrhea begin?"       yesterday  3. BM CONSISTENCY: "How loose or watery is the diarrhea?"       watery  4. VOMITING: "Are you also vomiting?" If so, ask: "How many times in the past 24 hours?"       None   5. ABDOMINAL PAIN: "Are you having any abdominal pain?" If yes: "What does it feel like?" (e.g., crampy, dull, intermittent, constant)       None- just feels like he has gas- goes into the bathroom and has a lot of diarrhea  6. ABDOMINAL PAIN SEVERITY: If present, ask: "How bad is the pain?"  (e.g., Scale 1-10; mild, moderate, or severe)     - MILD (1-3): doesn't interfere with normal activities, abdomen soft and not tender to touch      - MODERATE (4-7): interferes with normal activities or awakens from sleep, tender to touch      - SEVERE (8-10): excruciating pain, doubled over, unable to do any normal activities        none  7. ORAL INTAKE: If vomiting, "Have you been able to drink liquids?" "How much fluids have you had in the past 24 hours?"    8. HYDRATION: "Any signs of dehydration?" (e.g., dry mouth [not just dry lips], too weak to stand, dizziness, new weight loss) "When did you last urinate?"      No vomiting , urination normal, drinking well   9. EXPOSURE: "Have you traveled to a foreign country recently?"  "Have you been exposed to anyone with diarrhea?" "Could you have eaten any food that was spoiled?"      Not sure   10. ANTIBIOTIC USE: "Are you taking antibiotics now or have you taken antibiotics in the past 2 months?"        no  11. OTHER SYMPTOMS: "Do you have any other symptoms?" (e.g., fever, blood in stool)        none  12. PREGNANCY: "Is there any chance you are pregnant?" "When was your last menstrual period?"        na    Protocols used: DIARRHEA-ADULT-AH

## 2017-06-22 ENCOUNTER — Encounter

## 2017-06-22 MED ORDER — DIAZEPAM 5 MG PO TABS
5 MG | ORAL_TABLET | Freq: Three times a day (TID) | ORAL | 0 refills | Status: DC | PRN
Start: 2017-06-22 — End: 2017-07-20

## 2017-06-22 NOTE — Telephone Encounter (Signed)
Patient is calling in regarding his medications. He is due for his refill of diazepam 5 mg, but he was asking if it could be increased? He doesn't feel like it is working as well anymore. He also wanted to let Dr Ulyess Mort know that he did stop taking his percocet. He picked up his last prescription but barely took any because he didn't feel like they were helping his pain. He said after he stopped it he had diarrhea for a few days but then no other issues. He is still having pain but does not want anything stronger either. Please call him with any recommendations, and his pharmacy is [facility] on [address].

## 2017-06-22 NOTE — Telephone Encounter (Signed)
He is already on a fairly high dose of the Valium so I would not recommend increasing this any further.  If the anxiety is persisting, I would recommend that we refer him to psychiatry as he has been on multiple medications but continue to have significant anxiety sx.

## 2017-06-22 NOTE — Telephone Encounter (Signed)
Pt stopped taking pain medication any longer,because it is not doing anything for him. Notified pt that valium was refilled at current dose but will not be increased by Dr. Ulyess Mort .pt refuses referral to Psychiatry at this time.

## 2017-06-26 ENCOUNTER — Encounter

## 2017-06-26 MED ORDER — MONTELUKAST SODIUM 10 MG PO TABS
10 MG | ORAL_TABLET | Freq: Every day | ORAL | 3 refills | Status: DC
Start: 2017-06-26 — End: 2018-08-13

## 2017-07-18 ENCOUNTER — Encounter

## 2017-07-18 MED ORDER — GLIMEPIRIDE 4 MG PO TABS
4 MG | ORAL_TABLET | ORAL | 1 refills | Status: DC
Start: 2017-07-18 — End: 2018-02-08

## 2017-07-20 ENCOUNTER — Encounter

## 2017-07-20 MED ORDER — DIAZEPAM 5 MG PO TABS
5 MG | ORAL_TABLET | Freq: Three times a day (TID) | ORAL | 0 refills | Status: DC | PRN
Start: 2017-07-20 — End: 2017-08-16

## 2017-07-20 NOTE — Telephone Encounter (Signed)
[name] called requesting a refill on the following medications:  Requested Prescriptions     Pending Prescriptions Disp Refills   . diazepam (VALIUM) 5 MG tablet 90 tablet 0     Sig: Take 1 tablet by mouth every 8 hours as needed for Anxiety for up to 30 days.     Pharmacy verified: [facility]      Date of last visit: 04/27/17  Date of next visit (if applicable): 08/28/2017

## 2017-08-10 NOTE — Telephone Encounter (Signed)
error 

## 2017-08-16 ENCOUNTER — Encounter

## 2017-08-16 MED ORDER — DIAZEPAM 5 MG PO TABS
5 MG | ORAL_TABLET | Freq: Three times a day (TID) | ORAL | 0 refills | Status: DC | PRN
Start: 2017-08-16 — End: 2017-09-13

## 2017-08-16 NOTE — Telephone Encounter (Signed)
08/16/17   Micheal [name] called requesting a refill on the following medications:  Requested Prescriptions     Pending Prescriptions Disp Refills   ??? diazepam (VALIUM) 5 MG tablet 90 tablet 0     Sig: Take 1 tablet by mouth every 8 hours as needed for Anxiety for up to 30 days.     Pharmacy verified: [facility]  [facility]      Date of last visit:  04/27/17  Date of next visit (if applicable): 08/28/2017  blm

## 2017-08-28 ENCOUNTER — Encounter: Attending: Cardiovascular Disease | Primary: Family

## 2017-08-28 ENCOUNTER — Encounter: Payer: MEDICARE | Attending: Family Medicine | Primary: Family

## 2017-09-04 ENCOUNTER — Encounter: Attending: Cardiovascular Disease | Primary: Family

## 2017-09-06 ENCOUNTER — Encounter: Attending: Cardiovascular Disease | Primary: Family

## 2017-09-13 ENCOUNTER — Encounter

## 2017-09-13 MED ORDER — DIAZEPAM 5 MG PO TABS
5 MG | ORAL_TABLET | Freq: Three times a day (TID) | ORAL | 0 refills | Status: DC | PRN
Start: 2017-09-13 — End: 2017-10-13

## 2017-09-13 NOTE — Telephone Encounter (Signed)
Micheal [name] called requesting a refill on the following medications:  Requested Prescriptions     Pending Prescriptions Disp Refills   ??? diazepam (VALIUM) 5 MG tablet 90 tablet 0     Sig: Take 1 tablet by mouth every 8 hours as needed for Anxiety for up to 30 days.     [facility]      Date of last visit: 04/27/17  Date of next visit (if applicable): 10/24/2017

## 2017-10-03 ENCOUNTER — Encounter

## 2017-10-03 MED ORDER — FENOFIBRATE 145 MG PO TABS
145 MG | ORAL_TABLET | ORAL | 3 refills | Status: DC
Start: 2017-10-03 — End: 2018-10-24

## 2017-10-03 NOTE — Telephone Encounter (Signed)
[name] called requesting a refill on the following medications:  Requested Prescriptions     Pending Prescriptions Disp Refills   ??? fenofibrate (TRICOR) 145 MG tablet 30 tablet 5     Sig: TAKE 1 TABLET BY MOUTH DAILY     [facility] verified[facility]  [facility]      Date of last visit: 4/4  Date of next visit (if applicable): 10/24/2017

## 2017-10-13 ENCOUNTER — Encounter

## 2017-10-13 MED ORDER — DIAZEPAM 5 MG PO TABS
5 MG | ORAL_TABLET | Freq: Three times a day (TID) | ORAL | 0 refills | Status: DC | PRN
Start: 2017-10-13 — End: 2017-11-09

## 2017-10-13 NOTE — Telephone Encounter (Signed)
[name] called requesting a refill on the following medications:  Requested Prescriptions     Pending Prescriptions Disp Refills   . diazepam (VALIUM) 5 MG tablet 90 tablet 0     Sig: Take 1 tablet by mouth every 8 hours as needed for Anxiety for up to 30 days.     Pharmacy verified: [facility] [address]  .[facility]      Date of last visit: 04/27/17  Date of next visit (if applicable): 10/24/2017

## 2017-10-24 ENCOUNTER — Encounter: Admit: 2017-10-24 | Discharge: 2017-10-24 | Primary: Family

## 2017-10-24 ENCOUNTER — Ambulatory Visit: Admit: 2017-10-24 | Discharge: 2017-10-24 | Payer: MEDICARE | Attending: Family Medicine | Primary: Family

## 2017-10-24 DIAGNOSIS — Z1159 Encounter for screening for other viral diseases: Secondary | ICD-10-CM

## 2017-10-24 DIAGNOSIS — E119 Type 2 diabetes mellitus without complications: Secondary | ICD-10-CM

## 2017-10-24 LAB — POCT GLYCOSYLATED HEMOGLOBIN (HGB A1C): Hemoglobin A1C: 5.5 %

## 2017-10-24 LAB — COMPREHENSIVE METABOLIC PANEL
ALT: 41 U/L (ref 11–66)
AST: 30 U/L (ref 5–40)
Albumin: 4.8 g/dL (ref 3.5–5.1)
Alkaline Phosphatase: 34 U/L — ABNORMAL LOW (ref 38–126)
BUN: 15 mg/dL (ref 7–22)
CO2: 24 meq/L (ref 23–33)
Calcium: 10.4 mg/dL (ref 8.5–10.5)
Chloride: 102 meq/L (ref 98–111)
Creatinine: 1 mg/dL (ref 0.4–1.2)
Glucose: 117 mg/dL — ABNORMAL HIGH (ref 70–108)
Potassium: 4.3 meq/L (ref 3.5–5.2)
Sodium: 140 meq/L (ref 135–145)
Total Bilirubin: 0.3 mg/dL (ref 0.3–1.2)
Total Protein: 8 g/dL (ref 6.1–8.0)

## 2017-10-24 LAB — LIPID PANEL
Cholesterol, Total: 147 mg/dL (ref 100–199)
HDL: 32 mg/dL
LDL Calculated: 88 mg/dL
Triglycerides: 135 mg/dL (ref 0–199)

## 2017-10-24 LAB — HEPATITIS C ANTIBODY: Hepatitis C Ab: NEGATIVE

## 2017-10-24 LAB — GLOMERULAR FILTRATION RATE, ESTIMATED: Est, Glom Filt Rate: 75 mL/min/{1.73_m2} — AB

## 2017-10-24 LAB — ANION GAP: Anion Gap: 14 meq/L (ref 8.0–16.0)

## 2017-10-24 MED ORDER — METFORMIN HCL 500 MG PO TABS
500 MG | ORAL_TABLET | Freq: Two times a day (BID) | ORAL | 1 refills | Status: DC
Start: 2017-10-24 — End: 2018-06-19

## 2017-10-24 MED ORDER — GABAPENTIN 300 MG PO CAPS
300 MG | ORAL_CAPSULE | ORAL | 5 refills | Status: DC
Start: 2017-10-24 — End: 2018-02-02

## 2017-10-24 MED ORDER — PAROXETINE HCL 40 MG PO TABS
40 MG | ORAL_TABLET | Freq: Every day | ORAL | 3 refills | Status: DC
Start: 2017-10-24 — End: 2018-10-24

## 2017-10-24 NOTE — Telephone Encounter (Signed)
Regarding Orthopedic referral dated 10/24/17.  Called [facility] to schedule an appt for the patient but they do not accept his [facility] [employer] insurance.  Called & talked to the patient, he will check with his insurance to find out who is in network & call our office with the doctor's name/address/ph#.

## 2017-10-24 NOTE — Telephone Encounter (Signed)
-----   Message from [name], DO sent at 10/24/2017  2:15 PM EDT -----  Labs look good, continue current medications.

## 2017-10-24 NOTE — Progress Notes (Signed)
Immunization(s) given during visit:    Immunizations Administered     Name Date Dose Route    Influenza, Quadv, IM, PF (6 mo and older Fluzone, Flulaval, Fluarix, and 3 yrs and older Afluria) 10/24/2017 0.5 mL Intramuscular    Site: Deltoid- Right    Lot: [id number]    NDC: 33332-319-02          Most recent Vaccine Information Sheet dated 09/07/17 given to pt

## 2017-10-24 NOTE — Patient Instructions (Signed)
You may receive a survey about your visit with us today. The feedback from our patients helps us identify what is working well and where the service to all patients can be enhanced. Thank you!

## 2017-10-24 NOTE — Progress Notes (Signed)
Micheal [name] is a 62 y.o. male that presents for     Chief Complaint   Patient presents with   ??? Follow-up     Pt presents for a 4 month f/u. Pt states he has been doing good.    ??? Other     Pt's insurance contacted him and told him he was due for annual labs as well as Hep C.        BP 126/68    Pulse 64    Temp 98.4 ??F (36.9 ??C)    Resp 10    Ht 5\' 8"  (1.727 m)    Wt 216 lb (98 kg)    BMI 32.84 kg/m??       HPI:    GAD:   On Paxil and valium.  Feels like the valium lasts for about 4 hours and then anxiety increases.  Agreeable to increasing his paxil.    Low back pain:  Stopped the Percocet due to concern for side effects.  States that his back pain is doing worse recently.  Pain starts in the right low back and radiates down the right leg.  Notes a sharp aching type pain.  Worse in the AM.  Has tried APAP and aleve without improvement.    Notes that he is having more difficulty getting and keeping an erection.      Diabetes Type 2    Glucose control:   Does patient check blood glucoses at home?  No  Report of hypoglycemia: no  Lab Results   Component Value Date    LABA1C 6.7 04/27/2017     No results found for: EAG    Symptoms  Polyuria, Polydipsia or Polyphagia?   No  Chest Pain, SOB, or Palpitations? -  No  New Vision complaints? No  Paresthesias of the extremities?  No    Medications  Current medication were reviewed.  Compliant with medications?  yes  Medication side effects?  Yes, notes diarrhea with the Metformin  On ACE-I or ARB?  Yes  On antiplatelet therapy?  Yes  On Statin?  Yes      Exercise  Exercise? Staying active every day  Wt Readings from Last 3 Encounters:   10/24/17 216 lb (98 kg)   04/27/17 224 lb (101.6 kg)   01/05/17 221 lb (100.2 kg)       Diet discipline?:  Low salt, fat, sugar diet?  no    Blood pressure control:  BP Readings from Last 3 Encounters:   10/24/17 126/68   04/27/17 124/76   01/05/17 138/76       Lab Results   Component Value Date    LABMICR 1.28 09/03/2014       Lab Results    Component Value Date    LDLCALC 91 05/31/2016     HTN    Does patient check BP regularly at home? - No  Current Medication regimen - Lisinopril, Metoprolol,   Tolerating medications well? - yes    Shortness of breath or chest pain? No  Headache or visual complaints? No  Neurologic changes like confusion? No  Extremity edema? No    BP Readings from Last 3 Encounters:   10/24/17 126/68   04/27/17 124/76   01/05/17 138/76           Health Maintenance   Topic Date Due   ??? Hepatitis C screen  Apr 03, 1955   ??? Diabetic retinal exam  06/11/1964   ??? HIV screen  06/11/1969   ???  DTaP/Tdap/Td vaccine (1 - Tdap) 06/11/1973   ??? Shingles Vaccine (1 of 2) 06/11/2004   ??? Low dose CT lung screening  06/11/2009   ??? Diabetic foot exam  07/14/2016   ??? Lipid screen  05/31/2017   ??? Annual Wellness Visit (AWV)  06/21/2017   ??? Potassium monitoring  07/04/2017   ??? Creatinine monitoring  07/04/2017   ??? Flu vaccine (1) 09/24/2017   ??? A1C test (Diabetic or Prediabetic)  04/28/2018   ??? Diabetic microalbuminuria test  04/28/2018   ??? Colon cancer screen colonoscopy  07/25/2023   ??? Pneumococcal 0-64 years Vaccine  Completed       Past Medical History:   Diagnosis Date   ??? Arthritis     general   ??? CAD (coronary artery disease)     s/p stent placement   ??? Diabetes mellitus (HCC)    ??? GERD (gastroesophageal reflux disease)    ??? Hyperlipidemia    ??? Hypertension        Past Surgical History:   Procedure Laterality Date   ??? CORONARY ARTERY BYPASS GRAFT     ??? DIAGNOSTIC CARDIAC CATH LAB PROCEDURE     ??? ORTHOPEDIC SURGERY Left    ??? PTCA  07/04/2016       Social History     Tobacco Use   ??? Smoking status: Former Smoker     Packs/day: 1.00     Years: 35.00     Pack years: 35.00     Types: Cigarettes     Last attempt to quit: 05/24/2008     Years since quitting: 9.4   ??? Smokeless tobacco: Never Used   Substance Use Topics   ??? Alcohol use: Yes     Alcohol/week: 1.0 standard drinks     Types: 1 Glasses of wine per week     Comment: once monthly    ??? Drug use: No        Family History   Problem Relation Age of Onset   ??? Cancer Mother    ??? Heart Disease Brother    ??? Stroke Brother    ??? Diabetes Brother    ??? Early Death Brother    ??? High Blood Pressure Brother    ??? High Cholesterol Brother    ??? Mental Retardation Sister          I have reviewed the patient's past medical history, past surgical history, allergies, medications, social and family history and I have made updates where appropriate.    Review of Systems        PHYSICAL EXAM:  BP 126/68    Pulse 64    Temp 98.4 ??F (36.9 ??C)    Resp 10    Ht 5\' 8"  (1.727 m)    Wt 216 lb (98 kg)    BMI 32.84 kg/m??     General Appearance: well developed and well- nourished, in no acute distress  Head: normocephalic and atraumatic  ENT: external ear and ear canal normal bilaterally, nose without deformity, nasal mucosa and turbinates normal without polyps, oropharynx normal, dentition is normal for age, no lipor gum lesions noted  Neck: supple and non-tender without mass, no thyromegaly or thyroid nodules, no cervical lymphadenopathy  Pulmonary/Chest: clear to auscultation bilaterally- no wheezes, rales or rhonchi, normal air movement, no respiratory distress or retractions  Cardiovascular: normal rate, regular rhythm, normal S1 and S2, no murmurs, rubs, clicks, or gallops, distal pulses intact  Extremities: no cyanosis, clubbing or edema of the  lower extremities  Psych:  Normal affect without evidence of depression oranxiety, insight and judgement are appropriate, memory appears intact  Skin: warm and dry, no rash or erythema      ASSESSMENT & PLAN  Micheal [name] was seen today for follow-up and other.    Diagnoses and all orders for this visit:    Type 2 diabetes mellitus without complication, without long-term current use of insulin (HCC)  -     metFORMIN (GLUCOPHAGE) 500 MG tablet; Take 1 tablet by mouth 2 times daily (with meals)  -     Comprehensive Metabolic Panel; Future  -     Lipid Panel; Future    Essential hypertension    GAD  (generalized anxiety disorder)  -     PARoxetine (PAXIL) 40 MG tablet; Take 1 tablet by mouth daily    Spinal stenosis of lumbosacral region  -     gabapentin (NEURONTIN) 300 MG capsule; Take 1 tab PO qhs x 1 week, then take 1 tab PO BID x 1 week, then take 1 tab PO TID.  Albina Billet, MD, Orthopedic Surgery, [facility]    Vasculogenic erectile dysfunction, unspecified vasculogenic erectile dysfunction type  -     [place] - Kristin Bruins, MD, Urology, [facility]    Encounter for hepatitis C screening test for low risk patient  -     Hepatitis C Antibody; Future    -Start gabapentin and refer back to Ortho for his back issues  -Will increase Paxil for uncontrolled GAD  -Unable to do PDE5 inhibitors due to multiple nitrates, will refer to urology for treatment options  -Decrease Metformin to 500mg  BID due to diarrhea  -Other Chronic issues stable, continue current medications  -Advised to call if any issues        Return in about 4 months (around 02/24/2018), or if symptoms worsen or fail to improve.    Controlled Substances Monitoring:                     [place] received counseling on the following healthy behaviors: medication adherence  Reviewed prior labs and health maintenance.  Continue current medications, diet and exercise.  Discussed use, benefit, and side effects of prescribed medications. Barriers to medication compliance addressed.   Patient given educational materials - see patient instructions.    All patient questions answered.  Patient voiced understanding.

## 2017-10-24 NOTE — Telephone Encounter (Signed)
Spoke with pt, denied any questions at this time

## 2017-11-01 ENCOUNTER — Ambulatory Visit: Admit: 2017-11-01 | Discharge: 2017-11-01 | Payer: MEDICARE | Attending: Cardiovascular Disease | Primary: Family

## 2017-11-01 DIAGNOSIS — I25810 Atherosclerosis of coronary artery bypass graft(s) without angina pectoris: Secondary | ICD-10-CM

## 2017-11-01 NOTE — Telephone Encounter (Signed)
Patient stopped in and wanted to see the same Dr he saw in [place] at the [facility]. He is making his appointment with this provider since he just saw him last year. Therefore the  referral is cancelled for Dr Freddie Apley.    Scharschmidt, Ihor Austin, MD??   [address]   [address]??   [place], [place] [address]??   [phone]??   [phone] (Fax)??

## 2017-11-01 NOTE — Progress Notes (Signed)
[facility]  [facility]  [address]  [address]  [place] [address]  Dept: [phone]  Dept Fax: [phone]  Loc: [phone]    Visit Date: 11/01/2017    Micheal [name] is a 62 y.o. male who presents todayfor:  Chief Complaint   Patient presents with   ??? Coronary Artery Disease   ??? Hypertension   ??? Hyperlipidemia     Had stents last year   Known CABG   Back surgery cancelled due to that   Pending a visits with his ortho doctors  No chest pain  No changes in breathing  Active     HPI:  HPI  Past Medical History:   Diagnosis Date   ??? Arthritis     general   ??? CAD (coronary artery disease)     s/p stent placement   ??? Diabetes mellitus (HCC)    ??? GERD (gastroesophageal reflux disease)    ??? Hyperlipidemia    ??? Hypertension       Past Surgical History:   Procedure Laterality Date   ??? CORONARY ARTERY BYPASS GRAFT     ??? DIAGNOSTIC CARDIAC CATH LAB PROCEDURE     ??? ORTHOPEDIC SURGERY Left    ??? PTCA  07/04/2016     Family History   Problem Relation Age of Onset   ??? Cancer Mother    ??? Heart Disease Brother    ??? Stroke Brother    ??? Diabetes Brother    ??? Early Death Brother    ??? High Blood Pressure Brother    ??? High Cholesterol Brother    ??? Mental Retardation Sister      Social History     Tobacco Use   ??? Smoking status: Former Smoker     Packs/day: 1.00     Years: 35.00     Pack years: 35.00     Types: Cigarettes     Last attempt to quit: 05/24/2008     Years since quitting: 9.4   ??? Smokeless tobacco: Never Used   Substance Use Topics   ??? Alcohol use: Yes     Alcohol/week: 1.0 standard drinks     Types: 1 Glasses of wine per week     Comment: once monthly       Current Outpatient Medications   Medication Sig Dispense Refill   ??? gabapentin (NEURONTIN) 300 MG capsule Take 1 tab PO qhs x 1 week, then take 1 tab PO BID x 1 week, then take 1 tab PO TID. 90 capsule 5   ??? metFORMIN (GLUCOPHAGE) 500 MG tablet Take 1 tablet by mouth 2 times daily (with meals) 180 tablet 1   ??? PARoxetine  (PAXIL) 40 MG tablet Take 1 tablet by mouth daily 90 tablet 3   ??? diazepam (VALIUM) 5 MG tablet Take 1 tablet by mouth every 8 hours as needed for Anxiety for up to 30 days. 90 tablet 0   ??? fenofibrate (TRICOR) 145 MG tablet TAKE 1 TABLET BY MOUTH DAILY 90 tablet 3   ??? glimepiride (AMARYL) 4 MG tablet TAKE 1 TABLET BY MOUTH IN THE MORNING 90 tablet 1   ??? montelukast (SINGULAIR) 10 MG tablet Take 1 tablet by mouth daily 90 tablet 3   ??? olopatadine (PATANOL) 0.1 % ophthalmic solution Place 1 drop into both eyes 2 times daily 1 Bottle 5   ??? ipratropium (ATROVENT) 0.03 % nasal spray 2 sprays by Nasal route  2 times daily 1 Bottle 5   ??? lisinopril (PRINIVIL;ZESTRIL) 40 MG tablet TAKE 1 TABLET BY MOUTH DAILY 90 tablet 3   ??? esomeprazole (NEXIUM) 40 MG delayed release capsule TAKE 1 CAPSULE BY MOUTH EVERY MORNING BEFORE BREAKFAST 30 capsule 5   ??? isosorbide mononitrate (IMDUR) 30 MG extended release tablet TAKE 1 TABLET BY MOUTH ONCE DAILY 90 tablet 3   ??? atorvastatin (LIPITOR) 80 MG tablet TAKE 1 TABLET BY MOUTH NIGHTLY 90 tablet 3   ??? metoprolol succinate (TOPROL XL) 25 MG extended release tablet TAKE 1 TABLET BY MOUTH ONCE DAILY 90 tablet 3   ??? BRILINTA 90 MG TABS tablet TAKE 1 TABLET BY MOUTH TWICE DAILY 180 tablet 3   ??? nitroGLYCERIN (NITROSTAT) 0.4 MG SL tablet Place 1 tablet under the tongue every 5 minutes as needed for Chest pain 25 tablet 2   ??? aspirin 81 MG chewable tablet Take 1 tablet by mouth daily 30 tablet 0   ??? hydrocortisone 2.5 % cream Apply to external ear BID PRN 1 Tube 2   ??? Handicap Placard MISC by Does not apply route Expires 03/01/2020 1 each 0   ??? Multiple Vitamins-Minerals (CENTRUM SILVER ULTRA MENS) TABS Take 1 tablet by mouth daily       No current facility-administered medications for this visit.      No Known Allergies  Health Maintenance   Topic Date Due   ??? Diabetic retinal exam  06/11/1964   ??? HIV screen  06/11/1969   ??? DTaP/Tdap/Td vaccine (1 - Tdap) 06/11/1973   ??? Shingles Vaccine (1 of 2)  06/11/2004   ??? Low dose CT lung screening  06/11/2009   ??? Diabetic foot exam  07/14/2016   ??? Annual Wellness Visit (AWV)  06/21/2017   ??? Diabetic microalbuminuria test  04/28/2018   ??? A1C test (Diabetic or Prediabetic)  10/25/2018   ??? Lipid screen  10/25/2018   ??? Potassium monitoring  10/25/2018   ??? Creatinine monitoring  10/25/2018   ??? Colon cancer screen colonoscopy  07/25/2023   ??? Flu vaccine  Completed   ??? Pneumococcal 0-64 years Vaccine  Completed   ??? Hepatitis C screen  Completed       Subjective:  Review of Systems  General:   No fever, no chills, No fatigue or weight loss  Pulmonary:    No dyspnea, no wheezing  Cardiac:    Denies recent chest pain,   GI:     No nausea or vomiting, no abdominal pain  Neuro:    No dizziness or light headedness,   Musculoskeletal:  No recent active issues  Extremities:   No edema, no obvious claudication       Objective:  Physical Exam  BP 122/72    Pulse 84    Ht 5\' 7"  (1.702 m)    Wt 219 lb (99.3 kg)    BMI 34.30 kg/m??   General:   Well developed, well nourished  Lungs:   Clear to auscultation  Heart:    Normal S1 S2, Slight murmur. no rubs, no gallops  Abdomen:   Soft, non tender, no organomegalies, positive bowel sounds  Extremities:   No edema, no cyanosis, good peripheral pulses  Neurological:   Awake, alert, oriented. No obvious focal deficits  Musculoskelatal:  No obvious deformities    Assessment:      Diagnosis Orders   1. Coronary artery disease involving coronary bypass graft of native heart without angina pectoris  EKG 12 Lead  2. Essential hypertension     3. Pure hypercholesterolemia     cardiac fair for now   ECG in office was done today. I reviewed the ECG. No acute findings      Plan:  No follow-ups on file.  Clear for surgery   Okay to hold thinners short term if needed  Continue risk factor modification and medical management  Thank you for allowing me to participate in the care of your patient. Please don't hesitate to contact me regarding any further  issues related to the patient care    Orders Placed:  Orders Placed This Encounter   Procedures   ??? EKG 12 Lead     Order Specific Question:   Reason for Exam?     Answer:   Other       Medications Prescribed:  No orders of the defined types were placed in this encounter.         Discussed use, benefit, and side effects of prescribed medications. All patient questions answered. Pt voicedunderstanding. Instructed to continue current medications, diet and exercise. Continue risk factor modification and medical management. Patient agreed with treatment plan. Follow up as directed.    Electronically signedby [name] Balboa, MD on 11/01/2017 at 8:43 AM

## 2017-11-03 ENCOUNTER — Encounter

## 2017-11-03 MED ORDER — ESOMEPRAZOLE MAGNESIUM 40 MG PO CPDR
40 MG | ORAL_CAPSULE | ORAL | 3 refills | Status: DC
Start: 2017-11-03 — End: 2018-10-22

## 2017-11-06 ENCOUNTER — Encounter: Payer: MEDICARE | Attending: Family | Primary: Family

## 2017-11-06 ENCOUNTER — Ambulatory Visit: Admit: 2017-11-06 | Discharge: 2017-11-06 | Payer: MEDICARE | Attending: Family | Primary: Family

## 2017-11-06 DIAGNOSIS — N529 Male erectile dysfunction, unspecified: Secondary | ICD-10-CM

## 2017-11-06 LAB — POCT URINALYSIS DIPSTICK W/O MICROSCOPE (AUTO)
Bilirubin Urine: NEGATIVE
Blood, UA POC: NEGATIVE
Glucose, Ur: NEGATIVE mg/dl
Ketones, Urine: NEGATIVE
Leukocyte Clumps, Urine: NEGATIVE
Nitrite, Urine: NEGATIVE
Protein, Urine: NEGATIVE mg/dl
Specific Gravity, Urine: <=1.005 (ref 1.002–1.03)
Urobilinogen, Urine: 0.2 eu/dl (ref 0.0–1.0)
pH, Urine: 5.5 (ref 5.0–9.0)

## 2017-11-06 MED ORDER — TAMSULOSIN HCL 0.4 MG PO CAPS
0.4 MG | ORAL_CAPSULE | Freq: Every evening | ORAL | 3 refills | Status: DC
Start: 2017-11-06 — End: 2018-12-22

## 2017-11-06 NOTE — Telephone Encounter (Signed)
No unless he stops the imdur and no nitro

## 2017-11-06 NOTE — Progress Notes (Signed)
[facility]   [facility]  [address]  [address]  [place] [id number]  Dept: [phone]  Loc: [phone]  Visit Date: 11/06/2017      HPI:     Micheal BirminghamDavid A [name] is a 62 y.o. with past medical history of HTN, HLD, GERD who presents today for Erectile Dysfunction.   Micheal [name] c/o trouble getting and keeping an erection.  Has not tried any medications.  Does have a hx of DM and HTN.  On Imdur daily and Nitro prn.  Follows with Dr. Gabrielle DareBaki.  Micheal [name] also c/o urinary frequency and nocturia, couple times a night.  He denies any  dysuria, hesitancy, weak stream, urgency, gross hematuria, flank pain, fever, chills, SP pain, and feeling of incomplete emptying. Last PSA was 1.33 in 2016.  Urine today was negative for signs of infection.      Micheal [name] comes in alone. History is obtained from patient and medical records.      Current Outpatient Medications   Medication Sig Dispense Refill   ??? esomeprazole (NEXIUM) 40 MG delayed release capsule TAKE 1 CAPSULE BY MOUTH ONCE DAILY IN THE MORNING BEFORE BREAKFAST 90 capsule 3   ??? gabapentin (NEURONTIN) 300 MG capsule Take 1 tab PO qhs x 1 week, then take 1 tab PO BID x 1 week, then take 1 tab PO TID. 90 capsule 5   ??? metFORMIN (GLUCOPHAGE) 500 MG tablet Take 1 tablet by mouth 2 times daily (with meals) 180 tablet 1   ??? PARoxetine (PAXIL) 40 MG tablet Take 1 tablet by mouth daily 90 tablet 3   ??? diazepam (VALIUM) 5 MG tablet Take 1 tablet by mouth every 8 hours as needed for Anxiety for up to 30 days. 90 tablet 0   ??? fenofibrate (TRICOR) 145 MG tablet TAKE 1 TABLET BY MOUTH DAILY 90 tablet 3   ??? glimepiride (AMARYL) 4 MG tablet TAKE 1 TABLET BY MOUTH IN THE MORNING 90 tablet 1   ??? montelukast (SINGULAIR) 10 MG tablet Take 1 tablet by mouth daily 90 tablet 3   ??? olopatadine (PATANOL) 0.1 % ophthalmic solution Place 1 drop into both eyes 2 times daily 1 Bottle 5   ??? ipratropium (ATROVENT) 0.03 % nasal spray 2 sprays by Nasal route 2 times daily  1 Bottle 5   ??? lisinopril (PRINIVIL;ZESTRIL) 40 MG tablet TAKE 1 TABLET BY MOUTH DAILY 90 tablet 3   ??? isosorbide mononitrate (IMDUR) 30 MG extended release tablet TAKE 1 TABLET BY MOUTH ONCE DAILY 90 tablet 3   ??? atorvastatin (LIPITOR) 80 MG tablet TAKE 1 TABLET BY MOUTH NIGHTLY 90 tablet 3   ??? metoprolol succinate (TOPROL XL) 25 MG extended release tablet TAKE 1 TABLET BY MOUTH ONCE DAILY 90 tablet 3   ??? BRILINTA 90 MG TABS tablet TAKE 1 TABLET BY MOUTH TWICE DAILY 180 tablet 3   ??? nitroGLYCERIN (NITROSTAT) 0.4 MG SL tablet Place 1 tablet under the tongue every 5 minutes as needed for Chest pain 25 tablet 2   ??? aspirin 81 MG chewable tablet Take 1 tablet by mouth daily 30 tablet 0   ??? hydrocortisone 2.5 % cream Apply to external ear BID PRN 1 Tube 2   ??? Handicap Placard MISC by Does not apply route Expires 03/01/2020 1 each 0   ??? Multiple Vitamins-Minerals (CENTRUM SILVER ULTRA MENS) TABS Take 1 tablet by mouth daily       No current facility-administered  medications for this visit.        Past Medical History  Micheal [name]  has a past medical history of Arthritis, CAD (coronary artery disease), Diabetes mellitus (HCC), GERD (gastroesophageal reflux disease), Hyperlipidemia, and Hypertension.    Past Surgical History  The patient  has a past surgical history that includes Coronary artery bypass graft; orthopedic surgery (Left); Diagnostic Cardiac Cath Lab Procedure; and Percutaneous Transluminal Coronary Angio (07/04/2016).    Family History  This patient's family history includes Cancer in his mother; Diabetes in his brother; Early Death in his brother; Heart Disease in his brother; High Blood Pressure in his brother; High Cholesterol in his brother; Mental Retardation in his sister; Stroke in his brother.    Social History  Micheal [name]  reports that he quit smoking about 9 years ago. His smoking use included cigarettes. He has a 35.00 pack-year smoking history. He has never used smokeless tobacco. He reports that he drinks  about 1.0 standard drinks of alcohol per week. He reports that he does not use drugs.    Subjective:     Review of Systems  No problems with ears, nose or throat.  No problems with eyes.  No chest pain, shortness of breath, abdominal pain, extremity pain or weakness, and no neurological deficits.  No rashes.  GU symptoms per HPI.  The remainder of the review of symptoms is negative.    Objective:     PE:   Vitals:    11/06/17 1502   BP: 138/72   Weight: 212 lb (96.2 kg)   Height: 5\' 7"  (1.702 m)       Constitutional: Alert and oriented times 3, no acute distress and cooperative to examination with appropriate mood and affect.   HENT:   Head:        Normocephalic and atraumatic.   Mouth/Throat:         Mucous membranes are normal.   Eyes:         EOM are normal. No scleral icterus. PERRLA.   Neck:        Supple, symmetrical, trachea midline  Pulmonary/Chest:  Normal respiratory rate and rhthym.  No use of accessory muscles.   Abdominal:         Soft. No tenderness.   Musculoskeletal:         Normal range of motion.  No edema or tenderness of lower extremities.    Extremities: No cyanosis, clubbing, or edema present.  Neurological:        Alert and oriented. No cranial nerve deficit. [name]    Psychiatric:        Normal mood and affect.      Labs   Urine dip demonstrates   Results for POC orders placed in visit on 11/06/17   POCT Urinalysis No Micro (Auto)   Result Value Ref Range    Glucose, Ur Negative NEGATIVE mg/dl    Bilirubin Urine Negative     Ketones, Urine Negative NEGATIVE    Specific Gravity, Urine <= 1.005 1.002 - 1.03    Blood, UA POC Negative NEGATIVE    pH, Urine 5.50 5.0 - 9.0    Protein, Urine Negative NEGATIVE mg/dl    Urobilinogen, Urine 0.20 0.0 - 1.0 eu/dl    Nitrite, Urine Negative NEGATIVE    Leukocyte Clumps, Urine Negative NEGATIVE    Color, Urine Yellow YELLOW-STR    Character, Urine Clear [id number].CLO       Patients recent PSA values are as follows  Lab  Results   Component Value Date    PSA 1.33  09/03/2014        Recent BUN/Creatinine:  Lab Results   Component Value Date    BUN 15 10/24/2017    CREATININE 1.0 10/24/2017       Radiology  No imaging reviewed       Assessment & Plan:     Erectile Dysfunction    Urine today was negative for signs of infection  PSA recheck  Flomax nightly to help with his nocturia  Reach out to Cardiology regarding treatment options  Discussed with him limited treatment options d/t Imdur and Nitro use  May need to consider Trimix injections  He wants [place] to reach out to Dr. Gabrielle Dare before discussing Trimix further  RTO in 2 mos for symptom recheck with [facility]    No follow-ups on file.    Jone Baseman, APRN-CNP  Urology

## 2017-11-06 NOTE — Telephone Encounter (Signed)
Please reach out to Dr. Areatha Keas office to see if he is ok with [name] taking Revatio for ED?    Thanks  [employer]

## 2017-11-07 ENCOUNTER — Encounter: Admit: 2017-11-07 | Discharge: 2017-11-07 | Primary: Family

## 2017-11-07 DIAGNOSIS — N401 Enlarged prostate with lower urinary tract symptoms: Secondary | ICD-10-CM

## 2017-11-07 LAB — PSA PROSTATIC SPECIFIC ANTIGEN: PSA: 2.74 ng/mL — ABNORMAL HIGH (ref 0.00–1.00)

## 2017-11-07 NOTE — Telephone Encounter (Signed)
Spoke to [employer] and she said that she will contact the patient and speak with him about this.     Thanks

## 2017-11-08 NOTE — Telephone Encounter (Signed)
The patient called for his PSA results. I advised him they were 2.74 on 11/07/2017. He would also like to know about the revatio. Please advise. Thank you.

## 2017-11-09 ENCOUNTER — Encounter

## 2017-11-09 MED ORDER — DIAZEPAM 5 MG PO TABS
5 MG | ORAL_TABLET | Freq: Three times a day (TID) | ORAL | 0 refills | Status: DC | PRN
Start: 2017-11-09 — End: 2017-12-07

## 2017-11-09 NOTE — Telephone Encounter (Signed)
Micheal Birminghamavid A Justiss called requesting a refill on the following medications:  Requested Prescriptions     Pending Prescriptions Disp Refills   ??? diazepam (VALIUM) 5 MG tablet 90 tablet 0     Sig: Take 1 tablet by mouth every 8 hours as needed for Anxiety for up to 30 days.     Pharmacy [facility]:  [facility] [address]    Date of last visit: 10/24/17  Date of next visit (if applicable): 02/26/2018

## 2017-11-09 NOTE — Telephone Encounter (Signed)
Pt informed.  He will get first immunization and colonoscopy ordered on 11/15/17.

## 2017-11-09 NOTE — Telephone Encounter (Signed)
Micheal [name] said his insurance informed him that he needed to schedule the following:    Shingles Vaccine  Pneumonia Vaccine  Colonoscopy    He is also asking if he can get both vaccines at the same time or not?     Please advise.    DOLV  10/24/17  DONV  02/26/18

## 2017-11-09 NOTE — Telephone Encounter (Signed)
Usually, we separate those 2 vaccines by one month

## 2017-11-09 NOTE — Telephone Encounter (Signed)
Please advise on vaccines.

## 2017-11-10 NOTE — Telephone Encounter (Signed)
I attempted to call the patient regarding the message below. I left a voicemail to return the call to the office.

## 2017-11-10 NOTE — Telephone Encounter (Signed)
Please ley Langdon know that he cannot take the Revatio due to him being on Imdur and Nitro.  Would he be interested in Trimix injections?? If so, please get him scheduled with MD or Logan.      Thanks  [employer]

## 2017-11-13 NOTE — Telephone Encounter (Signed)
The patient voiced understanding he could not take the revatio with imdur and nitro. He will return the call to the office if he decides to do the trimix injections.

## 2017-11-13 NOTE — Telephone Encounter (Signed)
11/01/17 Message on referral from Clyda Greenerhristina Tullis: Patient stopped in and wanted to see the same Dr [place] [facility]. He is making his appointment with this provider since he just saw him last year. Therefore this referral is cancelled.     Scharschmidt, Ihor Austinhomas J, MD??   [address]   [address]??   [place], [place] [id number]??   [phone]??   [phone] (Fax)??

## 2017-11-15 ENCOUNTER — Ambulatory Visit: Admit: 2017-11-15 | Discharge: 2017-11-15 | Payer: MEDICARE | Attending: Family Medicine | Primary: Family

## 2017-11-15 DIAGNOSIS — Z0000 Encounter for general adult medical examination without abnormal findings: Secondary | ICD-10-CM

## 2017-11-15 MED ORDER — OXYCODONE-ACETAMINOPHEN 10-325 MG PO TABS
10-325 MG | ORAL_TABLET | Freq: Three times a day (TID) | ORAL | 0 refills | Status: DC | PRN
Start: 2017-11-15 — End: 2017-12-14

## 2017-11-15 NOTE — Progress Notes (Signed)
Medicare Annual Wellness Visit  Name: ED ANNESS [id number] Date: 11/15/2017   MRN: [id number] Sex: Male   Age: 62 y.o. Ethnicity: Hispanic/Latino   DOB: May 30, 1955 Race: Hispanic      Micheal [name] is here for Annual Exam and Referral - General (GI)    Screenings for behavioral, psychosocial and functional/safety risks, and cognitive dysfunction are all negative except as indicated below. These results, as well as other patient data from the Health Risk Assessment form, are documented in Flowsheets linked to this Encounter.    No Known Allergiesic  Prior to Visit Medications    Medication Sig Taking? Authorizing Provider   oxyCODONE-acetaminophen (PERCOCET) 10-325 MG per tablet Take 1 tablet by mouth every 8 hours as needed for Pain for up to 30 days. Yes [name] Kriss Perleberg, DO   diazepam (VALIUM) 5 MG tablet Take 1 tablet by mouth every 8 hours as needed for Anxiety for up to 30 days. Yes [name], DO   tamsulosin (FLOMAX) 0.4 MG capsule Take 1 capsule by mouth nightly Yes Jone Baseman, APRN - CNP   esomeprazole (NEXIUM) 40 MG delayed release capsule TAKE 1 CAPSULE BY MOUTH ONCE DAILY IN THE MORNING BEFORE BREAKFAST Yes Palyn Scrima D Jian Hodgman, DO   gabapentin (NEURONTIN) 300 MG capsule Take 1 tab PO qhs x 1 week, then take 1 tab PO BID x 1 week, then take 1 tab PO TID. Yes [name], DO   metFORMIN (GLUCOPHAGE) 500 MG tablet Take 1 tablet by mouth 2 times daily (with meals) Yes [name], DO   PARoxetine (PAXIL) 40 MG tablet Take 1 tablet by mouth daily Yes Kaysey Berndt D Nikitia Asbill, DO   fenofibrate (TRICOR) 145 MG tablet TAKE 1 TABLET BY MOUTH DAILY Yes Arvid Marengo D Damire Remedios, DO   glimepiride (AMARYL) 4 MG tablet TAKE 1 TABLET BY MOUTH IN THE MORNING Yes Anaiya Wisinski D Roxsana Riding, DO   montelukast (SINGULAIR) 10 MG tablet Take 1 tablet by mouth daily Yes Consepcion Utt D Mirza Kidney, DO   olopatadine (PATANOL) 0.1 % ophthalmic solution Place 1 drop into both eyes 2 times daily Yes Lavell Ridings D Matson Welch, DO   ipratropium (ATROVENT) 0.03 % nasal spray 2 sprays by Nasal  route 2 times daily Yes Jenavie Stanczak D Jasher Barkan, DO   lisinopril (PRINIVIL;ZESTRIL) 40 MG tablet TAKE 1 TABLET BY MOUTH DAILY Yes Adine Heimann D Katarzyna Wolven, DO   isosorbide mononitrate (IMDUR) 30 MG extended release tablet TAKE 1 TABLET BY MOUTH ONCE DAILY Yes Zoheir A Abdelbaki, MD   atorvastatin (LIPITOR) 80 MG tablet TAKE 1 TABLET BY MOUTH NIGHTLY Yes Zoheir A Abdelbaki, MD   metoprolol succinate (TOPROL XL) 25 MG extended release tablet TAKE 1 TABLET BY MOUTH ONCE DAILY Yes Zoheir A Abdelbaki, MD   BRILINTA 90 MG TABS tablet TAKE 1 TABLET BY MOUTH TWICE DAILY Yes Zoheir A Abdelbaki, MD   nitroGLYCERIN (NITROSTAT) 0.4 MG SL tablet Place 1 tablet under the tongue every 5 minutes as needed for Chest pain Yes [name], DO   aspirin 81 MG chewable tablet Take 1 tablet by mouth daily Yes Michae Kava, APRN - CNP   hydrocortisone 2.5 % cream Apply to external ear BID PRN Yes [name], DO   Handicap Placard MISC by Does not apply route Expires 03/01/2020 Yes [name], DO   Multiple Vitamins-Minerals (CENTRUM SILVER ULTRA MENS) TABS Take 1 tablet by mouth daily Yes Historical Provider, MD   ations    Medication Sig Taking? Authorizing  Provider   oxyCODONE-acetaminophen (PERCOCET) 10-325 MG per tablet Take 1 tablet by mouth every 8 hours as needed for Pain for up to 30 days. Yes [name] Sereniti Wan, DO   diazepam (VALIUM) 5 MG tablet Take 1 tablet by mouth every 8 hours as needed for Anxiety for up to 30 days. Yes [name], DO   tamsulosin (FLOMAX) 0.4 MG capsule Take 1 capsule by mouth nightly Yes Jone Baseman, APRN - CNP   esomeprazole (NEXIUM) 40 MG delayed release capsule TAKE 1 CAPSULE BY MOUTH ONCE DAILY IN THE MORNING BEFORE BREAKFAST Yes Jatavious Peppard D Tehila Sokolow, DO   gabapentin (NEURONTIN) 300 MG capsule Take 1 tab PO qhs x 1 week, then take 1 tab PO BID x 1 week, then take 1 tab PO TID. Yes [name], DO   metFORMIN (GLUCOPHAGE) 500 MG tablet Take 1 tablet by mouth 2 times daily (with meals) Yes [name], DO    PARoxetine (PAXIL) 40 MG tablet Take 1 tablet by mouth daily Yes Noboru Bidinger D Dazja Houchin, DO   fenofibrate (TRICOR) 145 MG tablet TAKE 1 TABLET BY MOUTH DAILY Yes Rihaan Barrack D Thoams Siefert, DO   glimepiride (AMARYL) 4 MG tablet TAKE 1 TABLET BY MOUTH IN THE MORNING Yes Jeanni Allshouse D Christiane Sistare, DO   montelukast (SINGULAIR) 10 MG tablet Take 1 tablet by mouth daily Yes Kavaughn Faucett D Koji Niehoff, DO   olopatadine (PATANOL) 0.1 % ophthalmic solution Place 1 drop into both eyes 2 times daily Yes Kendelle Schweers D Mohamedamin Nifong, DO   ipratropium (ATROVENT) 0.03 % nasal spray 2 sprays by Nasal route 2 times daily Yes Darline Faith D Eleanor Dimichele, DO   lisinopril (PRINIVIL;ZESTRIL) 40 MG tablet TAKE 1 TABLET BY MOUTH DAILY Yes Karson Chicas D Shiraz Bastyr, DO   isosorbide mononitrate (IMDUR) 30 MG extended release tablet TAKE 1 TABLET BY MOUTH ONCE DAILY Yes Zoheir A Abdelbaki, MD   atorvastatin (LIPITOR) 80 MG tablet TAKE 1 TABLET BY MOUTH NIGHTLY Yes Zoheir A Abdelbaki, MD   metoprolol succinate (TOPROL XL) 25 MG extended release tablet TAKE 1 TABLET BY MOUTH ONCE DAILY Yes Zoheir A Abdelbaki, MD   BRILINTA 90 MG TABS tablet TAKE 1 TABLET BY MOUTH TWICE DAILY Yes Zoheir A Abdelbaki, MD   nitroGLYCERIN (NITROSTAT) 0.4 MG SL tablet Place 1 tablet under the tongue every 5 minutes as needed for Chest pain Yes [name], DO   aspirin 81 MG chewable tablet Take 1 tablet by mouth daily Yes Michae Kava, APRN - CNP   hydrocortisone 2.5 % cream Apply to external ear BID PRN Yes [name], DO   Handicap Placard MISC by Does not apply route Expires 03/01/2020 Yes [name], DO   Multiple Vitamins-Minerals (CENTRUM SILVER ULTRA MENS) TABS Take 1 tablet by mouth daily Yes Historical Provider, MD      Diagnosis Date   . Arthritis     general   . CAD (coronary artery disease)     s/p stent placement   . Diabetes mellitus (HCC)    . GERD (gastroesophageal reflux disease)    . Hyperlipidemia    . Hypertension      Past Surgical History:   Procedure Laterality Date   . CORONARY ARTERY BYPASS GRAFT     . DIAGNOSTIC  CARDIAC CATH LAB PROCEDURE     . ORTHOPEDIC SURGERY Left    . PTCA  07/04/2016       Family History   Problem Relation Age of Onset   . Cancer  Mother    . Heart Disease Brother    . Stroke Brother    . Diabetes Brother    . Early Death Brother    . High Blood Pressure Brother    . High Cholesterol Brother    . Mental Retardation Sister        CareTeam (Including outside providers/suppliers regularly involved in providing care):   Patient Care Team:  [name], DO as PCP - General (Family Medicine)  [name], DO as PCP - [facility] Empaneled Provider    Wt Readings from Last 3 Encounters:   11/15/17 211 lb (95.7 kg)   11/06/17 212 lb (96.2 kg)   11/01/17 219 lb (99.3 kg)     Vitals:    11/15/17 0946   BP: 137/77   Pulse: 104   Resp: 14   Temp: 98.2 F (36.8 C)   TempSrc: Oral   Weight: 211 lb (95.7 kg)   Height: 5\' 7"  (1.702 m)     Body mass index is 33.05 kg/m.    Based upon direct observation of the patient, evaluation of cognition reveals recent and remote memory intact.    General Appearance: alert and oriented to person, place and time, well-developed and well-nourished, in no acute distress  Pulmonary/Chest: clear to auscultation bilaterally- no wheezes, rales or rhonchi, normal air movement, no respiratory distress  Cardiovascular: normal rate, normal S1 and S2, no gallops, intact distal pulses and no carotid bruits  Extremities: no cyanosis and no clubbing    Patient's complete Health Risk Assessment and screening values have been reviewed and are found in Flowsheets. The following problems were reviewed today and where indicated follow up appointments were made and/or referrals ordered.    Positive Risk Factor Screenings with Interventions:     Health Habits/Nutrition:  Health Habits/Nutrition  Do you exercise for at least 20 minutes 2-3 times per week?: Yes  Have you lost any weight without trying in the past 3 months?: No  Do you eat fewer than 2 meals per day?: No  Have you seen a dentist within the  past year?: (!) No  Body mass index is 33.05 kg/m.  Health Habits/Nutrition Interventions:   Dental exam overdue:  patient encouraged to make appointment with his/her dentist    Personalized Preventive Plan   Current Health Maintenance Status  Immunization History   Administered Date(s) Administered   . Influenza Vaccine, unspecified formulation 09/25/2014   . Influenza, Quadv, IM, PF (6 mo and older Fluzone, Flulaval, Fluarix, and 3 yrs and older Afluria) 01/13/2016, 11/10/2016, 10/24/2017   . Pneumococcal Conjugate 13-valent (Prevnar13) 11/15/2017   . Pneumococcal Polysaccharide (Pneumovax23) 07/24/2013   . Zoster Recombinant (Shingrix) 11/15/2017        Health Maintenance   Topic Date Due   . Diabetic retinal exam  06/11/1964   . HIV screen  06/11/1969   . DTaP/Tdap/Td vaccine (1 - Tdap) 06/11/1973   . Low dose CT lung screening  06/11/2009   . Diabetic foot exam  07/14/2016   . Annual Wellness Visit (AWV)  06/21/2017   . Shingles Vaccine (2 of 2) 01/10/2018   . Diabetic microalbuminuria test  04/28/2018   . A1C test (Diabetic or Prediabetic)  10/25/2018   . Lipid screen  10/25/2018   . Potassium monitoring  10/25/2018   . Creatinine monitoring  10/25/2018   . Colon cancer screen colonoscopy  07/25/2023   . Flu vaccine  Completed   . Pneumococcal 0-64 years Vaccine  Completed   . Hepatitis C screen  Completed     Recommendations for Preventive Services Due: see orders and patient instructions/AVS.  .  Recommended screening schedule for the next 5-10 years is provided to the patient in written form: see Patient Instructions/AVS.    Micheal [name] was seen today for annual exam and referral - general.    Diagnoses and all orders for this visit:    Routine general medical examination at a health care facility    Colon cancer screening  -     [place] Joseph Art, MD, General Surgery, [place]    Need for pneumococcal vaccination  -     Pneumococcal conjugate vaccine 13-valent    Need for shingles vaccine  -     Zoster  recombinant [facility])    Chronic bilateral low back pain with bilateral sciatica  -     oxyCODONE-acetaminophen (PERCOCET) 10-325 MG per tablet; Take 1 tablet by mouth every 8 hours as needed for Pain for up to 30 days.

## 2017-11-15 NOTE — Progress Notes (Signed)
Immunization(s) given during visit:    Immunizations Administered     Name Date Dose Route    Pneumococcal Conjugate 13-valent (Prevnar13) 11/15/2017 0.5 mL Intramuscular    Site: Deltoid- Right    Lot: [id number]    NDC: 1914-7829-56    Zoster Recombinant (Shingrix) 11/15/2017 0.5 mL Intramuscular    Site: Deltoid- Left    Lot: [id number]    NDC: 21308-657-84          Most recent Vaccine Information Sheet dated 03/07/2016,09/07/17 given to pt

## 2017-11-15 NOTE — Patient Instructions (Signed)
Personalized Preventive Plan for Micheal [name] - 11/15/2017  Medicare offers a range of preventive health benefits. Some of the tests and screenings are paid in full while other may be subject to a deductible, co-insurance, and/or copay.    Some of these benefits include a comprehensive review of your medical history including lifestyle, illnesses that may run in your family, and various assessments and screenings as appropriate.    After reviewing your medical record and screening and assessments performed today your provider may have ordered immunizations, labs, imaging, and/or referrals for you.  A list of these orders (if applicable) as well as your Preventive Care list are included within your After Visit Summary for your review.    Other Preventive Recommendations:    ?? A preventive eye exam performed by an eye specialist is recommended every 1-2 years to screen for glaucoma; cataracts, macular degeneration, and other eye disorders.  ?? A preventive dental visit is recommended every 6 months.  ?? Try to get at least 150 minutes of exercise per week or 10,000 steps per day on a pedometer .  ?? Order or download the FREE "Exercise & Physical Activity: Your Everyday Guide" from [employer]. Call [phone] or search [employer] online.  ?? You need 1200-1500 mg of calcium and 1000-2000 IU of vitamin D per day. It is possible to meet your calcium requirement with diet alone, but a vitamin D supplement is usually necessary to meet this goal.  ?? When exposed to the sun, use a sunscreen that protects against both UVA and UVB radiation with an SPF of 30 or greater. Reapply every 2 to 3 hours or after sweating, drying off with a towel, or swimming.  ?? Always wear a seat belt when traveling in a car. Always wear a helmet when riding a bicycle or motorcycle.

## 2017-11-28 NOTE — Progress Notes (Signed)
Micheal Buff MD FACS  General Surgery  New Patient Evaluation in Office  Pt Name: Micheal [name]  Date of Birth 08-Jan-1956   Today's Date: 11/29/2017  Medical Record Number: [id number]  Referring Provider: [name], DO  Primary Care Provider: [name], DO  Chief Complaint   Patient presents with   . Surgical Consult     New patient-referred by Dr Kahle-screening colonoscopy     ASSESSMENT      Problem List Items Addressed This Visit     History of coronary artery stent placement    Essential hypertension    Type 2 diabetes mellitus without complication, without long-term current use of insulin (HCC) - Primary    Hx of CABG    Encounter for screening colonoscopy    Relevant Orders    COLONOSCOPY W/ OR W/O BIOPSY        Past Medical History:   Diagnosis Date   . Arthritis     general   . CAD (coronary artery disease)     s/p stent placement-sees Dr. Gabrielle Dare   . Chronic back pain    . Diabetes mellitus (HCC)    . GERD (gastroesophageal reflux disease)    . Hyperlipidemia    . Hypertension        [employer] recommends screening for average risk patient starting at age 79 which could be any of the following   1.  Yearly fit test   2.  Every 3 years cologuard test   3   Every 10 years screening colonoscopy       PLANS      1. Schedule Micheal [name] for colonoscopy with possible biopsy/polypectomy  2. Potential diagnostic/therapeutic options and alternatives were discussed with the patient in the office. Potential risks of bleeding, infection, perforation and missed lesions was reviewed. Potential for biopsy and/or polypectomy was discussed. We also discussed the use of IV sedation and colon preparation instructions. The patient was given an opportunity to ask questions. Once answered, the patient was agreeable to proceed with the procedure.  3. Status: Outpatient in endoscopy  4. Planned anesthesia: IV sedation provided by anesthesia  5.   Perioperative discontinuation of             ASA, Plavix, warfarin,  Brillinta, Effient, Pradaxa, Eliquis and Xarelto.       Continuation of 81 mg Aspirin is acceptable.  6.   Perioperative bowel preparation with Miralax and Dulcolax. Instructions given and questions answered.  7. There is a clearance 11/01/2017 Dr Gabrielle Dare which states ok to stop antiplatelets short term  Orders Placed This Encounter:  Orders Placed This Encounter   Procedures   . COLONOSCOPY W/ OR W/O BIOPSY     Standing Status:   Future     Standing Expiration Date:   11/28/2018     Order Specific Question:   Screening or Diagnostic?     Answer:   Screening        SUBJECTIVE      Micheal [name] is a 62 y.o. male seen regarding colonoscopy. He is over the age of 4 and had a colonoscopy.Had 1 in [place] 1 /2015 4 years ago and had 3 polyps removed and was supposed to have another in 3  Years.  Pathology was reviewed from his old report which revealed tubular adenoma in 2 and the third 1 was just an inflammatory polyp.  So he only had 2 small 5 mm tubular adenomas at his previous  colonoscopy He denies any history of  previous colon cancer, family history of colon cancer, rectal bleeding, melena, iron deficiency anemia, chronic abdominal pain, change in bowel habits, unexplained weight loss.  Past Medical History  Past Medical History:   Diagnosis Date   . Arthritis     general   . CAD (coronary artery disease)     s/p stent placement-sees Dr. Gabrielle Dare   . Chronic back pain    . Diabetes mellitus (HCC)    . GERD (gastroesophageal reflux disease)    . Hyperlipidemia    . Hypertension        Past Surgical History  Past Surgical History:   Procedure Laterality Date   . COLONOSCOPY  2015    [place]   . CORONARY ARTERY BYPASS GRAFT  2009    6 [place]   . DIAGNOSTIC CARDIAC CATH LAB PROCEDURE     . ORTHOPEDIC SURGERY Left    . PTCA  07/04/2016   . TONSILLECTOMY         Medications  Current Outpatient Medications on File Prior to Visit   Medication Sig Dispense Refill   . oxyCODONE-acetaminophen  (PERCOCET) 10-325 MG per tablet Take 1 tablet by mouth every 8 hours as needed for Pain for up to 30 days. 90 tablet 0   . diazepam (VALIUM) 5 MG tablet Take 1 tablet by mouth every 8 hours as needed for Anxiety for up to 30 days. 90 tablet 0   . tamsulosin (FLOMAX) 0.4 MG capsule Take 1 capsule by mouth nightly 90 capsule 3   . esomeprazole (NEXIUM) 40 MG delayed release capsule TAKE 1 CAPSULE BY MOUTH ONCE DAILY IN THE MORNING BEFORE BREAKFAST 90 capsule 3   . gabapentin (NEURONTIN) 300 MG capsule Take 1 tab PO qhs x 1 week, then take 1 tab PO BID x 1 week, then take 1 tab PO TID. 90 capsule 5   . metFORMIN (GLUCOPHAGE) 500 MG tablet Take 1 tablet by mouth 2 times daily (with meals) 180 tablet 1   . PARoxetine (PAXIL) 40 MG tablet Take 1 tablet by mouth daily 90 tablet 3   . fenofibrate (TRICOR) 145 MG tablet TAKE 1 TABLET BY MOUTH DAILY 90 tablet 3   . glimepiride (AMARYL) 4 MG tablet TAKE 1 TABLET BY MOUTH IN THE MORNING 90 tablet 1   . montelukast (SINGULAIR) 10 MG tablet Take 1 tablet by mouth daily 90 tablet 3   . olopatadine (PATANOL) 0.1 % ophthalmic solution Place 1 drop into both eyes 2 times daily 1 Bottle 5   . ipratropium (ATROVENT) 0.03 % nasal spray 2 sprays by Nasal route 2 times daily 1 Bottle 5   . lisinopril (PRINIVIL;ZESTRIL) 40 MG tablet TAKE 1 TABLET BY MOUTH DAILY 90 tablet 3   . isosorbide mononitrate (IMDUR) 30 MG extended release tablet TAKE 1 TABLET BY MOUTH ONCE DAILY 90 tablet 3   . atorvastatin (LIPITOR) 80 MG tablet TAKE 1 TABLET BY MOUTH NIGHTLY 90 tablet 3   . metoprolol succinate (TOPROL XL) 25 MG extended release tablet TAKE 1 TABLET BY MOUTH ONCE DAILY 90 tablet 3   . BRILINTA 90 MG TABS tablet TAKE 1 TABLET BY MOUTH TWICE DAILY 180 tablet 3   . aspirin 81 MG chewable tablet Take 1 tablet by mouth daily 30 tablet 0   . Handicap Placard MISC by Does not apply route Expires 03/01/2020 1 each 0   . Multiple Vitamins-Minerals (CENTRUM SILVER ULTRA MENS) TABS  Take 1 tablet by mouth  daily     . nitroGLYCERIN (NITROSTAT) 0.4 MG SL tablet Place 1 tablet under the tongue every 5 minutes as needed for Chest pain (Patient not taking: Reported on 11/29/2017) 25 tablet 2   . hydrocortisone 2.5 % cream Apply to external ear BID PRN (Patient not taking: Reported on 11/29/2017) 1 Tube 2     No current facility-administered medications on file prior to visit.      Allergies  No Known Allergies    Family History  Family History   Problem Relation Age of Onset   . Cancer Mother    . Heart Disease Brother    . Stroke Brother    . Diabetes Brother    . Early Death Brother    . High Blood Pressure Brother    . High Cholesterol Brother    . Mental Retardation Sister        Social History  Social History     Socioeconomic History   . Marital status: Married     Spouse name: Not on file   . Number of children: Not on file   . Years of education: Not on file   . Highest education level: Not on file   Occupational History   . Not on file   Social Needs   . Financial resource strain: Not on file   . Food insecurity:     Worry: Not on file     Inability: Not on file   . Transportation needs:     Medical: Not on file     Non-medical: Not on file   Tobacco Use   . Smoking status: Former Smoker     Packs/day: 1.00     Years: 35.00     Pack years: 35.00     Types: Cigarettes     Last attempt to quit: 05/24/2008     Years since quitting: 9.5   . Smokeless tobacco: Never Used   Substance and Sexual Activity   . Alcohol use: Yes     Alcohol/week: 1.0 standard drinks     Types: 1 Glasses of wine per week     Comment: once monthly    . Drug use: No   . Sexual activity: Yes     Partners: Female   Lifestyle   . Physical activity:     Days per week: Not on file     Minutes per session: Not on file   . Stress: Not on file   Relationships   . Social connections:     Talks on phone: Not on file     Gets together: Not on file     Attends religious service: Not on file     Active member of club or organization: Not on file     Attends  meetings of clubs or organizations: Not on file     Relationship status: Not on file   . Intimate partner violence:     Fear of current or ex partner: Not on file     Emotionally abused: Not on file     Physically abused: Not on file     Forced sexual activity: Not on file   Other Topics Concern   . Not on file   Social History Narrative   . Not on file      Health Screening Exams  Health Maintenance   Topic Date Due   . Diabetic retinal exam  06/11/1964   .  HIV screen  06/11/1969   . DTaP/Tdap/Td vaccine (1 - Tdap) 06/11/1973   . Low dose CT lung screening  06/11/2009   . Diabetic foot exam  07/14/2016   . Shingles Vaccine (2 of 2) 01/10/2018   . Diabetic microalbuminuria test  04/28/2018   . A1C test (Diabetic or Prediabetic)  10/25/2018   . Lipid screen  10/25/2018   . Potassium monitoring  10/25/2018   . Creatinine monitoring  10/25/2018   . Annual Wellness Visit (AWV)  11/15/2018   . Colon cancer screen colonoscopy  07/25/2023   . Flu vaccine  Completed   . Pneumococcal 0-64 years Vaccine  Completed   . Hepatitis C screen  Completed       Review of Systems  Constitutional: Negative for activity change, appetite change, chills, diaphoresis, fatigue, fever and unexpected weight change.   HENT: Negative for congestion, dental problem, drooling, ear discharge, ear pain, facial swelling, hearing loss, mouth sores, nosebleeds, postnasal drip, rhinorrhea, sinus pressure, sinus pain, sneezing, sore throat, tinnitus, trouble swallowing and voice change.    Eyes: Negative for photophobia, pain, discharge, redness, itching and visual disturbance.   Respiratory: Negative for apnea, cough, choking, chest tightness, shortness of breath, wheezing and stridor.    Cardiovascular: Negative for chest pain, palpitations and leg swelling.   Gastrointestinal: Negative for abdominal distention, abdominal pain, anal bleeding, blood in stool, constipation, diarrhea, nausea, rectal pain and vomiting.   Genitourinary: Negative for  decreased urine volume, difficulty urinating, discharge, dysuria, enuresis, flank pain, frequency, genital sores, hematuria, penile pain, penile swelling, scrotal swelling, testicular pain and urgency.   Musculoskeletal: Positive for back pain. Negative for arthralgias, gait problem, joint swelling, myalgias, neck pain and neck stiffness.   Skin: Negative for color change, pallor, rash and wound.   Neurological: Negative for dizziness, tremors, seizures, syncope, facial asymmetry, speech difficulty, weakness, light-headedness, numbness and headaches.   Hematological: Negative for adenopathy. Bruises/bleeds easily.   Psychiatric/Behavioral: Negative for agitation, behavioral problems, confusion, decreased concentration, dysphoric mood, hallucinations, self-injury, sleep disturbance and suicidal ideas. The patient is not nervous/anxious and is not hyperactive    OBJECTIVE    VITALS:  height is 5\' 7"  (1.702 m) and weight is 209 lb (94.8 kg). His tympanic temperature is 96.5 F (35.8 C). His blood pressure is 112/68 and his pulse is 80. His respiration is 20 and oxygen saturation is 99%.   CONSTITUTIONAL: Alert and oriented times 3, no acute distress and cooperative to examination with proper mood and affect.  SKIN: Skin color, texture, turgor normal. No rashes or lesions.  LYMPH: no cervical nodes, no inguinal nodes  HEENT: Head is normocephalic, atraumatic. EOMI, PERRLA.  NECK: Supple, symmetrical, trachea midline, no adenopathy, thyroid symmetric, not enlarged and no tenderness, skin normal.  CHEST/LUNGS: chest symmetric with normal A/P diameter, normal respiratory rate and rhythm, lungs clear to auscultation without wheezes, rales or rhonchi. No accessory muscle use. Scars Median sternotomy   CARDIOVASCULAR: Heart sounds are normal.  Regular rate and rhythm without murmur, gallop or rub. Normal S1 and S2.. Carotid and femoral pulses 2+/4 and equal bilaterally.  ABDOMEN: Normal shape. No scar(s) present. Normal  bowel sounds.  No bruits. . "soft, nontender, nondistended, no masses or organomegaly. no evidence of hernia. Percussion: Normal without hepatosplenomegally. Tenderness: absent.  RECTAL: deferred, not clinically indicated  NEUROLOGIC: There are no focalizing motor or sensory deficits. CN II-XII are grossly intact.[name]   EXTREMITIES: no cyanosis, no clubbing and no edema.  Electronically signed by Micheal [name] on 11/29/2017 at 10:21 AM

## 2017-11-29 ENCOUNTER — Ambulatory Visit: Admit: 2017-11-29 | Discharge: 2017-11-29 | Payer: MEDICARE | Attending: Surgery | Primary: Family

## 2017-11-29 DIAGNOSIS — E119 Type 2 diabetes mellitus without complications: Secondary | ICD-10-CM

## 2017-11-29 NOTE — Telephone Encounter (Signed)
Scheduled Micheal [name] for a colonoscopy with anesthesia on Tues 11/26 at 8:15 & he will arrive at 7am. The prep instructions were given & explained. Metformin will be stopped 2 days prior.

## 2017-11-29 NOTE — Progress Notes (Signed)
Subjective:      Patient ID: Micheal [name] is a 62 y.o. male.    HPI    Review of Systems   Constitutional: Negative for activity change, appetite change, chills, diaphoresis, fatigue, fever and unexpected weight change.   HENT: Negative for congestion, dental problem, drooling, ear discharge, ear pain, facial swelling, hearing loss, mouth sores, nosebleeds, postnasal drip, rhinorrhea, sinus pressure, sinus pain, sneezing, sore throat, tinnitus, trouble swallowing and voice change.    Eyes: Negative for photophobia, pain, discharge, redness, itching and visual disturbance.   Respiratory: Negative for apnea, cough, choking, chest tightness, shortness of breath, wheezing and stridor.    Cardiovascular: Negative for chest pain, palpitations and leg swelling.   Gastrointestinal: Negative for abdominal distention, abdominal pain, anal bleeding, blood in stool, constipation, diarrhea, nausea, rectal pain and vomiting.   Genitourinary: Negative for decreased urine volume, difficulty urinating, discharge, dysuria, enuresis, flank pain, frequency, genital sores, hematuria, penile pain, penile swelling, scrotal swelling, testicular pain and urgency.   Musculoskeletal: Positive for back pain. Negative for arthralgias, gait problem, joint swelling, myalgias, neck pain and neck stiffness.   Skin: Negative for color change, pallor, rash and wound.   Neurological: Negative for dizziness, tremors, seizures, syncope, facial asymmetry, speech difficulty, weakness, light-headedness, numbness and headaches.   Hematological: Negative for adenopathy. Bruises/bleeds easily.   Psychiatric/Behavioral: Negative for agitation, behavioral problems, confusion, decreased concentration, dysphoric mood, hallucinations, self-injury, sleep disturbance and suicidal ideas. The patient is not nervous/anxious and is not hyperactive.        Objective:   Physical Exam    Assessment:            Plan:              Ihor Gully, RN

## 2017-12-07 ENCOUNTER — Encounter

## 2017-12-07 MED ORDER — DIAZEPAM 5 MG PO TABS
5 MG | ORAL_TABLET | Freq: Three times a day (TID) | ORAL | 0 refills | Status: DC | PRN
Start: 2017-12-07 — End: 2018-01-03

## 2017-12-07 NOTE — Telephone Encounter (Signed)
Micheal [name] called requesting a refill on the following medications:  Requested Prescriptions     Pending Prescriptions Disp Refills   ??? diazepam (VALIUM) 5 MG tablet 90 tablet 0     Sig: Take 1 tablet by mouth every 8 hours as needed for Anxiety for up to 30 days.     [facility] [place]  [facility]      Date of last visit: 11/15/2017  Date of next visit (if applicable): 02/26/2018

## 2017-12-13 NOTE — Telephone Encounter (Signed)
[facility] [facility] & cpt codes [phone] do not require pre-cert for outpatient & if in network. The ref # for the call is [id number].

## 2017-12-14 ENCOUNTER — Encounter

## 2017-12-14 MED ORDER — OXYCODONE-ACETAMINOPHEN 10-325 MG PO TABS
10-325 MG | ORAL_TABLET | Freq: Three times a day (TID) | ORAL | 0 refills | Status: DC | PRN
Start: 2017-12-14 — End: 2018-01-11

## 2017-12-14 NOTE — Telephone Encounter (Signed)
Micheal Birminghamavid A Orlov called requesting a refill on the following medications:  Requested Prescriptions     Pending Prescriptions Disp Refills   ??? oxyCODONE-acetaminophen (PERCOCET) 10-325 MG per tablet 90 tablet 0     Sig: Take 1 tablet by mouth every 8 hours as needed for Pain for up to 30 days.     [facility] [place]  [facility]      Date of last visit: 11/15/2017   Date of next visit (if applicable): 02/26/2018

## 2017-12-18 NOTE — H&P (Signed)
Florian Buff MD FACS  General Surgery  H and P for Surgery    Pt Name: Micheal [name]  Date of Birth 21-May-1955   Today's Date: 11/29/2017  Medical Record Number: [id number]  Referring Provider: [name], DO  Primary Care Provider: [name], DO  Chief Complaint   Patient presents with   . Surgical Consult       New patient-referred by Dr Kahle-screening colonoscopy      ASSESSMENT        Problem List Items Addressed This Visit           History of coronary artery stent placement      Essential hypertension      Type 2 diabetes mellitus without complication, without long-term current use of insulin (HCC) - Primary      Hx of CABG      Encounter for screening colonoscopy      Relevant Orders      COLONOSCOPY W/ OR W/O BIOPSY           Past Medical History        Past Medical History:   Diagnosis Date   . Arthritis       general   . CAD (coronary artery disease)       s/p stent placement-sees Dr. Gabrielle Dare   . Chronic back pain     . Diabetes mellitus (HCC)     . GERD (gastroesophageal reflux disease)     . Hyperlipidemia     . Hypertension            American [employer] Society recommends screening for average risk patient starting at age 5 which could be any of the following              1.  Yearly fit test              2.  Every 3 years cologuard test              3   Every 10 years screening colonoscopy     PLANS   1. Schedule [facility] for colonoscopy with possible biopsy/polypectomy  2. Potential diagnostic/therapeutic options and alternatives were discussed with the patient in the office. Potential risks of bleeding, infection, perforation and missed lesions was reviewed. Potential for biopsy and/or polypectomy was discussed. We also discussed the use of IV sedation and colon preparation instructions. The patient was given an opportunity to ask questions. Once answered, the patient was agreeable to proceed with the procedure.  3. Status: Outpatient in endoscopy  4. Planned anesthesia: IV sedation provided by  anesthesia  5.   Perioperative discontinuation of             ASA, Plavix, warfarin, Brillinta, Effient, Pradaxa, Eliquis and Xarelto.       Continuation of 81 mg Aspirin is acceptable.  6.   Perioperative bowel preparation with Miralax and Dulcolax. Instructions given and questions answered.  7. There is a clearance 11/01/2017 Dr Gabrielle Dare which states ok to stop antiplatelets short term  Orders Placed This Encounter:        Orders Placed This Encounter   Procedures   . COLONOSCOPY W/ OR W/O BIOPSY       Standing Status:   Future       Standing Expiration Date:   11/28/2018       Order Specific Question:   Screening or Diagnostic?       Answer:   Screening  SUBJECTIVE   Micheal [name] is a 62 y.o. male seen regarding colonoscopy. He is over the age of 49 and had a colonoscopy.Had 1 in [place] 1 /2015 4 years ago and had 3 polyps removed and was supposed to have another in 3  Years.  Pathology was reviewed from his old report which revealed tubular adenoma in 2 and the third 1 was just an inflammatory polyp.  So he only had 2 small 5 mm tubular adenomas at his previous colonoscopy He denies any history of  previous colon cancer, family history of colon cancer, rectal bleeding, melena, iron deficiency anemia, chronic abdominal pain, change in bowel habits, unexplained weight loss.  Past Medical History  Past Medical History        Past Medical History:   Diagnosis Date   . Arthritis       general   . CAD (coronary artery disease)       s/p stent placement-sees Dr. Gabrielle Dare   . Chronic back pain     . Diabetes mellitus (HCC)     . GERD (gastroesophageal reflux disease)     . Hyperlipidemia     . Hypertension            Past Surgical History  Past Surgical History         Past Surgical History:   Procedure Laterality Date   . COLONOSCOPY   2015     [place] [employer] [place]   . CORONARY ARTERY BYPASS GRAFT   2009     6 vessel-[place]   . DIAGNOSTIC CARDIAC CATH LAB PROCEDURE       . ORTHOPEDIC SURGERY Left     . PTCA    07/04/2016   . TONSILLECTOMY              Medications         Current Outpatient Medications on File Prior to Visit   Medication Sig Dispense Refill   . oxyCODONE-acetaminophen (PERCOCET) 10-325 MG per tablet Take 1 tablet by mouth every 8 hours as needed for Pain for up to 30 days. 90 tablet 0   . diazepam (VALIUM) 5 MG tablet Take 1 tablet by mouth every 8 hours as needed for Anxiety for up to 30 days. 90 tablet 0   . tamsulosin (FLOMAX) 0.4 MG capsule Take 1 capsule by mouth nightly 90 capsule 3   . esomeprazole (NEXIUM) 40 MG delayed release capsule TAKE 1 CAPSULE BY MOUTH ONCE DAILY IN THE MORNING BEFORE BREAKFAST 90 capsule 3   . gabapentin (NEURONTIN) 300 MG capsule Take 1 tab PO qhs x 1 week, then take 1 tab PO BID x 1 week, then take 1 tab PO TID. 90 capsule 5   . metFORMIN (GLUCOPHAGE) 500 MG tablet Take 1 tablet by mouth 2 times daily (with meals) 180 tablet 1   . PARoxetine (PAXIL) 40 MG tablet Take 1 tablet by mouth daily 90 tablet 3   . fenofibrate (TRICOR) 145 MG tablet TAKE 1 TABLET BY MOUTH DAILY 90 tablet 3   . glimepiride (AMARYL) 4 MG tablet TAKE 1 TABLET BY MOUTH IN THE MORNING 90 tablet 1   . montelukast (SINGULAIR) 10 MG tablet Take 1 tablet by mouth daily 90 tablet 3   . olopatadine (PATANOL) 0.1 % ophthalmic solution Place 1 drop into both eyes 2 times daily 1 Bottle 5   . ipratropium (ATROVENT) 0.03 % nasal spray 2 sprays by Nasal route 2 times daily 1 Bottle 5   .  lisinopril (PRINIVIL;ZESTRIL) 40 MG tablet TAKE 1 TABLET BY MOUTH DAILY 90 tablet 3   . isosorbide mononitrate (IMDUR) 30 MG extended release tablet TAKE 1 TABLET BY MOUTH ONCE DAILY 90 tablet 3   . atorvastatin (LIPITOR) 80 MG tablet TAKE 1 TABLET BY MOUTH NIGHTLY 90 tablet 3   . metoprolol succinate (TOPROL XL) 25 MG extended release tablet TAKE 1 TABLET BY MOUTH ONCE DAILY 90 tablet 3   . BRILINTA 90 MG TABS tablet TAKE 1 TABLET BY MOUTH TWICE DAILY 180 tablet 3   . aspirin 81 MG chewable tablet Take 1 tablet by mouth daily 30  tablet 0   . Handicap Placard MISC by Does not apply route Expires 03/01/2020 1 each 0   . Multiple Vitamins-Minerals (CENTRUM SILVER ULTRA MENS) TABS Take 1 tablet by mouth daily       . nitroGLYCERIN (NITROSTAT) 0.4 MG SL tablet Place 1 tablet under the tongue every 5 minutes as needed for Chest pain (Patient not taking: Reported on 11/29/2017) 25 tablet 2   . hydrocortisone 2.5 % cream Apply to external ear BID PRN (Patient not taking: Reported on 11/29/2017) 1 Tube 2      No current facility-administered medications on file prior to visit.       Allergies  No Known Allergies    Family History  Family History         Family History   Problem Relation Age of Onset   . Cancer Mother     . Heart Disease Brother     . Stroke Brother     . Diabetes Brother     . Early Death Brother     . High Blood Pressure Brother     . High Cholesterol Brother     . Mental Retardation Sister            Social History  Social History               Socioeconomic History   . Marital status: Married       Spouse name: Not on file   . Number of children: Not on file   . Years of education: Not on file   . Highest education level: Not on file   Occupational History   . Not on file   Social Needs   . Financial resource strain: Not on file   . Food insecurity:       Worry: Not on file       Inability: Not on file   . Transportation needs:       Medical: Not on file       Non-medical: Not on file   Tobacco Use   . Smoking status: Former Smoker       Packs/day: 1.00       Years: 35.00       Pack years: 35.00       Types: Cigarettes       Last attempt to quit: 05/24/2008       Years since quitting: 9.5   . Smokeless tobacco: Never Used   Substance and Sexual Activity   . Alcohol use: Yes       Alcohol/week: 1.0 standard drinks       Types: 1 Glasses of wine per week       Comment: once monthly    . Drug use: No   . Sexual activity: Yes       Partners: Female   Lifestyle   .  Physical activity:       Days per week: Not on file       Minutes per  session: Not on file   . Stress: Not on file   Relationships   . Social connections:       Talks on phone: Not on file       Gets together: Not on file       Attends religious service: Not on file       Active member of club or organization: Not on file       Attends meetings of clubs or organizations: Not on file       Relationship status: Not on file   . Intimate partner violence:       Fear of current or ex partner: Not on file       Emotionally abused: Not on file       Physically abused: Not on file       Forced sexual activity: Not on file   Other Topics Concern   . Not on file   Social History Narrative   . Not on file          Health Screening Exams       Health Maintenance   Topic Date Due   . Diabetic retinal exam  06/11/1964   . HIV screen  06/11/1969   . DTaP/Tdap/Td vaccine (1 - Tdap) 06/11/1973   . Low dose CT lung screening  06/11/2009   . Diabetic foot exam  07/14/2016   . Shingles Vaccine (2 of 2) 01/10/2018   . Diabetic microalbuminuria test  04/28/2018   . A1C test (Diabetic or Prediabetic)  10/25/2018   . Lipid screen  10/25/2018   . Potassium monitoring  10/25/2018   . Creatinine monitoring  10/25/2018   . Annual Wellness Visit (AWV)  11/15/2018   . Colon cancer screen colonoscopy  07/25/2023   . Flu vaccine  Completed   . Pneumococcal 0-64 years Vaccine  Completed   . Hepatitis C screen  Completed       Review of Systems  Constitutional: Negative for activity change, appetite change, chills, diaphoresis, fatigue, fever and unexpected weight change.   HENT: Negative for congestion, dental problem, drooling, ear discharge, ear pain, facial swelling, hearing loss, mouth sores, nosebleeds, postnasal drip, rhinorrhea, sinus pressure, sinus pain, sneezing, sore throat, tinnitus, trouble swallowing and voice change.    Eyes: Negative for photophobia, pain, discharge, redness, itching and visual disturbance.   Respiratory: Negative for apnea, cough, choking, chest tightness, shortness of breath,  wheezing and stridor.    Cardiovascular: Negative for chest pain, palpitations and leg swelling.   Gastrointestinal: Negative for abdominal distention, abdominal pain, anal bleeding, blood in stool, constipation, diarrhea, nausea, rectal pain and vomiting.   Genitourinary: Negative for decreased urine volume, difficulty urinating, discharge, dysuria, enuresis, flank pain, frequency, genital sores, hematuria, penile pain, penile swelling, scrotal swelling, testicular pain and urgency.   Musculoskeletal: Positive for back pain. Negative for arthralgias, gait problem, joint swelling, myalgias, neck pain and neck stiffness.   Skin: Negative for color change, pallor, rash and wound.   Neurological: Negative for dizziness, tremors, seizures, syncope, facial asymmetry, speech difficulty, weakness, light-headedness, numbness and headaches.   Hematological: Negative for adenopathy. Bruises/bleeds easily.   Psychiatric/Behavioral: Negative for agitation, behavioral problems, confusion, decreased concentration, dysphoric mood, hallucinations, self-injury, sleep disturbance and suicidal ideas. The patient is not nervous/anxious and is not hyperactive    OBJECTIVE    VITALS:  height is 5\' 7"  (1.702 m) and weight is 209 lb (94.8 kg). His tympanic temperature is 96.5 F (35.8 C). His blood pressure is 112/68 and his pulse is 80. His respiration is 20 and oxygen saturation is 99%.   CONSTITUTIONAL: Alert and oriented times 3, no acute distress and cooperative to examination with proper mood and affect.  SKIN: Skin color, texture, turgor normal. No rashes or lesions.  LYMPH: no cervical nodes, no inguinal nodes  HEENT: Head is normocephalic, atraumatic. EOMI, PERRLA.  NECK: Supple, symmetrical, trachea midline, no adenopathy, thyroid symmetric, not enlarged and no tenderness, skin normal.  CHEST/LUNGS: chest symmetric with normal A/P diameter, normal respiratory rate and rhythm, lungs clear to auscultation without wheezes, rales or  rhonchi. No accessory muscle use. Scars Median sternotomy   CARDIOVASCULAR: Heart sounds are normal.  Regular rate and rhythm without murmur, gallop or rub. Normal S1 and S2.. Carotid and femoral pulses 2+/4 and equal bilaterally.  ABDOMEN: Normal shape. No scar(s) present. Normal bowel sounds.  No bruits. . "soft, nontender, nondistended, no masses or organomegaly. no evidence of hernia. Percussion: Normal without hepatosplenomegally. Tenderness: absent.  RECTAL: deferred, not clinically indicated  NEUROLOGIC: There are no focalizing motor or sensory deficits. CN II-XII are grossly intact.[name]   EXTREMITIES: no cyanosis, no clubbing and no edema.                  Electronically signed by Florian Buff on 11/29/2017 at 10:21 AM

## 2017-12-19 ENCOUNTER — Inpatient Hospital Stay: Payer: MEDICARE

## 2017-12-19 LAB — POCT GLUCOSE: POC Glucose: 136 mg/dl — ABNORMAL HIGH (ref 70–108)

## 2017-12-19 MED ORDER — LIDOCAINE HCL 2 % IJ SOLN
2 % | INTRAMUSCULAR | Status: DC | PRN
Start: 2017-12-19 — End: 2017-12-19
  Administered 2017-12-19: 13:00:00 100 via INTRAVENOUS

## 2017-12-19 MED ORDER — NORMAL SALINE FLUSH 0.9 % IV SOLN
0.9 % | INTRAVENOUS | Status: DC | PRN
Start: 2017-12-19 — End: 2017-12-19

## 2017-12-19 MED ORDER — NORMAL SALINE FLUSH 0.9 % IV SOLN
0.9 % | Freq: Two times a day (BID) | INTRAVENOUS | Status: DC
Start: 2017-12-19 — End: 2017-12-19

## 2017-12-19 MED ORDER — PROPOFOL 200 MG/20ML IV EMUL
200 MG/20ML | INTRAVENOUS | Status: DC | PRN
Start: 2017-12-19 — End: 2017-12-19
  Administered 2017-12-19: 13:00:00 180 via INTRAVENOUS

## 2017-12-19 MED ORDER — SODIUM CHLORIDE 0.45 % IV SOLN
0.45 % | INTRAVENOUS | Status: DC
Start: 2017-12-19 — End: 2017-12-19
  Administered 2017-12-19: 13:00:00 via INTRAVENOUS

## 2017-12-19 NOTE — Anesthesia Post-Procedure Evaluation (Signed)
Department of Anesthesiology  Postprocedure Note    Patient: Micheal [name]  MRN: [id number]  Birthdate: Dec 12, 1955  Date of evaluation: 12/19/2017  Time:  8:53 AM     Procedure Summary     Date:  12/19/17 Room / Location:  [place] [facility]    Anesthesia Start:  [phone] Anesthesia Stop:  [phone]    Procedure:  COLONOSCOPY POLYPECTOMY HOT BIOPSY (Left Anus) Diagnosis:  (SCREENING)    Surgeon:  Earmon Phoenix, MD Responsible Provider:  Betti Cruz, DO    Anesthesia Type:  MAC ASA Status:  3          Anesthesia Type: MAC    Aldrete Phase I: Aldrete Score: 10    Aldrete Phase II:      Last vitals: Reviewed and per EMR flowsheets.       Anesthesia Post Evaluation    Patient location during evaluation: bedside  Patient participation: complete - patient participated  Level of consciousness: awake and alert  Pain score: 0  Airway patency: patent  Nausea & Vomiting: no nausea and no vomiting  Complications: no  Cardiovascular status: hemodynamically stable  Respiratory status: acceptable and spontaneous ventilation  Hydration status: euvolemic

## 2017-12-19 NOTE — Anesthesia Pre-Procedure Evaluation (Signed)
Department of Anesthesiology  Preprocedure Note       Name:  Micheal [name]   Age:  62 y.o.  DOB:  09-24-1955                                          MRN:  [id number]         Date:  12/19/2017      Surgeon: Juliann Mule):  Earmon Phoenix, MD    Procedure: COLONOSCOPY (Left Anus)    Medications prior to admission:   Prior to Admission medications    Medication Sig Start Date End Date Taking? Authorizing Provider   Handicap Placard MISC by Does not apply route Expires 03/01/2020 03/02/15  Yes Kyra Leyland, DO   oxyCODONE-acetaminophen (PERCOCET) 10-325 MG per tablet Take 1 tablet by mouth every 8 hours as needed for Pain for up to 30 days. 12/14/17 01/13/18  Kyra Leyland, DO   diazepam (VALIUM) 5 MG tablet Take 1 tablet by mouth every 8 hours as needed for Anxiety for up to 30 days. 12/07/17 01/06/18  Kyra Leyland, DO   tamsulosin (FLOMAX) 0.4 MG capsule Take 1 capsule by mouth nightly 11/06/17   Antony Contras, APRN - CNP   esomeprazole (NEXIUM) 40 MG delayed release capsule TAKE 1 CAPSULE BY MOUTH ONCE DAILY IN THE MORNING BEFORE BREAKFAST 11/03/17   Kyra Leyland, DO   gabapentin (NEURONTIN) 300 MG capsule Take 1 tab PO qhs x 1 week, then take 1 tab PO BID x 1 week, then take 1 tab PO TID. 10/24/17 04/22/18  Kyra Leyland, DO   metFORMIN (GLUCOPHAGE) 500 MG tablet Take 1 tablet by mouth 2 times daily (with meals) 10/24/17   Kyra Leyland, DO   PARoxetine (PAXIL) 40 MG tablet Take 1 tablet by mouth daily 10/24/17   Kyra Leyland, DO   fenofibrate (TRICOR) 145 MG tablet TAKE 1 TABLET BY MOUTH DAILY 10/03/17   Kyra Leyland, DO   glimepiride (AMARYL) 4 MG tablet TAKE 1 TABLET BY MOUTH IN THE MORNING 07/18/17   Kyra Leyland, DO   montelukast (SINGULAIR) 10 MG tablet Take 1 tablet by mouth daily 06/26/17   Kyra Leyland, DO   olopatadine (PATANOL) 0.1 % ophthalmic solution Place 1 drop into both eyes 2 times daily 05/15/17   Kyra Leyland, DO   ipratropium (ATROVENT) 0.03 % nasal spray 2 sprays by Nasal route 2 times daily 05/15/17  05/15/18  Kyra Leyland, DO   lisinopril (PRINIVIL;ZESTRIL) 40 MG tablet TAKE 1 TABLET BY MOUTH DAILY 04/27/17   Kyra Leyland, DO   isosorbide mononitrate (IMDUR) 30 MG extended release tablet TAKE 1 TABLET BY MOUTH ONCE DAILY 02/13/17   Zoheir Gavin Potters, MD   atorvastatin (LIPITOR) 80 MG tablet TAKE 1 TABLET BY MOUTH NIGHTLY 02/13/17   Zoheir A Charlyn Minerva, MD   metoprolol succinate (TOPROL XL) 25 MG extended release tablet TAKE 1 TABLET BY MOUTH ONCE DAILY 02/13/17   Zoheir Gavin Potters, MD   BRILINTA 90 MG TABS tablet TAKE 1 TABLET BY MOUTH TWICE DAILY 02/13/17   Zoheir Gavin Potters, MD   nitroGLYCERIN (NITROSTAT) 0.4 MG SL tablet Place 1 tablet under the tongue every 5 minutes as needed for Chest pain  Patient not taking: Reported on 11/29/2017 05/11/16   Kyra Leyland, DO   aspirin 81 MG  chewable tablet Take 1 tablet by mouth daily 05/08/16   Lenord Carbo, APRN - CNP   hydrocortisone 2.5 % cream Apply to external ear BID PRN  Patient not taking: Reported on 11/29/2017 01/13/16   Kyra Leyland, DO   Multiple Vitamins-Minerals (CENTRUM SILVER ULTRA MENS) TABS Take 1 tablet by mouth daily    Historical Provider, MD       Current medications:    Current Facility-Administered Medications   Medication Dose Route Frequency Provider Last Rate Last Dose   ??? 0.45 % sodium chloride infusion   Intravenous Continuous Earmon Phoenix, MD 75 mL/hr at 12/19/17 0759     ??? sodium chloride flush 0.9 % injection 10 mL  10 mL Intravenous 2 times per day Earmon Phoenix, MD       ??? sodium chloride flush 0.9 % injection 10 mL  10 mL Intravenous PRN Earmon Phoenix, MD           Allergies:  No Known Allergies    Problem List:    Patient Active Problem List   Diagnosis Code   ??? Coronary artery disease of native heart with stable angina pectoris ([place]) I25.118   ??? Essential hypertension I10   ??? Mixed hyperlipidemia E78.2   ??? Type 2 diabetes mellitus without complication, without long-term current use of insulin (HCC) E11.9   ??? History of  coronary artery stent placement Z95.5   ??? Hx of CABG Z95.1   ??? GAD (generalized anxiety disorder) F41.1   ??? Allergic conjunctivitis of both eyes H10.13   ??? ACS (acute coronary syndrome) (HCC) I24.9   ??? Chest pain with moderate risk of acute coronary syndrome R07.9   ??? S/P cardiac cath Z98.890   ??? Class 1 obesity in adult E66.9   ??? Angina, class III (HCC) I20.9   ??? Encounter for screening colonoscopy Z12.11       Past Medical History:        Diagnosis Date   ??? Arthritis     general   ??? CAD (coronary artery disease)     s/p stent placement-sees Dr. Irene Limbo   ??? Chronic back pain    ??? Diabetes mellitus ([place])    ??? GERD (gastroesophageal reflux disease)    ??? Hyperlipidemia    ??? Hypertension        Past Surgical History:        Procedure Laterality Date   ??? COLONOSCOPY  2015    [place] [place]   ??? CORONARY ARTERY BYPASS GRAFT  2009    6 vessel-[place]   ??? DIAGNOSTIC CARDIAC CATH LAB PROCEDURE     ??? ORTHOPEDIC SURGERY Left    ??? PTCA  07/04/2016   ??? TONSILLECTOMY         Social History:    Social History     Tobacco Use   ??? Smoking status: Former Smoker     Packs/day: 1.00     Years: 35.00     Pack years: 35.00     Types: Cigarettes     Last attempt to quit: 05/24/2008     Years since quitting: 9.5   ??? Smokeless tobacco: Never Used   Substance Use Topics   ??? Alcohol use: Yes     Alcohol/week: 1.0 standard drinks     Types: 1 Glasses of wine per week     Comment: once monthly stopped a over 2 years  Counseling given: Not Answered      Vital Signs (Current):   Vitals:    12/19/17 0742   BP: 120/79   Pulse: 70   Resp: 16   Temp: 36.2 ??C (97.2 ??F)   TempSrc: Temporal   SpO2: 97%   Weight: 206 lb (93.4 kg)   Height: _0  (1.702 m)                                              BP Readings from Last 3 Encounters:   12/19/17 120/79   11/29/17 112/68   11/15/17 137/77       NPO Status: Time of last liquid consumption: 2200                        Time of last solid consumption: 1700                         Date of last liquid consumption: 12/18/17                        Date of last solid food consumption: 12/17/17    BMI:   Wt Readings from Last 3 Encounters:   12/19/17 206 lb (93.4 kg)   11/29/17 209 lb (94.8 kg)   11/15/17 211 lb (95.7 kg)     Body mass index is 32.26 kg/m??.    CBC:   Lab Results   Component Value Date    WBC 7.5 07/04/2016    RBC 4.58 07/04/2016    HGB 13.5 07/04/2016    HCT 40.1 07/04/2016    MCV 87.6 07/04/2016    RDW 13.9 07/04/2016    PLT 261 07/04/2016       CMP:   Lab Results   Component Value Date    NA 140 10/24/2017    K 4.3 10/24/2017    K 4.0 07/04/2016    CL 102 10/24/2017    CO2 24 10/24/2017    BUN 15 10/24/2017    CREATININE 1.0 10/24/2017    LABGLOM 75 10/24/2017    GLUCOSE 117 10/24/2017    PROT 8.0 10/24/2017    CALCIUM 10.4 10/24/2017    BILITOT 0.3 10/24/2017    ALKPHOS 34 10/24/2017    AST 30 10/24/2017    ALT 41 10/24/2017       POC Tests:   Recent Labs     12/19/17  0735   POCGLU 136*       Coags:   Lab Results   Component Value Date    INR 1.01 07/04/2016    APTT 26.8 07/04/2016       HCG (If Applicable): No results found for: PREGTESTUR, PREGSERUM, HCG, HCGQUANT     ABGs: No results found for: PHART, PO2ART, PCO2ART, HCO3ART, BEART, O2SATART     Type & Screen (If Applicable):  Lab Results   Component Value Date    LABRH POS 07/04/2016       Anesthesia Evaluation  Patient summary reviewed  Airway: Mallampati: I  TM distance: >3 FB   Neck ROM: full  Mouth opening: > = 3 FB Dental: normal exam         Pulmonary:Negative Pulmonary ROS and normal exam  Cardiovascular:    (+) hypertension:, CAD:, CABG/stent:,                   Neuro/Psych:   (+) depression/anxiety             GI/Hepatic/Renal:   (+) GERD: well controlled,           Endo/Other:    (+) DiabetesType II DM, well controlled, , .                 Abdominal:           Vascular: negative vascular ROS.                                       Anesthesia Plan      MAC     ASA 3        Induction: intravenous.      Anesthetic plan and risks discussed with patient.      Plan discussed with attending.    Attending anesthesiologist reviewed and agrees with [place], APRN - CRNA   12/19/2017

## 2017-12-19 NOTE — Op Note (Signed)
Colonoscopy Procedure Note      Patient: Micheal [name]    DATE OF PROCEDURE: 12/19/2017    SURGEON: Micheal BuffMichael T Tiearra Colwell MD FACS.     PREOPERATIVE DIAGNOSIS: Active Problems:    Polyp of transverse colon  Resolved Problems:    * No resolved hospital problems. *         POSTOPERATIVE DIAGNOSIS: Active Problems:    Polyp of transverse colon  Resolved Problems:    * No resolved hospital problems. *        OPERATION: Total colonoscopy to the cecum with  polypectomy at:   70  cm from anus PROX TRANS COLON  65  cm from anus  PROX TRANS COLON      ANESTHESIA:by anesthesia  ebl=0  SPECIMENS: POLYPS X 2    COMPLICATIONS: none    BRIEF HISTORY:  Micheal [name] is a 62 y.o. Caucasian male with a history of Active Problems:    Polyp of transverse colon  Resolved Problems:    * No resolved hospital problems. *    . I discussed "colonoscopy, possible biopsy, polypectomy or cauterization" with the patient, including the procedure, benefits, risks and alternatives. He  agreed.     PROCEDURE IN DETAIL: The patient was taken to the endoscopy suite, was placed on the cart in the left side down decubitus position.He  was given intravenous sedation by the anesthesia service. Once sedation had taken effect, a digital rectal exam was performed which revealed no palpable lesions.     A lubricated  PCF160AL Adjustable Pediatric colonoscope was inserted into the anus and under direct visualization with air insufflation, the scope was passed through the rectum, through the sigmoid colon, and over into the cecum.  The Cecum was confirmed by visualization of the cecal strap, appendiceal orifice and the ileocecal valve was identified. While slowing withdrawing the endoscope, the entire circumference of the colon and rectum were again examined for lesions.   [facility] cm from the anus, a diminutive  polyp was identified. It was  removed by cold biopsy and sent for pathology     [facility] 65 CM cm from the anus, a  diminutive  polyp was identified. It was  removed by hot biopsy and sent for pathology          No other tumors, polyps, ulcers or inflammatory changes or any other lesions were seen throughout the remainder of the colon. The scope was withdrawn into the rectum, which appeared normal. The scope was retroflexed toward the anus, which appeared normal. The scope was straightened and removed. The patient tolerated the procedure well.       Recommendations: Repeat colonoscopy in 5 years.        Micheal BuffMichael T Cherene Dobbins, MD FACS  Electronically signed 12/19/2017 at 8:52 AM

## 2017-12-19 NOTE — Progress Notes (Signed)
Scope PCF 901. Patient tolerated procedure well. Pictures taken. Biopsies taken labeled and sent.

## 2017-12-19 NOTE — Progress Notes (Signed)
Tried IV x 2 unsuccessful.

## 2017-12-19 NOTE — Discharge Instructions (Addendum)
Discharge Instructions  Colonoscopy       Colonoscopy is a visual exam of the lining of the large intestine, also called the bowel or colon, with a colonoscope.  A colonoscope is a flexible tube with a light and a viewing device.  It allows the doctor to view the inside of the colon through a tiny video camera.   Colonoscopy is performed for many reasons: unexplained anemia, pain, diarrhea, bloody stools, and cancer screening, among many other reasons.  Complications from a colonoscopy are rare.  Some possible serious complications include perforated bowel (which might require surgery) and bleeding (which could require blood transfusion).  Minor complications include bloating, gas, and cramping that can last for 1-2 days after the procedure.  Because air is put into your colon during the procedure, it is normal to pass large amounts of air from your rectum.  You may not have a bowel movement for 1-3 days after the procedure.    What You Will Need   Someone to drive you home after the procedure     [place] when you get home.   Because the sedative will make you drowsy, don't drive, operate machinery, or make important decisions the day of the procedure.   Feelings of bloating, gas, or cramping may persist for 24 hours.    Diet   Try sips of water first.  If tolerated, resume a regular diet.   Do not drink alcohol for 24 hours.     Physical Activity   You may return to work in 12-24 hours as tolerated.   Do not drive, operate heavy machinery, or do activities that require coordination or balance for 24 hours.   Otherwise, return to your normal routine as soon as you are comfortable to do so, which is usually the next day after the procedure.      If you had a biopsy or polyp removed   *No heavy lifting for 1 week.   *NO aspirin containing product or blood thinners for 10 days  Tylenol is OK.   *You may resume your usual diet after polypectomy.    Medications   When taking medications, it's important to:   Take  your medication as directed.   Do not stop taking them without consulting your healthcare provider.   Don't share them with anyone else.   Know what effects and side effects to expect and report them to your healthcare provider.  If you are taking more than one drug, even if it is an over-the-counter medication, herb, or dietary supplement, be sure to check with a physician or pharmacist about drug interactions.   Plan ahead for refills so you don't run out.    Follow-up   The doctor will usually give you a preliminary report after the medication wears off and you are more alert.  The results from a biopsy can take as long as 1 week to be completed.    You should schedule a follow-up colonoscopy in 5  years.    Call Your Doctor If Any of the Following Occurs   Monitor your recovery once you leave the hospital.  As soon as you have a problem, alert your doctor.  If any of the following occur, call your doctor:   Bleeding from your rectum; notify your doctor if you pass a teaspoonful or more of blood   Black, tarry stools   Severe abdominal pain   Hard, swollen abdomen   Signs of infection, including  fever or chills   Inability to pass gas or stool   Coughing, shortness of breath, chest pain, severe nausea or vomiting   In case of an emergency, call 911 immediately.    Office Number: [phone]        Florian BuffMichael T Sheehan MD FACS 12/19/2017 8:55 AM

## 2017-12-19 NOTE — H&P (Signed)
[facility]  History and Physical Update    Pt Name: Micheal BirminghamDavid A Rimel  MRN: [id number]  Birthdate: [birth year]  Date of evaluation: 12/19/2017    [x]  I have examined the patient and reviewed the H&P/Consult and there are no changes to the patient or plans.    []  I have examined the patient and reviewed the H&P/Consult and have noted the following changes:        Florian BuffMichael T Oneika Simonian  Electronically signed 12/19/2017 at 7:40 AM

## 2017-12-19 NOTE — Progress Notes (Signed)
Oriented to endo room 3

## 2017-12-19 NOTE — Progress Notes (Signed)
0855 Pt to ENDO PACU easily arouseable.   [id number] friend called to bedside.   0900 Dr Willette PaSheehan at bedside speaking to patient and friend.   [id number] IV removed  0910 Discharge instructions given.   0915 pt dressing per self, tolerating well. To car via wheelhchair

## 2017-12-27 ENCOUNTER — Encounter

## 2018-01-03 ENCOUNTER — Encounter

## 2018-01-03 MED ORDER — DIAZEPAM 5 MG PO TABS
5 MG | ORAL_TABLET | Freq: Three times a day (TID) | ORAL | 0 refills | Status: DC | PRN
Start: 2018-01-03 — End: 2018-02-09

## 2018-01-03 NOTE — Telephone Encounter (Signed)
ASSESSMENT & PLAN  [name] was seen today for medication refill.    Diagnoses and all orders for this visit:    GAD (generalized anxiety disorder)  -     diazepam (VALIUM) 5 MG tablet; Take 1 tablet by mouth every 8 hours as needed for Anxiety for up to 30 days.         OAARS reviewed and appropriate.     Controlled Substances Monitoring: Periodic Controlled Substance Monitoring: Possible medication side effects, risk of tolerance/dependence & alternative treatments discussed., No signs of potential drug abuse or diversion identified. Stark Klein(Tameah Mihalko C Tacara Hadlock, DO)      Future Appointments   Date Time Provider Department Center   01/08/2018 10:30 AM Jone BasemanJennifer E Simpson, APRN - CNP [facility] [place]   02/26/2018 10:00 AM Cecille RubinMark D Kahle, DO [facility] [place]   11/07/2018 10:30 AM Zoheir Derl BarrowA Abdelbaki, MD [facility] [place]

## 2018-01-03 NOTE — Telephone Encounter (Signed)
Micheal Birminghamavid A Fornwalt called requesting a refill on the following medications:  Requested Prescriptions     Pending Prescriptions Disp Refills   ??? diazepam (VALIUM) 5 MG tablet 90 tablet 0     Sig: Take 1 tablet by mouth every 8 hours as needed for Anxiety for up to 30 days.     [facility] [address]  [facility]      Date of last visit: 11/15/2017  Date of next visit (if applicable): 02/26/2018

## 2018-01-08 ENCOUNTER — Ambulatory Visit: Admit: 2018-01-08 | Discharge: 2018-01-08 | Payer: MEDICARE | Attending: Family | Primary: Family

## 2018-01-08 DIAGNOSIS — R350 Frequency of micturition: Secondary | ICD-10-CM

## 2018-01-08 LAB — POCT URINALYSIS DIPSTICK W/O MICROSCOPE (AUTO)
Bilirubin Urine: NEGATIVE
Blood, UA POC: NEGATIVE
Glucose, Ur: NEGATIVE mg/dl
Ketones, Urine: NEGATIVE
Leukocyte Clumps, Urine: NEGATIVE
Nitrite, Urine: NEGATIVE
Protein, Urine: NEGATIVE mg/dl
Specific Gravity, Urine: <=1.005 (ref 1.002–1.03)
Urobilinogen, Urine: 0.2 eu/dl (ref 0.0–1.0)
pH, Urine: 5.5 (ref 5.0–9.0)

## 2018-01-08 LAB — POST VOID RESIDUAL (PVR): post void residual: 0 ml

## 2018-01-08 NOTE — Telephone Encounter (Signed)
[facility]ave print [facility] for [facility]imix [facility]

## 2018-01-08 NOTE — Progress Notes (Signed)
[facility]  [facility]  [address]  [address]  [place] [id number]  Dept: [phone]  Loc: [phone]  Visit Date: 01/08/2018      HPI:     Micheal [name] is a 62 y.o. with past medical history of HTN, HLD, GERD who follows up today for Erectile Dysfunction and BPH with LUTS.  Micheal [name] was last seen in October.  At that time he reported troubles both getting and keeping an erection.  He had not tried any medications.  He is on Imdur daily and nitro as needed. He also complained of urinary frequency and nocturia.  At that time he was started on Flomax daily for his urinary symptoms.  We reached out to cardiology regarding oral options for ED.  Called patient and told him his only option would be to try Trimix injections.  He has not called back with a decision.  He follows up today for symptom recheck with a PVR.    Today, Micheal [name] reports his urinary symptoms have improved with Flomax.  He is only getting up a couple times a week maybe 1 time a night to urinate.  He is happy with his symptom control and would like to continue the Flomax.  He is still hesitant about trying Trimix injections.  His urine was negative for signs of infection.  His PVR was 0 mL.  He comes in alone today.  History was obtained from patient and medical records.      Current Outpatient Medications   Medication Sig Dispense Refill   ??? diazepam (VALIUM) 5 MG tablet Take 1 tablet by mouth every 8 hours as needed for Anxiety for up to 30 days. 90 tablet 0   ??? oxyCODONE-acetaminophen (PERCOCET) 10-325 MG per tablet Take 1 tablet by mouth every 8 hours as needed for Pain for up to 30 days. 90 tablet 0   ??? tamsulosin (FLOMAX) 0.4 MG capsule Take 1 capsule by mouth nightly 90 capsule 3   ??? esomeprazole (NEXIUM) 40 MG delayed release capsule TAKE 1 CAPSULE BY MOUTH ONCE DAILY IN THE MORNING BEFORE BREAKFAST 90 capsule 3   ??? gabapentin (NEURONTIN) 300 MG capsule Take 1 tab PO qhs x 1 week, then take 1 tab PO  BID x 1 week, then take 1 tab PO TID. 90 capsule 5   ??? metFORMIN (GLUCOPHAGE) 500 MG tablet Take 1 tablet by mouth 2 times daily (with meals) 180 tablet 1   ??? PARoxetine (PAXIL) 40 MG tablet Take 1 tablet by mouth daily 90 tablet 3   ??? fenofibrate (TRICOR) 145 MG tablet TAKE 1 TABLET BY MOUTH DAILY 90 tablet 3   ??? glimepiride (AMARYL) 4 MG tablet TAKE 1 TABLET BY MOUTH IN THE MORNING 90 tablet 1   ??? montelukast (SINGULAIR) 10 MG tablet Take 1 tablet by mouth daily 90 tablet 3   ??? olopatadine (PATANOL) 0.1 % ophthalmic solution Place 1 drop into both eyes 2 times daily 1 Bottle 5   ??? ipratropium (ATROVENT) 0.03 % nasal spray 2 sprays by Nasal route 2 times daily 1 Bottle 5   ??? lisinopril (PRINIVIL;ZESTRIL) 40 MG tablet TAKE 1 TABLET BY MOUTH DAILY 90 tablet 3   ??? isosorbide mononitrate (IMDUR) 30 MG extended release tablet TAKE 1 TABLET BY MOUTH ONCE DAILY 90 tablet 3   ??? atorvastatin (LIPITOR) 80 MG tablet TAKE 1 TABLET BY MOUTH NIGHTLY 90 tablet 3   ??? metoprolol succinate (  TOPROL XL) 25 MG extended release tablet TAKE 1 TABLET BY MOUTH ONCE DAILY 90 tablet 3   ??? BRILINTA 90 MG TABS tablet TAKE 1 TABLET BY MOUTH TWICE DAILY 180 tablet 3   ??? nitroGLYCERIN (NITROSTAT) 0.4 MG SL tablet Place 1 tablet under the tongue every 5 minutes as needed for Chest pain 25 tablet 2   ??? hydrocortisone 2.5 % cream Apply to external ear BID PRN 1 Tube 2   ??? Handicap Placard MISC by Does not apply route Expires 03/01/2020 1 each 0   ??? Multiple Vitamins-Minerals (CENTRUM SILVER ULTRA MENS) TABS Take 1 tablet by mouth daily       No current facility-administered medications for this visit.        Past Medical History  Micheal [name]  has a past medical history of Arthritis, CAD (coronary artery disease), Chronic back pain, Diabetes mellitus (HCC), GERD (gastroesophageal reflux disease), Hyperlipidemia, and Hypertension.    Past Surgical History  The patient  has a past surgical history that includes Coronary artery bypass graft (2009); orthopedic  surgery (Left); Diagnostic Cardiac Cath Lab Procedure; Percutaneous Transluminal Coronary Angio (07/04/2016); Tonsillectomy; Colonoscopy (2015); and Colonoscopy (Left, 12/19/2017).    Family History  This patient's family history includes Cancer in his mother; Diabetes in his brother; Early Death in his brother; Heart Disease in his brother; High Blood Pressure in his brother; High Cholesterol in his brother; Mental Retardation in his sister; Stroke in his brother.    Social History  Micheal [name]  reports that he quit smoking about 9 years ago. His smoking use included cigarettes. He has a 35.00 pack-year smoking history. He has never used smokeless tobacco. He reports current alcohol use of about 1.0 standard drinks of alcohol per week. He reports that he does not use drugs.    Subjective:     Review of Systems  No problems with ears, nose or throat.  No problems with eyes.  No chest pain, shortness of breath, abdominal pain, extremity pain or weakness, and no neurological deficits.  No rashes.  GU symptoms per HPI.  The remainder of the review of symptoms is negative.    Objective:     PE:   Vitals:    01/08/18 1032   BP: 122/80   Weight: 208 lb 14.4 oz (94.8 kg)   Height: 5\' 7"  (1.702 m)       Constitutional: Alert and oriented times 3, no acute distress and cooperative to examination with appropriate mood and affect.   HENT:   Head:        Normocephalic and atraumatic.   Mouth/Throat:         Mucous membranes are normal.   Eyes:         EOM are normal. No scleral icterus. PERRLA.   Neck:        Supple, symmetrical, trachea midline  Pulmonary/Chest:  Normal respiratory rate and rhthym.  No use of accessory muscles.   Abdominal:         Soft. No tenderness.   Musculoskeletal:         Normal range of motion.  No edema or tenderness of lower extremities.    Extremities: No cyanosis, clubbing, or edema present.  Neurological:        Alert and oriented. No cranial nerve deficit. [name]    Psychiatric:        Normal mood and  affect.      Labs   Urine dip demonstrates   Results for  POC orders placed in visit on 01/08/18   POCT Urinalysis No Micro (Auto)   Result Value Ref Range    Glucose, Ur Negative NEGATIVE mg/dl    Bilirubin Urine Negative     Ketones, Urine Negative NEGATIVE    Specific Gravity, Urine <= 1.005 1.002 - 1.03    Blood, UA POC Negative NEGATIVE    pH, Urine 5.50 5.0 - 9.0    Protein, Urine Negative NEGATIVE mg/dl    Urobilinogen, Urine 0.20 0.0 - 1.0 eu/dl    Nitrite, Urine Negative NEGATIVE    Leukocyte Clumps, Urine Negative NEGATIVE    Color, Urine Yellow YELLOW-STR    Character, Urine Clear CLR-SL.CLO   poct post void residual   Result Value Ref Range    post void residual 0 ml       Patients recent PSA values are as follows  Lab Results   Component Value Date    PSA 2.74 (H) 11/07/2017    PSA 1.33 09/03/2014        Recent BUN/Creatinine:  Lab Results   Component Value Date    BUN 15 10/24/2017    CREATININE 1.0 10/24/2017       Radiology  No imaging reviewed       Assessment & Plan:     Erectile Dysfunction  BPH with LUTS    Urine today was negative for signs of infection  PVR was 0  Continue Flomax nightly  Discussed Trimix injections at length with patient  He would like to try these, we will get him scheduled with MD for teaching    No follow-ups on file.    Jone Baseman, APRN-CNP  Urology

## 2018-01-11 ENCOUNTER — Encounter

## 2018-01-11 MED ORDER — OXYCODONE-ACETAMINOPHEN 10-325 MG PO TABS
10-325 MG | ORAL_TABLET | Freq: Three times a day (TID) | ORAL | 0 refills | Status: DC | PRN
Start: 2018-01-11 — End: 2018-02-09

## 2018-01-11 NOTE — Telephone Encounter (Signed)
Micheal [name] called requesting a refill on the following medications:  Requested Prescriptions     Pending Prescriptions Disp Refills   ??? oxyCODONE-acetaminophen (PERCOCET) 10-325 MG per tablet 90 tablet 0     Sig: Take 1 tablet by mouth every 8 hours as needed for Pain for up to 30 days.     Pharmacy verified: [facility] [address]  .[address]      Date of last visit: 11/15/17  Date of next visit (if applicable): 02/26/2018                        /

## 2018-01-12 NOTE — Telephone Encounter (Signed)
Patient calling in to check the status of this being ordered, please call him to advise.

## 2018-01-12 NOTE — Telephone Encounter (Signed)
I spoke with the patient and apologized for the delay in his Trimix order. [facility]

## 2018-02-02 ENCOUNTER — Ambulatory Visit: Admit: 2018-02-02 | Discharge: 2018-02-02 | Payer: MEDICARE | Attending: Family Medicine | Primary: Family Medicine

## 2018-02-02 DIAGNOSIS — J019 Acute sinusitis, unspecified: Secondary | ICD-10-CM

## 2018-02-02 MED ORDER — DOXYCYCLINE HYCLATE 100 MG PO TABS
100 MG | ORAL_TABLET | Freq: Two times a day (BID) | ORAL | 0 refills | Status: AC
Start: 2018-02-02 — End: 2018-02-12

## 2018-02-02 MED ORDER — IPRATROPIUM BROMIDE 0.03 % NA SOLN
0.03 % | Freq: Three times a day (TID) | NASAL | 5 refills | Status: DC
Start: 2018-02-02 — End: 2018-12-04

## 2018-02-02 NOTE — Patient Instructions (Signed)
You may receive a survey about your visit with us today. The feedback from our patients helps us identify what is working well and where the service to all patients can be enhanced. Thank you!

## 2018-02-02 NOTE — Progress Notes (Signed)
SUBJECTIVE:  Micheal [name] is a 63 y.o. y/o male that presents with Cough (productive cough with yellow phglem- 7 days ago; body aches); Nasal Congestion (7 days- yellow mucous); and Fever (100 first started feeling sick; chills)  .    HPI:      Symptoms have been present for 7 day(s).  Symptoms are worse since they initially started.    Fever? No, but has felt warm at times  Runny nose or congestion?  Yes - very congested   Cough?  Yes - moderately productive  Sore throat?  Yes  Headache, fatigue, joint pains, muscle aches?  Yes - myalgias, headaches  Shortness of breath/Wheezing?  No  Nausea/Vomiting/Diarrhea?  No  Double Sickening?  Yes  Sick contacts? No    Patient has tried robitussin with minimal improvement.      Past Medical History:   Diagnosis Date   ??? Arthritis     general   ??? CAD (coronary artery disease)     s/p stent placement-sees Dr. Gabrielle Dare   ??? Chronic back pain    ??? Diabetes mellitus (HCC)    ??? GERD (gastroesophageal reflux disease)    ??? Hyperlipidemia    ??? Hypertension        Social History     Socioeconomic History   ??? Marital status: Married     Spouse name: Not on file   ??? Number of children: Not on file   ??? Years of education: Not on file   ??? Highest education level: Not on file   Occupational History   ??? Not on file   Social Needs   ??? Financial resource strain: Not on file   ??? Food insecurity:     Worry: Not on file     Inability: Not on file   ??? Transportation needs:     Medical: Not on file     Non-medical: Not on file   Tobacco Use   ??? Smoking status: Former Smoker     Packs/day: 1.00     Years: 35.00     Pack years: 35.00     Types: Cigarettes     Last attempt to quit: 05/24/2008     Years since quitting: 9.7   ??? Smokeless tobacco: Never Used   Substance and Sexual Activity   ??? Alcohol use: Yes     Alcohol/week: 1.0 standard drinks     Types: 1 Glasses of wine per week     Comment: once monthly stopped a over 2 years   ??? Drug use: No   ??? Sexual activity: Yes     Partners: Female   Lifestyle    ??? Physical activity:     Days per week: Not on file     Minutes per session: Not on file   ??? Stress: Not on file   Relationships   ??? Social connections:     Talks on phone: Not on file     Gets together: Not on file     Attends religious service: Not on file     Active member of club or organization: Not on file     Attends meetings of clubs or organizations: Not on file     Relationship status: Not on file   ??? Intimate partner violence:     Fear of current or ex partner: Not on file     Emotionally abused: Not on file     Physically abused: Not on file  Forced sexual activity: Not on file   Other Topics Concern   ??? Not on file   Social History Narrative   ??? Not on file       Family History   Problem Relation Age of Onset   ??? Cancer Mother    ??? Heart Disease Brother    ??? Stroke Brother    ??? Diabetes Brother    ??? Early Death Brother    ??? High Blood Pressure Brother    ??? High Cholesterol Brother    ??? Mental Retardation Sister            OBJECTIVE:  BP 108/61    Pulse 75    Temp 97.9 ??F (36.6 ??C) (Oral)    Resp 14    Ht 5' 9.5" (1.765 m)    Wt 211 lb (95.7 kg)    SpO2 97%    BMI 30.71 kg/m??   General appearance: alert, well appearing, and in no distress.  ENT exam reveals - neck without nodes, pharynx erythematous without exudate and nasal mucosa congested.   CVS exam: normal rate, regular rhythm, normal S1, S2, no murmurs, rubs, clicks or gallops.  Chest:clear to auscultation, no wheezes, rales or rhonchi, symmetric air entry.   Abdominal exam: soft, nontender, nondistended, no masses or organomegaly.  Extremities:  No clubbing, cyanosis or edema  Skin exam - normal coloration and turgor, no rashes, no suspicious skin lesions noted.  Psych -  Affect appropriate.  Thought process is normal without evidence of depression or psychosis.    Good insight and appropriae interaction.  Cognition and memory appear to be intact.        ASSESSMENT & PLAN  Evertt was seen today for cough, nasal congestion and  fever.    Diagnoses and all orders for this visit:    Acute rhinosinusitis  -     doxycycline hyclate (VIBRA-TABS) 100 MG tablet; Take 1 tablet by mouth 2 times daily for 10 days  -     ipratropium (ATROVENT) 0.03 % nasal spray; 2 sprays by Nasal route 3 times daily        Return if symptoms worsen or fail to improve.     -Patient advised to call immediately or go to ER if any worsening of symptoms  -Patient counseled on conservative care including fluids, rest and OTC meds    [name] received counseling on the following healthy behaviors: medication adherence  Reviewed prior labs and health maintenance.  Continue current medications, diet and exercise.  Discussed use, benefit, and side effects of prescribed medications. Barriers to medication compliance addressed.   Patient given educational materials - see patient instructions.    All patient questions answered.  Patient voiced understanding.       I have reviewed this patient's history, habits, and medication list and have updated the chart where appropriate.

## 2018-02-02 NOTE — Telephone Encounter (Signed)
5 days

## 2018-02-02 NOTE — Telephone Encounter (Signed)
Pre op Risk Assessment    Procedure Resection lesion soft tissue back flank   Physician Dr. Maisie Fus Scharschmidt  Date of surgery/procedure 02/15/2018    Last OV 11/01/17  Last Stress  05/02/16  Last Echo 05/02/16  Last Cath 07/03/16  Last Stent 07/03/16  Is patient on blood thinners pt states he is taking 81 asa  Hold Meds/how many days ??

## 2018-02-05 NOTE — Telephone Encounter (Signed)
Pt notified   Form out for signature

## 2018-02-05 NOTE — Telephone Encounter (Signed)
Form faxed

## 2018-02-08 ENCOUNTER — Encounter

## 2018-02-08 MED ORDER — GLIMEPIRIDE 4 MG PO TABS
4 MG | ORAL_TABLET | ORAL | 3 refills | Status: DC
Start: 2018-02-08 — End: 2018-09-03

## 2018-02-09 ENCOUNTER — Encounter

## 2018-02-09 MED ORDER — DIAZEPAM 5 MG PO TABS
5 MG | ORAL_TABLET | Freq: Three times a day (TID) | ORAL | 0 refills | Status: DC | PRN
Start: 2018-02-09 — End: 2018-03-08

## 2018-02-09 MED ORDER — OXYCODONE-ACETAMINOPHEN 10-325 MG PO TABS
10-325 MG | ORAL_TABLET | Freq: Three times a day (TID) | ORAL | 0 refills | Status: DC | PRN
Start: 2018-02-09 — End: 2018-03-08

## 2018-02-09 NOTE — Telephone Encounter (Signed)
02/09/2018   [name] called requesting a refill on the following medications:  Requested Prescriptions     Pending Prescriptions Disp Refills   ??? oxyCODONE-acetaminophen (PERCOCET) 10-325 MG per tablet 90 tablet 0     Sig: Take 1 tablet by mouth every 8 hours as needed for Pain for up to 30 days.   ??? diazepam (VALIUM) 5 MG tablet 90 tablet 0     Sig: Take 1 tablet by mouth every 8 hours as needed for Anxiety for up to 30 days.     Pharmacy verified:  .pv      Date of last visit: 02/02/2018  Date of next visit (if applicable): 02/26/2018

## 2018-02-16 MED ORDER — ISOSORBIDE MONONITRATE ER 30 MG PO TB24
30 MG | ORAL_TABLET | ORAL | 0 refills | Status: DC
Start: 2018-02-16 — End: 2018-05-29

## 2018-02-16 MED ORDER — METOPROLOL SUCCINATE ER 25 MG PO TB24
25 MG | ORAL_TABLET | ORAL | 0 refills | Status: DC
Start: 2018-02-16 — End: 2018-05-29

## 2018-02-26 ENCOUNTER — Ambulatory Visit: Admit: 2018-02-26 | Discharge: 2018-02-26 | Payer: MEDICARE | Attending: Family Medicine | Primary: Family Medicine

## 2018-02-26 DIAGNOSIS — E119 Type 2 diabetes mellitus without complications: Secondary | ICD-10-CM

## 2018-02-26 LAB — POCT MICROALBUMIN
Albumin Urine: 30
Albumin/Creatinine Ratio: <30
Creatinine Ur POCT: 300

## 2018-02-26 LAB — POCT GLYCOSYLATED HEMOGLOBIN (HGB A1C): Hemoglobin A1C: 6.4 %

## 2018-02-26 MED ORDER — FLUTICASONE PROPIONATE 50 MCG/ACT NA SUSP
50 MCG/ACT | Freq: Every day | NASAL | 5 refills | Status: DC
Start: 2018-02-26 — End: 2018-10-03

## 2018-02-26 NOTE — Progress Notes (Signed)
Visit Information    Have you changed or started any medications since your last visit including any over-the-counter medicines, vitamins, or herbal medicines? no   Are you having any side effects from any of your medications? -  no  Have you stopped taking any of your medications? Is so, why? -  no    Have you seen any other physician or provider since your last visit? Yes - Records Obtained  Have you had any other diagnostic tests since your last visit? Yes - Records Obtained  Have you been seen in the emergency room and/or had an admission to a hospital since we last saw you? No  Have you had your routine dental cleaning in the past 6 months? no    Have you activated your MyChart account? If not, what are your barriers? Yes     Patient Care Team:  Mark D Kahle, DO as PCP - General (Family Medicine)  Mark D Kahle, DO as PCP - [facility] Empaneled Provider

## 2018-02-26 NOTE — Progress Notes (Signed)
Micheal [name] is a 63 y.o. male that presents for     Chief Complaint   Patient presents with   ??? Follow-up     hasnt been checking BS or BP- no other concerns       BP 104/60    Pulse 80    Temp 98.1 ??F (36.7 ??C) (Oral)    Resp 12    Ht 5' 9.5" (1.765 m)    Wt 208 lb (94.3 kg)    BMI 30.28 kg/m??       HPI:    GAD:   States that anxiety is 'off and on'.  Doing ok since his back surgery.  Sleep is 'alright'.      Notes more left sided nasal congestion recently.  Has been using atrovent without improvement.    Chronic Pain    HPI:    Patient has chronic pain of the low back.  He did have recent surgery.  States that he is having a little more low back pain surgery.  He is scheduled for follow up in 1 week.  Pain control: inadequate  Improvement in function and ADLs?  yes  Current Medications:  Percocet PRN, planning to stop this   Escalation of dosage recently?  no    Evidence for abuse or diversion? no   Seen specialist or pain clinic?: yes      Controlled Substances Monitoring:        Diabetes Type 2    Glucose control:   Does patient check blood glucoses at home?  No  Report of hypoglycemia: no  Lab Results   Component Value Date    LABA1C 6.4 02/26/2018     No results found for: EAG    Symptoms  Polyuria, Polydipsia or Polyphagia?   No  Chest Pain, SOB, or Palpitations? -  No  New Vision complaints? No  Paresthesias of the extremities?  No    Medications  Current medication were reviewed.  Compliant with medications?  yes  Medication side effects?  Yes, notes diarrhea with the Metformin  On ACE-I or ARB?  Yes  On antiplatelet therapy?  Yes  On Statin?  Yes      Exercise  Exercise? Not since his surgery  Wt Readings from Last 3 Encounters:   02/26/18 208 lb (94.3 kg)   02/02/18 211 lb (95.7 kg)   01/08/18 208 lb 14.4 oz (94.8 kg)       Diet discipline?:  Low salt, fat, sugar diet?  Not doing well recently    Blood pressure control:  BP Readings from Last 3 Encounters:   02/26/18 104/60   02/02/18 108/61   01/08/18  122/80       Lab Results   Component Value Date    LABMICR 1.28 09/03/2014       Lab Results   Component Value Date    LDLCALC 88 10/24/2017     HTN    Does patient check BP regularly at home? - No  Current Medication regimen - Lisinopril, Metoprolol, Imdur  Tolerating medications well? - yes    Shortness of breath or chest pain? No  Headache or visual complaints? No  Neurologic changes like confusion? No  Extremity edema? No    BP Readings from Last 3 Encounters:   02/26/18 104/60   02/02/18 108/61   01/08/18 122/80           Health Maintenance   Topic Date Due   ??? Diabetic retinal exam  06/11/1964   ??? DTaP/Tdap/Td vaccine (1 - Tdap) 06/11/1965   ??? HIV screen  06/11/1969   ??? Low dose CT lung screening  06/11/2009   ??? Diabetic foot exam  07/14/2016   ??? Shingles Vaccine (2 of 2) 01/10/2018   ??? Diabetic microalbuminuria test  04/28/2018   ??? A1C test (Diabetic or Prediabetic)  10/25/2018   ??? Lipid screen  10/25/2018   ??? Potassium monitoring  10/25/2018   ??? Creatinine monitoring  10/25/2018   ??? Annual Wellness Visit (AWV)  11/15/2018   ??? Colon cancer screen colonoscopy  12/20/2022   ??? Flu vaccine  Completed   ??? Pneumococcal 0-64 years Vaccine  Completed   ??? Hepatitis C screen  Completed       Past Medical History:   Diagnosis Date   ??? Arthritis     general   ??? CAD (coronary artery disease)     s/p stent placement-sees Dr. Gabrielle DareBaki   ??? Chronic back pain    ??? Diabetes mellitus (HCC)    ??? GERD (gastroesophageal reflux disease)    ??? Hyperlipidemia    ??? Hypertension        Past Surgical History:   Procedure Laterality Date   ??? [place]   ??? COLONOSCOPY Left 12/19/2017    COLONOSCOPY POLYPECTOMY HOT BIOPSY performed by Florian BuffMichael T Sheehan, MD at [facility]   ??? CORONARY ARTERY BYPASS GRAFT  2009    [place]   ??? DIAGNOSTIC CARDIAC CATH LAB PROCEDURE     ??? ORTHOPEDIC SURGERY Left    ??? PTCA  07/04/2016   ??? TONSILLECTOMY         Social History     Tobacco Use   ??? Smoking status:  Former Smoker     Packs/day: 1.00     Years: 35.00     Pack years: 35.00     Types: Cigarettes     Last attempt to quit: 05/24/2008     Years since quitting: 9.7   ??? Smokeless tobacco: Never Used   Substance Use Topics   ??? Alcohol use: Yes     Alcohol/week: 1.0 standard drinks     Types: 1 Glasses of wine per week     Comment: once monthly stopped a over 2 years   ??? Drug use: No       Family History   Problem Relation Age of Onset   ??? Cancer Mother    ??? Heart Disease Brother    ??? Stroke Brother    ??? Diabetes Brother    ??? Early Death Brother    ??? High Blood Pressure Brother    ??? High Cholesterol Brother    ??? Mental Retardation Sister          I have reviewed the patient's past medical history, past surgical history, allergies, medications, social and family history and I have made updates where appropriate.    Review of Systems        PHYSICAL EXAM:  BP 104/60    Pulse 80    Temp 98.1 ??F (36.7 ??C) (Oral)    Resp 12    Ht 5' 9.5" (1.765 m)    Wt 208 lb (94.3 kg)    BMI 30.28 kg/m??     General Appearance: well developed and well- nourished, in no acute distress  Head: normocephalic and atraumatic  ENT: external ear and ear canal normal bilaterally, nose without deformity, nasal mucosa  and turbinates normal without polyps, oropharynx normal, dentition is normal for age, no lipor gum lesions noted  Neck: supple and non-tender without mass, no thyromegaly or thyroid nodules, no cervical lymphadenopathy  Pulmonary/Chest: clear to auscultation bilaterally- no wheezes, rales or rhonchi, normal air movement, no respiratory distress or retractions  Cardiovascular: normal rate, regular rhythm, normal S1 and S2, no murmurs, rubs, clicks, or gallops, distal pulses intact  Extremities: no cyanosis, clubbing or edema of the lower extremities  Psych:  Normal affect without evidence of depression oranxiety, insight and judgement are appropriate, memory appears intact  Skin: warm and dry, no rash or erythema      ASSESSMENT &  PLAN  Rashee was seen today for follow-up.    Diagnoses and all orders for this visit:    Type 2 diabetes mellitus without complication, without long-term current use of insulin (HCC)  -     POCT glycosylated hemoglobin (Hb A1C)  -     POCT microalbumin    GAD (generalized anxiety disorder)    Chronic bilateral low back pain with bilateral sciatica    Chronic non-seasonal allergic rhinitis  -     fluticasone (FLONASE) 50 MCG/ACT nasal spray; 2 sprays by Nasal route daily    -Will start flonase for nasal congestion  -Other Chronic issues stable, continue current medications  -Advised to call if any issues        Return in about 6 months (around 08/27/2018), or if symptoms worsen or fail to improve.    Controlled Substances Monitoring:                     Srikrishna received counseling on the following healthy behaviors: medication adherence  Reviewed prior labs and health maintenance.  Continue current medications, diet and exercise.  Discussed use, benefit, and side effects of prescribed medications. Barriers to medication compliance addressed.   Patient given educational materials - see patient instructions.    All patient questions answered.  Patient voiced understanding.

## 2018-02-26 NOTE — Patient Instructions (Signed)
You may receive a survey about your visit with us today. The feedback from our patients helps us identify what is working well and where the service to all patients can be enhanced. Thank you!

## 2018-03-07 NOTE — Telephone Encounter (Signed)
Pt came to the ofc w/the attached letter & pamphlet from his pharmacy stating he needs a similar letter from his PCP on a letterhead.  The letter needs to state he needs to have someone w/him or they will need to have access to him in the event he needs to have his Naloxone administered & he is unable to do it himself.   Pt would like someone to call when the letter is ready & he will come back to pick both letters up.  Please advise

## 2018-03-07 NOTE — Telephone Encounter (Signed)
Printed

## 2018-03-07 NOTE — Telephone Encounter (Signed)
Pt informed and verbalized understanding.  Letter placed at the front desk

## 2018-03-08 ENCOUNTER — Encounter

## 2018-03-08 MED ORDER — OXYCODONE-ACETAMINOPHEN 10-325 MG PO TABS
10-325 MG | ORAL_TABLET | Freq: Three times a day (TID) | ORAL | 0 refills | Status: DC | PRN
Start: 2018-03-08 — End: 2018-04-04

## 2018-03-08 MED ORDER — DIAZEPAM 5 MG PO TABS
5 MG | ORAL_TABLET | Freq: Three times a day (TID) | ORAL | 0 refills | Status: DC | PRN
Start: 2018-03-08 — End: 2018-04-04

## 2018-03-08 NOTE — Telephone Encounter (Signed)
Micheal [name] called requesting a refill on the following medications:  Requested Prescriptions     Pending Prescriptions Disp Refills   ??? diazepam (VALIUM) 5 MG tablet 90 tablet 0     Sig: Take 1 tablet by mouth every 8 hours as needed for Anxiety for up to 30 days.   ??? oxyCODONE-acetaminophen (PERCOCET) 10-325 MG per tablet 90 tablet 0     Sig: Take 1 tablet by mouth every 8 hours as needed for Pain for up to 30 days.     [facility]      Date of last visit: 02/26/18  Date of next visit (if applicable): 08/27/2018

## 2018-03-19 MED ORDER — BRILINTA 90 MG PO TABS
90 MG | ORAL_TABLET | ORAL | 2 refills | Status: DC
Start: 2018-03-19 — End: 2018-11-07

## 2018-03-19 MED ORDER — ATORVASTATIN CALCIUM 80 MG PO TABS
80 MG | ORAL_TABLET | ORAL | 2 refills | Status: DC
Start: 2018-03-19 — End: 2018-11-01

## 2018-04-04 ENCOUNTER — Encounter

## 2018-04-04 MED ORDER — DIAZEPAM 5 MG PO TABS
5 MG | ORAL_TABLET | Freq: Three times a day (TID) | ORAL | 0 refills | Status: DC | PRN
Start: 2018-04-04 — End: 2018-05-03

## 2018-04-04 MED ORDER — OXYCODONE-ACETAMINOPHEN 10-325 MG PO TABS
10-325 MG | ORAL_TABLET | Freq: Three times a day (TID) | ORAL | 0 refills | Status: DC | PRN
Start: 2018-04-04 — End: 2018-05-09

## 2018-04-04 NOTE — Telephone Encounter (Signed)
Micheal [name] called requesting a refill on the following medications:  Requested Prescriptions     Pending Prescriptions Disp Refills   ??? diazePAM (VALIUM) 5 MG tablet 90 tablet 0     Sig: Take 1 tablet by mouth every 8 hours as needed for Anxiety for up to 30 days.   ??? oxyCODONE-acetaminophen (PERCOCET) 10-325 MG per tablet 90 tablet 0     Sig: Take 1 tablet by mouth every 8 hours as needed for Pain for up to 30 days.     Pharmacy verified:  [facility]      Date of last visit: 02/26/18  Date of next visit (if applicable): 08/27/2018

## 2018-04-25 ENCOUNTER — Encounter

## 2018-04-26 MED ORDER — OLOPATADINE HCL 0.1 % OP SOLN
0.1 % | Freq: Two times a day (BID) | OPHTHALMIC | 5 refills | Status: DC
Start: 2018-04-26 — End: 2019-10-03

## 2018-04-26 MED ORDER — HYDROCORTISONE 2.5 % EX CREA
2.5 % | CUTANEOUS | 2 refills | Status: DC
Start: 2018-04-26 — End: 2019-10-03

## 2018-05-03 ENCOUNTER — Encounter

## 2018-05-03 MED ORDER — DIAZEPAM 5 MG PO TABS
5 MG | ORAL_TABLET | Freq: Three times a day (TID) | ORAL | 0 refills | Status: DC | PRN
Start: 2018-05-03 — End: 2018-05-29

## 2018-05-09 ENCOUNTER — Encounter

## 2018-05-09 MED ORDER — OXYCODONE-ACETAMINOPHEN 10-325 MG PO TABS
10-325 MG | ORAL_TABLET | Freq: Three times a day (TID) | ORAL | 0 refills | Status: DC | PRN
Start: 2018-05-09 — End: 2018-06-06

## 2018-05-09 NOTE — Telephone Encounter (Signed)
[name] called requesting a refill on the following medications:  Requested Prescriptions     Pending Prescriptions Disp Refills   ??? oxyCODONE-acetaminophen (PERCOCET) 10-325 MG per tablet 90 tablet 0     Sig: Take 1 tablet by mouth every 8 hours as needed for Pain for up to 30 days.     Pharmacy verified:  .pv      Date of last visit:   Date of next visit (if applicable): 08/27/2018

## 2018-05-29 ENCOUNTER — Encounter

## 2018-05-29 MED ORDER — METOPROLOL SUCCINATE ER 25 MG PO TB24
25 MG | ORAL_TABLET | ORAL | 0 refills | Status: DC
Start: 2018-05-29 — End: 2018-05-29

## 2018-05-29 MED ORDER — METOPROLOL SUCCINATE ER 25 MG PO TB24
25 MG | ORAL_TABLET | ORAL | 0 refills | Status: DC
Start: 2018-05-29 — End: 2018-11-07

## 2018-05-29 MED ORDER — ISOSORBIDE MONONITRATE ER 30 MG PO TB24
30 MG | ORAL_TABLET | Freq: Every day | ORAL | 3 refills | Status: DC
Start: 2018-05-29 — End: 2019-05-06

## 2018-05-29 MED ORDER — DIAZEPAM 5 MG PO TABS
5 MG | ORAL_TABLET | Freq: Three times a day (TID) | ORAL | 0 refills | Status: DC | PRN
Start: 2018-05-29 — End: 2018-07-02

## 2018-05-29 NOTE — Telephone Encounter (Signed)
DUPLICATE

## 2018-05-29 NOTE — Telephone Encounter (Signed)
Pt apologized for the multiple requests.  He just started using mychart, said "I'll get better at this"

## 2018-05-29 NOTE — Telephone Encounter (Signed)
Micheal [name] called requesting a refill on the following medications:  Requested Prescriptions     Pending Prescriptions Disp Refills   ??? diazePAM (VALIUM) 5 MG tablet 90 tablet 0     Sig: Take 1 tablet by mouth every 8 hours as needed for Anxiety for up to 30 days.     Pharmacy verified:[facility] [place], [place]  .[facility]      Date of last visit: 02-26-18  Date of next visit (if applicable): 08/27/2018

## 2018-05-30 NOTE — Telephone Encounter (Signed)
Letter routed to pool

## 2018-05-30 NOTE — Telephone Encounter (Signed)
Letter printed and faxed to the number provided.

## 2018-05-30 NOTE — Telephone Encounter (Signed)
[facility] pharmacy in [place] is asking for documentation that it is ok for patient to take both diazepam and oxycodone, please fax to [phone]

## 2018-06-06 ENCOUNTER — Encounter

## 2018-06-06 MED ORDER — OXYCODONE-ACETAMINOPHEN 10-325 MG PO TABS
10-325 MG | ORAL_TABLET | Freq: Three times a day (TID) | ORAL | 0 refills | Status: DC | PRN
Start: 2018-06-06 — End: 2018-07-04

## 2018-06-06 NOTE — Telephone Encounter (Signed)
Micheal [name] called requesting a refill on the following medications:  Requested Prescriptions     Pending Prescriptions Disp Refills   ??? oxyCODONE-acetaminophen (PERCOCET) 10-325 MG per tablet 90 tablet 0     Sig: Take 1 tablet by mouth every 8 hours as needed for Pain for up to 30 days.     Pharmacy verified:  [facility] [address]    Date of last visit: 02/26/2018  Date of next visit (if applicable): 08/27/2018

## 2018-06-18 ENCOUNTER — Encounter

## 2018-06-19 ENCOUNTER — Encounter

## 2018-06-19 MED ORDER — METFORMIN HCL 500 MG PO TABS
500 MG | ORAL_TABLET | Freq: Two times a day (BID) | ORAL | 1 refills | Status: DC
Start: 2018-06-19 — End: 2019-03-05

## 2018-06-19 NOTE — Telephone Encounter (Signed)
[facility] [place], [place] requesting Metformin 500mg .

## 2018-06-20 ENCOUNTER — Encounter

## 2018-06-20 MED ORDER — METFORMIN HCL 1000 MG PO TABS
1000 MG | ORAL_TABLET | ORAL | 0 refills | Status: DC
Start: 2018-06-20 — End: 2018-12-04

## 2018-06-20 NOTE — Telephone Encounter (Signed)
Pt states he is currently in [place] taking care of a friend and needs the refill sent to [facility] there.

## 2018-07-02 ENCOUNTER — Encounter

## 2018-07-02 MED ORDER — DIAZEPAM 5 MG PO TABS
5 MG | ORAL_TABLET | Freq: Three times a day (TID) | ORAL | 0 refills | Status: DC | PRN
Start: 2018-07-02 — End: 2018-07-31

## 2018-07-02 NOTE — Telephone Encounter (Signed)
Medication pended for review

## 2018-07-02 NOTE — Telephone Encounter (Signed)
ASSESSMENT & PLAN   Diagnosis Orders   1. GAD (generalized anxiety disorder)  diazePAM (VALIUM) 5 MG tablet       OAARS reviewed and appropriate.     Controlled Substances Monitoring: Periodic Controlled Substance Monitoring: Possible medication side effects, risk of tolerance/dependence & alternative treatments discussed., No signs of potential drug abuse or diversion identified. Stark Klein, DO)      Future Appointments   Date Time Provider Department Center   08/27/2018 10:00 AM [name], DO [facility] [name]   11/07/2018 10:30 AM Zoheir Derl Barrow, MD [facility] [place]

## 2018-07-02 NOTE — Telephone Encounter (Signed)
Micheal [name] called requesting a refill on the following medications:  Requested Prescriptions   ??         Pending Prescriptions Disp Refills   ??? diazePAM (VALIUM) 5 MG tablet 90 tablet 0   ?? ?? Sig: Take 1 tablet by mouth every 8 hours as needed for Anxiety for up to 30 days.   ??  Pharmacy verified:[facility] [address]  [facility]  ??  ??  Date of last visit: 02-26-18  Date of next visit (if applicable): 08/27/2018      **Could not find in med list**

## 2018-07-04 ENCOUNTER — Encounter

## 2018-07-04 MED ORDER — OXYCODONE-ACETAMINOPHEN 10-325 MG PO TABS
10-325 MG | ORAL_TABLET | Freq: Three times a day (TID) | ORAL | 0 refills | Status: DC | PRN
Start: 2018-07-04 — End: 2018-07-31

## 2018-07-04 NOTE — Telephone Encounter (Signed)
Micheal [name] called requesting a refill on the following medications:  Requested Prescriptions     Pending Prescriptions Disp Refills   ??? oxyCODONE-acetaminophen (PERCOCET) 10-325 MG per tablet 90 tablet 0     Sig: Take 1 tablet by mouth every 8 hours as needed for Pain for up to 30 days.     Pharmacy verified: [facility] [address]      Date of last visit: 02/26/18  Date of next visit (if applicable): 08/27/2018

## 2018-07-31 ENCOUNTER — Encounter

## 2018-07-31 MED ORDER — DIAZEPAM 5 MG PO TABS
5 MG | ORAL_TABLET | Freq: Three times a day (TID) | ORAL | 0 refills | Status: DC | PRN
Start: 2018-07-31 — End: 2018-08-30

## 2018-07-31 MED ORDER — OXYCODONE-ACETAMINOPHEN 10-325 MG PO TABS
10-325 MG | ORAL_TABLET | Freq: Three times a day (TID) | ORAL | 0 refills | Status: DC | PRN
Start: 2018-07-31 — End: 2018-08-30

## 2018-07-31 NOTE — Telephone Encounter (Signed)
Micheal [name] called requesting a refill on the following medications:  Requested Prescriptions     Pending Prescriptions Disp Refills   ??? diazePAM (VALIUM) 5 MG tablet 90 tablet 0     Sig: Take 1 tablet by mouth every 8 hours as needed for Anxiety for up to 30 days.   ??? oxyCODONE-acetaminophen (PERCOCET) 10-325 MG per tablet 90 tablet 0     Sig: Take 1 tablet by mouth every 8 hours as needed for Pain for up to 30 days.     Pharmacy verified:  [facility]  [facility] [address]    Date of last visit: 02/26/2018  Date of next visit (if applicable): 08/27/2018

## 2018-07-31 NOTE — Telephone Encounter (Signed)
ASSESSMENT & PLAN   Diagnosis Orders   1. GAD (generalized anxiety disorder)  diazePAM (VALIUM) 5 MG tablet   2. Chronic bilateral low back pain with bilateral sciatica  oxyCODONE-acetaminophen (PERCOCET) 10-325 MG per tablet       OAARS reviewed and appropriate.     Controlled Substances Monitoring: Periodic Controlled Substance Monitoring: Possible medication side effects, risk of tolerance/dependence & alternative treatments discussed., No signs of potential drug abuse or diversion identified., Obtaining appropriate analgesic effect of treatment. Stark Klein, DO)      Future Appointments   Date Time Provider Department Center   08/27/2018 10:00 AM [name], DO Fam Med [facility] [name]   11/07/2018 10:30 AM Zoheir Derl Barrow, MD [facility] - [place]

## 2018-08-11 ENCOUNTER — Encounter

## 2018-08-13 MED ORDER — MONTELUKAST SODIUM 10 MG PO TABS
10 MG | ORAL_TABLET | ORAL | 3 refills | Status: DC
Start: 2018-08-13 — End: 2019-05-06

## 2018-08-13 MED ORDER — LISINOPRIL 40 MG PO TABS
40 MG | ORAL_TABLET | ORAL | 3 refills | Status: DC
Start: 2018-08-13 — End: 2019-08-05

## 2018-08-27 ENCOUNTER — Encounter: Attending: Family Medicine | Primary: Family Medicine

## 2018-08-30 ENCOUNTER — Encounter

## 2018-08-30 MED ORDER — OXYCODONE-ACETAMINOPHEN 10-325 MG PO TABS
10-325 MG | ORAL_TABLET | Freq: Three times a day (TID) | ORAL | 0 refills | Status: DC | PRN
Start: 2018-08-30 — End: 2018-09-27

## 2018-08-30 MED ORDER — DIAZEPAM 5 MG PO TABS
5 MG | ORAL_TABLET | Freq: Three times a day (TID) | ORAL | 0 refills | Status: DC | PRN
Start: 2018-08-30 — End: 2018-09-27

## 2018-08-30 NOTE — Telephone Encounter (Signed)
Micheal [name] called requesting a refill on the following medications:  Requested Prescriptions     Pending Prescriptions Disp Refills   ??? diazePAM (VALIUM) 5 MG tablet 90 tablet 0     Sig: Take 1 tablet by mouth every 8 hours as needed for Anxiety for up to 30 days.   ??? oxyCODONE-acetaminophen (PERCOCET) 10-325 MG per tablet 90 tablet 0     Sig: Take 1 tablet by mouth every 8 hours as needed for Pain for up to 30 days.     Pharmacy verified: [facility]  .pv      Date of last visit: 02/26/2018  Date of next visit (if applicable): 09/03/2018

## 2018-08-30 NOTE — Telephone Encounter (Signed)
Both medications were filled in July. Please advise if refills are appropriate.

## 2018-09-03 ENCOUNTER — Ambulatory Visit: Admit: 2018-09-03 | Discharge: 2018-09-03 | Payer: MEDICARE | Attending: Family Medicine | Primary: Family Medicine

## 2018-09-03 DIAGNOSIS — E119 Type 2 diabetes mellitus without complications: Secondary | ICD-10-CM

## 2018-09-03 LAB — POCT GLYCOSYLATED HEMOGLOBIN (HGB A1C): Hemoglobin A1C: 8.4 % — ABNORMAL HIGH (ref 4.3–5.7)

## 2018-09-03 MED ORDER — PREGABALIN 50 MG PO CAPS
50 MG | ORAL_CAPSULE | Freq: Three times a day (TID) | ORAL | 3 refills | Status: DC
Start: 2018-09-03 — End: 2019-01-14

## 2018-09-03 MED ORDER — GLIMEPIRIDE 4 MG PO TABS
4 MG | ORAL_TABLET | ORAL | 3 refills | Status: DC
Start: 2018-09-03 — End: 2019-09-29

## 2018-09-03 NOTE — Progress Notes (Signed)
Micheal BirminghamDavid A [name] is a 63 y.o. male that presents for     Chief Complaint   Patient presents with   ??? Back Pain     worse, cant get out of bed sometimes, down leg   ??? Anxiety     no better       BP 126/72    Pulse 67    Temp 97.8 ??F (36.6 ??C)    Resp 16    Ht 5\' 8"  (1.727 m)    Wt 227 lb 14.4 oz (103.4 kg)    SpO2 97%    BMI 34.65 kg/m??       HPI:      Chronic Pain    HPI:    Patient has chronic pain of the low back.  States that his pain is worse since his surgery.  States that his surgeon told him that he has 'nerve damage'.  He is having pain radiating down both posterior legs and has difficulty walking due to this.    Pain control: inadequate  Improvement in function and ADLs?  yes  Current Medications:  Percocet q 8 hrs  Escalation of dosage recently?  no    Evidence for abuse or diversion? no   Seen specialist or pain clinic?: yes      Controlled Substances Monitoring:        Diabetes Type 2    Glucose control:   Does patient check blood glucoses at home?  No, thinks that his a1c is elevated from decreased activity overall.  Report of hypoglycemia: no  Lab Results   Component Value Date    LABA1C 8.4 (H) 09/03/2018     No results found for: EAG    Symptoms  Polyuria, Polydipsia or Polyphagia?   No  Chest Pain, SOB, or Palpitations? -  No  New Vision complaints? No  Paresthesias of the extremities?  No    Medications  Current medication were reviewed.  Compliant with medications?  yes  Medication side effects?  Yes, notes diarrhea with the Metformin  On ACE-I or ARB?  Yes  On antiplatelet therapy?  Yes  On Statin?  Yes      Exercise  Exercise? No  Wt Readings from Last 3 Encounters:   09/03/18 227 lb 14.4 oz (103.4 kg)   02/26/18 208 lb (94.3 kg)   02/02/18 211 lb (95.7 kg)       Diet discipline?:  Low salt, fat, sugar diet?  'I'm picking and eating'    Blood pressure control:  BP Readings from Last 3 Encounters:   09/03/18 126/72   02/26/18 104/60   02/02/18 108/61       Lab Results   Component Value Date     LABMICR 1.28 09/03/2014       Lab Results   Component Value Date    LDLCALC 88 10/24/2017     HTN    Does patient check BP regularly at home? - No  Current Medication regimen - Lisinopril, Metoprolol, Imdur  Tolerating medications well? - yes    Shortness of breath or chest pain? No  Headache or visual complaints? No  Neurologic changes like confusion? No  Extremity edema? No    BP Readings from Last 3 Encounters:   09/03/18 126/72   02/26/18 104/60   02/02/18 108/61           Health Maintenance   Topic Date Due   ??? Diabetic retinal exam  06/11/1964   ??? HIV screen  06/11/1969   ??? DTaP/Tdap/Td vaccine (1 - Tdap) 06/11/1973   ??? Diabetic foot exam  07/14/2016   ??? Shingles Vaccine (2 of 2) 01/10/2018   ??? Flu vaccine (1) 09/25/2018   ??? Lipid screen  10/25/2018   ??? Potassium monitoring  10/25/2018   ??? Creatinine monitoring  10/25/2018   ??? Annual Wellness Visit (AWV)  11/16/2018   ??? Diabetic microalbuminuria test  02/27/2019   ??? A1C test (Diabetic or Prediabetic)  09/03/2019   ??? Colon cancer screen colonoscopy  12/20/2022   ??? Pneumococcal 0-64 years Vaccine  Completed   ??? Hepatitis C screen  Completed   ??? Hepatitis A vaccine  Aged Out   ??? Hib vaccine  Aged Out   ??? Meningococcal (ACWY) vaccine  Aged Out       Past Medical History:   Diagnosis Date   ??? Arthritis     general   ??? CAD (coronary artery disease)     s/p stent placement-sees Dr. Gabrielle DareBaki   ??? Chronic back pain    ??? Diabetes mellitus (HCC)    ??? GERD (gastroesophageal reflux disease)    ??? Hyperlipidemia    ??? Hypertension        Past Surgical History:   Procedure Laterality Date   ??? [place]   ??? COLONOSCOPY Left 12/19/2017    COLONOSCOPY POLYPECTOMY HOT BIOPSY performed [facility] Florian BuffMichael T Sheehan[facility]   ??? CORONARY ARTERY BYPASS GRAFT  2009    [place]   ??? DIAGNOSTIC CARDIAC CATH LAB PROCEDURE     ??? ORTHOPEDIC SURGERY Left    ??? PTCA  07/04/2016   ??? TONSILLECTOMY         Social History     Tobacco Use   ???  Smoking status: Former Smoker     Packs/day: 1.00     Years: 15.00     Pack years: 15.00     Types: Cigarettes     Last attempt to quit: 05/24/2008     Years since quitting: 10.2   ??? Smokeless tobacco: Never Used   Substance Use Topics   ??? Alcohol use: Yes     Alcohol/week: 1.0 standard drinks     Types: 1 Glasses of wine per week     Comment: once monthly stopped a over 2 years   ??? Drug use: No       Family History   Problem Relation Age of Onset   ??? Cancer Mother    ??? Heart Disease Brother    ??? Stroke Brother    ??? Diabetes Brother    ??? Early Death Brother    ??? High Blood Pressure Brother    ??? High Cholesterol Brother    ??? Mental Retardation Sister          I have reviewed the patient's past medical history, past surgical history, allergies, medications, social and family history and I have made updates where appropriate.    Review of Systems        PHYSICAL EXAM:  BP 126/72    Pulse 67    Temp 97.8 ??F (36.6 ??C)    Resp 16    Ht 5\' 8"  (1.727 m)    Wt 227 lb 14.4 oz (103.4 kg)    SpO2 97%    BMI 34.65 kg/m??     General Appearance: well developed and well- nourished, in no acute distress  Head: normocephalic and  atraumatic  ENT: external ear and ear canal normal bilaterally, nose without deformity, nasal mucosa and turbinates normal without polyps, oropharynx normal, dentition is normal for age, no lipor gum lesions noted  Neck: supple and non-tender without mass, no thyromegaly or thyroid nodules, no cervical lymphadenopathy  Pulmonary/Chest: clear to auscultation bilaterally- no wheezes, rales or rhonchi, normal air movement, no respiratory distress or retractions  Cardiovascular: normal rate, regular rhythm, normal S1 and S2, no murmurs, rubs, clicks, or gallops, distal pulses intact  Extremities: no cyanosis, clubbing or edema of the lower extremities  Psych:  Normal affect without evidence of depression oranxiety, insight and judgement are appropriate, memory appears intact  Skin: warm and dry, no rash or  erythema      ASSESSMENT & PLAN  Lamont was seen today for back pain and anxiety.    Diagnoses and all orders for this visit:    Type 2 diabetes mellitus without complication, without long-term current use of insulin (HCC)  -     glimepiride (AMARYL) 4 MG tablet; TAKE 1 TABLET BID    Need for shingles vaccine  -     Zoster recombinant [facility])    Essential hypertension    Chronic bilateral low back pain with bilateral sciatica  -     pregabalin (LYRICA) 50 MG capsule; Take 1 capsule by mouth 3 times daily for 90 days.    Screening for HIV (human immunodeficiency virus)  -     HIV Screen; Future    Exposure to COVID-19 virus  -     Covid-19, Antibody, Total; Future    Other orders  -     POCT glycosylated hemoglobin (Hb A1C)    -back pain uncontrolled, will trial adding lyrica  -DM2 uncontrolled, likely related to weight gain and decreased activity level.  Will increase amaryl to BID, encouraged on trying to increase activity as tolerated  -Other Chronic issues stable, continue current medications  -Advised to call if any issues        Return in about 3 months (around 12/04/2018).    Controlled Substances Monitoring:                     Brendyn received counseling on the following healthy behaviors: medication adherence  Reviewed prior labs and health maintenance.  Continue current medications, diet and exercise.  Discussed use, benefit, and side effects of prescribed medications. Barriers to medication compliance addressed.   Patient given educational materials - see patient instructions.    All patient questions answered.  Patient voiced understanding.

## 2018-09-27 ENCOUNTER — Encounter

## 2018-09-27 MED ORDER — OXYCODONE-ACETAMINOPHEN 10-325 MG PO TABS
10-325 MG | ORAL_TABLET | Freq: Three times a day (TID) | ORAL | 0 refills | Status: DC | PRN
Start: 2018-09-27 — End: 2018-10-25

## 2018-09-27 MED ORDER — DIAZEPAM 5 MG PO TABS
5 MG | ORAL_TABLET | Freq: Three times a day (TID) | ORAL | 0 refills | Status: DC | PRN
Start: 2018-09-27 — End: 2018-10-25

## 2018-09-27 NOTE — Telephone Encounter (Signed)
Recent Visits  Date Type Provider Dept   09/03/18 Office Visit [name], DO [facility] [place] [facility]   02/26/18 Office Visit [name], DO [facility]   02/02/18 Office Visit [name], DO [facility]   11/15/17 Office Visit [name], DO [facility]   10/24/17 Office Visit [name], DO [facility]   04/27/17 Office Visit [name], DO [facility]   Showing recent visits within past 540 days with a meds authorizing provider and meeting all other requirements     Future Appointments  Date Type Provider Dept   12/04/18 Appointment [name], DO [facility] [place] [facility]   Showing future appointments within next 150 days with a meds authorizing provider and meeting all other requirements     Future Appointments   Date Time Provider Department Center   11/07/2018 10:30 AM Zoheir Derl Barrow, MD [facility] [place]   12/04/2018 12:40 PM [name], DO [place] [facility] - [name]

## 2018-09-27 NOTE — Telephone Encounter (Signed)
Micheal [name] called requesting a refill on the following medications:  Requested Prescriptions     Pending Prescriptions Disp Refills   ??? diazePAM (VALIUM) 5 MG tablet 90 tablet 0     Sig: Take 1 tablet by mouth every 8 hours as needed for Anxiety for up to 30 days.   ??? oxyCODONE-acetaminophen (PERCOCET) 10-325 MG per tablet 90 tablet 0     Sig: Take 1 tablet by mouth every 8 hours as needed for Pain for up to 30 days.     Pharmacy verified: [facility] on [address]  .pv      Date of last visit: 09/03/2018  Date of next visit (if applicable): Visit date not found

## 2018-10-03 ENCOUNTER — Encounter

## 2018-10-03 MED ORDER — FLUTICASONE PROPIONATE 50 MCG/ACT NA SUSP
50 MCG/ACT | NASAL | 3 refills | Status: DC
Start: 2018-10-03 — End: 2019-01-24

## 2018-10-22 ENCOUNTER — Encounter

## 2018-10-22 MED ORDER — ESOMEPRAZOLE MAGNESIUM 40 MG PO CPDR
40 MG | ORAL_CAPSULE | ORAL | 0 refills | Status: DC
Start: 2018-10-22 — End: 2019-01-14

## 2018-10-22 NOTE — Telephone Encounter (Signed)
Sent  Electronically signed by Jone Baseman, APRN - CNP on 10/22/2018 at 4:41 PM

## 2018-10-24 ENCOUNTER — Encounter

## 2018-10-24 MED ORDER — PAROXETINE HCL 40 MG PO TABS
40 MG | ORAL_TABLET | Freq: Every day | ORAL | 3 refills | Status: DC
Start: 2018-10-24 — End: 2018-11-02

## 2018-10-24 MED ORDER — FENOFIBRATE 145 MG PO TABS
145 MG | ORAL_TABLET | ORAL | 3 refills | Status: DC
Start: 2018-10-24 — End: 2019-10-17

## 2018-10-25 ENCOUNTER — Encounter

## 2018-10-25 MED ORDER — DIAZEPAM 5 MG PO TABS
5 MG | ORAL_TABLET | Freq: Three times a day (TID) | ORAL | 0 refills | Status: DC | PRN
Start: 2018-10-25 — End: 2018-11-26

## 2018-10-25 MED ORDER — OXYCODONE-ACETAMINOPHEN 10-325 MG PO TABS
10-325 MG | ORAL_TABLET | Freq: Three times a day (TID) | ORAL | 0 refills | Status: DC | PRN
Start: 2018-10-25 — End: 2018-11-26

## 2018-10-25 NOTE — Telephone Encounter (Signed)
[name] called requesting a refill on the following medications:  Requested Prescriptions     Pending Prescriptions Disp Refills   ??? oxyCODONE-acetaminophen (PERCOCET) 10-325 MG per tablet 90 tablet 0     Sig: Take 1 tablet by mouth every 8 hours as needed for Pain for up to 30 days.   ??? diazePAM (VALIUM) 5 MG tablet 90 tablet 0     Sig: Take 1 tablet by mouth every 8 hours as needed for Anxiety for up to 30 days.     Pharmacy verified:  YES  .pv       Date of last visit: 8.10.20  Date of next visit (if applicable): 11.10.20

## 2018-11-01 MED ORDER — ATORVASTATIN CALCIUM 80 MG PO TABS
80 MG | ORAL_TABLET | ORAL | 0 refills | Status: DC
Start: 2018-11-01 — End: 2018-11-07

## 2018-11-02 ENCOUNTER — Encounter

## 2018-11-02 MED ORDER — PAROXETINE HCL 40 MG PO TABS
40 MG | ORAL_TABLET | ORAL | 3 refills | Status: DC
Start: 2018-11-02 — End: 2020-02-13

## 2018-11-07 ENCOUNTER — Ambulatory Visit
Admit: 2018-11-07 | Discharge: 2018-11-07 | Payer: MEDICARE | Attending: Cardiovascular Disease | Primary: Family Medicine

## 2018-11-07 DIAGNOSIS — I25810 Atherosclerosis of coronary artery bypass graft(s) without angina pectoris: Secondary | ICD-10-CM

## 2018-11-07 MED ORDER — NITROGLYCERIN 0.4 MG SL SUBL
0.4 | ORAL_TABLET | SUBLINGUAL | 2 refills | 8.00000 days | Status: DC | PRN
Start: 2018-11-07 — End: 2023-08-22

## 2018-11-07 MED ORDER — METOPROLOL SUCCINATE ER 25 MG PO TB24
25 MG | ORAL_TABLET | ORAL | 3 refills | Status: DC
Start: 2018-11-07 — End: 2019-05-06

## 2018-11-07 MED ORDER — ATORVASTATIN CALCIUM 80 MG PO TABS
80 MG | ORAL_TABLET | ORAL | 3 refills | Status: DC
Start: 2018-11-07 — End: 2019-12-12

## 2018-11-07 MED ORDER — TICAGRELOR 90 MG PO TABS
90 MG | ORAL_TABLET | ORAL | 3 refills | Status: DC
Start: 2018-11-07 — End: 2019-12-10

## 2018-11-07 NOTE — Patient Instructions (Signed)
You may receive a survey regarding the care you received during your visit.  Your input is valuable to us.  We encourage you to complete and return your survey.  We hope you will choose us in the future for your healthcare needs.

## 2018-11-07 NOTE — Progress Notes (Signed)
[address]  [place] [address]  Dept: [phone]  Dept Fax: [phone]  Loc: [phone]    Visit Date: 11/07/2018    Micheal [name] is a 63 y.o. male who presents todayfor:  Chief Complaint   Patient presents with   ??? 1 Year Follow Up   ??? Coronary Artery Disease   ??? Check-Up   ??? Hypertension     Known CABG and stents  Some back issues  Still with pain after surgery   Limited patient  Severe obesity   No chest pain   No changes in breathing  BP is stable   No dizziness  No symptoms      HPI:  HPI  Past Medical History:   Diagnosis Date   ??? Arthritis     general   ??? CAD (coronary artery disease)     s/p stent placement-sees Dr. Irene Limbo   ??? Chronic back pain    ??? Diabetes mellitus ([place])    ??? GERD (gastroesophageal reflux disease)    ??? Hyperlipidemia    ??? Hypertension       Past Surgical History:   Procedure Laterality Date   ??? COLONOSCOPY  2015    [place]   ??? COLONOSCOPY Left 12/19/2017    COLONOSCOPY POLYPECTOMY HOT BIOPSY performed by Earmon Phoenix, MD at [facility]   ??? CORONARY ARTERY BYPASS GRAFT  2009    [address] [place]    ??? DIAGNOSTIC CARDIAC CATH LAB PROCEDURE     ??? ORTHOPEDIC SURGERY Left    ??? PTCA  07/04/2016   ??? TONSILLECTOMY       Family History   Problem Relation Age of Onset   ??? Cancer Mother    ??? Heart Disease Brother    ??? Stroke Brother    ??? Diabetes Brother    ??? Early Death Brother    ??? High Blood Pressure Brother    ??? High Cholesterol Brother    ??? Mental Retardation Sister      Social History     Tobacco Use   ??? Smoking status: Former Smoker     Packs/day: 1.00     Years: 15.00     Pack years: 15.00     Types: Cigarettes     Last attempt to quit: 05/24/2008     Years since quitting: 10.4   ??? Smokeless tobacco: Never Used   Substance Use Topics   ??? Alcohol use: Yes     Alcohol/week: 1.0 standard drinks     Types: 1 Glasses of wine per week     Comment: once monthly stopped a over 2 years       Current Outpatient Medications   Medication Sig Dispense Refill   ??? PARoxetine (PAXIL) 40 MG tablet Take 1 tablet by mouth once daily 90 tablet 3   ??? atorvastatin (LIPITOR) 80 MG tablet Take 1 tablet by mouth nightly 90 tablet 0   ??? oxyCODONE-acetaminophen (PERCOCET) 10-325 MG per tablet Take 1 tablet by mouth every 8 hours as needed for Pain for up to 30 days. 90 tablet 0   ??? diazePAM (VALIUM) 5 MG tablet Take 1 tablet by mouth every 8 hours as needed for Anxiety for up to 30 days. 90 tablet 0   ??? fenofibrate (TRICOR) 145 MG tablet TAKE 1 TABLET BY MOUTH DAILY 90 tablet 3   ??? esomeprazole (NEXIUM)  40 MG delayed release capsule TAKE 1 CAPSULE BY MOUTH ONCE DAILY IN THE MORNING BEFORE BREAKFAST 90 capsule 0   ??? fluticasone (FLONASE) 50 MCG/ACT nasal spray Use 2 spray(s) in each nostril once daily 16 g 3   ??? pregabalin (LYRICA) 50 MG capsule Take 1 capsule by mouth 3 times daily for 90 days. 90 capsule 3   ??? glimepiride (AMARYL) 4 MG tablet TAKE 1 TABLET BID 180 tablet 3   ??? montelukast (SINGULAIR) 10 MG tablet Take 1 tablet by mouth once daily 90 tablet 3   ??? lisinopril (PRINIVIL;ZESTRIL) 40 MG tablet Take 1 tablet by mouth once daily 90 tablet 3   ??? metFORMIN (GLUCOPHAGE) 1000 MG tablet TAKE 1 TABLET BY MOUTH TWICE DAILY WITH MEALS 180 tablet 0   ??? metFORMIN (GLUCOPHAGE) 500 MG tablet Take 1 tablet by mouth 2 times daily (with meals) 180 tablet 1   ??? isosorbide mononitrate (IMDUR) 30 MG extended release tablet Take 1 tablet by mouth daily 90 tablet 3   ??? metoprolol succinate (TOPROL XL) 25 MG extended release tablet TAKE 1 TABLET BY MOUTH ONCE DAILY 90 tablet 0   ??? olopatadine (PATANOL) 0.1 % ophthalmic solution Place 1 drop into both eyes 2 times daily 1 Bottle 5   ??? hydrocortisone 2.5 % cream Apply to external ear BID PRN 1 Tube 2   ??? BRILINTA 90 MG TABS tablet TAKE 1 TABLET BY MOUTH TWICE DAILY 180 tablet 2   ??? ipratropium (ATROVENT) 0.03 % nasal spray 2 sprays by Nasal route 3 times daily 1 Bottle 5   ???  tamsulosin (FLOMAX) 0.4 MG capsule Take 1 capsule by mouth nightly 90 capsule 3   ??? nitroGLYCERIN (NITROSTAT) 0.4 MG SL tablet Place 1 tablet under the tongue every 5 minutes as needed for Chest pain 25 tablet 2   ??? Handicap Placard MISC by Does not apply route Expires 03/01/2020 1 each 0   ??? Multiple Vitamins-Minerals (CENTRUM SILVER ULTRA MENS) TABS Take 1 tablet by mouth daily       No current facility-administered medications for this visit.      Allergies   Allergen Reactions   ??? Gabapentin      Health Maintenance   Topic Date Due   ??? Diabetic retinal exam  06/11/1964   ??? HIV screen  06/11/1969   ??? DTaP/Tdap/Td vaccine (1 - Tdap) 06/11/1973   ??? Diabetic foot exam  07/14/2016   ??? Flu vaccine (1) 09/25/2018   ??? Potassium monitoring  10/25/2018   ??? Creatinine monitoring  10/25/2018   ??? Lipid screen  10/25/2018   ??? Annual Wellness Visit (AWV)  11/16/2018   ??? Diabetic microalbuminuria test  02/27/2019   ??? A1C test (Diabetic or Prediabetic)  09/03/2019   ??? Colon cancer screen colonoscopy  12/20/2022   ??? Shingles Vaccine  Completed   ??? Pneumococcal 0-64 years Vaccine  Completed   ??? Hepatitis C screen  Completed   ??? Hepatitis A vaccine  Aged Out   ??? Hib vaccine  Aged Out   ??? Meningococcal (ACWY) vaccine  Aged Out       Subjective:  Review of Systems  General:   No fever, no chills, No fatigue or weight loss  Pulmonary:    No dyspnea, no wheezing  Cardiac:    Denies recent chest pain,   GI:     No nausea or vomiting, no abdominal pain  Neuro:    No dizziness or light headedness,  Musculoskeletal:  No recent active issues  Extremities:   No edema, no obvious claudication       Objective:  Physical Exam  BP 126/85    Pulse 80    Ht 5\' 7"  (1.702 m)    Wt 231 lb 6.4 oz (105 kg)    BMI 36.24 kg/m??   General:   Well developed, well nourished  Lungs:   Clear to auscultation  Heart:    Normal S1 S2, Slight murmur. no rubs, no gallops  Abdomen:   Soft, non tender, no organomegalies, positive bowel sounds  Extremities:   No edema,  no cyanosis, good peripheral pulses  Neurological:   Awake, alert, oriented. No obvious focal deficits  Musculoskelatal:  No obvious deformities    Assessment:      Diagnosis Orders   1. Coronary artery disease involving coronary bypass graft of native heart without angina pectoris  EKG 12 Lead   2. Essential hypertension  EKG 12 Lead   3. Coronary artery disease involving native coronary artery of native heart with unstable angina pectoris (HCC)  nitroGLYCERIN (NITROSTAT) 0.4 MG SL tablet   4. Familial hypercholesterolemia     cardiac fair for now   ECG in office was done today. I reviewed the ECG. No acute findings      Plan:  No follow-ups on file.  As above  Continue risk factor modification and medical management  Thank you for allowing me to participate in the care of your patient. Please don't hesitate to contact me regarding any further issues related to the patient care    Orders Placed:  Orders Placed This Encounter   Procedures   ??? EKG 12 Lead     Order Specific Question:   Reason for Exam?     Answer:   Other       Medications Prescribed:  No orders of the defined types were placed in this encounter.         Discussed use, benefit, and side effects of prescribed medications. All patient questions answered. Pt voicedunderstanding. Instructed to continue current medications, diet and exercise. Continue risk factor modification and medical management. Patient agreed with treatment plan. Follow up as directed.    Electronically signedby , MD on 11/07/2018 at 10:50 AM

## 2018-11-26 ENCOUNTER — Encounter

## 2018-11-26 MED ORDER — OXYCODONE-ACETAMINOPHEN 10-325 MG PO TABS
10-325 MG | ORAL_TABLET | Freq: Three times a day (TID) | ORAL | 0 refills | Status: DC | PRN
Start: 2018-11-26 — End: 2018-12-25

## 2018-11-26 MED ORDER — DIAZEPAM 5 MG PO TABS
5 MG | ORAL_TABLET | Freq: Three times a day (TID) | ORAL | 0 refills | Status: DC | PRN
Start: 2018-11-26 — End: 2018-12-25

## 2018-11-26 NOTE — Telephone Encounter (Signed)
Micheal [name] called requesting a refill on the following medications:  Requested Prescriptions     Pending Prescriptions Disp Refills   ??? diazePAM (VALIUM) 5 MG tablet 90 tablet 0     Sig: Take 1 tablet by mouth every 8 hours as needed for Anxiety for up to 30 days.   ??? oxyCODONE-acetaminophen (PERCOCET) 10-325 MG per tablet 90 tablet 0     Sig: Take 1 tablet by mouth every 8 hours as needed for Pain for up to 30 days.     [facility]   .[facility]      Date of last visit:   Date of next visit (if applicable): Visit date not found

## 2018-12-04 ENCOUNTER — Ambulatory Visit: Admit: 2018-12-04 | Discharge: 2018-12-04 | Payer: MEDICARE | Attending: Family Medicine | Primary: Family Medicine

## 2018-12-04 DIAGNOSIS — Z0000 Encounter for general adult medical examination without abnormal findings: Secondary | ICD-10-CM

## 2018-12-04 LAB — POCT GLYCOSYLATED HEMOGLOBIN (HGB A1C): Hemoglobin A1C: 9.1 % — ABNORMAL HIGH (ref 4.3–5.7)

## 2018-12-04 NOTE — Progress Notes (Signed)
Micheal BirminghamDavid A [name] is a 63 y.o. male that presents for     Chief Complaint   Patient presents with   ??? Medicare AWV   ??? Discuss Medications     pt requesting to discuss the 2 nasal sprays, states he is only taking 1 and wants to know if he is to take both    ??? Flu Vaccine   ??? Health Maintenance     due for DM eye and foot exam        BP 118/80    Pulse 84    Temp 99 ??F (37.2 ??C) (Oral)    Resp 14    Ht 5\' 7"  (1.702 m)    Wt 231 lb (104.8 kg)    SpO2 100%    BMI 36.18 kg/m??       HPI:      Chronic Pain    HPI:    Patient has chronic pain of the low back.   States that his pain is worse overall since he had the surgery.  States that the surgeon feels like his symptoms more related to radiculopathy at times.  He has difficulty walking at times.  He is declining pain management referral.  Pain control: inadequate, but does feel like the percocet does help to 'take the edge off'  Improvement in function and ADLs?  yes  Current Medications:  Percocet q 8 hrs  Escalation of dosage recently?  no    Evidence for abuse or diversion? no   Seen specialist or pain clinic?: yes      Controlled Substances Monitoring:        Diabetes Type 2    Glucose control:   Does patient check blood glucoses at home?  No, thinks that his a1c is elevated because he is staying at home due to the pandemic.  Report of hypoglycemia: no  Lab Results   Component Value Date    LABA1C 9.1 (H) 12/04/2018     No results found for: EAG    Symptoms  Polyuria, Polydipsia or Polyphagia?   No  Chest Pain, SOB, or Palpitations? -  No  New Vision complaints? No  Paresthesias of the extremities?  No    Medications  Current medication were reviewed.  Compliant with medications?  yes  Medication side effects?  No  On ACE-I or ARB?  Yes  On antiplatelet therapy?  Yes  On Statin?  Yes      Exercise  Exercise? Not much recently, but feels like he can get more active.  Wt Readings from Last 3 Encounters:   12/04/18 231 lb (104.8 kg)   11/07/18 231 lb 6.4 oz (105 kg)    09/03/18 227 lb 14.4 oz (103.4 kg)       Diet discipline?:  Low salt, fat, sugar diet?  'I've been eating more'    Blood pressure control:  BP Readings from Last 3 Encounters:   12/04/18 118/80   11/07/18 126/85   09/03/18 126/72       Lab Results   Component Value Date    LABMICR 1.28 09/03/2014       Lab Results   Component Value Date    LDLCALC 88 10/24/2017     HTN    Does patient check BP regularly at home? - No  Current Medication regimen - Lisinopril, Metoprolol, Imdur  Tolerating medications well? - yes    Shortness of breath or chest pain? No  Headache or visual complaints? No  Neurologic  changes like confusion? No  Extremity edema? No    BP Readings from Last 3 Encounters:   12/04/18 118/80   11/07/18 126/85   09/03/18 126/72           Health Maintenance   Topic Date Due   ??? Diabetic retinal exam  06/11/1964   ??? HIV screen  06/11/1969   ??? DTaP/Tdap/Td vaccine (1 - Tdap) 06/11/1973   ??? Diabetic foot exam  07/14/2016   ??? Annual Wellness Visit (AWV)  06/21/2017   ??? Flu vaccine (1) 09/25/2018   ??? Lipid screen  10/25/2018   ??? Potassium monitoring  10/25/2018   ??? Creatinine monitoring  10/25/2018   ??? A1C test (Diabetic or Prediabetic)  12/04/2018   ??? Diabetic microalbuminuria test  02/27/2019   ??? Colon cancer screen colonoscopy  12/20/2022   ??? Shingles Vaccine  Completed   ??? Pneumococcal 0-64 years Vaccine  Completed   ??? Hepatitis C screen  Completed   ??? Hepatitis A vaccine  Aged Out   ??? Hib vaccine  Aged Out   ??? Meningococcal (ACWY) vaccine  Aged Out       Past Medical History:   Diagnosis Date   ??? Arthritis     general   ??? CAD (coronary artery disease)     s/p stent placement-sees Dr. Irene Limbo   ??? Chronic back pain    ??? Diabetes mellitus ([place])    ??? GERD (gastroesophageal reflux disease)    ??? Hyperlipidemia    ??? Hypertension        Past Surgical History:   Procedure Laterality Date   ??? COLONOSCOPY  2015    [place], [place]   ??? COLONOSCOPY Left 12/19/2017    COLONOSCOPY POLYPECTOMY HOT BIOPSY performed  by Earmon Phoenix, MD at [facility]   ??? CORONARY ARTERY BYPASS GRAFT  2009    6 vessel-[place]   ??? DIAGNOSTIC CARDIAC CATH LAB PROCEDURE     ??? ORTHOPEDIC SURGERY Left    ??? PTCA  07/04/2016   ??? TONSILLECTOMY         Social History     Tobacco Use   ??? Smoking status: Former Smoker     Packs/day: 1.00     Years: 15.00     Pack years: 15.00     Types: Cigarettes     Last attempt to quit: 05/24/2008     Years since quitting: 10.5   ??? Smokeless tobacco: Never Used   Substance Use Topics   ??? Alcohol use: Yes     Alcohol/week: 1.0 standard drinks     Types: 1 Glasses of wine per week     Comment: once monthly stopped a over 2 years   ??? Drug use: No       Family History   Problem Relation Age of Onset   ??? Cancer Mother    ??? Heart Disease Brother    ??? Stroke Brother    ??? Diabetes Brother    ??? Early Death Brother    ??? High Blood Pressure Brother    ??? High Cholesterol Brother    ??? Mental Retardation Sister          I have reviewed the patient's past medical history, past surgical history, allergies, medications, social and family history and I have made updates where appropriate.    Review of Systems        PHYSICAL EXAM:  BP 118/80    Pulse 84  Temp 99 ??F (37.2 ??C) (Oral)    Resp 14    Ht 5\' 7"  (1.702 m)    Wt 231 lb (104.8 kg)    SpO2 100%    BMI 36.18 kg/m??     General Appearance: well developed and well- nourished, in no acute distress  Head: normocephalic and atraumatic  ENT: external ear and ear canal normal bilaterally, nose without deformity, nasal mucosa and turbinates normal without polyps, oropharynx normal, dentition is normal for age, no lipor gum lesions noted  Neck: supple and non-tender without mass, no thyromegaly or thyroid nodules, no cervical lymphadenopathy  Pulmonary/Chest: clear to auscultation bilaterally- no wheezes, rales or rhonchi, normal air movement, no respiratory distress or retractions  Cardiovascular: normal rate, regular rhythm, normal S1 and S2, no murmurs, rubs, clicks, or  gallops, distal pulses intact  Extremities: no cyanosis, clubbing or edema of the lower extremities  Psych:  Normal affect without evidence of depression oranxiety, insight and judgement are appropriate, memory appears intact  Skin: warm and dry, no rash or erythema      ASSESSMENT & PLAN  Cortlin was seen today for medicare awv, discuss medications, flu vaccine and health maintenance.    Diagnoses and all orders for this visit:    Routine general medical examination at a health care facility    Needs flu shot  -     INFLUENZA, QUADV, 3 YRS AND OLDER, IM PF, PREFILL SYR OR SDV, 0.5ML (AFLURIA QUADV, PF)    Type 2 diabetes mellitus without complication, without long-term current use of insulin (HCC)  -     POCT glycosylated hemoglobin (Hb A1C)  -     Comprehensive Metabolic Panel; Future  -     Lipid Panel; Future  -     HM DIABETES FOOT EXAM    Essential hypertension  -     Comprehensive Metabolic Panel; Future  -     Lipid Panel; Future    Screening for human immunodeficiency virus  -     HIV-1 and HIV-2 Antibodies; Future    Exposure to COVID-19 virus  -     Covid-19, Antibody, Total; Future    -DM2 uncontrolled, his a1c is typically uncontrolled when he is non compliant from a diet standpoint.  He admits that he has not been eating well or very active.  Will do 3 months of lifestyle improvement and see if he gets to goal, if not, will start new medication  -Back pain is worsening, he is declining pain management referral for now, but would consider it if pain worsens.  Will let me know if he wants to do this, continue percocet at current dosing.  -Other Chronic issues stable, continue current medications  -Advised to call if any issues        Return in 3 months (on 03/06/2019) for Medicare Annual Wellness Visit in 1 year.    Controlled Substances Monitoring:                     Welles received counseling on the following healthy behaviors: medication adherence  Reviewed prior labs and health maintenance.  Continue  current medications, diet and exercise.  Discussed use, benefit, and side effects of prescribed medications. Barriers to medication compliance addressed.   Patient given educational materials - see patient instructions.    All patient questions answered.  Patient voiced understanding.

## 2018-12-04 NOTE — Patient Instructions (Signed)
Personalized Preventive Plan for Micheal [name] - 12/04/2018  Medicare offers a range of preventive health benefits. Some of the tests and screenings are paid in full while other may be subject to a deductible, co-insurance, and/or copay.    Some of these benefits include a comprehensive review of your medical history including lifestyle, illnesses that may run in your family, and various assessments and screenings as appropriate.    After reviewing your medical record and screening and assessments performed today your provider may have ordered immunizations, labs, imaging, and/or referrals for you.  A list of these orders (if applicable) as well as your Preventive Care list are included within your After Visit Summary for your review.    Other Preventive Recommendations:    ?? A preventive eye exam performed by an eye specialist is recommended every 1-2 years to screen for glaucoma; cataracts, macular degeneration, and other eye disorders.  ?? A preventive dental visit is recommended every 6 months.  ?? Try to get at least 150 minutes of exercise per week or 10,000 steps per day on a pedometer .  ?? Order or download the FREE "Exercise & Physical Activity: Your Everyday Guide" from [employer]. Call (669)208-9962 or search [employer] online.  ?? You need 1200-1500 mg of calcium and 1000-2000 IU of vitamin D per day. It is possible to meet your calcium requirement with diet alone, but a vitamin D supplement is usually necessary to meet this goal.  ?? When exposed to the sun, use a sunscreen that protects against both UVA and UVB radiation with an SPF of 30 or greater. Reapply every 2 to 3 hours or after sweating, drying off with a towel, or swimming.  ?? Always wear a seat belt when traveling in a car. Always wear a helmet when riding a bicycle or motorcycle.

## 2018-12-04 NOTE — Progress Notes (Signed)
Medicare Annual Wellness Visit  Name: Micheal [name] Today???s Date: 12/04/2018   MRN: [id number] Sex: Male   Age: 63 y.o. Ethnicity: Hispanic/Latino   DOB: 05/01/1955 Race: Hispanic      Micheal [name] is here for Medicare AWV; Discuss Medications (pt requesting to discuss the 2 nasal sprays, states he is only taking 1 and wants to know if he is to take both ); Flu Vaccine; and Health Maintenance (due for DM eye and foot exam )    Screenings for behavioral, psychosocial and functional/safety risks, and cognitive dysfunction are all negative except as indicated below. These results, as well as other patient data from the [place] [facility] form, are documented in Flowsheets linked to this Encounter.    Allergies   Allergen Reactions   ??? Gabapentin          Prior to Visit Medications    Medication Sig Taking? Authorizing Provider   diazePAM (VALIUM) 5 MG tablet Take 1 tablet by mouth every 8 hours as needed for Anxiety for up to 30 days. Yes Kyra Leyland, DO   oxyCODONE-acetaminophen (PERCOCET) 10-325 MG per tablet Take 1 tablet by mouth every 8 hours as needed for Pain for up to 30 days. Yes Kyra Leyland, DO   nitroGLYCERIN (NITROSTAT) 0.4 MG SL tablet Place 1 tablet under the tongue every 5 minutes as needed for Chest pain Yes Zoheir A Abdelbaki, MD   metoprolol succinate (TOPROL XL) 25 MG extended release tablet TAKE 1 TABLET BY MOUTH ONCE DAILY Yes Zoheir A Abdelbaki, MD   ticagrelor (BRILINTA) 90 MG TABS tablet TAKE 1 TABLET BY MOUTH TWICE DAILY Yes Zoheir A Abdelbaki, MD   atorvastatin (LIPITOR) 80 MG tablet Take 1 tablet by mouth nightly Yes Zoheir A Abdelbaki, MD   PARoxetine (PAXIL) 40 MG tablet Take 1 tablet by mouth once daily Yes Dicie Edelen D Tassie Pollett, DO   fenofibrate (TRICOR) 145 MG tablet TAKE 1 TABLET BY MOUTH DAILY Yes Antony Contras, APRN - CNP   esomeprazole (NEXIUM) 40 MG delayed release capsule TAKE 1 CAPSULE BY MOUTH ONCE DAILY IN THE MORNING BEFORE BREAKFAST Yes Antony Contras, APRN - CNP    fluticasone (FLONASE) 50 MCG/ACT nasal spray Use 2 spray(s) in each nostril once daily Yes Madylyn Insco D Henley Boettner, DO   pregabalin (LYRICA) 50 MG capsule Take 1 capsule by mouth 3 times daily for 90 days. Yes Nazario Russom D Thersia Petraglia, DO   glimepiride (AMARYL) 4 MG tablet TAKE 1 TABLET BID Yes Grizel Vesely D Mamie Hundertmark, DO   montelukast (SINGULAIR) 10 MG tablet Take 1 tablet by mouth once daily Yes Celine Ahr Isaac Lacson, DO   lisinopril (PRINIVIL;ZESTRIL) 40 MG tablet Take 1 tablet by mouth once daily Yes Kyra Leyland, DO   metFORMIN (GLUCOPHAGE) 500 MG tablet Take 1 tablet by mouth 2 times daily (with meals) Yes Nicanor Alcon, DO   isosorbide mononitrate (IMDUR) 30 MG extended release tablet Take 1 tablet by mouth daily Yes Zoheir A Abdelbaki, MD   olopatadine (PATANOL) 0.1 % ophthalmic solution Place 1 drop into both eyes 2 times daily Yes Kyra Leyland, DO   hydrocortisone 2.5 % cream Apply to external ear BID PRN Yes Kyra Leyland, DO   tamsulosin (FLOMAX) 0.4 MG capsule Take 1 capsule by mouth nightly Yes Antony Contras, APRN - CNP   Handicap Placard MISC by Does not apply route Expires 03/01/2020 Yes Kyra Leyland, DO   Multiple Vitamins-Minerals (CENTRUM SILVER ULTRA MENS) TABS  Take 1 tablet by mouth daily Yes Historical Provider, MD         Past Medical History:   Diagnosis Date   ??? Arthritis     general   ??? CAD (coronary artery disease)     s/p stent placement-sees Dr. Gabrielle Dare   ??? Chronic back pain    ??? Diabetes mellitus (HCC)    ??? GERD (gastroesophageal reflux disease)    ??? Hyperlipidemia    ??? Hypertension        Past Surgical History:   Procedure Laterality Date   ??? [place]   ??? COLONOSCOPY Left 12/19/2017    COLONOSCOPY POLYPECTOMY HOT BIOPSY performed by Florian Buff, MD at [facility]oscopy   ??? CORONARY ARTERY BYPASS GRAFT  2009    6 vessel-[place]   ??? DIAGNOSTIC CARDIAC CATH LAB PROCEDURE     ??? ORTHOPEDIC SURGERY Left    ??? PTCA  07/04/2016   ??? TONSILLECTOMY           Family History   Problem  Relation Age of Onset   ??? Cancer Mother    ??? Heart Disease Brother    ??? Stroke Brother    ??? Diabetes Brother    ??? Early Death Brother    ??? High Blood Pressure Brother    ??? High Cholesterol Brother    ??? Mental Retardation Sister        CareTeam (Including outside providers/suppliers regularly involved in providing care):   Patient Care Team:  [name], DO as PCP - General (Family Medicine)  [name], DO as PCP - [facility] Empaneled Provider    Wt Readings from Last 3 Encounters:   12/04/18 231 lb (104.8 kg)   11/07/18 231 lb 6.4 oz (105 kg)   09/03/18 227 lb 14.4 oz (103.4 kg)     Vitals:    12/04/18 1243   BP: 118/80   Pulse: 84   Resp: 14   Temp: 99 ??F (37.2 ??C)   TempSrc: Oral   SpO2: 100%   Weight: 231 lb (104.8 kg)   Height:  (1.702 m)     Body mass index is 36.18 kg/m??.    Based upon direct observation of the patient, evaluation of cognition reveals recent and remote memory intact.    Patient's complete Health Risk Assessment and screening values have been reviewed and are found in Flowsheets. The following problems were reviewed today and where indicated follow up appointments were made and/or referrals ordered.    Positive Risk Factor Screenings with Interventions:       General Health and ACP:  General  In general, how would you say your health is?: Good  In the past 7 days, have you experienced any of the following? New or Increased Pain, New or Increased Fatigue, Loneliness, Social Isolation, Stress or Anger?: (!) New or Increased Pain  Do you get the social and emotional support that you need?: Yes  Do you have a Living Will?: (!) No  Advance Directives     Power of Attorney Living Will ACP-Advance Directive ACP-Power of Attorney    Not on File Not on File [place]      General Health Risk Interventions:  ?? Pain issues: see progress note  ?? No Living Will: Patient declines ACP discussion/assistance    Health Habits/Nutrition:  Health Habits/Nutrition  Do you exercise for at least 20 minutes 2-3  times per week?: [name])  No  Have you lost any weight without trying in the past 3 months?: No  Do you eat fewer than 2 meals per day?: No  Have you seen a dentist within the past year?: (!) No  Body mass index: (!) 36.18  Health Habits/Nutrition Interventions:  ?? Dental exam overdue:  patient encouraged to make appointment with his/her dentist  ?? Advised on diet and exercise    Hearing/Vision:  No exam data present  Hearing/Vision  Do you or your family notice any trouble with your hearing?: (!) Yes  Do you have difficulty driving, watching TV, or doing any of your daily activities because of your eyesight?: No  Have you had an eye exam within the past year?: (!) No  Hearing/Vision Interventions:  ?? Hearing concerns:  patient declines any further evaluation/treatment for hearing issues  ?? Vision concerns:  patient encouraged to make appointment with his/her eye specialist    Safety:  Safety  Do you have working smoke detectors?: Yes  Have all throw rugs been removed or fastened?: Yes  Do you have non-slip mats or surfaces in all bathtubs/showers?: (!) No  Do all of your stairways have a railing or banister?: (no stairs)  Are your doorways, halls and stairs free of clutter?: Yes  Do you always fasten your seatbelt when you are in a car?: Yes  Safety Interventions:  ?? Home safety tips provided    Personalized Preventive Plan   Current Health Maintenance Status  Immunization History   Administered Date(s) Administered   ??? Influenza Vaccine, unspecified formulation 09/25/2014   ??? Influenza, Quadv, IM, PF (6 mo and older Fluzone, Flulaval, Fluarix, and 3 yrs and older Afluria) 01/13/2016, 11/10/2016, 10/24/2017   ??? Pneumococcal Conjugate 13-valent (Prevnar13) 11/15/2017   ??? Pneumococcal Polysaccharide (Pneumovax23) 07/24/2013   ??? Zoster Recombinant (Shingrix) 11/15/2017, 09/03/2018        Health Maintenance   Topic Date Due   ??? Diabetic retinal exam  06/11/1964   ??? HIV screen  06/11/1969   ??? DTaP/Tdap/Td vaccine (1 - Tdap)  06/11/1973   ??? Diabetic foot exam  07/14/2016   ??? Annual Wellness Visit (AWV)  06/21/2017   ??? Flu vaccine (1) 09/25/2018   ??? Lipid screen  10/25/2018   ??? Potassium monitoring  10/25/2018   ??? Creatinine monitoring  10/25/2018   ??? A1C test (Diabetic or Prediabetic)  12/04/2018   ??? Diabetic microalbuminuria test  02/27/2019   ??? Colon cancer screen colonoscopy  12/20/2022   ??? Shingles Vaccine  Completed   ??? Pneumococcal 0-64 years Vaccine  Completed   ??? Hepatitis C screen  Completed   ??? Hepatitis A vaccine  Aged Out   ??? Hib vaccine  Aged Out   ??? Meningococcal (ACWY) vaccine  Aged Out     Recommendations for Preventive Services Due: see orders and patient instructions/AVS.  .  Recommended screening schedule for the next 5-10 years is provided to the patient in written form: see Patient Instructions/AVS.    Micheal [name] was seen today for medicare awv, discuss medications, flu vaccine and health maintenance.    Diagnoses and all orders for this visit:    Routine general medical examination at a health care facility    Needs flu shot  -     INFLUENZA, QUADV, 3 YRS AND OLDER, IM PF, PREFILL SYR OR SDV, 0.5ML (AFLURIA QUADV, PF)    Type 2 diabetes mellitus without complication, without long-term current use of insulin (HCC)  -  POCT glycosylated hemoglobin (Hb A1C)  -     Comprehensive Metabolic Panel; Future  -     Lipid Panel; Future  -     HM DIABETES FOOT EXAM    Essential hypertension  -     Comprehensive Metabolic Panel; Future  -     Lipid Panel; Future    Screening for human immunodeficiency virus  -     HIV-1 and HIV-2 Antibodies; Future    Exposure to COVID-19 virus  -     Covid-19, Antibody, Total; Future

## 2018-12-04 NOTE — Progress Notes (Signed)
Immunization(s) given during visit:    Immunizations Administered     Name Date Dose Route    Influenza, Quadv, IM, PF (6 mo and older Fluzone, Flulaval, Fluarix, and 3 yrs and older Afluria) 12/04/2018 0.5 mL Intramuscular    Site: Deltoid- Right    Lot: [id number]    NDC: 33332-320-02          Most recent Vaccine Information Sheet dated 09/07/17 given to ptVaccine Information Sheet, "Influenza - Inactivated"  given to Micheal [name], or parent/legal guardian of  Micheal [name] and verbalized understanding.    Patient responses:    Have you ever had a reaction to a flu vaccine? No  Do you have an allergy to eggs, neomycin or polymixin?  No  Do you have an allergy to Thimerosal, contact lens solution, or Merthiolate? No  Have you ever had Guillian Barre Syndrome?  No  Do you have any current illness?  No  Do you have a temperature above 100 degrees? No  Are you pregnant? No  If pregnant, permission obtained from physician? No  Do you have an active neurological disorder? No      Flu vaccine given per order. Please see immunization tab.

## 2018-12-17 ENCOUNTER — Inpatient Hospital Stay: Payer: MEDICARE | Primary: Family Medicine

## 2018-12-17 DIAGNOSIS — Z114 Encounter for screening for human immunodeficiency virus [HIV]: Secondary | ICD-10-CM

## 2018-12-17 LAB — LIPID PANEL
Cholesterol, Total: 167 mg/dL (ref 100–199)
HDL: 23 mg/dL
LDL Calculated: 92 mg/dL
Triglycerides: 258 mg/dL — ABNORMAL HIGH (ref 0–199)

## 2018-12-17 LAB — COVID-19, ANTIBODY, TOTAL: SARS-CoV-2 Antibody, Total: NEGATIVE

## 2018-12-17 LAB — COMPREHENSIVE METABOLIC PANEL
ALT: 62 U/L (ref 11–66)
AST: 33 U/L (ref 5–40)
Albumin: 4.6 g/dL (ref 3.5–5.1)
Alkaline Phosphatase: 43 U/L (ref 38–126)
BUN: 20 mg/dL (ref 7–22)
CO2: 26 meq/L (ref 23–33)
Calcium: 10.2 mg/dL (ref 8.5–10.5)
Chloride: 99 meq/L (ref 98–111)
Creatinine: 1.2 mg/dL (ref 0.4–1.2)
Glucose: 214 mg/dL — ABNORMAL HIGH (ref 70–108)
Potassium: 4.6 meq/L (ref 3.5–5.2)
Sodium: 138 meq/L (ref 135–145)
Total Bilirubin: 0.3 mg/dL (ref 0.3–1.2)
Total Protein: 7.7 g/dL (ref 6.1–8.0)

## 2018-12-17 LAB — GLOMERULAR FILTRATION RATE, ESTIMATED: Est, Glom Filt Rate: 61 mL/min/{1.73_m2} — AB

## 2018-12-17 LAB — ANION GAP: Anion Gap: 13 meq/L (ref 8.0–16.0)

## 2018-12-19 ENCOUNTER — Encounter

## 2018-12-19 LAB — HIV SCREEN: HIV-2 Ab: NEGATIVE

## 2018-12-19 NOTE — Telephone Encounter (Signed)
[name] called requesting a refill on the following medications:  Requested Prescriptions     Pending Prescriptions Disp Refills   ??? tamsulosin (FLOMAX) 0.4 MG capsule [Pharmacy Med Name: Tamsulosin HCl 0.4 MG Oral Capsule] 90 capsule 0     Sig: TAKE 1 CAPSULE BY MOUTH NIGHTLY     Pharmacy verified:  .pv      Date of last visit: 01/08/2018  Date of next visit (if applicable): not scheduled

## 2018-12-22 MED ORDER — TAMSULOSIN HCL 0.4 MG PO CAPS
0.4 MG | ORAL_CAPSULE | Freq: Every evening | ORAL | 0 refills | Status: DC
Start: 2018-12-22 — End: 2019-03-06

## 2018-12-22 NOTE — Telephone Encounter (Signed)
sent 

## 2018-12-25 ENCOUNTER — Encounter

## 2018-12-25 NOTE — Telephone Encounter (Signed)
[name] called requesting a refill on the following medications:  Requested Prescriptions     Pending Prescriptions Disp Refills   ??? oxyCODONE-acetaminophen (PERCOCET) 10-325 MG per tablet 90 tablet 0     Sig: Take 1 tablet by mouth every 8 hours as needed for Pain for up to 30 days.   ??? diazePAM (VALIUM) 5 MG tablet 90 tablet 0     Sig: Take 1 tablet by mouth every 8 hours as needed for Anxiety for up to 30 days.     Pharmacy verified:[email or web address]  [facility]      Date of last visit: 12/05/18  Date of next visit (if applicable): Visit date not found

## 2018-12-26 MED ORDER — DIAZEPAM 5 MG PO TABS
5 MG | ORAL_TABLET | Freq: Three times a day (TID) | ORAL | 0 refills | Status: DC | PRN
Start: 2018-12-26 — End: 2019-01-24

## 2018-12-26 MED ORDER — OXYCODONE-ACETAMINOPHEN 10-325 MG PO TABS
10-325 MG | ORAL_TABLET | Freq: Three times a day (TID) | ORAL | 0 refills | Status: DC | PRN
Start: 2018-12-26 — End: 2019-01-24

## 2019-01-13 ENCOUNTER — Encounter

## 2019-01-14 ENCOUNTER — Encounter

## 2019-01-14 MED ORDER — PREGABALIN 50 MG PO CAPS
50 MG | ORAL_CAPSULE | Freq: Three times a day (TID) | ORAL | 0 refills | Status: DC
Start: 2019-01-14 — End: 2019-02-14

## 2019-01-14 MED ORDER — ESOMEPRAZOLE MAGNESIUM 40 MG PO CPDR
40 MG | ORAL_CAPSULE | Freq: Every day | ORAL | 3 refills | Status: DC
Start: 2019-01-14 — End: 2019-03-21

## 2019-01-24 ENCOUNTER — Encounter

## 2019-01-24 MED ORDER — FLUTICASONE PROPIONATE 50 MCG/ACT NA SUSP
50 MCG/ACT | NASAL | 3 refills | Status: DC
Start: 2019-01-24 — End: 2019-04-02

## 2019-01-24 MED ORDER — DIAZEPAM 5 MG PO TABS
5 MG | ORAL_TABLET | Freq: Three times a day (TID) | ORAL | 0 refills | Status: DC | PRN
Start: 2019-01-24 — End: 2019-02-21

## 2019-01-24 MED ORDER — OXYCODONE-ACETAMINOPHEN 10-325 MG PO TABS
10-325 MG | ORAL_TABLET | Freq: Three times a day (TID) | ORAL | 0 refills | Status: DC | PRN
Start: 2019-01-24 — End: 2019-02-21

## 2019-01-24 NOTE — Telephone Encounter (Signed)
Recent Visits  Date Type Provider Dept   12/04/18 Office Visit Cecille RubinMark D Kahle, DO [facility]   09/03/18 Office Visit Cecille RubinMark D Kahle, DO [facility]   02/26/18 Office Visit Cecille RubinMark D Kahle, DO [facility]   02/02/18 Office Visit Cecille RubinMark D Kahle, DO [facility]   11/15/17 Office Visit Cecille RubinMark D Kahle, DO [facility]   10/24/17 Office Visit Cecille RubinMark D Kahle, DO [facility]   Showing recent visits within past 540 days with a meds authorizing provider and meeting all other requirements     Future Appointments  Date Type Provider Dept   03/06/19 Appointment Cecille RubinMark D Kahle, DO [facility]   Showing future appointments within next 150 days with a meds authorizing provider and meeting all other requirements       Future Appointments   Date Time Provider Department Center   03/06/2019  1:20 PM Cecille RubinMark D Kahle, DO [facility] [place]   11/06/2019 10:45 AM Zoheir Derl BarrowA Abdelbaki, MD [facility] [place]   12/10/2019 12:40 PM Cecille RubinMark D Kahle, DO [facility] [place]

## 2019-02-14 ENCOUNTER — Encounter

## 2019-02-14 MED ORDER — PREGABALIN 50 MG PO CAPS
50 MG | ORAL_CAPSULE | ORAL | 5 refills | Status: DC
Start: 2019-02-14 — End: 2019-02-18

## 2019-02-18 ENCOUNTER — Encounter

## 2019-02-18 MED ORDER — PREGABALIN 50 MG PO CAPS
50 MG | ORAL_CAPSULE | ORAL | 0 refills | Status: DC
Start: 2019-02-18 — End: 2019-05-06

## 2019-02-18 NOTE — Telephone Encounter (Signed)
Pt is currently in [place] and is needing a 7 day supply sent to [facility] in [place].  Pt will be arriving home this weekend to pick up the 90 day supply at [facility] on [address]    Medication pending, please review

## 2019-02-21 ENCOUNTER — Encounter

## 2019-02-21 MED ORDER — OXYCODONE-ACETAMINOPHEN 10-325 MG PO TABS
10-325 MG | ORAL_TABLET | Freq: Three times a day (TID) | ORAL | 0 refills | Status: DC | PRN
Start: 2019-02-21 — End: 2019-03-21

## 2019-02-21 MED ORDER — DIAZEPAM 5 MG PO TABS
5 MG | ORAL_TABLET | Freq: Three times a day (TID) | ORAL | 0 refills | Status: DC | PRN
Start: 2019-02-21 — End: 2019-03-21

## 2019-03-05 ENCOUNTER — Ambulatory Visit: Admit: 2019-03-05 | Discharge: 2019-03-05 | Payer: MEDICARE | Attending: Family | Primary: Family Medicine

## 2019-03-05 DIAGNOSIS — R062 Wheezing: Secondary | ICD-10-CM

## 2019-03-05 LAB — POCT GLYCOSYLATED HEMOGLOBIN (HGB A1C): Hemoglobin A1C: 9.1 % — ABNORMAL HIGH (ref 4.3–5.7)

## 2019-03-05 LAB — POCT MICROALBUMIN
MICROALBUMIN POC: 80 mg/L
POC Creatinine: 200 mg/dL

## 2019-03-05 MED ORDER — ALBUTEROL SULFATE HFA 108 (90 BASE) MCG/ACT IN AERS
108 (90 Base) MCG/ACT | Freq: Four times a day (QID) | RESPIRATORY_TRACT | 2 refills | Status: DC | PRN
Start: 2019-03-05 — End: 2019-07-02

## 2019-03-05 MED ORDER — METFORMIN HCL 500 MG PO TABS
500 MG | ORAL_TABLET | Freq: Two times a day (BID) | ORAL | 3 refills | Status: DC
Start: 2019-03-05 — End: 2019-12-10

## 2019-03-05 MED ORDER — BLOOD GLUCOSE MONITORING SUPPL DEVI
PACK | 0 refills | Status: AC
Start: 2019-03-05 — End: ?

## 2019-03-05 NOTE — Progress Notes (Signed)
[facility]  [address] [place]  [place] [address]  Dept: [phone]  Loc: [phone]  Visit Date: 03/05/2019      Chief Complaint:   Micheal [name] is a 64 y.o. male thatpresents for Diabetes (54monthA1C Micro )      HPI:  Diabetes Type 2    Glucose control:   Does patient check blood glucoses at home?  No  Report of hypoglycemia: no  Lab Results   Component Value Date    LABA1C 9.1 (H) 03/05/2019     No results found for: EAG    Symptoms  Polyuria, Polydipsia or Polyphagia?   Notices polydipsia at times  Chest Pain, SOB, or Palpitations? -  No  New Vision complaints? No  Paresthesias of the extremities?  Yes - some N/T hands and feet    Medications  Current medication were reviewed.  Compliant with medications?  yes  Medication side effects?  No  On ACE-I or ARB?  Yes  On antiplatelet therapy?  Yes  On Statin?  Yes    Last Diabetic Eye Exam: will schedule this    Exercise  Exercise? No  Wt Readings from Last 3 Encounters:   03/05/19 228 lb 9.6 oz (103.7 kg)   12/04/18 231 lb (104.8 kg)   11/07/18 231 lb 6.4 oz (105 kg)       Diet discipline?:  Low salt, fat, sugar diet?  no    Blood pressure control:  BP Readings from Last 3 Encounters:   03/05/19 128/78   12/04/18 118/80   11/07/18 126/85       Lab Results   Component Value Date    LABMICR 1.28 09/03/2014       Lab Results   Component Value Date    LDLCALC 92 12/17/2018     Has intermittently noticed wheezing the past few months  Had a hx of asthma as a child  Previously took Albuterol and it helped  Feels he gets winded more easily now but says he is not SOB  Denies any associated chest pain, dizziness, lightheadedness    He comes in alone.  History obtained from pt and medical records.  I have reviewed the patient's past medical history, past surgical history, allergies,medications, social and family history and I have made updates where appropriate.    Past Medical History:   Diagnosis Date   ??? Arthritis      general   ??? CAD (coronary artery disease)     s/p stent placement-sees Dr. BIrene Limbo  ??? Chronic back pain    ??? Diabetes mellitus ([place]    ??? GERD (gastroesophageal reflux disease)    ??? Hyperlipidemia    ??? Hypertension        Past Surgical History:   Procedure Laterality Date   ??? [facility]  ??? COLONOSCOPY Left 12/19/2017    COLONOS[facility]   ??? CORONARY ARTERY BYPASS GRAFT  2009    [place]  ??? DIAGNOSTIC CARDIAC CATH LAB PROCEDURE     ??? ORTHOPEDIC SURGERY Left    ??? PTCA  07/04/2016   ??? TONSILLECTOMY         Social History     Tobacco Use   ??? Smoking status: Former Smoker     Packs/day: 1.00     Years: 15.00  Pack years: 15.00     Types: Cigarettes     Quit date: 05/24/2008     Years since quitting: 10.8   ??? Smokeless tobacco: Never Used   Substance Use Topics   ??? Alcohol use: Yes     Alcohol/week: 1.0 standard drinks     Types: 1 Glasses of wine per week     Comment: once monthly stopped a over 2 years   ??? Drug use: No       Family History   Problem Relation Age of Onset   ??? Cancer Mother    ??? Heart Disease Brother    ??? Stroke Brother    ??? Diabetes Brother    ??? Early Death Brother    ??? High Blood Pressure Brother    ??? High Cholesterol Brother    ??? Mental Retardation Sister          Review of Systems  Constitutional: Negative for Fever, Chills, Fatigue  Cardiovascular:  Negative for chest Pain, Palpitations  Respiratory:  Negative for Cough, Shortness of breath +wheezing  Gastrointestinal:  Nausea, Vomiting, Diarrhea, Constipation, Blood in stool  Genitourinary:  Negative for Difficulty or painful urination, Urgency   Skin:  Negative for Color change, Rash, Itching, Wound  Psychiatric:  Negative for Anxiety, Depression, Suicidal ideation  Musculoskeletal:  Negative for Joint pain, Back pain, Gait problems  Neurological:  Negative for Dizziness, Headaches        PHYSICAL EXAM:  BP 128/78    Pulse  78    Temp 98.6 ??F (37 ??C)    Resp 18    Ht _0  (1.702 m)    Wt 228 lb 9.6 oz (103.7 kg)    SpO2 98%    BMI 35.80 kg/m??     General Appearance: well developed and well- nourished, in no acute distress  Head: normocephalic and atraumatic  Eyes: pupils equal, round, and reactive to light,  conjunctivae and eye lids without erythema  ENT: external ear normal bilaterally, nose without deformity, nasal mucosa and turbinates normal without polyps, oropharynx normal, dentition is normal for age, no lip or gum lesions noted  Neck: supple and non-tender without mass, no thyromegaly or thyroid nodules, no cervical lymphadenopathy  Pulmonary/Chest: clear to auscultation bilaterally- no wheezes, rales or rhonchi, normal air movement, no respiratory distress or retractions  Cardiovascular: normal rate, regular rhythm, normal S1 and S2, no murmurs, rubs, clicks, or gallops,distal pulses intact  Abdomen: soft, non-tender, non-distended, normal bowel sounds,  no rebound or guarding, nomasses or hernias noted  Extremities: no cyanosis, clubbing or edema of the lower extremities  Musculoskeletal: No joint swelling or gross deformity   Neuro:  Alert, 5/5 strength globally and symmetricallyl,  normal speech, no focal findings or movement disorder noted, gait wnl  Psych:  Normal affect without evidence of depression or anxiety, insight and judgement are appropriate, memoryappears intact  Skin: warm and dry, no rash or erythema  Lymph:  No cervical, auricular or supraclavicular lymph nodes palpated    ASSESSMENT & PLAN  Zimere was seen today for diabetes.    Diagnoses and all orders for this visit:    Wheezing  -     albuterol sulfate HFA 108 (90 Base) MCG/ACT inhaler; Inhale 2 puffs into the lungs every 6 hours as needed for Wheezing or Shortness of Breath  -     94060 - PR Eval of Bronchospasm after Bronchodialator; Future    Type 2 diabetes mellitus without complication, without long-term current  use of insulin (HCC)  -     blood  glucose monitor kit and supplies; Dispense sufficient amount for indicated testing frequency plus additional to accommodate PRN testing needs. Dispense all needed supplies to include: monitor, strips, lancing device, lancets, control solutions, alcohol swabs.  -     metFORMIN (GLUCOPHAGE) 500 MG tablet; Take 1 tablet by mouth 2 times daily (with meals)    Exertional shortness of breath  -     94060 - PR Eval of Bronchospasm after Bronchodialator; Future    Other orders  -     POCT microalbumin  -     POCT glycosylated hemoglobin (Hb A1C)        follow up with me after PFT  He is going to try and be more strict with his diet  Needs A1C recheck and weight recheck in follow up      Return in about 4 weeks (around 04/02/2019).  a  Shanon Brow received counseling on the following healthy behaviors: nutrition, exercise and medication adherence  Reviewedprior labs and health maintenance.  Continue current medications, diet and exercise.  Discussed use, benefit, and side effects of prescribed medications. Barriers to medication compliance addressed.   Patient given educationalmaterials - see patient instructions.    All patient questions answered.  Patient voiced understanding.     Electronically signed by Antony Contras, APRN on 03/05/19 at 2:02 PM EST

## 2019-03-05 NOTE — Patient Instructions (Addendum)
RTO in 4 weeks      Patient Education        Learning About Diabetes Food Guidelines  Your Care Instructions     Meal planning is important to manage diabetes. It helps keep your blood sugar at a target level (which you set with your doctor). You don't have to eat special foods. You can eat what your family eats, including sweets once in a while. But you do have to pay attention to how often you eat and how much you eat of certain foods.  You may want to work with a dietitian or a certified diabetes educator (CDE) to help you plan meals and snacks. A dietitian or CDE can also help you lose weight if that is one of your goals.  What should you know about eating carbs?  Managing the amount of carbohydrate (carbs) you eat is an important part of healthy meals when you have diabetes. Carbohydrate is found in many foods.  ?? Learn which foods have carbs. And learn the amounts of carbs in different foods.  ? Bread, cereal, pasta, and rice have about 15 grams of carbs in a serving. A serving is 1 slice of bread (1 ounce), ?? cup of cooked cereal, or 1/3 cup of cooked pasta or rice.  ? Fruits have 15 grams of carbs in a serving. A serving is 1 small fresh fruit, such as an apple or orange; ?? of a banana; ?? cup of cooked or canned fruit; ?? cup of fruit juice; 1 cup of melon or raspberries; or 2 tablespoons of dried fruit.  ? Milk and no-sugar-added yogurt have 15 grams of carbs in a serving. A serving is 1 cup of milk or 2/3 cup of no-sugar-added yogurt.  ? Starchy vegetables have 15 grams of carbs in a serving. A serving is ?? cup of mashed potatoes or sweet potato; 1 cup winter squash; ?? of a small baked potato; ?? cup of cooked beans; or ?? cup cooked corn or green peas.  ?? Learn how much carbs to eat each day and at each meal. A dietitian or CDE can teach you how to keep track of the amount of carbs you eat. This is called carbohydrate counting.  ?? If you are not sure how to count carbohydrate grams, use the Plate Method to  plan meals. It is a good, quick way to make sure that you have a balanced meal. It also helps you spread carbs throughout the day.  ? Divide your plate by types of foods. Put non-starchy vegetables on half the plate, meat or other protein food on one-quarter of the plate, and a grain or starchy vegetable in the final quarter of the plate. To this you can add a small piece of fruit and 1 cup of milk or yogurt, depending on how many carbs you are supposed to eat at a meal.  ?? Try to eat about the same amount of carbs at each meal. Do not "save up" your daily allowance of carbs to eat at one meal.  ?? Proteins have very little or no carbs per serving. Examples of proteins are beef, chicken, [place], fish, eggs, tofu, cheese, cottage cheese, and peanut butter. A serving size of meat is 3 ounces, which is about the size of a deck of cards. Examples of meat substitute serving sizes (equal to 1 ounce of meat) are 1/4 cup of cottage cheese, 1 egg, 1 tablespoon of peanut butter, and ?? cup of tofu.  How can you eat out and still eat healthy?  ?? Learn to estimate the serving sizes of foods that have carbohydrate. If you measure food at home, it will be easier to estimate the amount in a serving of restaurant food.  ?? If the meal you order has too much carbohydrate (such as potatoes, corn, or baked beans), ask to have a low-carbohydrate food instead. Ask for a salad or green vegetables.  ?? If you use insulin, check your blood sugar before and after eating out to help you plan how much to eat in the future.  ?? If you eat more carbohydrate at a meal than you had planned, take a walk or do other exercise. This will help lower your blood sugar.  What else should you know?  ?? Limit saturated fat, such as the fat from meat and dairy products. This is a healthy choice because people who have diabetes are at higher risk of heart disease. So choose lean cuts of meat and nonfat or low-fat dairy products. Use olive or canola oil instead of  butter or shortening when cooking.  ?? Don't skip meals. Your blood sugar may drop too low if you skip meals and take insulin or certain medicines for diabetes.  ?? Check with your doctor before you drink alcohol. Alcohol can cause your blood sugar to drop too low. Alcohol can also cause a bad reaction if you take certain diabetes medicines.  Follow-up care is a key part of your treatment and safety. Be sure to make and go to all appointments, and call your doctor if you are having problems. It's also a good idea to know your test results and keep a list of the medicines you take.  Where can you learn more?  Go to [email or web address] and sign in to your MyChart account. Enter [phone] in the [place] box to learn more about "Learning About Diabetes Food Guidelines."     If you do not have an account, please click on the "Sign Up Now" link.  Current as of: January 12, 2018??????????????????????????????Content Version: 12.6  ?? 2006-2020 [employer].   Care instructions adapted under license by [facility]. If you have questions about a medical condition or this instruction, always ask your healthcare professional. Lindsay any warranty or liability for your use of this information.         Patient Education        Learning About Low-Carbohydrate Diets  What is a low-carbohydrate diet?     A low-carbohydrate (or "low-carb") diet limits foods and drinks that have carbohydrates. This includes grains, fruits, milk and yogurt, and starchy vegetables like potatoes, beans, and corn. It also avoids foods and drinks that have added sugar. Instead, low-carb diets include foods that are high in protein and fat.  Why might you follow a low-carb diet?  Low-carb diets may be used for a variety of reasons, such as for weight loss. People who have diabetes may use a low-carb diet to help manage their blood sugar levels.  What should you do before you start the diet?  Talk to  your doctor before you try any diet. This is even more important if you have health problems like kidney disease, heart disease, or diabetes. Your doctor may suggest that you meet with a registered dietitian. A dietitian can help you make an eating plan that works for you.  What foods do you eat on a low-carb diet?  On a low-carb diet, you  choose foods that are high in protein and fat. Examples of these are:  ?? Meat, poultry, and fish.  ?? Eggs.  ?? Nuts, such as walnuts, pecans, almonds, and peanuts.  ?? Peanut butter and other nut butters.  ?? Tofu.  ?? Avocado.  ?? Olives.  ?? Non-starchy vegetables like broccoli, cauliflower, green beans, mushrooms, peppers, lettuce, and spinach.  ?? Unsweetened non-dairy milks like almond milk and coconut milk.  ?? Cheese, cottage cheese, and cream cheese.  Current as of: September 14, 2017??????????????????????????????Content Version: 12.6  ?? 2006-2020 [employer]wise, [employer].   Care instructions adapted under license by [facility]. If you have questions about a medical condition or this instruction, always ask your healthcare professional. [employer]wise, Inc[employer] disclaims any warranty or liability for your use of this information.

## 2019-03-06 ENCOUNTER — Encounter

## 2019-03-06 ENCOUNTER — Encounter: Attending: Family Medicine | Primary: Family Medicine

## 2019-03-06 MED ORDER — TAMSULOSIN HCL 0.4 MG PO CAPS
0.4 MG | ORAL_CAPSULE | Freq: Every evening | ORAL | 0 refills | Status: DC
Start: 2019-03-06 — End: 2019-03-06

## 2019-03-06 MED ORDER — TAMSULOSIN HCL 0.4 MG PO CAPS
0.4 MG | ORAL_CAPSULE | Freq: Every evening | ORAL | 3 refills | Status: DC
Start: 2019-03-06 — End: 2019-04-02

## 2019-03-06 NOTE — Telephone Encounter (Signed)
sent 

## 2019-03-06 NOTE — Telephone Encounter (Signed)
Micheal [name] called requesting a refill on the following medications:  Requested Prescriptions     Pending Prescriptions Disp Refills   ??? tamsulosin (FLOMAX) 0.4 MG capsule [Pharmacy Med Name: Tamsulosin HCl 0.4 MG Oral Capsule] 90 capsule 0     Sig: TAKE 1 CAPSULE BY MOUTH NIGHTLY     Pharmacy verified:  .pv      Date of last visit: 01/08/2018  Date of next visit (if applicable): Patient declined the appointment and will have his PCP refill the medication.

## 2019-03-12 NOTE — Addendum Note (Signed)
Addended by: Jone Baseman on: 03/12/2019 11:12 AM     Modules accepted: Orders

## 2019-03-21 ENCOUNTER — Encounter

## 2019-03-21 MED ORDER — DIAZEPAM 5 MG PO TABS
5 MG | ORAL_TABLET | Freq: Three times a day (TID) | ORAL | 0 refills | Status: DC | PRN
Start: 2019-03-21 — End: 2019-04-16

## 2019-03-21 MED ORDER — ESOMEPRAZOLE MAGNESIUM 40 MG PO CPDR
40 MG | ORAL_CAPSULE | Freq: Every day | ORAL | 3 refills | Status: DC
Start: 2019-03-21 — End: 2019-09-29

## 2019-03-21 MED ORDER — OXYCODONE-ACETAMINOPHEN 10-325 MG PO TABS
10-325 MG | ORAL_TABLET | Freq: Three times a day (TID) | ORAL | 0 refills | Status: DC | PRN
Start: 2019-03-21 — End: 2019-04-16

## 2019-03-22 ENCOUNTER — Ambulatory Visit: Admit: 2019-03-22 | Discharge: 2019-03-22 | Payer: MEDICARE | Primary: Family Medicine

## 2019-04-02 ENCOUNTER — Ambulatory Visit: Admit: 2019-04-02 | Discharge: 2019-04-02 | Payer: MEDICARE | Attending: Family | Primary: Family Medicine

## 2019-04-02 DIAGNOSIS — J3089 Other allergic rhinitis: Secondary | ICD-10-CM

## 2019-04-02 MED ORDER — TAMSULOSIN HCL 0.4 MG PO CAPS
0.4 MG | ORAL_CAPSULE | Freq: Two times a day (BID) | ORAL | 3 refills | Status: DC
Start: 2019-04-02 — End: 2020-05-25

## 2019-04-02 MED ORDER — FLUTICASONE PROPIONATE 50 MCG/ACT NA SUSP
50 MCG/ACT | NASAL | 3 refills | Status: DC
Start: 2019-04-02 — End: 2019-10-03

## 2019-04-02 NOTE — Progress Notes (Signed)
[facility]  [facility]  [address]  [place] [address]  Dept: [phone]  Loc: [phone]  Visit Date: 04/02/2019      Chief Complaint:   Micheal [name] is a 64 y.o. male thatpresents for Follow-up (SOB and wheezing doing better with the inhaler)    HPI:  Follows up today shortness of breath and wheezing  Reports he is using the inhaler 1-2 times a week with significant improvement  Scheduled for PFTs in the next couple of weeks  Denies anymore wheezing  No shortness of breath or cough  He does c/o more nocturia, taking Flomax QAM  Daytime symptoms are well controlled  He comes in alone today.  History obtained from pt and medical records.  I have reviewed the patient's past medical history, past surgical history, allergies,medications, social and family history and I have made updates where appropriate.    Past Medical History:   Diagnosis Date   ??? Arthritis     general   ??? CAD (coronary artery disease)     s/p stent placement-sees Dr. Gabrielle Dare   ??? Chronic back pain    ??? Diabetes mellitus (HCC)    ??? GERD (gastroesophageal reflux disease)    ??? Hyperlipidemia    ??? Hypertension        Past Surgical History:   Procedure Laterality Date   ??? [place]   ??? COLONOSCOPY Left 12/19/2017    COLONOSCOPY POLYPECTOMY HOT BIOPSY [facility] Florian Buff[facility]   ??? CORONARY ARTERY BYPASS GRAFT  2009    [place]   ??? DIAGNOSTIC CARDIAC CATH LAB PROCEDURE     ??? ORTHOPEDIC SURGERY Left    ??? PTCA  07/04/2016   ??? TONSILLECTOMY         Social History     Tobacco Use   ??? Smoking status: Former Smoker     Packs/day: 1.00     Years: 15.00     Pack years: 15.00     Types: Cigarettes     Quit date: 05/24/2008     Years since quitting: 10.8   ??? Smokeless tobacco: Never Used   Substance Use Topics   ??? Alcohol use: Yes     Alcohol/week: 1.0 standard drinks     Types: 1 Glasses of wine per week     Comment: once monthly stopped a  over 2 years   ??? Drug use: No       Family History   Problem Relation Age of Onset   ??? Cancer Mother    ??? Heart Disease Brother    ??? Stroke Brother    ??? Diabetes Brother    ??? Early Death Brother    ??? High Blood Pressure Brother    ??? High Cholesterol Brother    ??? Mental Retardation Sister          Review of Systems  Constitutional: Negative for Fever, Chills, Fatigue  Cardiovascular:  Negative forChest Pain, Palpitations  Respiratory:  Negative for Cough, Wheezing, Shortness of breath  Gastrointestinal:  Nausea, Vomiting, Diarrhea, Constipation, Blood in stool  Genitourinary:  Negative for Difficulty or painful urination,Change in frequency, Urgency  Skin:  Negative for Color change, Rash, Itching, Wound  Psychiatric:  Negative forAnxiety, Depression, Suicidal ideation  Musculoskeletal:  Negative for Joint pain, Back pain, Gait problems  Neurological:  Negative for Dizziness, Headaches  PHYSICAL EXAM:  BP 120/67    Pulse 84    Temp 97.9 ??F (36.6 ??C) (Temporal)    Resp 16    Ht 5\' 7"  (1.702 m)    Wt 230 lb (104.3 kg)    SpO2 95%    BMI 36.02 kg/m??     General Appearance: well developed and well- nourished, in no acute distress  Head: normocephalic and atraumatic  Eyes: pupils equal, round, and reactive to light,  conjunctivae and eye lids without erythema  ENT: external ear normal bilaterally, nose without deformity, nasal mucosa and turbinates normal without polyps, oropharynx normal, dentition is normal for age, no lip or gum lesions noted  Neck: supple and non-tender without mass  Pulmonary/Chest: clear to auscultation bilaterally- no wheezes, rales or rhonchi, normal air movement, no respiratory distress orretractions  Cardiovascular: normal rate, regular rhythm, normal S1 and S2, no murmurs, rubs, clicks, or gallops,distal pulses intact  Abdomen: soft, non-tender, non-distended, normal bowel sounds  Extremities: no cyanosis, clubbing or edema of the lower extremities  Musculoskeletal: No joint swelling or  gross deformity   Neuro:  Alert, 5/5 strength globally and symmetrically, normal speech, no focal findings or movement disorder noted, gait wnl  Psych:  Normal affect without evidence of depression or anxiety, insight and judgement are appropriate, memory appears intact  Skin: warm and dry, no rash or erythema  Lymph:  No cervical, auricular or supraclavicular lymph nodes palpated    ASSESSMENT & PLAN  Mattis was seen today for follow-up.    Diagnoses and all orders for this visit:    Chronic non-seasonal allergic rhinitis  -     fluticasone (FLONASE) 50 MCG/ACT nasal spray; Use 2 spray(s) in each nostril once daily    Benign prostatic hyperplasia with urinary frequency   -     tamsulosin (FLOMAX) 0.4 MG capsule; Take 1 capsule by mouth 2 times daily    Start Flomax BID  RTO in 3 mos for recheck with A1C    Return in about 3 months (around 07/03/2019).    Jordyn received counseling on the following healthy behaviors: nutrition, exercise and medication adherence  Reviewedprior labs and health maintenance.  Continue current medications, diet and exercise.  Discussed use, benefit, and side effects of prescribed medications. Barriers to medication compliance addressed.   Patient given educationalmaterials - see patient instructions.    All patient questions answered.  Patient voiced understanding.     Electronically signed by Antony Contras, APRN on 04/02/19 at 2:00 PM EST

## 2019-04-03 ENCOUNTER — Telehealth

## 2019-04-03 NOTE — Telephone Encounter (Signed)
Velna Hatchet with the [facility] function lab is requesting a covid order prior to pt's test  Velna Hatchet will get the covid order from epic,and she will inform the pt  Future Appointments   Date Time Provider Department Center   04/10/2019  7:00 AM [facility]ULMONARY FUNCTION ROOM 1 [facility] PFT [place] [facility]   04/12/2019  9:10 AM [facility] [place] [facility], PFIZER 21 DAY SECOND DOSE [place] [facility] [place]   07/02/2019  2:20 PM Jone Baseman, APRN - CNP [place] [facility] [place]   11/06/2019 10:45 AM Zoheir Derl Barrow, MD N [facility] - [place]   12/10/2019 12:40 PM [name], DO [place] [facility] - [name]

## 2019-04-03 NOTE — Telephone Encounter (Signed)
Order placed

## 2019-04-05 ENCOUNTER — Inpatient Hospital Stay: Payer: MEDICARE | Primary: Family Medicine

## 2019-04-05 DIAGNOSIS — Z20822 Contact with and (suspected) exposure to covid-19: Secondary | ICD-10-CM

## 2019-04-07 LAB — COVID-19 AMBULATORY: SARS-CoV-2: NOT DETECTED

## 2019-04-10 ENCOUNTER — Inpatient Hospital Stay: Admit: 2019-04-10 | Payer: MEDICARE | Primary: Family Medicine

## 2019-04-10 DIAGNOSIS — R062 Wheezing: Secondary | ICD-10-CM

## 2019-04-10 NOTE — Progress Notes (Signed)
Prescreening performed prior to testing.  The following symptoms may indicate COVID-19 infection:        One of the following criteria:   Temperature taken, patient temperature was 96.7 F.   Fever greater 100.0 F -no  Cough -  no  New onset shortness of breath -no  New onset difficulty breathing -no        And/or   Two or more of the following criteria:  Muscle aches -no  Headache -no  Sore throat -no  New onset loss of smell/taste -no  New onset diarrhea -no    Patient's screening was negative. PFT will be performed.

## 2019-04-12 ENCOUNTER — Ambulatory Visit: Admit: 2019-04-12 | Discharge: 2019-04-12 | Payer: MEDICARE | Primary: Family Medicine

## 2019-04-16 ENCOUNTER — Encounter

## 2019-04-16 MED ORDER — DIAZEPAM 5 MG PO TABS
5 MG | ORAL_TABLET | Freq: Three times a day (TID) | ORAL | 0 refills | Status: DC | PRN
Start: 2019-04-16 — End: 2019-05-16

## 2019-04-16 MED ORDER — OXYCODONE-ACETAMINOPHEN 10-325 MG PO TABS
10-325 MG | ORAL_TABLET | Freq: Three times a day (TID) | ORAL | 0 refills | Status: DC | PRN
Start: 2019-04-16 — End: 2019-05-16

## 2019-05-05 ENCOUNTER — Encounter

## 2019-05-06 ENCOUNTER — Encounter

## 2019-05-06 MED ORDER — PREGABALIN 50 MG PO CAPS
50 MG | ORAL_CAPSULE | ORAL | 0 refills | Status: DC
Start: 2019-05-06 — End: 2019-08-12

## 2019-05-06 MED ORDER — MONTELUKAST SODIUM 10 MG PO TABS
10 MG | ORAL_TABLET | Freq: Every evening | ORAL | 3 refills | Status: DC
Start: 2019-05-06 — End: 2020-05-18

## 2019-05-06 MED ORDER — ISOSORBIDE MONONITRATE ER 30 MG PO TB24
30 MG | ORAL_TABLET | Freq: Every day | ORAL | 2 refills | Status: DC
Start: 2019-05-06 — End: 2020-02-24

## 2019-05-06 MED ORDER — METOPROLOL SUCCINATE ER 25 MG PO TB24
25 MG | ORAL_TABLET | ORAL | 1 refills | Status: DC
Start: 2019-05-06 — End: 2019-11-05

## 2019-05-16 ENCOUNTER — Encounter

## 2019-05-16 MED ORDER — OXYCODONE-ACETAMINOPHEN 10-325 MG PO TABS
10-325 MG | ORAL_TABLET | Freq: Three times a day (TID) | ORAL | 0 refills | Status: DC | PRN
Start: 2019-05-16 — End: 2019-06-13

## 2019-05-16 MED ORDER — DIAZEPAM 5 MG PO TABS
5 MG | ORAL_TABLET | Freq: Three times a day (TID) | ORAL | 1 refills | Status: DC | PRN
Start: 2019-05-16 — End: 2019-07-11

## 2019-06-13 ENCOUNTER — Encounter

## 2019-06-13 MED ORDER — OXYCODONE-ACETAMINOPHEN 10-325 MG PO TABS
10-325 MG | ORAL_TABLET | Freq: Three times a day (TID) | ORAL | 0 refills | Status: DC | PRN
Start: 2019-06-13 — End: 2019-07-11

## 2019-07-02 ENCOUNTER — Ambulatory Visit: Admit: 2019-07-02 | Discharge: 2019-07-02 | Payer: MEDICARE | Attending: Family | Primary: Family Medicine

## 2019-07-02 DIAGNOSIS — E119 Type 2 diabetes mellitus without complications: Secondary | ICD-10-CM

## 2019-07-02 LAB — POCT GLYCOSYLATED HEMOGLOBIN (HGB A1C): Hemoglobin A1C: 9.5 % — ABNORMAL HIGH (ref 4.3–5.7)

## 2019-07-02 MED ORDER — ALBUTEROL SULFATE HFA 108 (90 BASE) MCG/ACT IN AERS
108 (90 Base) MCG/ACT | Freq: Four times a day (QID) | RESPIRATORY_TRACT | 2 refills | Status: DC | PRN
Start: 2019-07-02 — End: 2021-01-11

## 2019-07-02 NOTE — Patient Instructions (Signed)
Patient Education        Learning About Low-Carbohydrate Foods  What foods are low in carbohydrate?     The foods you eat contain nutrients, such as vitamins and minerals. Carbohydrate is a nutrient. Your body needs the right amount to stay healthy and work as it should. You can use the list below to help you make choices about which foods to eat.  Some foods that are lower in carbohydrate include:  Dairy and dairy alternatives  ?? Cheese  ?? Cottage cheese  ?? Cream cheese  ?? Nut milk (unsweetened)  ?? Soy milk (unsweetened)  ?? Yogurt (Greek, plain)  Fruits  ?? Avocado  ?? Olives  Meats and other protein foods  ?? Almonds  ?? Beef  ?? Chicken  ?? Cod  ?? Eggs  ?? Halibut  ?? Peanut butter and other nut butters  ?? Pistachios  ?? Pork  ?? Pumpkin seeds  ?? Tofu  ?? Trout  ?? Tuna  ?? Turkey  ?? Walnuts  Vegetables  ?? Broccoli  ?? Carrots  ?? Cauliflower  ?? Green beans  ?? Mushrooms  ?? Peppers  ?? Salad greens  ?? Spinach  ?? Tomatoes  Work with your doctor to find out how much of this nutrient you need. Depending on your health, you may need more or less of it in your diet.  Where can you learn more?  Go to [email or web address] and sign in to your MyChart account. Enter C470 in the Search Health Information box to learn more about "Learning About Low-Carbohydrate Foods."     If you do not have an account, please click on the "Sign Up Now" link.  Current as of: January 10, 2019??????????????????????????????Content Version: 12.8  ?? 2006-2021 [employer].   Care instructions adapted under license by [facility]. If you have questions about a medical condition or this instruction, always ask your healthcare professional. [employer] disclaims any warranty or liability for your use of this information.         Patient Education        Learning About Low-Carbohydrate Diets  What is a low-carbohydrate diet?     A low-carbohydrate (or "low-carb") diet limits foods and drinks that have carbohydrates. This includes  grains, fruits, milk and yogurt, and starchy vegetables like potatoes, beans, and corn. It also avoids foods and drinks that have added sugar. Instead, low-carb diets include foods that are high in protein and fat.  Why might you follow a low-carb diet?  Low-carb diets may be used for a variety of reasons, such as for weight loss. People who have diabetes may use a low-carb diet to help manage their blood sugar levels.  What should you do before you start the diet?  Talk to your doctor before you try any diet. This is even more important if you have health problems like kidney disease, heart disease, or diabetes. Your doctor may suggest that you meet with a registered dietitian. A dietitian can help you make an eating plan that works for you.  What foods do you eat on a low-carb diet?  On a low-carb diet, you choose foods that are high in protein and fat. Examples of these are:  ?? Meat, poultry, and fish.  ?? Eggs.  ?? Nuts, such as walnuts, pecans, almonds, and peanuts.  ?? Peanut butter and other nut butters.  ?? Tofu.  ?? Avocado.  ?? Olives.  ?? Non-starchy vegetables like broccoli, cauliflower, green beans, mushrooms, peppers, lettuce, and   spinach.  ?? Unsweetened non-dairy milks like almond milk and coconut milk.  ?? Cheese, cottage cheese, and cream cheese.  Current as of: January 10, 2019??????????????????????????????Content Version: 12.8  ?? 2006-2021 [employer].   Care instructions adapted under license by [facility]. If you have questions about a medical condition or this instruction, always ask your healthcare professional. [employer] disclaims any warranty or liability for your use of this information.

## 2019-07-02 NOTE — Progress Notes (Signed)
[facility]  [facility]  [address]  [place] [address]  Dept: [phone]  Loc: [phone]  Visit Date: 07/02/2019      Chief Complaint:   Micheal [name] is a 64 y.o. male thatpresents for Follow-up (Diabetes) and Lower Back Pain (leg pain)    HPI:  Diabetes Type 2    Glucose control:   Does patient check blood glucoses at home?  No  Report of hypoglycemia: no  Lab Results   Component Value Date    LABA1C 9.5 (H) 07/02/2019     No results found for: EAG    Symptoms  Polyuria, Polydipsia or Polyphagia?   Yes - polydipsia  Chest Pain, SOB, or Palpitations? -  No  New Vision complaints? No  Paresthesias of the extremities?  No    Medications  Current medication were reviewed.  Compliant with medications?  yes  Medication side effects?  No  On ACE-I or ARB?  Yes - Lisinopril  On antiplatelet therapy?  Yes - Brillinta  On Statin?  Yes - Atorvastatin    Last Diabetic Eye Exam: UTD    Exercise  Exercise? No  Wt Readings from Last 3 Encounters:   07/02/19 228 lb (103.4 kg)   04/02/19 230 lb (104.3 kg)   03/05/19 228 lb 9.6 oz (103.7 kg)       Diet discipline?:  Low salt, fat, sugar diet?  No.  Drinking 6 sodas a day per pt.      Blood pressure control:  BP Readings from Last 3 Encounters:   07/02/19 132/88   04/02/19 120/67   03/05/19 128/78       Lab Results   Component Value Date    LABMICR 1.28 09/03/2014       Lab Results   Component Value Date    LDLCALC 92 12/17/2018     The patient comes in alone today.  History obtained from pt and medical records.  I have reviewed the patient's past medical history, past surgical history, allergies,medications, social and family history and I have made updates where appropriate.    Past Medical History:   Diagnosis Date   ??? Arthritis     general   ??? CAD (coronary artery disease)     s/p stent placement-sees Dr. Gabrielle Dare   ??? Chronic back pain    ??? Diabetes mellitus (HCC)    ??? GERD (gastroesophageal reflux disease)    ??? Hyperlipidemia    ???  Hypertension        Past Surgical History:   Procedure Laterality Date   ??? [place]  [place]   ??? COLONOSCOPY Left 12/19/2017    COLONOSCOPY POLYPECTOMY HOT BIOPSY performed by Florian Buff, MD at [facility]   ??? CORONARY ARTERY BYPASS GRAFT  2009    6 [place]   ??? DIAGNOSTIC CARDIAC CATH LAB PROCEDURE     ??? ORTHOPEDIC SURGERY Left    ??? PTCA  07/04/2016   ??? TONSILLECTOMY         Social History     Tobacco Use   ??? Smoking status: Former Smoker     Packs/day: 1.00     Years: 15.00     Pack years: 15.00     Types: Cigarettes     Quit date: 05/24/2008     Years since quitting: 11.1   ??? Smokeless tobacco: Never Used   Vaping Use   ???  Vaping Use: Never used   Substance Use Topics   ??? Alcohol use: Yes     Alcohol/week: 1.0 standard drinks     Types: 1 Glasses of wine per week     Comment: once monthly stopped a over 2 years   ??? Drug use: No       Family History   Problem Relation Age of Onset   ??? Cancer Mother    ??? Heart Disease Brother    ??? Stroke Brother    ??? Diabetes Brother    ??? Early Death Brother    ??? High Blood Pressure Brother    ??? High Cholesterol Brother    ??? Mental Retardation Sister          Review of Systems   Constitutional: Negative.  Negative for chills, fatigue and fever.   HENT: Negative for ear pain, mouth sores, sore throat and trouble swallowing.    Eyes: Negative for pain, discharge, redness and visual disturbance.   Respiratory: Negative for cough, shortness of breath and wheezing.    Cardiovascular: Negative for chest pain, palpitations and leg swelling.   Gastrointestinal: Negative for abdominal pain, constipation, diarrhea, nausea and vomiting.   Endocrine: Negative for cold intolerance and heat intolerance.   Genitourinary: Negative for dysuria, frequency and urgency.   Musculoskeletal: Positive for back pain. Negative for arthralgias, gait problem and joint swelling.   Skin: Negative for color change, rash and wound.   Neurological: Negative for  dizziness, weakness and headaches.   Psychiatric/Behavioral: Negative for agitation, confusion and hallucinations.             PHYSICAL EXAM:  BP 132/88    Pulse 79    Temp 97 ??F (36.1 ??C) (Infrared)    Resp 18    Ht 5\' 7"  (1.702 m)    Wt 228 lb (103.4 kg)    SpO2 95%    BMI 35.71 kg/m??     Physical Exam  Constitutional:       Appearance: Normal appearance.   HENT:      Head: Normocephalic and atraumatic.      Right Ear: External ear normal.      Left Ear: External ear normal.      Nose: Nose normal.      Mouth/Throat:      Mouth: Mucous membranes are moist.   Eyes:      Conjunctiva/sclera: Conjunctivae normal.   Cardiovascular:      Rate and Rhythm: Normal rate and regular rhythm.      Pulses: Normal pulses.      Heart sounds: Normal heart sounds.   Pulmonary:      Effort: Pulmonary effort is normal.      Breath sounds: Normal breath sounds.   Abdominal:      General: Bowel sounds are normal.      Palpations: Abdomen is soft.   Musculoskeletal:         General: Normal range of motion.      Cervical back: Normal range of motion.   Skin:     General: Skin is warm and dry.   Neurological:      General: No focal deficit present.      Mental Status: He is alert and oriented to person, place, and time.   Psychiatric:         Mood and Affect: Mood normal.         Behavior: Behavior normal.  ASSESSMENT & PLAN  Micheal [name] was seen today for follow-up and lower back pain.    Diagnoses and all orders for this visit:    Type 2 diabetes mellitus without complication, without long-term current use of insulin (HCC)    Wheezing    Chronic bilateral low back pain with bilateral sciatica    Other orders  -     POCT glycosylated hemoglobin (Hb A1C)    A1C has worsened  Says he is going to start back at the gym  Would like 3 more mos to change his diet and start exercising  If no improvement, will need some med changes, verbalized understanding  For now, continue Metformin and Glimepride  Stop drinking 6 sodas a day  Limit  starches and sugary foods    Return in about 3 months (around 10/02/2019).    Micheal [name] received counseling on the following healthy behaviors: nutrition, exercise and medication adherence  Reviewedprior labs and health maintenance.  Continue current medications, diet and exercise.  Discussed use, benefit, and side effects of prescribed medications. Barriers to medication compliance addressed.   Patient given educationalmaterials - see patient instructions.    All patient questions answered.  Patient voiced understanding.     Electronically signed by Jone Baseman, APRN - CNP, APRN on 07/02/19 at 2:36 PM EDT

## 2019-07-11 ENCOUNTER — Encounter

## 2019-07-11 MED ORDER — OXYCODONE-ACETAMINOPHEN 10-325 MG PO TABS
10-325 MG | ORAL_TABLET | Freq: Three times a day (TID) | ORAL | 0 refills | Status: DC | PRN
Start: 2019-07-11 — End: 2019-08-08

## 2019-07-11 MED ORDER — DIAZEPAM 5 MG PO TABS
5 MG | ORAL_TABLET | Freq: Three times a day (TID) | ORAL | 1 refills | Status: DC | PRN
Start: 2019-07-11 — End: 2019-08-08

## 2019-08-05 ENCOUNTER — Encounter

## 2019-08-05 MED ORDER — LISINOPRIL 40 MG PO TABS
40 MG | ORAL_TABLET | ORAL | 3 refills | Status: DC
Start: 2019-08-05 — End: 2020-08-11

## 2019-08-08 ENCOUNTER — Encounter

## 2019-08-08 MED ORDER — DIAZEPAM 5 MG PO TABS
5 MG | ORAL_TABLET | Freq: Three times a day (TID) | ORAL | 1 refills | Status: DC | PRN
Start: 2019-08-08 — End: 2019-09-05

## 2019-08-08 MED ORDER — OXYCODONE-ACETAMINOPHEN 10-325 MG PO TABS
10-325 MG | ORAL_TABLET | Freq: Three times a day (TID) | ORAL | 0 refills | Status: DC | PRN
Start: 2019-08-08 — End: 2019-09-05

## 2019-08-08 NOTE — Telephone Encounter (Signed)
This medication refill is regarding a MyChart request.  Refill requested by patient.    Requested Prescriptions     Pending Prescriptions Disp Refills   ??? oxyCODONE-acetaminophen (PERCOCET) 10-325 MG per tablet 90 tablet 0     Sig: Take 1 tablet by mouth every 8 hours as needed for Pain for up to 30 days.   ??? diazePAM (VALIUM) 5 MG tablet 90 tablet 1     Sig: Take 1 tablet by mouth every 8 hours as needed for Anxiety for up to 30 days.       Date of last visit: 07/02/2019  Date of next visit: 10/02/2019  Date of last refill: Percocet 07/11/2019  90/0  Valium 07/11/2019  90/1      Rx verified, ordered and set to EP.

## 2019-08-08 NOTE — Telephone Encounter (Signed)
Error

## 2019-08-12 ENCOUNTER — Encounter

## 2019-08-12 MED ORDER — PREGABALIN 50 MG PO CAPS
50 MG | ORAL_CAPSULE | ORAL | 5 refills | Status: DC
Start: 2019-08-12 — End: 2020-03-02

## 2019-08-13 ENCOUNTER — Ambulatory Visit: Admit: 2019-08-13 | Discharge: 2019-08-13 | Payer: MEDICARE | Attending: Family Medicine | Primary: Family Medicine

## 2019-08-13 DIAGNOSIS — L0291 Cutaneous abscess, unspecified: Secondary | ICD-10-CM

## 2019-08-13 MED ORDER — DOXYCYCLINE HYCLATE 100 MG PO TABS
100 MG | ORAL_TABLET | Freq: Two times a day (BID) | ORAL | 0 refills | Status: AC
Start: 2019-08-13 — End: 2019-08-23

## 2019-08-13 NOTE — Progress Notes (Signed)
Micheal [name] is a 64 y.o. male that presents for     Chief Complaint   Patient presents with   ??? Insect Bite     right pointer finger          HPI    Skin Lesion    HPI:    Location - right index finger  Length of time it has been present - 2 days  Recent change in size, color or shape? - Yes - started as a pimple, he squeezed this yesterday and had clear drainage.    Pruritic?  No  Bleeding or drainage?  Yes - mostly clear drainage  Painful?  Yes - having tenderness  No fever      Health Maintenance   Topic Date Due   ??? AAA screen  Never done   ??? Pneumococcal 65+ years Vaccine (2 of 2 - PPSV23) 06/12/2019   ??? Diabetic retinal exam  09/25/2019 (Originally 06/11/1965)   ??? DTaP/Tdap/Td vaccine (1 - Tdap) 03/04/2020 (Originally 06/11/1974)   ??? Flu vaccine (1) 09/25/2019   ??? A1C test (Diabetic or Prediabetic)  10/02/2019   ??? Diabetic foot exam  12/04/2019   ??? Annual Wellness Visit (AWV)  12/05/2019   ??? Lipid screen  12/17/2019   ??? Potassium monitoring  12/17/2019   ??? Creatinine monitoring  12/17/2019   ??? Diabetic microalbuminuria test  03/04/2020   ??? Colon cancer screen colonoscopy  12/20/2022   ??? Shingles Vaccine  Completed   ??? COVID-19 Vaccine  Completed   ??? Hepatitis C screen  Completed   ??? HIV screen  Completed   ??? Hepatitis A vaccine  Aged Out   ??? Hib vaccine  Aged Out   ??? Meningococcal (ACWY) vaccine  Aged Out       Past Medical History:   Diagnosis Date   ??? Arthritis     general   ??? CAD (coronary artery disease)     s/p stent placement-sees Dr. Gabrielle Dare   ??? Chronic back pain    ??? Diabetes mellitus (HCC)    ??? GERD (gastroesophageal reflux disease)    ??? Hyperlipidemia    ??? Hypertension        Past Surgical History:   Procedure Laterality Date   ??? [place]  2015    [place]   ??? COLONOSCOPY Left 12/19/2017    COLONOSCOPY POLYPECTOMY HOT BIOPSY performed by Florian Buff, [facility] at [facility]   ??? CORONARY ARTERY BYPASS GRAFT  2009    [place]   ??? DIAGNOSTIC CARDIAC CATH LAB PROCEDURE      ??? ORTHOPEDIC SURGERY Left    ??? PTCA  07/04/2016   ??? TONSILLECTOMY         Social History     Tobacco Use   ??? Smoking status: Former Smoker     Packs/day: 1.00     Years: 15.00     Pack years: 15.00     Types: Cigarettes     Quit date: 05/24/2008     Years since quitting: 11.2   ??? Smokeless tobacco: Never Used   Vaping Use   ??? Vaping Use: Never used   Substance Use Topics   ??? Alcohol use: Yes     Alcohol/week: 1.0 standard drinks     Types: 1 Glasses of wine per week     Comment: once monthly stopped a over 2 years   ??? Drug use: No       Family History  Problem Relation Age of Onset   ??? Cancer Mother    ??? Heart Disease Brother    ??? Stroke Brother    ??? Diabetes Brother    ??? Early Death Brother    ??? High Blood Pressure Brother    ??? High Cholesterol Brother    ??? Mental Retardation Sister          I have reviewed the patient's past medical history, past surgical history, allergies, medications, social and family history and I have made updates where appropriate.    Review of Systems        PHYSICAL EXAM:  Wt 227 lb (103 kg)    BMI 35.55 kg/m??     Physical Exam  Nursing note reviewed.   Constitutional:       Appearance: He is well-developed.   Pulmonary:      Effort: Pulmonary effort is normal. No respiratory distress.   Skin:     Findings: Erythema (on the dorsal right index finger, there is a patch of erythema with a small central area of ulceration.  No drainage, area is tender.) present.   Neurological:      Mental Status: He is alert.   Psychiatric:         Behavior: Behavior normal.         Thought Content: Thought content normal.         Judgment: Judgment normal.             ASSESSMENT & PLAN  Micheal [name] was seen today for insect bite.    Diagnoses and all orders for this visit:    Abscess  -     doxycycline hyclate (VIBRA-TABS) 100 MG tablet; Take 1 tablet by mouth 2 times daily for 10 days    -Appears to have a small abscess with cellulitis, advised on conservative care, start doxycycline, will let me know if any  worsening or persistent sx.    Return if symptoms worsen or fail to improve.    The most recent results of the following tests were reviewed with the patient today: none     All copied or forwarded information in the progress note was verified by me to be accurate at the time of visit  Patient's past medical, surgical, social and family history were reviewed and updated     All patient questions answered.  Patient voiced understanding.

## 2019-09-05 ENCOUNTER — Encounter

## 2019-09-05 MED ORDER — OXYCODONE-ACETAMINOPHEN 10-325 MG PO TABS
10-325 MG | ORAL_TABLET | Freq: Three times a day (TID) | ORAL | 0 refills | Status: DC | PRN
Start: 2019-09-05 — End: 2019-10-02

## 2019-09-05 MED ORDER — DIAZEPAM 5 MG PO TABS
5 MG | ORAL_TABLET | Freq: Three times a day (TID) | ORAL | 0 refills | Status: DC | PRN
Start: 2019-09-05 — End: 2019-10-02

## 2019-09-05 NOTE — Telephone Encounter (Signed)
Patient calling to request new rx for the following  Please approve or refuse Rx request:  Requested Prescriptions     Pending Prescriptions Disp Refills   ??? diazePAM (VALIUM) 5 MG tablet 90 tablet 1     Sig: Take 1 tablet by mouth every 8 hours as needed for Anxiety for up to 30 days.   ??? oxyCODONE-acetaminophen (PERCOCET) 10-325 MG per tablet 90 tablet 0     Sig: Take 1 tablet by mouth every 8 hours as needed for Pain for up to 30 days.       Next appointment:  10/02/2019

## 2019-09-29 ENCOUNTER — Encounter

## 2019-09-30 MED ORDER — GLIMEPIRIDE 4 MG PO TABS
4 MG | ORAL_TABLET | ORAL | 3 refills | Status: DC
Start: 2019-09-30 — End: 2020-10-05

## 2019-10-01 MED ORDER — ESOMEPRAZOLE MAGNESIUM 40 MG PO CPDR
40 MG | ORAL_CAPSULE | Freq: Every day | ORAL | 3 refills | Status: DC
Start: 2019-10-01 — End: 2020-08-24

## 2019-10-02 ENCOUNTER — Ambulatory Visit: Admit: 2019-10-02 | Discharge: 2019-10-02 | Payer: MEDICARE | Attending: Family | Primary: Family Medicine

## 2019-10-02 DIAGNOSIS — E119 Type 2 diabetes mellitus without complications: Secondary | ICD-10-CM

## 2019-10-02 LAB — POCT GLYCOSYLATED HEMOGLOBIN (HGB A1C): Hemoglobin A1C: 9.7 % — ABNORMAL HIGH (ref 4.3–5.7)

## 2019-10-02 MED ORDER — PIOGLITAZONE HCL 15 MG PO TABS
15 MG | ORAL_TABLET | Freq: Every day | ORAL | 3 refills | Status: DC
Start: 2019-10-02 — End: 2020-01-26

## 2019-10-02 MED ORDER — OXYCODONE-ACETAMINOPHEN 10-325 MG PO TABS
10-325 MG | ORAL_TABLET | Freq: Three times a day (TID) | ORAL | 0 refills | Status: DC | PRN
Start: 2019-10-02 — End: 2019-10-30

## 2019-10-02 MED ORDER — DIAZEPAM 5 MG PO TABS
5 MG | ORAL_TABLET | Freq: Three times a day (TID) | ORAL | 0 refills | Status: DC | PRN
Start: 2019-10-02 — End: 2019-10-30

## 2019-10-02 NOTE — Progress Notes (Signed)
Micheal [name] is a 64 y.o. male for Follow-up (diabetes) and Lower Back Pain      Diabetes Type 2    Glucose control:   Does patient check blood glucoses at home?  No  Report of hypoglycemia: no  Lab Results   Component Value Date    LABA1C 9.7 (H) 10/02/2019     No results found for: EAG    Symptoms  Polyuria, Polydipsia or Polyphagia?   No  Chest Pain, SOB, or Palpitations? -  No  New Vision complaints? No  Paresthesias of the extremities?  No    Medications  Current medication were reviewed.  Compliant with medications?  yes  Medication side effects?  No  On ACE-I or ARB?  Yes  On antiplatelet therapy?  Yes  On Statin?  Yes    Last Diabetic Eye Exam: UTD    Exercise  Exercise? No  Wt Readings from Last 3 Encounters:   10/02/19 222 lb 3.2 oz (100.8 kg)   08/13/19 227 lb (103 kg)   07/02/19 228 lb (103.4 kg)       Diet discipline?:  Low salt, fat, sugar diet?  no    Blood pressure control:  BP Readings from Last 3 Encounters:   10/02/19 (!) 144/92   07/02/19 132/88   04/02/19 120/67       Lab Results   Component Value Date    LABMICR 1.28 09/03/2014       Lab Results   Component Value Date    LDLCALC 92 12/17/2018       Physical Exam    BP (!) 144/92    Pulse 96    Temp 97.6 ??F (36.4 ??C) (Infrared)    Resp 16    Ht 5\' 7"  (1.702 m)    Wt 222 lb 3.2 oz (100.8 kg)    SpO2 93%    BMI 34.80 kg/m??   Appearance: alert, well appearing, and in no distress, normal appearing weight and well hydrated.  Neck exam - supple, no significant adenopathy.  CVS exam: normal rate, regular rhythm, normal S1, S2, no murmurs, rubs, clicks or gallops.  Lungs: clear to auscultation, no wheezes, rales or rhonchi, symmetric air entry.  Abdomen:  BS normal, soft, non tender, non distended, no rebound or guarding  Mental Status: normal mood, affect behavior, speech, dress,  and thought processes.  Neuro:  Alert, muscle tone grossly normal  Skin exam: normal coloration and turgor, no rashes, no suspicious skin lesions noted.  Foot exam:  No  pedal edema, no erythematous lesions noted b/l, sensation wnl b/l, PT and TP pulses wnl b/l    ASSESSMENT & PLAN  Micheal [name] was seen today for follow-up and lower back pain.    Diagnoses and all orders for this visit:    Type 2 diabetes mellitus without complication, without long-term current use of insulin (HCC)  -     CBC Auto Differential; Future  -     Comprehensive Metabolic Panel; Future  -     Lipid Panel; Future  -     pioglitazone (ACTOS) 15 MG tablet; Take 1 tablet by mouth daily    Chronic bilateral low back pain with bilateral sciatica  -     oxyCODONE-acetaminophen (PERCOCET) 10-325 MG per tablet; Take 1 tablet by mouth every 8 hours as needed for Pain for up to 30 days.    GAD (generalized anxiety disorder)  -     diazePAM (VALIUM) 5 MG tablet; Take 1  tablet by mouth every 8 hours as needed for Anxiety for up to 30 days.    Other orders  -     POCT glycosylated hemoglobin (Hb A1C)        No follow-ups on file.    Electronically signed by Jone Baseman, APRN - CNP on 10/02/2019 at 10:46 AM

## 2019-10-02 NOTE — Patient Instructions (Addendum)
Patient Education        Learning About Low-Carbohydrate Foods  What foods are low in carbohydrate?     The foods you eat contain nutrients, such as vitamins and minerals. Carbohydrate is a nutrient. Your body needs the right amount to stay healthy and work as it should. You can use the list below to help you make choices about which foods to eat.  Some foods that are lower in carbohydrate include:  Dairy and dairy alternatives  ?? Cheese  ?? Cottage cheese  ?? Cream cheese  ?? Nut milk (unsweetened)  ?? Soy milk (unsweetened)  ?? Yogurt (Greek, plain)  Fruits  ?? Avocado  ?? [employer] and other protein foods  ?? Almonds  ?? Beef  ?? Chicken  ?? [place]  ?? Eggs  ?? [place]  ?? Peanut butter and other nut butters  ?? Pistachios  ?? Pork  ?? Pumpkin seeds  ?? Tofu  ?? Trout  ?? [place]  ?? [place]  ?? Walnuts  Vegetables  ?? Broccoli  ?? Carrots  ?? Cauliflower  ?? Green beans  ?? Mushrooms  ?? Peppers  ?? Salad greens  ?? Spinach  ?? Tomatoes  Work with your doctor to find out how much of this nutrient you need. Depending on your health, you may need more or less of it in your diet.  Where can you learn more?  Go to [email or web address] and sign in to your MyChart account. Enter C470 in the [place] box to learn more about "Learning About Low-Carbohydrate Foods."     If you do not have an account, please click on the "Sign Up Now" link.  Current as of: January 10, 2019??????????????????????????????Content Version: 12.9  ?? 2006-2021 Healthwise, Incorporated.   Care instructions adapted under license by [facility]. If you have questions about a medical condition or this instruction, always ask your healthcare professional. [place] any warranty or liability for your use of this information.         Patient Education        Learning About Low-Carbohydrate Diets  What is a low-carbohydrate diet?     A low-carbohydrate (or "low-carb") diet limits foods and drinks that have carbohydrates. This includes  grains, fruits, milk and yogurt, and starchy vegetables like potatoes, beans, and corn. It also avoids foods and drinks that have added sugar. Instead, low-carb diets include foods that are high in protein and fat.  Why might you follow a low-carb diet?  Low-carb diets may be used for a variety of reasons, such as for weight loss. People who have diabetes may use a low-carb diet to help manage their blood sugar levels.  What should you do before you start the diet?  Talk to your doctor before you try any diet. This is even more important if you have health problems like kidney disease, heart disease, or diabetes. Your doctor may suggest that you meet with a registered dietitian. A dietitian can help you make an eating plan that works for you.  What foods do you eat on a low-carb diet?  On a low-carb diet, you choose foods that are high in protein and fat. Examples of these are:  ?? Meat, poultry, and fish.  ?? Eggs.  ?? Nuts, such as walnuts, pecans, almonds, and peanuts.  ?? Peanut butter and other nut butters.  ?? Tofu.  ?? Avocado.  ?? Olives.  ?? Non-starchy vegetables like broccoli, cauliflower, green beans, mushrooms, peppers, lettuce, and  spinach.  ?? Unsweetened non-dairy milks like almond milk and coconut milk.  ?? Cheese, cottage cheese, and cream cheese.  Current as of: January 10, 2019??????????????????????????????Content Version: 12.9  ?? 2006-2021 Healthwise, Incorporated.   Care instructions adapted under license by [facility]. If you have questions about a medical condition or this instruction, always ask your healthcare professional. [place] any warranty or liability for your use of this information.         Patient Education        pioglitazone (oral)  Pronunciation:  PYE o GLIT a zone  Brand:  Actos  What is the most important information I should know about pioglitazone?  Pioglitazone can cause or worsen congestive heart failure. You should not use this medicine if you have severe or  uncontrolled heart failure.  Stop using this medicine and call your doctor at once if you have shortness of breath (especially when lying down), unusual tiredness, swelling, or rapid weight gain.  What is pioglitazone?  Pioglitazone is used together with diet and exercise to improve blood sugar control in adults with type 2 diabetes mellitus. Pioglitazone is not for treating type 1 diabetes.  Pioglitazone may also be used for purposes not listed in this medication guide.  What should I discuss with my healthcare provider before taking pioglitazone?  You should not use this medicine if you are allergic to pioglitazone, or if you have:  ?? severe or uncontrolled heart failure; or  ?? diabetic ketoacidosis (call your doctor for treatment).  This medication may increase your risk of developing bladder cancer. Talk with your doctor about your specific risk.  Tell your doctor if you have ever had:  ?? congestive heart failure or heart disease;  ?? a heart attack or stroke;  ?? eye problems caused by diabetes;  ?? bladder cancer; or  ?? liver disease.  Pioglitazone may increase your risk of serious heart problems, but not treating your diabetes can also damage your heart and other organs. Talk to your doctor about the risks and benefits of this medicine.  Follow your doctor's instructions about using this medicine if you are pregnant or you become pregnant. Controlling diabetes is very important during pregnancy, and having high blood sugar may cause complications in both the mother and the baby.  Pioglitazone may stimulate ovulation in a premenopausal woman and may increase the risk of unintended pregnancy. Talk to your doctor about your risk.  Women may be more likely to have a broken bone while using pioglitazone. Talk with your doctor about ways to keep your bones healthy.  It may not be safe to breastfeed while using this medicine. Ask your doctor about any risk.  Pioglitazone is not approved for use by anyone younger than  65 years old.  How should I take pioglitazone?  Follow all directions on your prescription label and read all medication guides or instruction sheets. Your doctor may occasionally change your dose. Use the medicine exactly as directed.  Pioglitazone is usually taken once daily, with or without food.  You may have low blood sugar (hypoglycemia) and feel very hungry, dizzy, irritable, confused, anxious, or shaky. To quickly treat hypoglycemia, eat or drink a fast-acting source of sugar (fruit juice, hard candy, crackers, raisins, or non-diet soda).  Your doctor may prescribe a glucagon injection kit in case you have severe hypoglycemia. Be sure your family or close friends know how to give you this injection in an emergency.  Blood sugar levels can be affected  by stress, illness, surgery, exercise, alcohol use, or skipping meals. Ask your doctor before changing your dose or medication schedule.  Pioglitazone is only part of a treatment program that may also include diet, exercise, weight control, blood sugar testing, and special medical care. Follow your doctor's instructions very closely.  Store at room temperature away from moisture, heat, and light. Keep the bottle tightly closed when not in use.  What happens if I miss a dose?  Take the medicine as soon as you can, but skip the missed dose if it is almost time for your next dose. Do not take two doses at one time.  What happens if I overdose?  Seek emergency medical attention or call the Poison Help line at 1-254 830 4084. You may have signs of low blood sugar, such as extreme weakness, blurred vision, sweating, trouble speaking, tremors, stomach pain, confusion, and seizure (convulsions).  What should I avoid while taking pioglitazone?  Avoid drinking alcohol. It lowers blood sugar and may interfere with your diabetes treatment.  What are the possible side effects of pioglitazone?  Get emergency medical help if you have signs of an allergic reaction: hives;  difficult breathing; swelling of your face, lips, tongue, or throat.  Stop using pioglitazone and call your doctor at once if you have symptoms of liver damage: nausea, upper stomach pain, itching, loss of appetite, dark urine, clay-colored stools, or jaundice (yellowing of the skin or eyes).  Call your doctor at once if you have:  ?? shortness of breath (especially when lying down), unusual tiredness, swelling, rapid weight gain;  ?? pink or red urine, painful or difficult urination, new or worsening urge to urinate;  ?? changes in your vision; or  ?? sudden unusual pain in your hand, arm, or foot.  Some people taking this medicine have had bladder cancer, but it is not clear if pioglitazone was the actual cause.  Common side effects may include:  ?? headache;  ?? muscle pain; or  ?? cold symptoms such as stuffy nose, sinus pain, sneezing, sore throat.  This is not a complete list of side effects and others may occur. Call your doctor for medical advice about side effects. You may report side effects to FDA at 1-800-FDA-1088.  What other drugs will affect pioglitazone?  Tell your doctor if you use insulin. Taking pioglitazone while you are using insulin may increase your risk of serious heart problems.  Many drugs can affect pioglitazone. This includes prescription and over-the-counter medicines, vitamins, and herbal products. Not all possible interactions are listed here. Tell your doctor about all your current medicines and any medicine you start or stop using.  Where can I get more information?  Your pharmacist can provide more information about pioglitazone.  Remember, keep this and all other medicines out of the reach of children, never share your medicines with others, and use this medication only for the indication prescribed.   Every effort has been made to ensure that the information provided by [facility] is accurate, up-to-date, and complete, but no guarantee is made to that effect. Drug  information contained herein may be time sensitive. [employer] information has been compiled for use by healthcare practitioners and consumers in the [place] and therefore [employer] does not warrant that uses outside of the [place] are appropriate, unless specifically indicated otherwise. [employer]'s drug information does not endorse drugs, diagnose patients or recommend therapy. [employer]'s drug information is an Hotel manager designed to assist licensed healthcare practitioners in caring  for their patients and/or to serve consumers viewing this service as a supplement to, and not a substitute for, the expertise, skill, knowledge and judgment of healthcare practitioners. The absence of a warning for a given drug or drug combination in no way should be construed to indicate that the drug or drug combination is safe, effective or appropriate for any given patient. [employer] does not assume any responsibility for any aspect of healthcare administered with the aid of information [employer] provides. The information contained herein is not intended to cover all possible uses, directions, precautions, warnings, drug interactions, allergic reactions, or adverse effects. If you have questions about the drugs you are taking, check with your doctor, nurse or pharmacist.  Copyright [phone] [place]: 14.02. Revision date: 06/14/2018.  Care instructions adapted under license by [facility]. If you have questions about a medical condition or this instruction, always ask your healthcare professional. [place] any warranty or liability for your use of this information.

## 2019-10-03 ENCOUNTER — Encounter

## 2019-10-03 MED ORDER — HYDROCORTISONE 2.5 % EX CREA
2.5 % | CUTANEOUS | 3 refills | Status: AC
Start: 2019-10-03 — End: 2021-11-17

## 2019-10-03 MED ORDER — FLUTICASONE PROPIONATE 50 MCG/ACT NA SUSP
50 MCG/ACT | NASAL | 3 refills | Status: DC
Start: 2019-10-03 — End: 2020-06-23

## 2019-10-03 MED ORDER — OLOPATADINE HCL 0.1 % OP SOLN
0.1 % | Freq: Two times a day (BID) | OPHTHALMIC | 3 refills | Status: AC
Start: 2019-10-03 — End: 2022-02-02

## 2019-10-17 ENCOUNTER — Encounter

## 2019-10-17 MED ORDER — FENOFIBRATE 145 MG PO TABS
145 MG | ORAL_TABLET | ORAL | 3 refills | Status: DC
Start: 2019-10-17 — End: 2020-07-30

## 2019-10-17 NOTE — Telephone Encounter (Signed)
Recent Visits  Date Type Provider Dept   10/02/19 Office Visit Jone Baseman, APRN - CNP [facility] [place] [facility]   08/13/19 Office Visit [name], DO [facility] [place] [facility]   07/02/19 Office Visit Jone Baseman, APRN - CNP [facility] [place] [facility]   04/02/19 Office Visit Jone Baseman, APRN - CNP [facility] [place] [facility]   03/05/19 Office Visit Jone Baseman, APRN - CNP [facility] [place] [facility]   12/04/18 Office Visit [name], DO [facility] [place] [facility]   09/03/18 Office Visit [name], DO [facility] [place] [facility]   Showing recent visits within past 540 days with a meds authorizing provider and meeting all other requirements  Future Appointments  Date Type Provider Dept   12/10/19 Appointment [name], DO [facility] [place] [facility]   Showing future appointments within next 150 days with a meds authorizing provider and meeting all other requirements    Future Appointments   Date Time Provider Department Center   11/06/2019 10:45 AM Zoheir Derl Barrow, MD [facility] [place]   12/10/2019 12:40 PM [name], DO [place] [facility] [place]

## 2019-10-30 ENCOUNTER — Encounter

## 2019-10-30 MED ORDER — OXYCODONE-ACETAMINOPHEN 10-325 MG PO TABS
10-325 MG | ORAL_TABLET | Freq: Three times a day (TID) | ORAL | 0 refills | Status: DC | PRN
Start: 2019-10-30 — End: 2019-11-28

## 2019-10-30 MED ORDER — DIAZEPAM 5 MG PO TABS
5 MG | ORAL_TABLET | Freq: Three times a day (TID) | ORAL | 0 refills | Status: DC | PRN
Start: 2019-10-30 — End: 2019-11-28

## 2019-10-30 NOTE — Telephone Encounter (Signed)
Pt called in and is wondering if he should get the booster shot or not.     Please advise.

## 2019-10-31 NOTE — Telephone Encounter (Signed)
Patient stopped in to the office   Patient has been informed and voiced understanding

## 2019-10-31 NOTE — Telephone Encounter (Signed)
Yes, he would be a candidate to get a 3rd pfizer shot

## 2019-11-05 MED ORDER — METOPROLOL SUCCINATE ER 25 MG PO TB24
25 MG | ORAL_TABLET | ORAL | 3 refills | Status: DC
Start: 2019-11-05 — End: 2020-11-20

## 2019-11-06 ENCOUNTER — Ambulatory Visit
Admit: 2019-11-06 | Discharge: 2019-11-06 | Payer: MEDICARE | Attending: Cardiovascular Disease | Primary: Family Medicine

## 2019-11-06 ENCOUNTER — Encounter: Attending: Cardiovascular Disease | Primary: Family Medicine

## 2019-11-06 ENCOUNTER — Ambulatory Visit: Admit: 2019-11-06 | Discharge: 2019-11-06 | Payer: MEDICARE | Primary: Family Medicine

## 2019-11-06 DIAGNOSIS — I25810 Atherosclerosis of coronary artery bypass graft(s) without angina pectoris: Secondary | ICD-10-CM

## 2019-11-06 NOTE — Progress Notes (Signed)
[facility]  [place] [address]  Dept: [phone]  Dept Fax: [phone]  Loc: [phone]    Visit Date: 11/06/2019    Micheal [name] is a 64 y.o. male who presents todayfor:  Chief Complaint   Patient presents with   ??? 1 Year Follow Up   ??? Hypertension   ??? Coronary Artery Disease   ??? Hyperlipidemia     Known CABg and stents  As well   COPD   Baseline dyspnea  No changes in breathing  Used to smoke  Some weight issues  No new symptoms  chronic angina  No use of nitro   Bp is stable  No dizziness  No syncope      HPI:  HPI  Past Medical History:   Diagnosis Date   ??? Arthritis     general   ??? CAD (coronary artery disease)     s/p stent placement-sees Dr. Irene Limbo   ??? Chronic back pain    ??? Diabetes mellitus ([place])    ??? GERD (gastroesophageal reflux disease)    ??? Hyperlipidemia    ??? Hypertension       Past Surgical History:   Procedure Laterality Date   ??? COLONOSCOPY  2015    [name], [place]   ??? COLONOSCOPY Left 12/19/2017    COLONOSCOPY POLYPECTOMY HOT BIOPSY performed by Earmon Phoenix, MD at [facility]   ??? CORONARY ARTERY BYPASS GRAFT  2009    6 vessel-[place]   ??? DIAGNOSTIC CARDIAC CATH LAB PROCEDURE     ??? ORTHOPEDIC SURGERY Left    ??? PTCA  07/04/2016   ??? TONSILLECTOMY       Family History   Problem Relation Age of Onset   ??? Cancer Mother    ??? Heart Disease Brother    ??? Stroke Brother    ??? Diabetes Brother    ??? Early Death Brother    ??? High Blood Pressure Brother    ??? High Cholesterol Brother    ??? Mental Retardation Sister      Social History     Tobacco Use   ??? Smoking status: Former Smoker     Packs/day: 1.00     Years: 15.00     Pack years: 15.00     Types: Cigarettes     Quit date: 05/24/2008     Years since quitting: 11.4   ??? Smokeless tobacco: Never Used   Substance Use Topics   ??? Alcohol use: Yes     Alcohol/week: 1.0 standard drinks     Types: 1 Glasses of wine per week     Comment: once monthly  stopped a over 2 years      Current Outpatient Medications   Medication Sig Dispense Refill   ??? metoprolol succinate (TOPROL XL) 25 MG extended release tablet Take 1 tablet by mouth once daily 90 tablet 3   ??? oxyCODONE-acetaminophen (PERCOCET) 10-325 MG per tablet Take 1 tablet by mouth every 8 hours as needed for Pain for up to 30 days. 90 tablet 0   ??? diazePAM (VALIUM) 5 MG tablet Take 1 tablet by mouth every 8 hours as needed for Anxiety for up to 30 days. 90 tablet 0   ??? fenofibrate (TRICOR) 145 MG tablet Take 1 tablet by mouth once daily 90 tablet 3   ??? fluticasone (FLONASE) 50 MCG/ACT nasal spray Use 2 spray(s)  in each nostril once daily 16 g 3   ??? olopatadine (PATANOL) 0.1 % ophthalmic solution Place 1 drop into both eyes 2 times daily 3 each 3   ??? hydrocortisone 2.5 % cream Apply to external ear BID PRN 3 each 3   ??? NARCAN 4 MG/0.1ML LIQD nasal spray ADMINISTER A SINGLE SPRAY INTRANASALLY INTO ONE NOSTRIL. CALL 911. MAY REPEAT X 1.     ??? pioglitazone (ACTOS) 15 MG tablet Take 1 tablet by mouth daily 30 tablet 3   ??? esomeprazole (NEXIUM) 40 MG delayed release capsule Take 1 capsule by mouth every morning (before breakfast) 90 capsule 3   ??? glimepiride (AMARYL) 4 MG tablet TAKE 1 TABLET BID 180 tablet 3   ??? pregabalin (LYRICA) 50 MG capsule TAKE 1 CAPSULE BY MOUTH THREE TIMES DAILY 90 capsule 5   ??? lisinopril (PRINIVIL;ZESTRIL) 40 MG tablet Take 1 tablet by mouth once daily 90 tablet 3   ??? albuterol sulfate HFA 108 (90 Base) MCG/ACT inhaler Inhale 2 puffs into the lungs every 6 hours as needed for Wheezing or Shortness of Breath 1 Inhaler 2   ??? montelukast (SINGULAIR) 10 MG tablet Take 1 tablet by mouth nightly 90 tablet 3   ??? isosorbide mononitrate (IMDUR) 30 MG extended release tablet Take 1 tablet by mouth daily 90 tablet 2   ??? tamsulosin (FLOMAX) 0.4 MG capsule Take 1 capsule by mouth 2 times daily 180 capsule 3   ??? blood glucose monitor kit and supplies Dispense sufficient amount for indicated testing  frequency plus additional to accommodate PRN testing needs. Dispense all needed supplies to include: monitor, strips, lancing device, lancets, control solutions, alcohol swabs. 1 kit 0   ??? metFORMIN (GLUCOPHAGE) 500 MG tablet Take 1 tablet by mouth 2 times daily (with meals) 180 tablet 3   ??? nitroGLYCERIN (NITROSTAT) 0.4 MG SL tablet Place 1 tablet under the tongue every 5 minutes as needed for Chest pain 25 tablet 2   ??? ticagrelor (BRILINTA) 90 MG TABS tablet TAKE 1 TABLET BY MOUTH TWICE DAILY 180 tablet 3   ??? atorvastatin (LIPITOR) 80 MG tablet Take 1 tablet by mouth nightly 90 tablet 3   ??? PARoxetine (PAXIL) 40 MG tablet Take 1 tablet by mouth once daily 90 tablet 3   ??? Handicap Placard MISC by Does not apply route Expires 03/01/2020 1 each 0   ??? Multiple Vitamins-Minerals (CENTRUM SILVER ULTRA MENS) TABS Take 1 tablet by mouth daily       No current facility-administered medications for this visit.     Allergies   Allergen Reactions   ??? Gabapentin      Health Maintenance   Topic Date Due   ??? AAA screen  Never done   ??? Diabetic retinal exam  Never done   ??? Pneumococcal 65+ years Vaccine (2 of 2 - PPSV23) 06/12/2019   ??? Flu vaccine (1) 09/25/2019   ??? Annual Wellness Visit (AWV)  12/05/2019   ??? DTaP/Tdap/Td vaccine (1 - Tdap) 03/04/2020 (Originally 06/11/1974)   ??? Diabetic foot exam  12/04/2019   ??? Lipid screen  12/17/2019   ??? Potassium monitoring  12/17/2019   ??? Creatinine monitoring  12/17/2019   ??? A1C test (Diabetic or Prediabetic)  01/01/2020   ??? Diabetic microalbuminuria test  03/04/2020   ??? Colon cancer screen colonoscopy  12/20/2022   ??? Shingles Vaccine  Completed   ??? COVID-19 Vaccine  Completed   ??? Hepatitis C screen  Completed   ??? HIV  screen  Completed   ??? Hepatitis A vaccine  Aged Out   ??? Hib vaccine  Aged Out   ??? Meningococcal (ACWY) vaccine  Aged Out       Subjective:  Review of Systems  General:   No fever, no chills, No fatigue or weight loss  Pulmonary:    No dyspnea, no wheezing  Cardiac:    Denies  recent chest pain,   GI:     No nausea or vomiting, no abdominal pain  Neuro:    No dizziness or light headedness,   Musculoskeletal:  No recent active issues  Extremities:   No edema, no obvious claudication       Objective:  Physical Exam  BP 118/78    Pulse 80    Ht _0  (1.702 m)    Wt 227 lb 9.6 oz (103.2 kg)    BMI 35.65 kg/m??   General:   Well developed, well nourished  Lungs:   Clear to auscultation  Heart:    Normal S1 S2, Slight murmur. no rubs, no gallops  Abdomen:   Soft, non tender, no organomegalies, positive bowel sounds  Extremities:   No edema, no cyanosis, good peripheral pulses  Neurological:   Awake, alert, oriented. No obvious focal deficits  Musculoskelatal:  No obvious deformities    Assessment:      Diagnosis Orders   1. Coronary artery disease involving coronary bypass graft of native heart without angina pectoris  EKG 12 lead   2. Essential hypertension  EKG 12 lead   3. Familial hypercholesterolemia     as above  Cardiac fair for now   ECG in office was done today. I reviewed the ECG. No acute findings  '    Plan:  No follow-ups on file.  As above  Continue risk factor modification and medical management  Thank you for allowing me to participate in the care of your patient. Please don't hesitate to contact me regarding any further issues related to the patient care    Orders Placed:  Orders Placed This Encounter   Procedures   ??? EKG 12 lead     Order Specific Question:   Reason for Exam?     Answer:   Other       Medications Prescribed:  No orders of the defined types were placed in this encounter.         Discussed use, benefit, and side effects of prescribed medications. All patient questions answered. Pt voicedunderstanding. Instructed to continue current medications, diet and exercise. Continue risk factor modification and medical management. Patient agreed with treatment plan. Follow up as directed.    Electronically signedby Veverly Fells, MD on 11/06/2019 at 10:43 AM

## 2019-11-07 ENCOUNTER — Encounter: Attending: Cardiovascular Disease | Primary: Family Medicine

## 2019-11-26 ENCOUNTER — Ambulatory Visit: Admit: 2019-11-26 | Discharge: 2019-11-26 | Payer: MEDICARE | Attending: Family | Primary: Family Medicine

## 2019-11-26 DIAGNOSIS — H6690 Otitis media, unspecified, unspecified ear: Secondary | ICD-10-CM

## 2019-11-26 MED ORDER — AMOXICILLIN-POT CLAVULANATE 875-125 MG PO TABS
875-125 MG | ORAL_TABLET | Freq: Two times a day (BID) | ORAL | 0 refills | Status: AC
Start: 2019-11-26 — End: 2019-12-03

## 2019-11-26 MED ORDER — PREDNISONE 20 MG PO TABS
20 MG | ORAL_TABLET | Freq: Every day | ORAL | 0 refills | Status: AC
Start: 2019-11-26 — End: 2019-12-01

## 2019-11-26 NOTE — Progress Notes (Signed)
SUBJECTIVE:  Micheal [name] is a 64 y.o. y/o male that presents with Pharyngitis (no fever) and Otalgia (left)  .    HPI:      Symptoms have been present for 1 week(s).  Symptoms are unchanged since they initially started.    Fever? No  Runny nose or congestion?  Yes   Cough?  Yes  Sore throat?  Yes  Shortness of breath/Wheezing?  Yes - wheezing mainly, at times SOB  Other associated symptoms?  ear pain      Known COVID exposures or RFs?  No  Smoker?  No  Preexisting Respiratory conditions?  asthma      Past Medical History:   Diagnosis Date   ??? Arthritis     general   ??? CAD (coronary artery disease)     s/p stent placement-sees Dr. Gabrielle Dare   ??? Chronic back pain    ??? Diabetes mellitus ([facility])    ??? GERD (gastroesophageal reflux disease)    ??? Hyperlipidemia    ??? Hypertension            Tobacco Use      Smoking status: Former Smoker        Packs/day: 1.00        Years: 15.00        Pack years: 15        Types: Cigarettes        Quit date: 05/24/2008        Years since quitting: 11.5            OBJECTIVE:  BP 128/84    Pulse 88    Temp 97.6 ??F (36.4 ??C) (Infrared)    Resp 14    Ht 5\' 7"  (1.702 m)    Wt 233 lb 6.4 oz (105.9 kg)    SpO2 99%    BMI 36.56 kg/m??   General appearance: alert, well appearing, and in no distress.  ENT exam reveals - bilateral TM red, dull, bulging, neck without nodes, pharynx erythematous without exudate and nasal mucosa congested.   CVS exam: normal rate, regular rhythm, normal S1, S2, no murmurs, rubs, clicks or gallops.  Chest:clear to auscultation, no wheezes, rales or rhonchi, symmetric air entry.   Abdominal exam: soft, nontender, nondistended, no masses or organomegaly.  Extremities:  No clubbing, cyanosis or edema  Skin exam - normal coloration and turgor, no rashes, no suspicious skin lesions noted.  Psych -  Affect appropriate.  Thought process is normal without evidence of depression or psychosis.    Good insight and appropriae interaction.  Cognition and memory appear to be  intact.        ASSESSMENT & PLAN  [name] was seen today for pharyngitis and otalgia.    Diagnoses and all orders for this visit:    Acute otitis media, unspecified otitis media type  -     predniSONE (DELTASONE) 20 MG tablet; Take 2 tablets by mouth daily for 5 days  -     amoxicillin-clavulanate (AUGMENTIN) 875-125 MG per tablet; Take 1 tablet by mouth 2 times daily for 7 days    Acute sinusitis, recurrence not specified, unspecified location  -     predniSONE (DELTASONE) 20 MG tablet; Take 2 tablets by mouth daily for 5 days  -     amoxicillin-clavulanate (AUGMENTIN) 875-125 MG per tablet; Take 1 tablet by mouth 2 times daily for 7 days        No follow-ups on file.     -Start  above treatments  -Patient advised to contact our office immediately if symptoms worsen or persist  -Patient counseled on conservative care including fluids, rest and OTC meds      I have reviewed this patient's history, habits, and medication list and have updated the chart where appropriate.    Electronically signed by Jone Baseman, APRN - CNP on 11/26/2019 at 2:16 PM

## 2019-11-26 NOTE — Patient Instructions (Signed)
Patient Education        Ear Infection (Otitis Media): Care Instructions  Overview     An ear infection may start with a cold and affect the middle ear (otitis media). It can hurt a lot. Most ear infections clear up on their own in a couple of days and do not need antibiotics. Also, antibiotics do not work against viruses, which may be the cause of your infection. Regular doses of pain relievers are the best way to reduce your fever and help you feel better.  Follow-up care is a key part of your treatment and safety. Be sure to make and go to all appointments, and call your doctor if you are having problems. It's also a good idea to know your test results and keep a list of the medicines you take.  How can you care for yourself at home?  ?? Take pain medicines exactly as directed.  ? If the doctor gave you a prescription medicine for pain, take it as prescribed.  ? If you are not taking a prescription pain medicine, take an over-the-counter medicine, such as acetaminophen (Tylenol), ibuprofen (Advil, Motrin), or naproxen (Aleve). Read and follow all instructions on the label.  ? Do not take two or more pain medicines at the same time unless the doctor told you to. Many pain medicines have acetaminophen, which is Tylenol. Too much acetaminophen (Tylenol) can be harmful.  ?? Plan to take a full dose of pain reliever before bedtime. Getting enough sleep will help you get better.  ?? Try a warm, moist washcloth on the ear. It may help relieve pain.  ?? If your doctor prescribed antibiotics, take them as directed. Do not stop taking them just because you feel better. You need to take the full course of antibiotics.  When should you call for help?   Call your doctor now or seek immediate medical care if:  ?? ?? You have new or increasing ear pain.   ?? ?? You have new or increasing pus or blood draining from your ear.   ?? ?? You have a fever with a stiff neck or a severe headache.   Watch closely for changes in your health, and  be sure to contact your doctor if:  ?? ?? You have new or worse symptoms.   ?? ?? You are not getting better after taking an antibiotic for 2 days.   Where can you learn more?  Go to [email or web address] and sign in to your MyChart account. Enter X558 in the Search Health Information box to learn more about "Ear Infection (Otitis Media): Care Instructions."     If you do not have an account, please click on the "Sign Up Now" link.  Current as of: December 26, 2018??????????????????????????????Content Version: 13.0  ?? 2006-2021 [employer]wise, Inc[employer].   Care instructions adapted under license by [facility]. If you have questions about a medical condition or this instruction, always ask your healthcare professional. Healthwise, Incorporated disclaims any warranty or liability for your use of this information.         Patient Education        Sinusitis: Care Instructions  Your Care Instructions     Sinusitis is an infection of the lining of the sinus cavities in your head. Sinusitis often follows a cold. It causes pain and pressure in your head and face.  In most cases, sinusitis gets better on its own in 1 to 2 weeks. But some mild symptoms may last for several   weeks. Sometimes antibiotics are needed.  Follow-up care is a key part of your treatment and safety. Be sure to make and go to all appointments, and call your doctor if you are having problems. It's also a good idea to know your test results and keep a list of the medicines you take.  How can you care for yourself at home?  ?? Take an over-the-counter pain medicine, such as acetaminophen (Tylenol), ibuprofen (Advil, Motrin), or naproxen (Aleve). Read and follow all instructions on the label.  ?? If the doctor prescribed antibiotics, take them as directed. Do not stop taking them just because you feel better. You need to take the full course of antibiotics.  ?? Be careful when taking over-the-counter cold or flu medicines and Tylenol at the same time. Many of  these medicines have acetaminophen, which is Tylenol. Read the labels to make sure that you are not taking more than the recommended dose. Too much acetaminophen (Tylenol) can be harmful.  ?? Breathe warm, moist air from a steamy shower, a hot bath, or a sink filled with hot water. Avoid cold, dry air. Using a humidifier in your home may help. Follow the directions for cleaning the machine.  ?? Use saline (saltwater) nasal washes. This can help keep your nasal passages open and wash out mucus and bacteria. You can buy saline nose drops at a grocery store or drugstore. Or you can make your own at home by adding 1 teaspoon of salt and 1 teaspoon of baking soda to 2 cups of distilled water. If you make your own, fill a bulb syringe with the solution, insert the tip into your nostril, and squeeze gently. Blow your nose.  ?? Put a hot, wet towel or a warm gel pack on your face 3 or 4 times a day for 5 to 10 minutes each time.  ?? Try a decongestant nasal spray like oxymetazoline (Afrin). Do not use it for more than 3 days in a row. Using it for more than 3 days can make your congestion worse.  When should you call for help?   Call your doctor now or seek immediate medical care if:  ?? ?? You have new or worse swelling or redness in your face or around your eyes.   ?? ?? You have a new or higher fever.   Watch closely for changes in your health, and be sure to contact your doctor if:  ?? ?? You have new or worse facial pain.   ?? ?? The mucus from your nose becomes thicker (like pus) or has new blood in it.   ?? ?? You are not getting better as expected.   Where can you learn more?  Go to [email or web address] and sign in to your MyChart account. Enter I933 in the Search Health Information box to learn more about "Sinusitis: Care Instructions."     If you do not have an account, please click on the "Sign Up Now" link.  Current as of: December 26, 2018??????????????????????????????Content Version: 13.0  ?? 2006-2021 Healthwise,  Incorporated.   Care instructions adapted under license by [facility]. If you have questions about a medical condition or this instruction, always ask your healthcare professional. Health[employer], Inc[employer] disclaims any warranty or liability for your use of this information.

## 2019-11-28 ENCOUNTER — Encounter

## 2019-11-28 MED ORDER — OXYCODONE-ACETAMINOPHEN 10-325 MG PO TABS
10-325 MG | ORAL_TABLET | Freq: Three times a day (TID) | ORAL | 0 refills | Status: DC | PRN
Start: 2019-11-28 — End: 2019-12-26

## 2019-11-28 MED ORDER — DIAZEPAM 5 MG PO TABS
5 MG | ORAL_TABLET | Freq: Three times a day (TID) | ORAL | 0 refills | Status: DC | PRN
Start: 2019-11-28 — End: 2019-12-26

## 2019-12-10 ENCOUNTER — Ambulatory Visit: Admit: 2019-12-10 | Discharge: 2019-12-10 | Payer: MEDICARE | Attending: Family Medicine | Primary: Family Medicine

## 2019-12-10 DIAGNOSIS — Z0000 Encounter for general adult medical examination without abnormal findings: Secondary | ICD-10-CM

## 2019-12-10 LAB — POCT GLYCOSYLATED HEMOGLOBIN (HGB A1C): Hemoglobin A1C: 9.1 % — ABNORMAL HIGH (ref 4.3–5.7)

## 2019-12-10 MED ORDER — TICAGRELOR 90 MG PO TABS
90 MG | ORAL_TABLET | ORAL | 3 refills | Status: DC
Start: 2019-12-10 — End: 2020-12-11

## 2019-12-10 MED ORDER — METFORMIN HCL 1000 MG PO TABS
1000 MG | ORAL_TABLET | Freq: Two times a day (BID) | ORAL | 3 refills | Status: DC
Start: 2019-12-10 — End: 2020-12-25

## 2019-12-10 NOTE — Progress Notes (Signed)
Medicare Annual Wellness Visit  Name: Micheal [name] Today???s Date: 12/10/2019   MRN: [id number] Sex: Male   Age: 64 y.o. Ethnicity: Hispanic / Latino   DOB: 05/24/1955 Race: Hispanic / Latino      Micheal [name] is here for Medicare AWV    Screenings for behavioral, psychosocial and functional/safety risks, and cognitive dysfunction are all negative except as indicated below. These results, as well as other patient data from the [place] form, are documented in Flowsheets linked to this Encounter.    Allergies   Allergen Reactions   ??? Gabapentin        Prior to Visit Medications    Medication Sig Taking? Authorizing Provider   oxyCODONE-acetaminophen (PERCOCET) 10-325 MG per tablet Take 1 tablet by mouth every 8 hours as needed for Pain for up to 30 days.  Aadin Gaut D Maimouna Rondeau, DO   diazePAM (VALIUM) 5 MG tablet Take 1 tablet by mouth every 8 hours as needed for Anxiety for up to 30 days.  Kyra Leyland, DO   metoprolol succinate (TOPROL XL) 25 MG extended release tablet Take 1 tablet by mouth once daily  Berdena Cisek D Kathi Dohn, DO   fenofibrate (TRICOR) 145 MG tablet Take 1 tablet by mouth once daily  Zeyad Delaguila D Micheal Kruschke, DO   fluticasone (FLONASE) 50 MCG/ACT nasal spray Use 2 spray(s) in each nostril once daily  Misha Antonini D Davyn Elsasser, DO   olopatadine (PATANOL) 0.1 % ophthalmic solution Place 1 drop into both eyes 2 times daily  Kyra Leyland, DO   hydrocortisone 2.5 % cream Apply to external ear BID PRN  Ladell Bey D Jonathon Castelo, DO   NARCAN 4 MG/0.1ML LIQD nasal spray ADMINISTER A SINGLE SPRAY INTRANASALLY INTO ONE NOSTRIL. CALL 911. MAY REPEAT X 1.  Historical Provider, MD   pioglitazone (ACTOS) 15 MG tablet Take 1 tablet by mouth daily  Antony Contras, APRN - CNP   esomeprazole (NEXIUM) 40 MG delayed release capsule Take 1 capsule by mouth every morning (before breakfast)  Celine Ahr Cardale Dorer, DO   glimepiride (AMARYL) 4 MG tablet TAKE 1 TABLET BID  Chief Walkup D Jaden Batchelder, DO   pregabalin (LYRICA) 50 MG capsule TAKE 1 CAPSULE BY MOUTH THREE TIMES DAILY  Blayde Bacigalupi  D Floyd Wade, DO   lisinopril (PRINIVIL;ZESTRIL) 40 MG tablet Take 1 tablet by mouth once daily  Janine Reller D Arnika Larzelere, DO   albuterol sulfate HFA 108 (90 Base) MCG/ACT inhaler Inhale 2 puffs into the lungs every 6 hours as needed for Wheezing or Shortness of Breath  Antony Contras, APRN - CNP   montelukast (SINGULAIR) 10 MG tablet Take 1 tablet by mouth nightly  Antony Contras, APRN - CNP   isosorbide mononitrate (IMDUR) 30 MG extended release tablet Take 1 tablet by mouth daily  Zoheir A Charlyn Minerva, MD   tamsulosin (FLOMAX) 0.4 MG capsule Take 1 capsule by mouth 2 times daily  Antony Contras, APRN - CNP   blood glucose monitor kit and supplies Dispense sufficient amount for indicated testing frequency plus additional to accommodate PRN testing needs. Dispense all needed supplies to include: monitor, strips, lancing device, lancets, control solutions, alcohol swabs.  Antony Contras, APRN - CNP   metFORMIN (GLUCOPHAGE) 500 MG tablet Take 1 tablet by mouth 2 times daily (with meals)  Antony Contras, APRN - CNP   nitroGLYCERIN (NITROSTAT) 0.4 MG SL tablet Place 1 tablet under the tongue every 5 minutes as needed for Chest pain  Micheal Fells, MD  ticagrelor (BRILINTA) 90 MG TABS tablet TAKE 1 TABLET BY MOUTH TWICE DAILY  Zoheir A Abdelbaki, MD   atorvastatin (LIPITOR) 80 MG tablet Take 1 tablet by mouth nightly  Zoheir A Charlyn Minerva, MD   PARoxetine (PAXIL) 40 MG tablet Take 1 tablet by mouth once daily  Kyra Leyland, DO   Handicap Placard MISC by Does not apply route Expires 03/01/2020  Kyra Leyland, DO   Multiple Vitamins-Minerals (CENTRUM SILVER ULTRA MENS) TABS Take 1 tablet by mouth daily  Historical Provider, MD       Past Medical History:   Diagnosis Date   ??? Arthritis     general   ??? CAD (coronary artery disease)     s/p stent placement-sees Dr. Irene Limbo   ??? Chronic back pain    ??? Diabetes mellitus ([place])    ??? GERD (gastroesophageal reflux disease)    ??? Hyperlipidemia    ??? Hypertension        Past Surgical  History:   Procedure Laterality Date   ??? COLONOSCOPY  2015    [place], [place]   ??? COLONOSCOPY Left 12/19/2017    COLONOSCOPY POLYPECTOMY HOT BIOPSY performed by Earmon Phoenix, MD at [facility]   ??? CORONARY ARTERY BYPASS GRAFT  2009    6 vessel-[place]   ??? DIAGNOSTIC CARDIAC CATH LAB PROCEDURE     ??? ORTHOPEDIC SURGERY Left    ??? PTCA  07/04/2016   ??? TONSILLECTOMY         Family History   Problem Relation Age of Onset   ??? Cancer Mother    ??? Heart Disease Brother    ??? Stroke Brother    ??? Diabetes Brother    ??? Early Death Brother    ??? High Blood Pressure Brother    ??? High Cholesterol Brother    ??? Mental Retardation Sister        CareTeam (Including outside providers/suppliers regularly involved in providing care):   Patient Care Team:  Kyra Leyland, DO as PCP - General (Family Medicine)  Kyra Leyland, DO as PCP - [facility] Empaneled Provider    Wt Readings from Last 3 Encounters:   12/10/19 230 lb 3.2 oz (104.4 kg)   11/26/19 233 lb 6.4 oz (105.9 kg)   11/06/19 227 lb 9.6 oz (103.2 kg)     Vitals:    12/10/19 1238   BP: 115/74   Pulse: 89   Resp: 16   Temp: 96.8 ??F (36 ??C)   TempSrc: Infrared   SpO2: 96%   Weight: 230 lb 3.2 oz (104.4 kg)   Height: '5\' 7"'  (1.702 m)     Body mass index is 36.05 kg/m??.    Based upon direct observation of the patient, evaluation of cognition reveals recent and remote memory intact.    Patient's complete Health Risk Assessment and screening values have been reviewed and are found in Flowsheets. The following problems were reviewed today and where indicated follow up appointments were made and/or referrals ordered.    Positive Risk Factor Screenings with Interventions:          General Health and ACP:  General  In general, how would you say your health is?: Good  In the past 7 days, have you experienced any of the following? New or Increased Pain, New or Increased Fatigue, Loneliness, Social Isolation, Stress or Anger?: (!) New or Increased Pain  Do you get the social and  emotional support that you need?: Yes  Do you have a Living Will?: (!) No  Advance Directives     Power of [place] Will ACP-Advance Directive ACP-Power of Attorney    Not on File Not on File Not on File Not on File      General Health Risk Interventions:  ?? No Living Will: Advance Care Planning addressed with patient today    Health Habits/Nutrition:  Health Habits/Nutrition  Do you exercise for at least 20 minutes 2-3 times per week?: (!) No  Have you lost any weight without trying in the past 3 months?: No  Do you eat only one meal per day?: No  Have you seen the dentist within the past year?: (!) No  Body mass index: (!) 36.05  Health Habits/Nutrition Interventions:  ?? Dental exam overdue:  patient encouraged to make appointment with his/her dentist    Hearing/Vision:  No exam data present  Hearing/Vision  Do you or your family notice any trouble with your hearing that hasn't been managed with hearing aids?: No  Do you have difficulty driving, watching TV, or doing any of your daily activities because of your eyesight?: No  Have you had an eye exam within the past year?: (!) No  Hearing/Vision Interventions:  ?? Vision concerns:  patient encouraged to make appointment with his/her eye specialist    Safety:  Safety  Do you have working smoke detectors?: Yes  Have all throw rugs been removed or fastened?: Yes  Do you have non-slip mats or surfaces in all bathtubs/showers?: (!) No  Do all of your stairways have a railing or banister?: Yes  Are your doorways, halls and stairs free of clutter?: Yes  Do you always fasten your seatbelt when you are in a car?: Yes  Safety Interventions:  ?? Home safety tips provided     Personalized Preventive Plan   Current Health Maintenance Status  Immunization History   Administered Date(s) Administered   ??? COVID-19, Pfizer, PF, 48mg/0.3mL 03/22/2019, 04/12/2019, 11/06/2019   ??? Influenza Vaccine, unspecified formulation 09/25/2014   ??? Influenza, Quadv, IM, PF (6 mo and older  Fluzone, Flulaval, Fluarix, and 3 yrs and older Afluria) 01/13/2016, 11/10/2016, 10/24/2017, 12/04/2018   ??? Pneumococcal Conjugate 13-valent (Prevnar13) 11/15/2017   ??? Pneumococcal Polysaccharide (Pneumovax23) 07/24/2013   ??? Zoster Recombinant (Shingrix) 11/15/2017, 09/03/2018        Health Maintenance   Topic Date Due   ??? AAA screen  Never done   ??? Diabetic retinal exam  Never done   ??? Pneumococcal 65+ years Vaccine (2 of 2 - PPSV23) 06/12/2019   ??? Flu vaccine (1) 09/25/2019   ??? Diabetic foot exam  12/04/2019   ??? Annual Wellness Visit (AWV)  12/05/2019   ??? Lipid screen  12/17/2019   ??? Potassium monitoring  12/17/2019   ??? Creatinine monitoring  12/17/2019   ??? DTaP/Tdap/Td vaccine (1 - Tdap) 03/04/2020 (Originally 06/11/1974)   ??? Diabetic microalbuminuria test  03/04/2020   ??? A1C test (Diabetic or Prediabetic)  03/11/2020   ??? Colon cancer screen colonoscopy  12/20/2022   ??? Shingles Vaccine  Completed   ??? COVID-19 Vaccine  Completed   ??? Hepatitis C screen  Completed   ??? HIV screen  Completed   ??? Hepatitis A vaccine  Aged Out   ??? Hib vaccine  Aged Out   ??? Meningococcal (ACWY) vaccine  Aged Out     Recommendations for Preventive Services Due: see orders and patient instructions/AVS.  .  Recommended screening schedule for the next  5-10 years is provided to the patient in written form: see Patient Instructions/AVS.    [place] was seen today for medicare awv.    Diagnoses and all orders for this visit:    Routine general medical examination at a health care facility    Other orders  -     POCT glycosylated hemoglobin (Hb A1C)

## 2019-12-10 NOTE — Progress Notes (Signed)
Immunization(s) given during visit:    Immunizations Administered     Name Date Dose Route    Influenza, Quadv, adjuvanted, 65 yrs +, IM, PF (Fluad) 12/10/2019 0.5 mL Intramuscular    Site: Deltoid- Right    Lot: [id number]    NDC: 54360-677-03          Most recent Vaccine Information Sheet dated 08/30/19 given to pt        Vaccine Information Sheet, "Influenza - Inactivated"  given to Micheal [name], or parent/legal guardian of  Micheal [name] and verbalized understanding.    Patient responses:    Have you ever had a reaction to a flu vaccine? No  Do you have an allergy to eggs, neomycin or polymixin?  No  Do you have an allergy to Thimerosal, contact lens solution, or Merthiolate? No  Have you ever had Guillian Barre Syndrome?  No  Do you have any current illness?  No  Do you have a temperature above 100 degrees? No  Are you pregnant? No  If pregnant, permission obtained from physician? No  Do you have an active neurological disorder? No      Flu vaccine given per order. Please see immunization tab.

## 2019-12-10 NOTE — Patient Instructions (Signed)
Personalized Preventive Plan for Micheal [name] - 12/10/2019  Medicare offers a range of preventive health benefits. Some of the tests and screenings are paid in full while other may be subject to a deductible, co-insurance, and/or copay.    Some of these benefits include a comprehensive review of your medical history including lifestyle, illnesses that may run in your family, and various assessments and screenings as appropriate.    After reviewing your medical record and screening and assessments performed today your provider may have ordered immunizations, labs, imaging, and/or referrals for you.  A list of these orders (if applicable) as well as your Preventive Care list are included within your After Visit Summary for your review.    Other Preventive Recommendations:    ?? A preventive eye exam performed by an eye specialist is recommended every 1-2 years to screen for glaucoma; cataracts, macular degeneration, and other eye disorders.  ?? A preventive dental visit is recommended every 6 months.  ?? Try to get at least 150 minutes of exercise per week or 10,000 steps per day on a pedometer .  ?? Order or download the FREE "Exercise & Physical Activity: Your Everyday Guide" from [employer]. Call [phone] or search [employer] online.  ?? You need 1200-1500 mg of calcium and 1000-2000 IU of vitamin D per day. It is possible to meet your calcium requirement with diet alone, but a vitamin D supplement is usually necessary to meet this goal.  ?? When exposed to the sun, use a sunscreen that protects against both UVA and UVB radiation with an SPF of 30 or greater. Reapply every 2 to 3 hours or after sweating, drying off with a towel, or swimming.  ?? Always wear a seat belt when traveling in a car. Always wear a helmet when riding a bicycle or motorcycle.

## 2019-12-10 NOTE — Progress Notes (Signed)
Micheal [name] is a 64 y.o. male that presents for     Chief Complaint   Patient presents with   ??? Medicare AWV       BP 115/74    Pulse 89    Temp 96.8 ??F (36 ??C) (Infrared)    Resp 16    Ht 5\' 7"  (1.702 m)    Wt 230 lb 3.2 oz (104.4 kg)    SpO2 96%    BMI 36.05 kg/m??       HPI:      Chronic Pain    HPI:    Patient has chronic pain of the low back.   Notes that meds help him to be more functional.  Pain control: adequate  Improvement in function and ADLs?  yes  Current Medications:  Percocet q 8 hrs  Escalation of dosage recently?  no    Evidence for abuse or diversion? no   Seen specialist or pain clinic?: yes      Controlled Substances Monitoring:        Diabetes Type 2    Glucose control:   Does patient check blood glucoses at home?  No, thinks that his a1c is elevated because he is staying at home , has not been working out due to fear of COVID.  Report of hypoglycemia: no  Lab Results   Component Value Date    LABA1C 9.1 (H) 12/10/2019     No results found for: EAG    Symptoms  Polyuria, Polydipsia or Polyphagia?   No  Chest Pain, SOB, or Palpitations? -  No  New Vision complaints? No  Paresthesias of the extremities?  No    Medications  Current medication were reviewed.  Compliant with medications?  yes  Medication side effects?  No  On ACE-I or ARB?  Yes  On antiplatelet therapy?  Yes  On Statin?  Yes      Exercise  Exercise? Not much recently, but feels like he can get more active.  Wt Readings from Last 3 Encounters:   12/10/19 230 lb 3.2 oz (104.4 kg)   11/26/19 233 lb 6.4 oz (105.9 kg)   11/06/19 227 lb 9.6 oz (103.2 kg)       Diet discipline?:  Low salt, fat, sugar diet?  Not watching closely, is trying to avoid white bread    Blood pressure control:  BP Readings from Last 3 Encounters:   12/10/19 115/74   11/26/19 128/84   11/06/19 118/78       Lab Results   Component Value Date    LABMICR 1.28 09/03/2014       Lab Results   Component Value Date    LDLCALC 92 12/17/2018     HTN    Does patient check BP  regularly at home? - No  Current Medication regimen - Lisinopril, Metoprolol, Imdur  Tolerating medications well? - yes    Shortness of breath or chest pain? No  Headache or visual complaints? No  Neurologic changes like confusion? No  Extremity edema? No    BP Readings from Last 3 Encounters:   12/10/19 115/74   11/26/19 128/84   11/06/19 118/78           Health Maintenance   Topic Date Due   ??? AAA screen  Never done   ??? Diabetic retinal exam  Never done   ??? Pneumococcal 65+ years Vaccine (2 of 2 - PPSV23) 06/12/2019   ??? Flu vaccine (1) 09/25/2019   ???  Diabetic foot exam  12/04/2019   ??? Annual Wellness Visit (AWV)  12/05/2019   ??? Lipid screen  12/17/2019   ??? Potassium monitoring  12/17/2019   ??? Creatinine monitoring  12/17/2019   ??? DTaP/Tdap/Td vaccine (1 - Tdap) 03/04/2020 (Originally 06/11/1974)   ??? Diabetic microalbuminuria test  03/04/2020   ??? A1C test (Diabetic or Prediabetic)  03/11/2020   ??? Colon cancer screen colonoscopy  12/20/2022   ??? Shingles Vaccine  Completed   ??? COVID-19 Vaccine  Completed   ??? Hepatitis C screen  Completed   ??? HIV screen  Completed   ??? Hepatitis A vaccine  Aged Out   ??? Hib vaccine  Aged Out   ??? Meningococcal (ACWY) vaccine  Aged Out       Past Medical History:   Diagnosis Date   ??? Arthritis     general   ??? CAD (coronary artery disease)     s/p stent placement-sees Dr. Gabrielle Dare   ??? Chronic back pain    ??? Diabetes mellitus (HCC)    ??? GERD (gastroesophageal reflux disease)    ??? Hyperlipidemia    ??? Hypertension        Past Surgical History:   Procedure Laterality Date   ??? [place]ON[place]COPY  2015    [place]   ??? COLONOSCOPY Left 12/19/2017    COLONOSCOPY POLYPECTOMY HOT BIOPSY performed by Florian Buff, MD at [facility]   ??? CORONARY ARTERY BYPASS GRAFT  2009    6 vessel-[place]   ??? DIAGNOSTIC CARDIAC CATH LAB PROCEDURE     ??? ORTHOPEDIC SURGERY Left    ??? PTCA  07/04/2016   ??? TONSILLECTOMY         Social History     Tobacco Use   ??? Smoking status: Former Smoker      Packs/day: 1.00     Years: 15.00     Pack years: 15.00     Types: Cigarettes     Quit date: 05/24/2008     Years since quitting: 11.5   ??? Smokeless tobacco: Never Used   Vaping Use   ??? Vaping Use: Never used   Substance Use Topics   ??? Alcohol use: Yes     Alcohol/week: 1.0 standard drink     Types: 1 Glasses of wine per week     Comment: once monthly stopped a over 2 years   ??? Drug use: No       Family History   Problem Relation Age of Onset   ??? Cancer Mother    ??? Heart Disease Brother    ??? Stroke Brother    ??? Diabetes Brother    ??? Early Death Brother    ??? High Blood Pressure Brother    ??? High Cholesterol Brother    ??? Mental Retardation Sister          I have reviewed the patient's past medical history, past surgical history, allergies, medications, social and family history and I have made updates where appropriate.    Review of Systems        PHYSICAL EXAM:  BP 115/74    Pulse 89    Temp 96.8 ??F (36 ??C) (Infrared)    Resp 16    Ht 5\' 7"  (1.702 m)    Wt 230 lb 3.2 oz (104.4 kg)    SpO2 96%    BMI 36.05 kg/m??     General Appearance: well developed and well- nourished, in no acute distress  Head: normocephalic and atraumatic  ENT: external ear and ear canal normal bilaterally, nose without deformity, nasal mucosa and turbinates normal without polyps, oropharynx normal, dentition is normal for age, no lipor gum lesions noted  Neck: supple and non-tender without mass, no thyromegaly or thyroid nodules, no cervical lymphadenopathy  Pulmonary/Chest: clear to auscultation bilaterally- no wheezes, rales or rhonchi, normal air movement, no respiratory distress or retractions  Cardiovascular: normal rate, regular rhythm, normal S1 and S2, no murmurs, rubs, clicks, or gallops, distal pulses intact  Extremities: no cyanosis, clubbing or edema of the lower extremities  Psych:  Normal affect without evidence of depression oranxiety, insight and judgement are appropriate, memory appears intact  Skin: warm and dry, no rash or  erythema      ASSESSMENT & PLAN  Yug was seen today for medicare awv.    Diagnoses and all orders for this visit:    Routine general medical examination at a health care facility    Other orders  -     POCT glycosylated hemoglobin (Hb A1C)    -DM2 uncontrolled, a1c is improved, increase metformin to 1000mg  BID  -Other Chronic issues stable, continue current medications  -Advised to call if any issues        No follow-ups on file.    Controlled Substances Monitoring:                     Forest received counseling on the following healthy behaviors: medication adherence  Reviewed prior labs and health maintenance.  Continue current medications, diet and exercise.  Discussed use, benefit, and side effects of prescribed medications. Barriers to medication compliance addressed.   Patient given educational materials - see patient instructions.    All patient questions answered.  Patient voiced understanding.

## 2019-12-12 MED ORDER — ATORVASTATIN CALCIUM 80 MG PO TABS
80 MG | ORAL_TABLET | ORAL | 3 refills | Status: DC
Start: 2019-12-12 — End: 2020-09-10

## 2019-12-26 ENCOUNTER — Encounter

## 2019-12-26 MED ORDER — OXYCODONE-ACETAMINOPHEN 10-325 MG PO TABS
10-325 MG | ORAL_TABLET | Freq: Three times a day (TID) | ORAL | 0 refills | Status: DC | PRN
Start: 2019-12-26 — End: 2020-01-23

## 2019-12-26 MED ORDER — DIAZEPAM 5 MG PO TABS
5 MG | ORAL_TABLET | Freq: Three times a day (TID) | ORAL | 0 refills | Status: DC | PRN
Start: 2019-12-26 — End: 2020-01-23

## 2020-01-23 ENCOUNTER — Encounter

## 2020-01-23 MED ORDER — OXYCODONE-ACETAMINOPHEN 10-325 MG PO TABS
10-325 MG | ORAL_TABLET | Freq: Three times a day (TID) | ORAL | 0 refills | Status: DC | PRN
Start: 2020-01-23 — End: 2020-02-20

## 2020-01-23 MED ORDER — DIAZEPAM 5 MG PO TABS
5 MG | ORAL_TABLET | Freq: Three times a day (TID) | ORAL | 0 refills | Status: DC | PRN
Start: 2020-01-23 — End: 2020-02-20

## 2020-01-23 NOTE — Telephone Encounter (Signed)
Last visit- 02/26/2018  Next visit- Visit date not found    Requested Prescriptions     Pending Prescriptions Disp Refills   ??? diazePAM (VALIUM) 5 MG tablet 90 tablet 0     Sig: Take 1 tablet by mouth every 8 hours as needed for Anxiety for up to 30 days.   ??? oxyCODONE-acetaminophen (PERCOCET) 10-325 MG per tablet 90 tablet 0     Sig: Take 1 tablet by mouth every 8 hours as needed for Pain for up to 30 days.

## 2020-01-24 ENCOUNTER — Encounter

## 2020-01-27 MED ORDER — PIOGLITAZONE HCL 15 MG PO TABS
15 MG | ORAL_TABLET | ORAL | 3 refills | Status: DC
Start: 2020-01-27 — End: 2021-02-01

## 2020-02-13 ENCOUNTER — Encounter

## 2020-02-13 MED ORDER — PAROXETINE HCL 40 MG PO TABS
40 MG | ORAL_TABLET | ORAL | 3 refills | Status: DC
Start: 2020-02-13 — End: 2021-02-22

## 2020-02-20 ENCOUNTER — Encounter

## 2020-02-20 MED ORDER — DIAZEPAM 5 MG PO TABS
5 MG | ORAL_TABLET | Freq: Three times a day (TID) | ORAL | 0 refills | Status: DC | PRN
Start: 2020-02-20 — End: 2020-03-19

## 2020-02-20 MED ORDER — OXYCODONE-ACETAMINOPHEN 10-325 MG PO TABS
10-325 MG | ORAL_TABLET | Freq: Three times a day (TID) | ORAL | 0 refills | Status: DC | PRN
Start: 2020-02-20 — End: 2020-03-19

## 2020-02-20 NOTE — Telephone Encounter (Signed)
Recent Visits  Date Type Provider Dept   12/10/19 Office Visit [name], DO [facility] [place] [facility]   11/26/19 Office Visit Jone Baseman, APRN - CNP [facility] [place] [facility]   10/02/19 Office Visit Jone Baseman, APRN - CNP [facility] [place] [facility]   08/13/19 Office Visit [name], DO [facility] [place] [facility]   07/02/19 Office Visit Jone Baseman, APRN - CNP [facility] [place] [facility]   04/02/19 Office Visit Jone Baseman, APRN - CNP [facility] [place] [facility]   03/05/19 Office Visit Jone Baseman, APRN - CNP [facility] [place] [facility]   12/04/18 Office Visit [name], DO [facility] [place] [facility]   09/03/18 Office Visit [name], DO [facility] [place] [facility]   Showing recent visits within past 540 days with a meds authorizing provider and meeting all other requirements  Future Appointments  No visits were found meeting these conditions.  Showing future appointments within next 150 days with a meds authorizing provider and meeting all other requirements    Future Appointments   Date Time Provider Department Center   11/11/2020 10:45 AM Zoheir Derl Barrow, MD [facility] [place]

## 2020-02-24 MED ORDER — ISOSORBIDE MONONITRATE ER 30 MG PO TB24
30 MG | ORAL_TABLET | ORAL | 3 refills | Status: AC
Start: 2020-02-24 — End: 2021-02-22

## 2020-03-01 ENCOUNTER — Encounter

## 2020-03-02 MED ORDER — PREGABALIN 50 MG PO CAPS
50 MG | ORAL_CAPSULE | ORAL | 5 refills | Status: DC
Start: 2020-03-02 — End: 2020-09-04

## 2020-03-19 ENCOUNTER — Encounter

## 2020-03-19 MED ORDER — OXYCODONE-ACETAMINOPHEN 10-325 MG PO TABS
10-325 MG | ORAL_TABLET | Freq: Three times a day (TID) | ORAL | 0 refills | Status: DC | PRN
Start: 2020-03-19 — End: 2020-04-14

## 2020-03-19 MED ORDER — DIAZEPAM 5 MG PO TABS
5 MG | ORAL_TABLET | Freq: Three times a day (TID) | ORAL | 0 refills | Status: DC | PRN
Start: 2020-03-19 — End: 2020-04-14

## 2020-04-14 ENCOUNTER — Encounter

## 2020-04-14 MED ORDER — OXYCODONE-ACETAMINOPHEN 10-325 MG PO TABS
10-325 MG | ORAL_TABLET | Freq: Three times a day (TID) | ORAL | 0 refills | Status: DC | PRN
Start: 2020-04-14 — End: 2020-05-14

## 2020-04-14 MED ORDER — DIAZEPAM 5 MG PO TABS
5 MG | ORAL_TABLET | Freq: Three times a day (TID) | ORAL | 0 refills | Status: DC | PRN
Start: 2020-04-14 — End: 2020-05-14

## 2020-05-14 ENCOUNTER — Encounter

## 2020-05-14 MED ORDER — DIAZEPAM 5 MG PO TABS
5 MG | ORAL_TABLET | Freq: Three times a day (TID) | ORAL | 0 refills | Status: DC | PRN
Start: 2020-05-14 — End: 2020-06-11

## 2020-05-14 MED ORDER — OXYCODONE-ACETAMINOPHEN 10-325 MG PO TABS
10-325 MG | ORAL_TABLET | Freq: Three times a day (TID) | ORAL | 0 refills | Status: DC | PRN
Start: 2020-05-14 — End: 2020-06-11

## 2020-05-16 ENCOUNTER — Encounter

## 2020-05-18 MED ORDER — MONTELUKAST SODIUM 10 MG PO TABS
10 MG | ORAL_TABLET | Freq: Every evening | ORAL | 3 refills | Status: DC
Start: 2020-05-18 — End: 2021-06-01

## 2020-05-23 ENCOUNTER — Encounter

## 2020-05-25 MED ORDER — TAMSULOSIN HCL 0.4 MG PO CAPS
0.4 MG | ORAL_CAPSULE | ORAL | 3 refills | Status: AC
Start: 2020-05-25 — End: 2021-05-31

## 2020-06-11 ENCOUNTER — Encounter

## 2020-06-11 MED ORDER — OXYCODONE-ACETAMINOPHEN 10-325 MG PO TABS
10-325 MG | ORAL_TABLET | Freq: Three times a day (TID) | ORAL | 0 refills | Status: DC | PRN
Start: 2020-06-11 — End: 2020-07-09

## 2020-06-11 MED ORDER — DIAZEPAM 5 MG PO TABS
5 MG | ORAL_TABLET | Freq: Three times a day (TID) | ORAL | 0 refills | Status: DC | PRN
Start: 2020-06-11 — End: 2020-07-09

## 2020-06-22 ENCOUNTER — Encounter

## 2020-06-23 MED ORDER — FLUTICASONE PROPIONATE 50 MCG/ACT NA SUSP
50 MCG/ACT | NASAL | 0 refills | Status: DC
Start: 2020-06-23 — End: 2020-08-10

## 2020-06-30 ENCOUNTER — Encounter

## 2020-06-30 MED ORDER — HANDICAP PLACARD
0 refills | Status: AC
Start: 2020-06-30 — End: ?

## 2020-06-30 MED ORDER — HANDICAP PLACARD
0 refills | Status: DC
Start: 2020-06-30 — End: 2020-06-30

## 2020-06-30 NOTE — Telephone Encounter (Signed)
Patient came into office asking for another handcap place card since the other one has expired a couple of months ago please advise. He is in the office with his wife for an appt for her.

## 2020-06-30 NOTE — Telephone Encounter (Signed)
I gave this to his wife.

## 2020-07-09 ENCOUNTER — Encounter

## 2020-07-09 MED ORDER — OXYCODONE-ACETAMINOPHEN 10-325 MG PO TABS
10-325 MG | ORAL_TABLET | Freq: Three times a day (TID) | ORAL | 0 refills | Status: DC | PRN
Start: 2020-07-09 — End: 2020-08-06

## 2020-07-09 MED ORDER — DIAZEPAM 5 MG PO TABS
5 MG | ORAL_TABLET | Freq: Three times a day (TID) | ORAL | 0 refills | Status: DC | PRN
Start: 2020-07-09 — End: 2020-08-06

## 2020-07-09 NOTE — Telephone Encounter (Signed)
Will give 1 month of medication(s).  Needs appt for any further refills.

## 2020-07-09 NOTE — Telephone Encounter (Signed)
Called pt and he will do the walk in on Monday. He voiced understanding.

## 2020-07-09 NOTE — Telephone Encounter (Signed)
Patient requesting the following to go to [facility] [address]  Please approve or refuse Rx request:  Requested Prescriptions     Pending Prescriptions Disp Refills   ??? diazePAM (VALIUM) 5 MG tablet 90 tablet 0     Sig: Take 1 tablet by mouth every 8 hours as needed for Anxiety for up to 30 days.   ??? oxyCODONE-acetaminophen (PERCOCET) 10-325 MG per tablet 90 tablet 0     Sig: Take 1 tablet by mouth every 8 hours as needed for Pain for up to 30 days.       Next appointment:  Visit date not found

## 2020-07-13 ENCOUNTER — Ambulatory Visit: Admit: 2020-07-13 | Discharge: 2020-07-13 | Payer: MEDICARE | Attending: Family Medicine | Primary: Family Medicine

## 2020-07-13 DIAGNOSIS — E119 Type 2 diabetes mellitus without complications: Secondary | ICD-10-CM

## 2020-07-13 LAB — POCT GLYCOSYLATED HEMOGLOBIN (HGB A1C): Hemoglobin A1C: 6.5 % — ABNORMAL HIGH (ref 4.3–5.7)

## 2020-07-13 NOTE — Progress Notes (Signed)
Micheal [name] is a 65 y.o. male that presents for     Chief Complaint   Patient presents with   ??? Leg Pain     both       BP 124/72    Pulse 78    Temp 97.6 ??F (36.4 ??C) (Infrared)    Resp 16    Ht 5\' 7"  (1.702 m)    Wt 229 lb 12.8 oz (104.2 kg)    SpO2 97%    BMI 35.99 kg/m??       HPI:      Chronic Pain    HPI:    Patient has chronic pain of the low back.  States that he feels like the pain has been worse since his back surgery.  Pain radiates to the legs.  Takes him about 45 min in the AM to get going.  Does feel like the percocet helps him to be functional.  Pain control: adequate  Improvement in function and ADLs?  yes  Current Medications:  Percocet q 8 hrs  Escalation of dosage recently?  no    Evidence for abuse or diversion? no   Seen specialist or pain clinic?: yes      Controlled Substances Monitoring:        Diabetes Type 2    Glucose control:   Does patient check blood glucoses at home?  No  Report of hypoglycemia: no  Lab Results   Component Value Date    LABA1C 6.5 (H) 07/13/2020     No results found for: EAG    Symptoms  Polyuria, Polydipsia or Polyphagia?   No  Chest Pain, SOB, or Palpitations? -  No  New Vision complaints? No  Paresthesias of the extremities?  No    Medications  Current medication were reviewed.  Compliant with medications?  yes  Medication side effects?  No  On ACE-I or ARB?  Yes  On antiplatelet therapy?  Yes  On Statin?  Yes      Exercise  Exercise? Limited due to pain  Wt Readings from Last 3 Encounters:   07/13/20 229 lb 12.8 oz (104.2 kg)   12/10/19 230 lb 3.2 oz (104.4 kg)   11/26/19 233 lb 6.4 oz (105.9 kg)       Diet discipline?:  Low salt, fat, sugar diet?  Doing much better recently    Blood pressure control:  BP Readings from Last 3 Encounters:   07/13/20 124/72   12/10/19 115/74   11/26/19 128/84       Lab Results   Component Value Date    LABMICR 1.28 09/03/2014       Lab Results   Component Value Date    LDLCALC 92 12/17/2018     HTN    Does patient check BP regularly  at home? - No  Current Medication regimen - Lisinopril, Metoprolol, Imdur  Tolerating medications well? - yes    Shortness of breath or chest pain? No  Headache or visual complaints? No  Neurologic changes like confusion? No  Extremity edema? No    BP Readings from Last 3 Encounters:   07/13/20 124/72   12/10/19 115/74   11/26/19 128/84           Health Maintenance   Topic Date Due   ??? Diabetic retinal exam  Never done   ??? DTaP/Tdap/Td vaccine (1 - Tdap) Never done   ??? Prostate Specific Antigen (PSA) Screening or Monitoring  11/08/2018   ??? Pneumococcal 65+  years Vaccine (3 - PPSV23 or PCV20) 06/12/2019   ??? AAA screen  Never done   ??? Diabetic foot exam  12/04/2019   ??? Lipids  12/17/2019   ??? Diabetic microalbuminuria test  03/04/2020   ??? Depression Screen  12/09/2020   ??? Annual Wellness Visit (AWV)  12/10/2020   ??? A1C test (Diabetic or Prediabetic)  07/13/2021   ??? Colorectal Cancer Screen  12/20/2022   ??? Flu vaccine  Completed   ??? Shingles vaccine  Completed   ??? COVID-19 Vaccine  Completed   ??? Hepatitis C screen  Completed   ??? Hepatitis A vaccine  Aged Out   ??? Hib vaccine  Aged Out   ??? Meningococcal (ACWY) vaccine  Aged Out       Past Medical History:   Diagnosis Date   ??? Arthritis     general   ??? CAD (coronary artery disease)     s/p stent placement-sees Dr. Gabrielle Dare   ??? Chronic back pain    ??? Diabetes mellitus (HCC)    ??? GERD (gastroesophageal reflux disease)    ??? Hyperlipidemia    ??? Hypertension        Past Surgical History:   Procedure Laterality Date   ??? [place]  [place]   ??? COLONOSCOPY Left 12/19/2017    COLONOSCOPY POLYPECTOMY HOT BIOPSY performed by Florian Buff, MD at [facility]   ??? CORONARY ARTERY BYPASS GRAFT  2009    6 vessel-[place]   ??? DIAGNOSTIC CARDIAC CATH LAB PROCEDURE     ??? ORTHOPEDIC SURGERY Left    ??? PTCA  07/04/2016   ??? TONSILLECTOMY         Social History     Tobacco Use   ??? Smoking status: Former Smoker     Packs/day: 1.00     Years: 15.00     Pack  years: 15.00     Types: Cigarettes     Quit date: 05/24/2008     Years since quitting: 12.1   ??? Smokeless tobacco: Never Used   Vaping Use   ??? Vaping Use: Never used   Substance Use Topics   ??? Alcohol use: Yes     Alcohol/week: 1.0 standard drink     Types: 1 Glasses of wine per week     Comment: once monthly stopped a over 2 years   ??? Drug use: No       Family History   Problem Relation Age of Onset   ??? Cancer Mother    ??? Heart Disease Brother    ??? Stroke Brother    ??? Diabetes Brother    ??? Early Death Brother    ??? High Blood Pressure Brother    ??? High Cholesterol Brother    ??? Mental Retardation Sister          I have reviewed the patient's past medical history, past surgical history, allergies, medications, social and family history and I have made updates where appropriate.    Review of Systems        PHYSICAL EXAM:  BP 124/72    Pulse 78    Temp 97.6 ??F (36.4 ??C) (Infrared)    Resp 16    Ht 5\' 7"  (1.702 m)    Wt 229 lb 12.8 oz (104.2 kg)    SpO2 97%    BMI 35.99 kg/m??     General Appearance: well developed and well- nourished, in no acute distress  Head: normocephalic and atraumatic  ENT: external ear and ear canal normal bilaterally, nose without deformity, nasal mucosa and turbinates normal without polyps, oropharynx normal, dentition is normal for age, no lipor gum lesions noted  Neck: supple and non-tender without mass, no thyromegaly or thyroid nodules, no cervical lymphadenopathy  Pulmonary/Chest: clear to auscultation bilaterally- no wheezes, rales or rhonchi, normal air movement, no respiratory distress or retractions  Cardiovascular: normal rate, regular rhythm, normal S1 and S2, no murmurs, rubs, clicks, or gallops, distal pulses intact  Extremities: no cyanosis, clubbing or edema of the lower extremities  Psych:  Normal affect without evidence of depression oranxiety, insight and judgement are appropriate, memory appears intact  Skin: warm and dry, no rash or erythema      ASSESSMENT & PLAN  Krystle was  seen today for leg pain.    Diagnoses and all orders for this visit:    Type 2 diabetes mellitus without complication, without long-term current use of insulin (HCC)  -     POCT glycosylated hemoglobin (Hb A1C)    Coronary artery disease of native heart with stable angina pectoris, unspecified vessel or lesion type [facility])    Essential hypertension    -DM2 improved, continue metformin, amaryl  -Other Chronic issues stable, continue current medications  -Advised to call if any issues        Return in about 6 months (around 01/12/2021).    Controlled Substances Monitoring:                     Miquel received counseling on the following healthy behaviors: medication adherence  Reviewed prior labs and health maintenance.  Continue current medications, diet and exercise.  Discussed use, benefit, and side effects of prescribed medications. Barriers to medication compliance addressed.   Patient given educational materials - see patient instructions.    All patient questions answered.  Patient voiced understanding.

## 2020-07-29 ENCOUNTER — Encounter

## 2020-07-30 MED ORDER — FENOFIBRATE 145 MG PO TABS
145 MG | ORAL_TABLET | ORAL | 3 refills | Status: AC
Start: 2020-07-30 — End: ?

## 2020-08-06 ENCOUNTER — Encounter

## 2020-08-06 MED ORDER — OXYCODONE-ACETAMINOPHEN 10-325 MG PO TABS
10-325 MG | ORAL_TABLET | Freq: Three times a day (TID) | ORAL | 0 refills | Status: AC | PRN
Start: 2020-08-06 — End: 2020-09-05

## 2020-08-06 MED ORDER — DIAZEPAM 5 MG PO TABS
5 MG | ORAL_TABLET | Freq: Three times a day (TID) | ORAL | 0 refills | Status: AC | PRN
Start: 2020-08-06 — End: 2020-09-05

## 2020-08-06 NOTE — Telephone Encounter (Signed)
Patient requesting   Please approve or refuse Rx request:  Requested Prescriptions     Pending Prescriptions Disp Refills   ??? diazePAM (VALIUM) 5 MG tablet 90 tablet 0     Sig: Take 1 tablet by mouth every 8 hours as needed for Anxiety for up to 30 days.   ??? oxyCODONE-acetaminophen (PERCOCET) 10-325 MG per tablet 90 tablet 0     Sig: Take 1 tablet by mouth every 8 hours as needed for Pain for up to 30 days.       Next appointment:  01/12/2021

## 2020-08-09 ENCOUNTER — Encounter

## 2020-08-10 ENCOUNTER — Encounter

## 2020-08-10 MED ORDER — FLUTICASONE PROPIONATE 50 MCG/ACT NA SUSP
50 MCG/ACT | NASAL | 3 refills | Status: DC
Start: 2020-08-10 — End: 2021-01-07

## 2020-08-11 MED ORDER — LISINOPRIL 40 MG PO TABS
40 MG | ORAL_TABLET | ORAL | 3 refills | Status: DC
Start: 2020-08-11 — End: 2021-08-09

## 2020-08-11 NOTE — Telephone Encounter (Signed)
Recent Visits  Date Type Provider Dept   07/13/20 Office Visit [name], DO [facility] [place] [facility]   12/10/19 Office Visit [name], DO [facility] [place] [facility]   11/26/19 Office Visit Jone Baseman, APRN - CNP [facility] [place] [facility]   10/02/19 Office Visit Jone Baseman, APRN - CNP [facility] [place] [facility]   08/13/19 Office Visit [name], DO [facility] [place] [facility]   07/02/19 Office Visit Jone Baseman, APRN - CNP [facility] [place] [facility]   04/02/19 Office Visit Jone Baseman, APRN - CNP [facility] [place] [facility]   03/05/19 Office Visit Jone Baseman, APRN - CNP [facility] [place] [facility]   Showing recent visits within past 540 days with a meds authorizing provider and meeting all other requirements  Future Appointments  No visits were found meeting these conditions.  Showing future appointments within next 150 days with a meds authorizing provider and meeting all other requirements     Future Appointments   Date Time Provider Department Center   11/11/2020 10:45 AM Zoheir Derl Barrow, MD [facility]   01/12/2021 12:40 PM [name], DO [name]

## 2020-08-22 ENCOUNTER — Encounter

## 2020-08-24 MED ORDER — ESOMEPRAZOLE MAGNESIUM 40 MG PO CPDR
40 MG | ORAL_CAPSULE | ORAL | 3 refills | Status: AC
Start: 2020-08-24 — End: 2021-09-20

## 2020-09-03 ENCOUNTER — Encounter

## 2020-09-03 MED ORDER — OXYCODONE-ACETAMINOPHEN 10-325 MG PO TABS
10-325 MG | ORAL_TABLET | Freq: Three times a day (TID) | ORAL | 0 refills | Status: DC | PRN
Start: 2020-09-03 — End: 2020-10-02

## 2020-09-03 MED ORDER — DIAZEPAM 5 MG PO TABS
5 MG | ORAL_TABLET | Freq: Three times a day (TID) | ORAL | 0 refills | Status: DC | PRN
Start: 2020-09-03 — End: 2020-10-02

## 2020-09-04 ENCOUNTER — Encounter

## 2020-09-04 MED ORDER — PREGABALIN 50 MG PO CAPS
50 MG | ORAL_CAPSULE | ORAL | 3 refills | Status: DC
Start: 2020-09-04 — End: 2021-02-08

## 2020-09-04 NOTE — Telephone Encounter (Signed)
Future Appointments   Date Time Provider Department Center   11/11/2020 10:45 AM Zoheir A Abdelbaki, MD [facility] [place]   01/12/2021 12:40 PM Mark D Kahle, DO [facility] [place]

## 2020-09-10 MED ORDER — ATORVASTATIN CALCIUM 80 MG PO TABS
80 MG | ORAL_TABLET | ORAL | 0 refills | Status: AC
Start: 2020-09-10 — End: 2021-04-13

## 2020-10-02 ENCOUNTER — Encounter

## 2020-10-02 MED ORDER — DIAZEPAM 5 MG PO TABS
5 MG | ORAL_TABLET | Freq: Three times a day (TID) | ORAL | 0 refills | Status: DC | PRN
Start: 2020-10-02 — End: 2020-10-29

## 2020-10-02 MED ORDER — OXYCODONE-ACETAMINOPHEN 10-325 MG PO TABS
10-325 MG | ORAL_TABLET | Freq: Three times a day (TID) | ORAL | 0 refills | Status: AC | PRN
Start: 2020-10-02 — End: 2020-11-01

## 2020-10-02 NOTE — Telephone Encounter (Signed)
Future Appointments   Date Time Provider Department Center   11/11/2020 10:45 AM Zoheir Derl Barrow, MD [facility]   01/12/2021 12:40 PM [name], DO [name]

## 2020-10-03 ENCOUNTER — Encounter

## 2020-10-05 MED ORDER — GLIMEPIRIDE 4 MG PO TABS
4 MG | ORAL_TABLET | ORAL | 3 refills | Status: AC
Start: 2020-10-05 — End: ?

## 2020-10-29 ENCOUNTER — Encounter

## 2020-10-29 MED ORDER — OXYCODONE-ACETAMINOPHEN 10-325 MG PO TABS
10-325 MG | ORAL_TABLET | Freq: Three times a day (TID) | ORAL | 0 refills | Status: DC | PRN
Start: 2020-10-29 — End: 2020-11-26

## 2020-10-29 MED ORDER — DIAZEPAM 5 MG PO TABS
5 MG | ORAL_TABLET | Freq: Three times a day (TID) | ORAL | 0 refills | Status: DC | PRN
Start: 2020-10-29 — End: 2020-11-26

## 2020-10-29 NOTE — Telephone Encounter (Signed)
Patient requesting the following   Please approve or refuse Rx request:  Requested Prescriptions     Pending Prescriptions Disp Refills    diazePAM (VALIUM) 5 MG tablet 90 tablet 0     Sig: Take 1 tablet by mouth every 8 hours as needed for Anxiety for up to 30 days.    oxyCODONE-acetaminophen (PERCOCET) 10-325 MG per tablet 90 tablet 0     Sig: Take 1 tablet by mouth every 8 hours as needed for Pain for up to 30 days.       Next appointment:  01/12/2021

## 2020-10-29 NOTE — Telephone Encounter (Signed)
Formatting of this note is different from the original.  Patient requesting the following   Please approve or refuse Rx request:  Requested Prescriptions     Pending Prescriptions Disp Refills    diazePAM (VALIUM) 5 MG tablet 90 tablet 0     Sig: Take 1 tablet by mouth every 8 hours as needed for Anxiety for up to 30 days.    oxyCODONE-acetaminophen (PERCOCET) 10-325 MG per tablet 90 tablet 0     Sig: Take 1 tablet by mouth every 8 hours as needed for Pain for up to 30 days.     Next appointment:  01/12/2021    Electronically signed by Leory Plowman, Incoming [facility] Source Conversion at 11/18/2020 10:16 AM EDT

## 2020-10-29 NOTE — Telephone Encounter (Signed)
 Formatting of this note is different from the original.  Patient requesting the following   Please approve or refuse Rx request:  Requested Prescriptions     Pending Prescriptions Disp Refills    diazePAM  (VALIUM ) 5 MG tablet 90 tablet 0     Sig: Take 1 tablet by mouth every 8 hours as needed for Anxiety for up to 30 days.    oxyCODONE -acetaminophen  (PERCOCET ) 10-325 MG per tablet 90 tablet 0     Sig: Take 1 tablet by mouth every 8 hours as needed for Pain for up to 30 days.     Next appointment:  01/12/2021    Electronically signed by Dannis Dy, Incoming [facility] Source Conversion at 11/18/2020 10:16 AM EDT

## 2020-11-11 ENCOUNTER — Encounter: Attending: Cardiovascular Disease | Primary: Family Medicine

## 2020-11-20 MED ORDER — METOPROLOL SUCCINATE ER 25 MG PO TB24
25 MG | ORAL_TABLET | ORAL | 3 refills | Status: AC
Start: 2020-11-20 — End: 2021-12-07

## 2020-11-26 ENCOUNTER — Encounter

## 2020-11-26 MED ORDER — DIAZEPAM 5 MG PO TABS
5 MG | ORAL_TABLET | Freq: Three times a day (TID) | ORAL | 0 refills | Status: AC | PRN
Start: 2020-11-26 — End: 2020-12-26

## 2020-11-26 MED ORDER — OXYCODONE-ACETAMINOPHEN 10-325 MG PO TABS
10-325 MG | ORAL_TABLET | Freq: Three times a day (TID) | ORAL | 0 refills | Status: AC | PRN
Start: 2020-11-26 — End: 2020-12-26

## 2020-11-26 NOTE — Telephone Encounter (Signed)
Patient requesting   Please approve or refuse Rx request:  Requested Prescriptions     Pending Prescriptions Disp Refills    diazePAM (VALIUM) 5 MG tablet 90 tablet 0     Sig: Take 1 tablet by mouth every 8 hours as needed for Anxiety for up to 30 days.    oxyCODONE-acetaminophen (PERCOCET) 10-325 MG per tablet 90 tablet 0     Sig: Take 1 tablet by mouth every 8 hours as needed for Pain for up to 30 days.       Next appointment:  01/12/2021

## 2020-12-11 ENCOUNTER — Encounter

## 2020-12-11 MED ORDER — TICAGRELOR 90 MG PO TABS
90 MG | ORAL_TABLET | ORAL | 3 refills | Status: AC
Start: 2020-12-11 — End: 2021-12-13

## 2020-12-23 ENCOUNTER — Encounter

## 2020-12-23 MED ORDER — DIAZEPAM 5 MG PO TABS
5 MG | ORAL_TABLET | Freq: Three times a day (TID) | ORAL | 0 refills | Status: DC | PRN
Start: 2020-12-23 — End: 2021-01-19

## 2020-12-23 MED ORDER — OXYCODONE-ACETAMINOPHEN 10-325 MG PO TABS
10-325 MG | ORAL_TABLET | Freq: Three times a day (TID) | ORAL | 0 refills | Status: DC | PRN
Start: 2020-12-23 — End: 2021-01-19

## 2020-12-25 ENCOUNTER — Encounter

## 2020-12-25 MED ORDER — METFORMIN HCL 1000 MG PO TABS
1000 MG | ORAL_TABLET | ORAL | 3 refills | Status: AC
Start: 2020-12-25 — End: 2022-01-03

## 2021-01-07 ENCOUNTER — Encounter

## 2021-01-07 MED ORDER — FLUTICASONE PROPIONATE 50 MCG/ACT NA SUSP
50 MCG/ACT | NASAL | 3 refills | Status: DC
Start: 2021-01-07 — End: 2021-04-30

## 2021-01-11 ENCOUNTER — Encounter

## 2021-01-11 MED ORDER — ALBUTEROL SULFATE HFA 108 (90 BASE) MCG/ACT IN AERS
10890 (90 Base) MCG/ACT | RESPIRATORY_TRACT | 0 refills | Status: AC
Start: 2021-01-11 — End: 2021-03-30

## 2021-01-12 ENCOUNTER — Encounter: Attending: Family Medicine | Primary: Family Medicine

## 2021-01-19 ENCOUNTER — Encounter

## 2021-01-19 MED ORDER — DIAZEPAM 5 MG PO TABS
5 MG | ORAL_TABLET | Freq: Three times a day (TID) | ORAL | 0 refills | Status: DC | PRN
Start: 2021-01-19 — End: 2021-02-16

## 2021-01-19 MED ORDER — OXYCODONE-ACETAMINOPHEN 10-325 MG PO TABS
10-325 MG | ORAL_TABLET | Freq: Three times a day (TID) | ORAL | 0 refills | Status: AC | PRN
Start: 2021-01-19 — End: 2021-02-18

## 2021-01-31 ENCOUNTER — Encounter

## 2021-02-01 MED ORDER — PIOGLITAZONE HCL 15 MG PO TABS
15 MG | ORAL_TABLET | ORAL | 3 refills | Status: AC
Start: 2021-02-01 — End: ?

## 2021-02-05 ENCOUNTER — Encounter

## 2021-02-08 MED ORDER — PREGABALIN 50 MG PO CAPS
50 MG | ORAL_CAPSULE | ORAL | 0 refills | Status: AC
Start: 2021-02-08 — End: 2021-03-11

## 2021-02-16 ENCOUNTER — Encounter

## 2021-02-16 MED ORDER — DIAZEPAM 5 MG PO TABS
5 MG | ORAL_TABLET | Freq: Three times a day (TID) | ORAL | 0 refills | Status: AC | PRN
Start: 2021-02-16 — End: 2021-03-18

## 2021-02-16 MED ORDER — OXYCODONE-ACETAMINOPHEN 10-325 MG PO TABS
10-325 MG | ORAL_TABLET | Freq: Three times a day (TID) | ORAL | 0 refills | Status: DC | PRN
Start: 2021-02-16 — End: 2021-03-16

## 2021-02-16 NOTE — Telephone Encounter (Signed)
Patient requests the following   Pharmacy confirmed   Please approve or refuse Rx request:  Requested Prescriptions     Pending Prescriptions Disp Refills    diazePAM (VALIUM) 5 MG tablet 90 tablet 0     Sig: Take 1 tablet by mouth every 8 hours as needed for Anxiety for up to 30 days.    oxyCODONE-acetaminophen (PERCOCET) 10-325 MG per tablet 90 tablet 0     Sig: Take 1 tablet by mouth every 8 hours as needed for Pain for up to 30 days.       Next appointment:  Visit date not found

## 2021-02-21 ENCOUNTER — Encounter

## 2021-02-22 MED ORDER — PAROXETINE HCL 40 MG PO TABS
40 MG | ORAL_TABLET | ORAL | 3 refills | Status: AC
Start: 2021-02-22 — End: 2022-04-12

## 2021-02-22 MED ORDER — ISOSORBIDE MONONITRATE ER 30 MG PO TB24
30 MG | ORAL_TABLET | ORAL | 3 refills | Status: AC
Start: 2021-02-22 — End: 2021-12-07

## 2021-03-15 ENCOUNTER — Encounter

## 2021-03-15 MED ORDER — PREGABALIN 50 MG PO CAPS
50 MG | ORAL_CAPSULE | ORAL | 0 refills | Status: AC
Start: 2021-03-15 — End: 2021-04-14

## 2021-03-15 NOTE — Telephone Encounter (Signed)
Lyrica sent  Needs follow up

## 2021-03-16 ENCOUNTER — Encounter

## 2021-03-16 MED ORDER — OXYCODONE-ACETAMINOPHEN 10-325 MG PO TABS
10-325 MG | ORAL_TABLET | Freq: Three times a day (TID) | ORAL | 0 refills | Status: AC | PRN
Start: 2021-03-16 — End: 2021-04-15

## 2021-03-16 MED ORDER — DIAZEPAM 5 MG PO TABS
5 MG | ORAL_TABLET | Freq: Three times a day (TID) | ORAL | 0 refills | Status: DC | PRN
Start: 2021-03-16 — End: 2021-04-12

## 2021-03-16 NOTE — Telephone Encounter (Signed)
Patient requesting the following (pharmacy conf)  Please approve or refuse Rx request:  Requested Prescriptions     Pending Prescriptions Disp Refills    oxyCODONE-acetaminophen (PERCOCET) 10-325 MG per tablet 90 tablet 0     Sig: Take 1 tablet by mouth every 8 hours as needed for Pain for up to 30 days.    diazePAM (VALIUM) 5 MG tablet 90 tablet 0     Sig: Take 1 tablet by mouth every 8 hours as needed for Anxiety for up to 30 days.       Next appointment:  Visit date not found

## 2021-03-16 NOTE — Telephone Encounter (Signed)
Appointment scheduled for 3/7 at 10 am

## 2021-03-30 ENCOUNTER — Encounter: Admit: 2021-03-30 | Discharge: 2021-03-30 | Payer: MEDICARE | Attending: Family | Primary: Family Medicine

## 2021-03-30 DIAGNOSIS — F1399 Sedative, hypnotic or anxiolytic use, unspecified with unspecified sedative, hypnotic or anxiolytic-induced disorder: Secondary | ICD-10-CM

## 2021-03-30 LAB — POCT GLYCOSYLATED HEMOGLOBIN (HGB A1C): Hemoglobin A1C: 6.6 % — ABNORMAL HIGH (ref 4.3–5.7)

## 2021-03-30 MED ORDER — PREGABALIN 100 MG PO CAPS
100 MG | ORAL_CAPSULE | Freq: Three times a day (TID) | ORAL | 0 refills | Status: DC
Start: 2021-03-30 — End: 2021-05-01

## 2021-03-30 MED ORDER — BUDESONIDE-FORMOTEROL FUMARATE 80-4.5 MCG/ACT IN AERO
Freq: Two times a day (BID) | RESPIRATORY_TRACT | 3 refills | Status: AC
Start: 2021-03-30 — End: 2022-01-28

## 2021-03-30 MED ORDER — ALBUTEROL SULFATE HFA 108 (90 BASE) MCG/ACT IN AERS
108 (90 Base) MCG/ACT | Freq: Four times a day (QID) | RESPIRATORY_TRACT | 3 refills | Status: AC | PRN
Start: 2021-03-30 — End: 2022-01-28

## 2021-03-30 NOTE — Progress Notes (Signed)
Micheal [name] is a 66 y.o. male for Diabetes (Asthma , pain in back )      Diabetes Type 2    Glucose control:   Does patient check blood glucoses at home?  Yes  Report of hypoglycemia: no  Lab Results   Component Value Date    LABA1C 6.6 (H) 03/30/2021     No results found for: EAG    Symptoms  Polyuria, Polydipsia or Polyphagia?   No  Chest Pain, SOB, or Palpitations? -  No  New Vision complaints? No  Paresthesias of the extremities?  No    Medications  Current medication were reviewed.  Compliant with medications?  yes  Medication side effects?  No  On ACE-I or ARB?  Yes  On antiplatelet therapy?  Yes  On Statin?  Yes    Last Diabetic Eye Exam: not utd    Exercise  Exercise? Walks dog but can't go very far due to pain and gait  Wt Readings from Last 3 Encounters:   03/30/21 227 lb (103 kg)   07/13/20 229 lb 12.8 oz (104.2 kg)   12/10/19 230 lb 3.2 oz (104.4 kg)       Diet discipline?:  Low salt, fat, sugar diet?  yes    Blood pressure control:  BP Readings from Last 3 Encounters:   03/30/21 134/82   07/13/20 124/72   12/10/19 115/74       Lab Results   Component Value Date    LABMICR 1.28 09/03/2014       Lab Results   Component Value Date    LDLCALC 92 12/17/2018     HTN    Does patient check BP regularly at home? - Yes  Current Medication regimen - Metoprolol, Imdur, Lisinopril  Tolerating medications well? - yes    Shortness of breath or chest pain? Yes - asthma has been worse lately but no chest pain  Headache or visual complaints? No  Neurologic changes like confusion? No  Extremity edema? No    BP Readings from Last 3 Encounters:   03/30/21 134/82   07/13/20 124/72   12/10/19 115/74     Back pain uncontrolled  Using Percocet  Gives him relief for about 2-3 hours    Wheezing  Asthma has been worse lately  Been using Albuterol prn  Not on maintenance inhaler    Physical Exam    BP 134/82 (Site: Right Upper Arm, Position: Sitting, Cuff Size: Large Adult)    Pulse 63    Temp 98.2 ??F (36.8 ??C) (Oral)    Resp 18     Ht 5\' 7"  (1.702 m)    Wt 227 lb (103 kg)    SpO2 98%    BMI 35.55 kg/m??   Appearance: alert, well appearing, and in no distress, normal appearing weight and well hydrated.  Neck exam - supple, no significant adenopathy.  CVS exam: normal rate, regular rhythm, normal S1, S2, no murmurs, rubs, clicks or gallops.  Lungs: clear to auscultation, no wheezes, rales or rhonchi, symmetric air entry.  Abdomen:  BS normal, soft, non tender, non distended, no rebound or guarding  Mental Status: normal mood, affect behavior, speech, dress,  and thought processes.  Neuro:  Alert, muscle tone grossly normal  Skin exam: normal coloration and turgor, no rashes, no suspicious skin lesions noted.  Foot exam:  No pedal edema, no erythematous lesions noted b/l, sensation wnl b/l, PT and TP pulses wnl b/l    ASSESSMENT &  PLAN  Mariel was seen today for diabetes.    Diagnoses and all orders for this visit:    Sedative, hypnotic or anxiolytic use, unspecified with unspecified sedative, hypnotic or anxiolytic-induced disorder    Wheezing  -     albuterol sulfate HFA (PROVENTIL;VENTOLIN;PROAIR) 108 (90 Base) MCG/ACT inhaler; Inhale 2 puffs into the lungs every 6 hours as needed for Wheezing  -     budesonide-formoterol (SYMBICORT) 80-4.5 MCG/ACT AERO; Inhale 2 puffs into the lungs 2 times daily    History of asthma  -     albuterol sulfate HFA (PROVENTIL;VENTOLIN;PROAIR) 108 (90 Base) MCG/ACT inhaler; Inhale 2 puffs into the lungs every 6 hours as needed for Wheezing  -     budesonide-formoterol (SYMBICORT) 80-4.5 MCG/ACT AERO; Inhale 2 puffs into the lungs 2 times daily    Severe obesity (BMI 35.0-39.9) with comorbidity (HCC)    Coronary artery disease of native heart with stable angina pectoris, unspecified vessel or lesion type (HCC)    Type 2 diabetes mellitus without complication, without long-term current use of insulin (HCC)    Sedative, 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 Max Daily Amount: 300 mg    Other orders  -     POCT glycosylated hemoglobin (Hb A1C)      Return in about 6 months (around 09/30/2021).

## 2021-04-09 ENCOUNTER — Encounter

## 2021-04-09 NOTE — Telephone Encounter (Signed)
Recent Visits  Date Type Provider Dept   03/30/21 Office Visit Jone Baseman, APRN - CNP [facility]   07/13/20 Office Visit [name], DO [facility]   12/10/19 Office Visit [name], DO [facility]   11/26/19 Office Visit Jone Baseman, APRN - CNP [facility] [place] [facility]   Showing recent visits within past 540 days with a meds authorizing provider and meeting all other requirements  Future Appointments  No visits were found meeting these conditions.  Showing future appointments within next 150 days with a meds authorizing provider and meeting all other requirements

## 2021-04-12 MED ORDER — OXYCODONE-ACETAMINOPHEN 10-325 MG PO TABS
10-325 MG | ORAL_TABLET | Freq: Three times a day (TID) | ORAL | 0 refills | Status: AC | PRN
Start: 2021-04-12 — End: 2021-05-12

## 2021-04-12 MED ORDER — DIAZEPAM 5 MG PO TABS
5 MG | ORAL_TABLET | Freq: Three times a day (TID) | ORAL | 0 refills | Status: DC | PRN
Start: 2021-04-12 — End: 2021-05-11

## 2021-04-12 NOTE — Telephone Encounter (Signed)
LM for patient to call back to schedule an appointment before lipitor can be refilled.

## 2021-04-13 MED ORDER — ATORVASTATIN CALCIUM 80 MG PO TABS
80 MG | ORAL_TABLET | Freq: Every evening | ORAL | 0 refills | Status: AC
Start: 2021-04-13 — End: 2021-07-05

## 2021-04-13 NOTE — Telephone Encounter (Signed)
Patient calling stating that the pharmacy told him that the med could not be filled because he hasnt seen dr Gabrielle Dare in [place]. I see the note here and attempted to schedule pt for the first available appt, when I asked him about preferences for appts he became argumentative. He asked me if he didn't have to take this med any more. I told him that I was not saying that, I was only scheduling him for an appt per the notes that he is needing one before it can be filled. He said that the dr said he didn't need to see him for a year. I let him know he hasnt been seen since 11/06/2019, which was a year and a half ago. He said that's because no one ever gave him an appt. I let him know his last appt was missed and he said that wasn't him.  The patient would like to speak with a nurse regarding his medication. He said he hasnt had it in a week.

## 2021-04-13 NOTE — Telephone Encounter (Signed)
Spoke to patient, appointment has been scheduled. Med pended in separate refill encounter.

## 2021-04-30 ENCOUNTER — Encounter

## 2021-04-30 MED ORDER — FLUTICASONE PROPIONATE 50 MCG/ACT NA SUSP
50 MCG/ACT | NASAL | 3 refills | Status: AC
Start: 2021-04-30 — End: 2021-10-20

## 2021-04-30 NOTE — Telephone Encounter (Signed)
Recent Visits  Date Type Provider Dept   03/30/21 Office Visit Jone Baseman, APRN - CNP [facility]   07/13/20 Office Visit [name], DO [facility]   12/10/19 Office Visit [name], DO [facility]   11/26/19 Office Visit Jone Baseman, APRN - CNP [facility]   Showing recent visits within past 540 days with a meds authorizing provider and meeting all other requirements  Future Appointments  No visits were found meeting these conditions.  Showing future appointments within next 150 days with a meds authorizing provider and meeting all other requirements       Future Appointments   Date Time Provider Department Center   06/07/2021 11:15 AM Zoheir Derl Barrow, MD [facility] [place]   09/30/2021 10:00 AM Jone Baseman, APRN - CNP [facility] [place]

## 2021-05-01 ENCOUNTER — Encounter

## 2021-05-01 NOTE — Progress Notes (Signed)
Lyrica refilled

## 2021-05-02 MED ORDER — PREGABALIN 100 MG PO CAPS
100 MG | ORAL_CAPSULE | Freq: Three times a day (TID) | ORAL | 5 refills | Status: AC
Start: 2021-05-02 — End: 2021-06-07

## 2021-05-11 ENCOUNTER — Encounter

## 2021-05-11 MED ORDER — OXYCODONE-ACETAMINOPHEN 10-325 MG PO TABS
10-325 MG | ORAL_TABLET | Freq: Three times a day (TID) | ORAL | 0 refills | Status: DC | PRN
Start: 2021-05-11 — End: 2021-06-09

## 2021-05-11 MED ORDER — DIAZEPAM 5 MG PO TABS
5 MG | ORAL_TABLET | Freq: Three times a day (TID) | ORAL | 0 refills | Status: DC | PRN
Start: 2021-05-11 — End: 2021-06-09

## 2021-05-11 NOTE — Telephone Encounter (Signed)
Patient request the following to go to [facility] [address]  Please approve or refuse Rx request:  Requested Prescriptions     Pending Prescriptions Disp Refills    oxyCODONE-acetaminophen (PERCOCET) 10-325 MG per tablet 90 tablet 0     Sig: Take 1 tablet by mouth every 8 hours as needed for Pain for up to 30 days.    diazePAM (VALIUM) 5 MG tablet 90 tablet 0     Sig: Take 1 tablet by mouth every 8 hours as needed for Anxiety for up to 30 days.       Next appointment:  09/30/2021

## 2021-05-31 ENCOUNTER — Encounter

## 2021-05-31 MED ORDER — TAMSULOSIN HCL 0.4 MG PO CAPS
0.4 MG | ORAL_CAPSULE | Freq: Two times a day (BID) | ORAL | 3 refills | Status: AC
Start: 2021-05-31 — End: 2022-04-12

## 2021-05-31 NOTE — Telephone Encounter (Signed)
Future Appointments   Date Time Provider [place]   06/07/2021 11:15 AM Rabun, MD [facility] [place]   09/30/2021 10:00 AM Antony Contras, APRN - CNP [facility] [place]

## 2021-06-01 ENCOUNTER — Encounter

## 2021-06-01 MED ORDER — MONTELUKAST SODIUM 10 MG PO TABS
10 MG | ORAL_TABLET | Freq: Every evening | ORAL | 3 refills | Status: AC
Start: 2021-06-01 — End: 2022-05-16

## 2021-06-07 ENCOUNTER — Encounter: Admit: 2021-06-07 | Discharge: 2021-06-07 | Payer: MEDICARE | Attending: Cardiovascular Disease | Primary: Family

## 2021-06-07 DIAGNOSIS — I25810 Atherosclerosis of coronary artery bypass graft(s) without angina pectoris: Secondary | ICD-10-CM

## 2021-06-07 NOTE — Progress Notes (Signed)
Patient here for check up.  EKG done today.   Patient complains of fatigue.

## 2021-06-07 NOTE — Progress Notes (Signed)
[name]  [place] [address]  Dept: [phone]  Dept Fax: [phone]  Loc: [phone]    Visit Date: 06/07/2021    Micheal [name] is a 66 y.o. male who presents todayfor:  Chief Complaint   Patient presents with    Check-Up    Coronary Artery Disease    Hypertension    Hyperlipidemia    Shortness of Breath   Some more dyspnea  Exertional   More than baseline  On inhalers  Does have COPD   Used to smoke  Know CABG and stents  Does have Dm for a while  Seems fair for now  Known HTN   Seems under control   On statins for hyperlipidemia  Doing well with that         HPI:  HPI  Past Medical History:   Diagnosis Date    Arthritis     general    CAD (coronary artery disease)     s/p stent placement-sees Dr. Irene Limbo    Chronic back pain     Diabetes mellitus ([place])     GERD (gastroesophageal reflux disease)     Hyperlipidemia     Hypertension       Past Surgical History:   Procedure Laterality Date    COLONOSCOPY  2015    [place], [place]    COLONOSCOPY Left 12/19/2017    COLONOSCOPY POLYPECTOMY HOT BIOPSY performed by Earmon Phoenix, MD at [facility]    CORONARY ARTERY BYPASS GRAFT  2009    [address] CATH LAB PROCEDURE      ORTHOPEDIC SURGERY Left     PTCA  07/04/2016    TONSILLECTOMY       Family History   Problem Relation Age of Onset    Cancer Mother     Heart Disease Brother     Stroke Brother     Diabetes Brother     Early Death Brother     High Blood Pressure Brother     High Cholesterol Brother     Mental Retardation Sister      Social History     Tobacco Use    Smoking status: Former     Packs/day: 1.00     Years: 15.00     Pack years: 15.00     Types: Cigarettes     Quit date: 05/24/2008     Years since quitting: 13.0    Smokeless tobacco: Never   Substance Use Topics    Alcohol use: Yes     Alcohol/week: 1.0 standard drink     Types: 1 Glasses of wine per week     Comment:  once monthly stopped a over 2 years      Current Outpatient Medications   Medication Sig Dispense Refill    montelukast (SINGULAIR) 10 MG tablet Take 1 tablet by mouth nightly 90 tablet 3    tamsulosin (FLOMAX) 0.4 MG capsule Take 1 capsule by mouth 2 times daily 180 capsule 3    oxyCODONE-acetaminophen (PERCOCET) 10-325 MG per tablet Take 1 tablet by mouth every 8 hours as needed for Pain for up to 30 days. 90 tablet 0    diazePAM (VALIUM) 5 MG tablet Take 1 tablet by mouth every 8 hours as needed for Anxiety for up to 30 days. 90 tablet 0    pregabalin (LYRICA) 100  MG capsule Take 1 capsule by mouth 3 times daily for 30 days. Max Daily Amount: 300 mg 90 capsule 5    fluticasone (FLONASE) 50 MCG/ACT nasal spray Use 2 spray(s) in each nostril once daily 16 g 3    atorvastatin (LIPITOR) 80 MG tablet Take 1 tablet by mouth nightly 90 tablet 0    albuterol sulfate HFA (PROVENTIL;VENTOLIN;PROAIR) 108 (90 Base) MCG/ACT inhaler Inhale 2 puffs into the lungs every 6 hours as needed for Wheezing 18 g 3    budesonide-formoterol (SYMBICORT) 80-4.5 MCG/ACT AERO Inhale 2 puffs into the lungs 2 times daily 10.2 g 3    isosorbide mononitrate (IMDUR) 30 MG extended release tablet Take 1 tablet by mouth once daily 90 tablet 3    PARoxetine (PAXIL) 40 MG tablet Take 1 tablet by mouth once daily 90 tablet 3    pioglitazone (ACTOS) 15 MG tablet Take 1 tablet by mouth once daily 90 tablet 3    metFORMIN (GLUCOPHAGE) 1000 MG tablet TAKE 1 TABLET BY MOUTH TWICE DAILY WITH MEALS 180 tablet 3    ticagrelor (BRILINTA) 90 MG TABS tablet Take 1 tablet by mouth twice daily 180 tablet 3    metoprolol succinate (TOPROL XL) 25 MG extended release tablet Take 1 tablet by mouth once daily 90 tablet 3    glimepiride (AMARYL) 4 MG tablet Take 1 tablet by mouth twice daily 180 tablet 3    esomeprazole (NEXIUM) 40 MG delayed release capsule TAKE 1 CAPSULE BY MOUTH ONCE DAILY IN THE MORNING BEFORE BREAKFAST 90 capsule 3    lisinopril (PRINIVIL;ZESTRIL)  40 MG tablet Take 1 tablet by mouth once daily 90 tablet 3    fenofibrate (TRICOR) 145 MG tablet Take 1 tablet by mouth once daily 90 tablet 3    Handicap Placard MISC by Does not apply route Expires 06/30/25 1 each 0    olopatadine (PATANOL) 0.1 % ophthalmic solution Place 1 drop into both eyes 2 times daily 3 each 3    hydrocortisone 2.5 % cream Apply to external ear BID PRN 3 each 3    NARCAN 4 MG/0.1ML LIQD nasal spray ADMINISTER A SINGLE SPRAY INTRANASALLY INTO ONE NOSTRIL. CALL 911. MAY REPEAT X 1.      blood glucose monitor kit and supplies Dispense sufficient amount for indicated testing frequency plus additional to accommodate PRN testing needs. Dispense all needed supplies to include: monitor, strips, lancing device, lancets, control solutions, alcohol swabs. 1 kit 0    nitroGLYCERIN (NITROSTAT) 0.4 MG SL tablet Place 1 tablet under the tongue every 5 minutes as needed for Chest pain 25 tablet 2    Multiple Vitamins-Minerals (CENTRUM SILVER ULTRA MENS) TABS Take 1 tablet by mouth daily       No current facility-administered medications for this visit.     Allergies   Allergen Reactions    Gabapentin      Health Maintenance   Topic Date Due    Diabetic retinal exam  Never done    DTaP/Tdap/Td vaccine (1 - Tdap) Never done    AAA screen  Never done    Diabetic foot exam  12/04/2019    Lipids  12/17/2019    GFR test (Diabetes, CKD 3-4, OR last GFR 15-59)  12/17/2019    Diabetic Alb to Cr ratio (uACR) test  03/04/2020    Annual Wellness Visit (AWV)  12/10/2020    A1C test (Diabetic or Prediabetic)  03/31/2022    Depression Screen  03/31/2022    Colorectal  Cancer Screen  12/20/2022    Flu vaccine  Completed    Shingles vaccine  Completed    Pneumococcal 65+ years Vaccine  Completed    COVID-19 Vaccine  Completed    Hepatitis C screen  Completed    Hepatitis A vaccine  Aged Out    Hib vaccine  Aged Out    Meningococcal (ACWY) vaccine  Aged Out    Pneumococcal 0-64 years Vaccine  Discontinued    HIV screen   Discontinued    Prostate Specific Antigen (PSA) Screening or Monitoring  Discontinued       Subjective:  General:   No fever, no chills, No fatigue or weight loss  Pulmonary:    Some more dyspnea, no wheezing  Cardiac:    Denies recent chest pain,   GI:     No nausea or vomiting, no abdominal pain  Neuro:    No dizziness or light headedness,   Musculoskeletal:  No recent active issues  Extremities:   No edema, no obvious claudication       Objective:  General:   Well developed, well nourished  Lungs:   Clear to auscultation  Heart:    Normal S1 S2, Slight murmur. no rubs, no gallops  Abdomen:   Soft, non tender, no organomegalies, positive bowel sounds  Extremities:   No edema, no cyanosis, good peripheral pulses  Neurological:   Awake, alert, oriented. No obvious focal deficits  Musculoskelatal:  No obvious deformities   BP 122/60   Pulse (!) 110   Ht '5\' 7"'  (1.702 m)   Wt 224 lb (101.6 kg)   BMI 35.08 kg/m     Assessment:      Diagnosis Orders   1. Coronary artery disease involving coronary bypass graft of native heart without angina pectoris  EKG 12 Lead      2. Primary hypertension        3. Familial hypercholesterolemia        Symptoms as above  Symptoms are concerning   Higher risk   ECG in office was done today. I reviewed the ECG. No acute findings      Plan:  No follow-ups on file.  As above  Non invasive cardiac testing will be ordered to further evaluate for any ischemic or structural heart disease as a cause of the patient symptoms. We will proceed with a Stress Cardiolite test and echo soon.  Continue risk factor modification and medical management    Thank you for allowing me to participate in the care of your patient. Please don't hesitate to contact me regarding any further issues related to the patient care    Orders Placed:  Orders Placed This Encounter   Procedures    EKG 12 Lead     Order Specific Question:   Reason for Exam?     Answer:   Other       Prescribed:  No orders of the defined  types were placed in this encounter.         Discussed use, benefit, and side effects of prescribed medications. All patient questions answered. Pt voicedunderstanding. Instructed to continue current medications, diet and exercise. Continue risk factor modification and medical management. Patient agreed with treatment plan. Follow up as directed.    Electronically signedby Veverly Fells, MD on 06/07/2021 at 11:32 AM

## 2021-06-09 ENCOUNTER — Encounter

## 2021-06-09 MED ORDER — DIAZEPAM 5 MG PO TABS
5 MG | ORAL_TABLET | Freq: Three times a day (TID) | ORAL | 0 refills | Status: AC | PRN
Start: 2021-06-09 — End: 2021-07-09

## 2021-06-09 MED ORDER — OXYCODONE-ACETAMINOPHEN 10-325 MG PO TABS
10-325 MG | ORAL_TABLET | Freq: Three times a day (TID) | ORAL | 0 refills | Status: AC | PRN
Start: 2021-06-09 — End: 2021-07-09

## 2021-07-05 MED ORDER — ATORVASTATIN CALCIUM 80 MG PO TABS
80 MG | ORAL_TABLET | Freq: Every evening | ORAL | 1 refills | Status: DC
Start: 2021-07-05 — End: 2021-12-15

## 2021-07-08 ENCOUNTER — Encounter

## 2021-07-08 MED ORDER — OXYCODONE-ACETAMINOPHEN 10-325 MG PO TABS
10-325 MG | ORAL_TABLET | Freq: Three times a day (TID) | ORAL | 0 refills | Status: AC | PRN
Start: 2021-07-08 — End: 2021-08-07

## 2021-07-08 MED ORDER — DIAZEPAM 5 MG PO TABS
5 MG | ORAL_TABLET | Freq: Three times a day (TID) | ORAL | 0 refills | Status: AC | PRN
Start: 2021-07-08 — End: 2021-08-07

## 2021-07-21 ENCOUNTER — Inpatient Hospital Stay: Admit: 2021-07-21 | Payer: MEDICARE | Primary: Family

## 2021-07-21 ENCOUNTER — Inpatient Hospital Stay: Admit: 2021-07-21 | Discharge: 2021-11-09 | Payer: MEDICARE | Primary: Family

## 2021-07-21 DIAGNOSIS — I25810 Atherosclerosis of coronary artery bypass graft(s) without angina pectoris: Secondary | ICD-10-CM

## 2021-07-21 DIAGNOSIS — I2510 Atherosclerotic heart disease of native coronary artery without angina pectoris: Secondary | ICD-10-CM

## 2021-07-21 LAB — ECHOCARDIOGRAM COMPLETE 2D W DOPPLER W COLOR: Left Ventricular Ejection Fraction: 55

## 2021-07-21 MED ORDER — TECHNETIUM TC 99M SESTAMIBI IV KIT
Freq: Once | INTRAVENOUS | Status: AC | PRN
Start: 2021-07-21 — End: 2021-07-21
  Administered 2021-07-21: 15:00:00 8.4 via INTRAVENOUS

## 2021-07-21 MED ORDER — REGADENOSON 0.4 MG/5ML IV SOLN
0.4 MG/5ML | INTRAVENOUS | Status: AC
Start: 2021-07-21 — End: ?

## 2021-07-21 MED ORDER — TECHNETIUM TC 99M SESTAMIBI IV KIT
Freq: Once | INTRAVENOUS | Status: AC | PRN
Start: 2021-07-21 — End: 2021-07-21
  Administered 2021-07-21: 16:00:00 29.9 via INTRAVENOUS

## 2021-07-21 MED FILL — REGADENOSON 0.4 MG/5ML IV SOLN: 0.4 MG/5ML | INTRAVENOUS | Qty: 5

## 2021-08-05 ENCOUNTER — Encounter

## 2021-08-05 MED ORDER — DIAZEPAM 5 MG PO TABS
5 MG | ORAL_TABLET | Freq: Three times a day (TID) | ORAL | 0 refills | Status: DC | PRN
Start: 2021-08-05 — End: 2021-09-02

## 2021-08-05 MED ORDER — OXYCODONE-ACETAMINOPHEN 10-325 MG PO TABS
10-325 MG | ORAL_TABLET | Freq: Three times a day (TID) | ORAL | 0 refills | Status: AC | PRN
Start: 2021-08-05 — End: 2021-09-04

## 2021-08-06 ENCOUNTER — Encounter

## 2021-08-06 MED ORDER — OXYCODONE-ACETAMINOPHEN 10-325 MG PO TABS
10-325 MG | ORAL_TABLET | Freq: Three times a day (TID) | ORAL | 0 refills | Status: DC | PRN
Start: 2021-08-06 — End: 2021-09-02

## 2021-08-09 ENCOUNTER — Encounter

## 2021-08-09 MED ORDER — LISINOPRIL 40 MG PO TABS
40 MG | ORAL_TABLET | Freq: Every day | ORAL | 3 refills | Status: AC
Start: 2021-08-09 — End: 2022-08-08

## 2021-08-09 NOTE — Telephone Encounter (Signed)
Recent Visits  Date Type Provider Dept   03/30/21 Office Visit Jone Baseman, APRN - CNP [facility]   07/13/20 Office Visit [name], DO [facility] [place] [facility]   Showing recent visits within past 540 days with a meds authorizing provider and meeting all other requirements  Future Appointments  Date Type Provider Dept   09/30/21 Appointment Jone Baseman, APRN - CNP [facility]   Showing future appointments within next 150 days with a meds authorizing provider and meeting all other requirements     Future Appointments   Date Time Provider Department Center   09/30/2021 10:00 AM Jone Baseman, APRN - CNP [facility] [name]   12/15/2021 11:15 AM Zoheir Derl Barrow, MD [facility] [place]

## 2021-09-02 ENCOUNTER — Encounter

## 2021-09-02 MED ORDER — DIAZEPAM 5 MG PO TABS
5 MG | ORAL_TABLET | Freq: Three times a day (TID) | ORAL | 0 refills | Status: AC | PRN
Start: 2021-09-02 — End: 2021-10-02

## 2021-09-02 MED ORDER — OXYCODONE-ACETAMINOPHEN 10-325 MG PO TABS
10-325 MG | ORAL_TABLET | Freq: Three times a day (TID) | ORAL | 0 refills | Status: AC | PRN
Start: 2021-09-02 — End: 2021-10-02

## 2021-09-20 ENCOUNTER — Encounter

## 2021-09-20 MED ORDER — PREGABALIN 100 MG PO CAPS
100 MG | ORAL_CAPSULE | Freq: Three times a day (TID) | ORAL | 5 refills | Status: AC
Start: 2021-09-20 — End: 2022-03-19

## 2021-09-20 MED ORDER — ESOMEPRAZOLE MAGNESIUM 40 MG PO CPDR
40 MG | ORAL_CAPSULE | ORAL | 3 refills | Status: AC
Start: 2021-09-20 — End: 2022-08-30

## 2021-09-30 ENCOUNTER — Encounter: Payer: MEDICARE | Attending: Family | Primary: Family

## 2021-10-01 ENCOUNTER — Encounter

## 2021-10-01 MED ORDER — OXYCODONE-ACETAMINOPHEN 10-325 MG PO TABS
10-325 MG | ORAL_TABLET | Freq: Three times a day (TID) | ORAL | 0 refills | Status: AC | PRN
Start: 2021-10-01 — End: 2021-10-31

## 2021-10-01 MED ORDER — DIAZEPAM 5 MG PO TABS
5 MG | ORAL_TABLET | Freq: Three times a day (TID) | ORAL | 0 refills | Status: AC | PRN
Start: 2021-10-01 — End: 2021-10-31

## 2021-10-01 NOTE — Telephone Encounter (Signed)
Recent Visits  Date Type Provider Dept   03/30/21 Office Visit Jone Baseman, APRN - CNP [facility]   07/13/20 Office Visit [name], DO [facility] [facility]   Showing recent visits within past 540 days with a meds authorizing provider and meeting all other requirements  Future Appointments  Date Type Provider Dept   10/08/21 Appointment Jone Baseman, APRN - CNP [facility]   Showing future appointments within next 150 days with a meds authorizing provider and meeting all other requirements     Future Appointments   Date Time Provider Department Center   10/08/2021 11:40 AM Jone Baseman, APRN - CNP [facility] [name]   12/15/2021 11:15 AM Zoheir Derl Barrow, MD [facility] [place]

## 2021-10-08 ENCOUNTER — Encounter: Payer: MEDICARE | Attending: Family | Primary: Family

## 2021-10-12 ENCOUNTER — Encounter: Admit: 2021-10-12 | Discharge: 2021-10-12 | Payer: MEDICARE | Attending: Family | Primary: Family

## 2021-10-12 DIAGNOSIS — Z13220 Encounter for screening for lipoid disorders: Secondary | ICD-10-CM

## 2021-10-12 LAB — POCT GLYCOSYLATED HEMOGLOBIN (HGB A1C): Hemoglobin A1C: 6.9 % — ABNORMAL HIGH (ref 4.3–5.7)

## 2021-10-12 MED ORDER — METHYLPREDNISOLONE ACETATE 80 MG/ML IJ SUSP
80 MG/ML | Freq: Once | INTRAMUSCULAR | Status: AC
Start: 2021-10-12 — End: 2021-10-12
  Administered 2021-10-12: 15:00:00 60 mg via INTRAMUSCULAR

## 2021-10-12 MED ORDER — PREDNISONE 20 MG PO TABS
20 MG | ORAL_TABLET | Freq: Every day | ORAL | 0 refills | Status: AC
Start: 2021-10-12 — End: 2021-10-17

## 2021-10-12 MED ORDER — BLOOD GLUCOSE MONITORING SUPPL DEVI
PACK | 0 refills | Status: AC
Start: 2021-10-12 — End: ?

## 2021-10-12 MED ORDER — HYDROXYZINE HCL 25 MG PO TABS
25 MG | ORAL_TABLET | Freq: Three times a day (TID) | ORAL | 1 refills | Status: AC | PRN
Start: 2021-10-12 — End: 2022-01-25

## 2021-10-12 MED ORDER — BLOOD GLUCOSE TEST VI STRP
ORAL_STRIP | 0 refills | Status: AC
Start: 2021-10-12 — End: ?

## 2021-10-12 MED ORDER — LANCETS MISC
2 refills | Status: DC
Start: 2021-10-12 — End: 2022-08-01

## 2021-10-12 MED ORDER — GLIMEPIRIDE 4 MG PO TABS
4 MG | ORAL_TABLET | ORAL | 3 refills | Status: DC
Start: 2021-10-12 — End: 2022-01-28

## 2021-10-12 NOTE — Progress Notes (Unsigned)
Vaccine Information Sheet, "Influenza - Inactivated"  given to Micheal [name], or parent/legal guardian of  Micheal [name] and verbalized understanding.    Patient responses:    Have you ever had a reaction to a flu vaccine? No  Do you have an allergy to eggs, neomycin or polymixin?  No  Do you have an allergy to Thimerosal, contact lens solution, or Merthiolate? No  Have you ever had Guillian Barre Syndrome?  No  Do you have any current illness?  No  Do you have a temperature above 100 degrees? No  Are you pregnant? No  If pregnant, permission obtained from physician? No  Do you have an active neurological disorder? No      Flu vaccine given per order. Please see immunization tab.

## 2021-10-12 NOTE — Progress Notes (Unsigned)
Micheal [name] is a 66 y.o. male for Diabetes      Diabetes Type 2    Glucose control:   Does patient check blood glucoses at home? No needs new machine  Report of hypoglycemia:   Lab Results   Component Value Date    LABA1C 6.9 (H) 10/12/2021     No results found for: "EAG"    Symptoms  Polyuria, Polydipsia or Polyphagia?   No  Chest Pain, SOB, or Palpitations? -  Yes - sob with exertion  New Vision complaints? {RESPONSES; YES/NO KKXFGHW:29937}  Paresthesias of the extremities?  {RESPONSES; YES/NO JIRCVEL:38101}    Medications  Current medication were reviewed.  Compliant with medications?  {yes no:314532}  Medication side effects?  {RESPONSES; YES/NO CAPITAL:18887}  On ACE-I or ARB?  {RESPONSES; YES/NO BPZWCHE:52778}  On antiplatelet therapy?  {RESPONSES; YES/NO EUMPNTI:14431}  On Statin?  {RESPONSES; YES/NO VQMGQQP:61950}    Last Diabetic Eye Exam: ***    Exercise  Exercise? {RESPONSES; YES/NO DTOIZTI:45809}  Wt Readings from Last 3 Encounters:   10/12/21 229 lb 3.2 oz (104 kg)   07/21/21 203 lb (92.1 kg)   06/07/21 224 lb (101.6 kg)       Diet discipline?:  Low salt, fat, sugar diet?  {yes no:314532}    Blood pressure control:  BP Readings from Last 3 Encounters:   10/12/21 122/78   06/07/21 122/60   03/30/21 134/82     Chronic Pain    Patient has chronic pain of the back and neck.  Pain control: inadequate just the last few weeks  Improvement in function and ADLs?  yes  Present for:  years  Current Medications: Percocet    Escalation of dosage recently?  no    Evidence for abuse or diversion? no   Seen specialist or pain clinic?: yes  Last UDS:  not done today  Pain contract: yes    Worsening back pain    Controlled Substances Monitoring:      No results found for: "[id number]"    Lab Results   Component Value Date    LDLCALC 92 12/17/2018       Physical Exam    BP 122/78   Pulse 72   Temp 97.9 F (36.6 C) (Oral)   Resp 16   Ht _0  (1.702 m)   Wt 229 lb 3.2 oz (104 kg)   SpO2 96%   BMI 35.90 kg/m    Appearance: alert, well appearing, and in no distress, normal appearing weight and well hydrated.  Neck exam - supple, no significant adenopathy.  CVS exam: normal rate, regular rhythm, normal S1, S2, no murmurs, rubs, clicks or gallops.  Lungs: clear to auscultation, no wheezes, rales or rhonchi, symmetric air entry.  Abdomen:  BS normal, soft, non tender, non distended, no rebound or guarding  Mental Status: normal mood, affect behavior, speech, dress,  and thought processes.  Neuro:  Alert, muscle tone grossly normal  Skin exam: normal coloration and turgor, no rashes, no suspicious skin lesions noted.  Foot exam:  No pedal edema, no erythematous lesions noted b/l, sensation wnl b/l, PT and TP pulses wnl b/l    ASSESSMENT & PLAN  Tremaine was seen today for diabetes.    Diagnoses and all orders for this visit:    Screening for hyperlipidemia  -     Lipid Panel; Future    Type 2 diabetes mellitus without complication, without long-term current use of insulin ([place])  -  Lipid Panel; Future  -     POCT glycosylated hemoglobin (Hb A1C); Future  -     glimepiride (AMARYL) 4 MG tablet; Take 1 tablet by mouth twice daily  -     CBC with Auto Differential; Future  -     Comprehensive Metabolic Panel; Future  -     TSH; Future  -     T4, Free; Future  -     Lipid Panel; Future  -     blood glucose monitor kit and supplies; Dispense sufficient amount for indicated testing frequency plus additional to accommodate PRN testing needs. Dispense all needed supplies to include: monitor, strips, lancing device, lancets, control solutions, alcohol swabs.  -     blood glucose monitor strips; Test 1 times a day & as needed for symptoms of irregular blood glucose. Dispense sufficient amount for indicated testing frequency plus additional to accommodate PRN testing needs.  -     Lancets MISC; Check blood sugar q daily, Dx E11.9  -     HM DIABETES FOOT EXAM  -     Influenza, FLUAD, (age 77 y+), IM, PF, 0.5 mL    Chronic bilateral low  back pain with bilateral sciatica  -     methylPREDNISolone acetate (DEPO-MEDROL) injection 60 mg  -     predniSONE (DELTASONE) 20 MG tablet; Take 2 tablets by mouth daily for 5 days  -     hydrOXYzine HCl (ATARAX) 25 MG tablet; Take 1 tablet by mouth every 8 hours as needed (pain and spasming, take with pain medication)    Essential hypertension  -     Lipid Panel; Future  -     POCT glycosylated hemoglobin (Hb A1C); Future  -     glimepiride (AMARYL) 4 MG tablet; Take 1 tablet by mouth twice daily  -     CBC with Auto Differential; Future  -     Comprehensive Metabolic Panel; Future  -     TSH; Future  -     T4, Free; Future  -     Lipid Panel; Future  -     blood glucose monitor kit and supplies; Dispense sufficient amount for indicated testing frequency plus additional to accommodate PRN testing needs. Dispense all needed supplies to include: monitor, strips, lancing device, lancets, control solutions, alcohol swabs.  -     blood glucose monitor strips; Test 1 times a day & as needed for symptoms of irregular blood glucose. Dispense sufficient amount for indicated testing frequency plus additional to accommodate PRN testing needs.  -     Lancets MISC; Check blood sugar q daily, Dx E11.9  -     HM DIABETES FOOT EXAM  -     Influenza, FLUAD, (age 93 y+), IM, PF, 0.5 mL    Encounter for vaccination  -     Influenza, FLUAD, (age 51 y+), IM, PF, 0.5 mL    Other orders  -     POCT glycosylated hemoglobin (Hb A1C)      ***ozempic  No follow-ups on file.

## 2021-10-12 NOTE — Progress Notes (Unsigned)
Did a walk test with patient.   Patient dropped down to 88% at 1 minute and 55 seconds into the 6 minute walk test

## 2021-10-19 MED ORDER — OZEMPIC (0.25 OR 0.5 MG/DOSE) 2 MG/1.5ML SC SOPN
2 MG/1.5ML | SUBCUTANEOUS | 1 refills | Status: DC
Start: 2021-10-19 — End: 2022-01-03

## 2021-10-20 ENCOUNTER — Encounter

## 2021-10-20 MED ORDER — FLUTICASONE PROPIONATE 50 MCG/ACT NA SUSP
50 MCG/ACT | NASAL | 0 refills | Status: DC
Start: 2021-10-20 — End: 2021-12-07

## 2021-10-20 NOTE — Telephone Encounter (Signed)
Recent Visits  Date Type Provider Dept   10/12/21 Office Visit Antony Contras, APRN - CNP [facility]   03/30/21 Office Visit Antony Contras, APRN - CNP [facility]   07/13/20 Office Visit Kyra Leyland, DO [facility] recent visits within past 540 days with a meds authorizing provider and meeting all other requirements  Future Appointments  Date Type Provider Dept   10/22/21 Appointment Antony Contras, APRN - CNP [facility]   01/14/22 Appointment Antony Contras, APRN - CNP [facility]   Showing future appointments within next 150 days with a meds authorizing provider and meeting all other requirements

## 2021-10-21 ENCOUNTER — Encounter: Payer: MEDICARE | Attending: Family | Primary: Family

## 2021-10-21 NOTE — Telephone Encounter (Signed)
-----   Message from Antony Contras, APRN - CNP sent at 10/19/2021  4:58 PM EDT -----  Please see if ozempic got approved  If so will need to stop Glimepiride  May need nurse visit for teaching

## 2021-10-21 NOTE — Telephone Encounter (Signed)
PA electronically initiated here

## 2021-10-21 NOTE — Telephone Encounter (Signed)
See previous msg

## 2021-10-21 NOTE — Telephone Encounter (Signed)
Pt did get the ozempic\started it today  Will stop Glimepiride

## 2021-10-21 NOTE — Telephone Encounter (Signed)
-----   Message from Willodean Rosenthal sent at 10/21/2021  2:28 PM EDT -----  Subject: Message to Provider    QUESTIONS  Information for Provider? please call pt back we tried returning the call   but no one answered   ---------------------------------------------------------------------------  --------------  Rod Can INFO  [phone]; OK to leave message on voicemail  ---------------------------------------------------------------------------  --------------  SCRIPT ANSWERS  undefined

## 2021-10-22 ENCOUNTER — Encounter: Admit: 2021-10-22 | Discharge: 2021-10-22 | Payer: MEDICARE | Attending: Family | Primary: Family

## 2021-10-22 DIAGNOSIS — Z0000 Encounter for general adult medical examination without abnormal findings: Secondary | ICD-10-CM

## 2021-10-22 NOTE — Patient Instructions (Signed)
Preventing Falls: Care Instructions    Talk to your doctor about the medicines you take. Ask if any of them increase the risk of falls and whether they can be changed or stopped.   Try to exercise regularly. It can help improve your strength and balance. This can help lower your risk of falling.     Practice fall safety and prevention.    Wear low-heeled shoes that fit well and give your feet good support. Talk to your doctor if you have foot problems that make this hard.  Carry a cellphone or wear a medical alert device that you can use to call for help.  Use stepladders instead of chairs to reach high objects. Don't climb if you're at risk for falls. Ask for help, if needed.  Wear the correct eyeglasses, if you need them.    Make your home safer.    Remove rugs, cords, clutter, and furniture from walkways.  Keep your house well lit. Use night-lights in hallways and bathrooms.  Install and use sturdy handrails on stairways.  Wear nonskid footwear, even inside. Don't walk barefoot or in socks without shoes.    Be safe outside.    Use handrails, curb cuts, and ramps whenever possible.  Keep your hands free by using a shoulder bag or backpack.  Try to walk in well-lit areas. Watch out for uneven ground, changes in pavement, and debris.  Be careful in the winter. Walk on the grass or gravel when sidewalks are slippery. Use de-icer on steps and walkways. Add non-slip devices to shoes.    Put grab bars and nonskid mats in your shower or tub and near the toilet. Try to use a shower chair or bath bench when bathing.   Get into a tub or shower by putting in your weaker leg first. Get out with your strong side first. Have a phone or medical alert device in the bathroom with you.   Where can you learn more?  Go to https://www.bennett.info/ and enter G117 to learn more about "Preventing Falls: Care Instructions."  Current as of: July 18, 2023Content Version: 13.8   2006-2023 Healthwise,  Incorporated.   Care instructions adapted under license by [facility]. If you have questions about a medical condition or this instruction, always ask your healthcare professional. [place] any warranty or liability for your use of this information.           Learning About Dental Care for Older Adults  Dental care for older adults: Overview  Dental care for older people is much the same as for younger adults. But older adults do have concerns that younger adults do not. Older adults may have problems with gum disease and decay on the roots of their teeth. They may need missing teeth replaced or broken fillings fixed. Or they may have dentures that need to be cared for. Some older adults may have trouble holding a toothbrush.  You can help remind the person you are caring for to brush and floss their teeth or to clean their dentures. In some cases, you may need to do the brushing and other dental care tasks. People who have trouble using their hands or who have dementia may need this extra help.  How can you help with dental care?  Normal dental care  To keep the teeth and gums healthy:  Brush the teeth with fluoride toothpaste twice a day--in the morning and at night--and floss at least once a day. Plaque can quickly build  up on the teeth of older adults.  Watch for the signs of gum disease. These signs include gums that bleed after brushing or after eating hard foods, such as apples.  See a dentist regularly. Many experts recommend checkups every 6 months.  Keep the dentist up to date on any new medications the person is taking.  Encourage a balanced diet that includes whole grains, vegetables, and fruits, and that is low in saturated fat and sodium.  Encourage the person you're caring for not to use tobacco products. They can affect dental and general health.  Many older adults have a fixed income and feel that they can't afford dental care. But most towns and cities have programs in  which dentists help older adults by lowering fees. Contact your area's public health offices or social services for information about dental care in your area.  Using a toothbrush  Older adults with arthritis sometimes have trouble brushing their teeth because they can't easily hold the toothbrush. Their hands and fingers may be stiff, painful, or weak. If this is the case, you can:  Offer an IT trainer toothbrush.  Enlarge the handle of a non-electric toothbrush by wrapping a sponge, an elastic bandage, or adhesive tape around it.  Push the toothbrush handle through a ball made of rubber or soft foam.  Make the handle longer and thicker by taping Popsicle sticks or tongue depressors to it.  You may also be able to buy special toothbrushes, toothpaste dispensers, and floss holders.  Your doctor may recommend a soft-bristle toothbrush if the person you care for bleeds easily. Bleeding can happen because of a health problem or from certain medicines.  A toothpaste for sensitive teeth may help if the person you care for has sensitive teeth.  How do you brush and floss someone's teeth?  If the person you are caring for has a hard time cleaning their teeth on their own, you may need to brush and floss their teeth for them. It may be easiest to have the person sit and face away from you, and to sit or stand behind them. That way you can steady their head against your arm as you reach around to floss and brush their teeth. Choose a place that has good lighting and is comfortable for both of you.  Before you begin, gather your supplies. You will need gloves, floss, a toothbrush, and a container to hold water if you are not near a sink. Wash and dry your hands well and put on gloves. Start by flossing:  Gently work a piece of floss between each of the teeth toward the gums. A plastic flossing tool may make this easier, and they are available at most drugstores.  Curve the floss around each tooth into a U-shape and gently slide  it under the gum line.  Move the floss firmly up and down several times to scrape off the plaque.  After you've finished flossing, throw away the used floss and begin brushing:  Wet the brush and apply toothpaste.  Place the brush at a 45-degree angle where the teeth meet the gums. Press firmly, and move the brush in small circles over the surface of the teeth.  Be careful not to brush too hard. Vigorous brushing can make the gums pull away from the teeth and can scratch the tooth enamel.  Brush all surfaces of the teeth, on the tongue side and on the cheek side. Pay special attention to the front teeth and all surfaces of  the back teeth.  Brush chewing surfaces with short back-and-forth strokes.  After you've finished, help the person rinse the remaining toothpaste from their mouth.  Where can you learn more?  Go to https://www.bennett.info/ and enter F944 to learn more about "[place] for Older Adults."  Current as of: November 13, 2022Content Version: 13.8   2006-2023 Healthwise, Incorporated.   Care instructions adapted under license by [facility]. If you have questions about a medical condition or this instruction, always ask your healthcare professional. [place] any warranty or liability for your use of this information.           Hearing Loss: Care Instructions  Overview     Hearing loss is a sudden or slow decrease in how well you hear. It can range from slight to profound. Permanent hearing loss can occur with aging. It also can happen when you are exposed long-term to loud noise. Examples include listening to loud music, riding motorcycles, or being around other loud machines.  Hearing loss can affect your work and home life. It can make you feel lonely or depressed. You may feel that you have lost your independence. But hearing aids and other devices can help you hear better and feel connected to others.  Follow-up care is a key  part of your treatment and safety. Be sure to make and go to all appointments, and call your doctor if you are having problems. It's also a good idea to know your test results and keep a list of the medicines you take.  How can you care for yourself at home?  Avoid loud noises whenever possible. This helps keep your hearing from getting worse.  Always wear hearing protection around loud noises.  Wear a hearing aid as directed.  A professional can help you pick a hearing aid that will work best for you.  You can also get hearing aids over the counter for mild to moderate hearing loss.  Have hearing tests as your doctor suggests. They can show whether your hearing has changed. Your hearing aid may need to be adjusted.  Use other devices as needed. These may include:  Telephone amplifiers and hearing aids that can connect to a television, stereo, radio, or microphone.  Devices that use lights or vibrations. These alert you to the doorbell, a ringing telephone, or a baby monitor.  Television closed-captioning. This shows the words at the bottom of the screen. Most new TVs can do this.  TTY (text telephone). This lets you type messages back and forth on the telephone instead of talking or listening. These devices are also called TDD. When messages are typed on the keyboard, they are sent over the phone line to a receiving TTY. The message is shown on a monitor.  Use text messaging, social media, and email if it is hard for you to communicate by telephone.  Try to learn a listening technique called speechreading. It is not lipreading. You pay attention to people's gestures, expressions, posture, and tone of voice. These clues can help you understand what a person is saying. Face the person you are talking to, and have them face you. Make sure the lighting is good. You need to see the other person's face clearly.  Think about counseling if you need help to adjust to your hearing loss.  When should you call for help?  Watch  closely for changes in your health, and be sure to contact your doctor if:   You think  your hearing is getting worse.    You have new symptoms, such as dizziness or nausea.   Where can you learn more?  Go to https://www.bennett.info/ and enter R798 to learn more about "Hearing Loss: Care Instructions."  Current as of: February 28, 2023Content Version: 13.8   2006-2023 Healthwise, Incorporated.   Care instructions adapted under license by [facility]. If you have questions about a medical condition or this instruction, always ask your healthcare professional. [place] any warranty or liability for your use of this information.           Learning About Vision Tests  What are vision tests?     The four most common vision tests are visual acuity tests, refraction, visual field tests, and color vision tests.  Visual acuity (sharpness) tests  These tests are used:  To see if you need glasses or contact lenses.  To monitor an eye problem.  To check an eye injury.  Visual acuity tests are done as part of routine exams. You may also have this test when you get your driver's license or apply for some types of jobs.  Visual field tests  These tests are used:  To check for vision loss in any area of your range of vision.  To screen for certain eye diseases.  To look for nerve damage after a stroke, head injury, or other problem that could reduce blood flow to the brain.  Refraction and color tests  A refraction test is done to find the right prescription for glasses and contact lenses.  A color vision test is done to check for color blindness.  Color vision is often tested as part of a routine exam. You may also have this test when you apply for a job where recognizing different colors is important, such as truck driving, Research officer, trade union, or the [employer].  How are vision tests done?  Visual acuity test   You cover one eye at a time.  You read aloud from a wall chart across the  room.  You read aloud from a small card that you hold in your hand.  Refraction   You look into a special device.  The device puts lenses of different strengths in front of each eye to see how strong your glasses or contact lenses need to be.  Visual field tests   Your doctor may have you look through special machines.  Or your doctor may simply have you stare straight ahead while they move a finger into and out of your field of vision.  Color vision test   You look at pieces of printed test patterns in various colors. You say what number or symbol you see.  Your doctor may have you trace the number or symbol using a pointer.  How do these tests feel?  There is very little chance of having a problem from this test. If dilating drops are used for a vision test, they may make the eyes sting and cause a medicine taste in the mouth.  Follow-up care is a key part of your treatment and safety. Be sure to make and go to all appointments, and call your doctor if you are having problems. It's also a good idea to know your test results and keep a list of the medicines you take.  Where can you learn more?  Go to https://www.bennett.info/ and enter G551 to learn more about "Learning About Vision Tests."  Current as of: June 6, 2023Content Version: 13.8   2006-2023  Healthwise, Incorporated.   Care instructions adapted under license by [facility]. If you have questions about a medical condition or this instruction, always ask your healthcare professional. [place] any warranty or liability for your use of this information.           Advance Directives: Care Instructions  Overview  An advance directive is a legal way to state your wishes at the end of your life. It tells your family and your doctor what to do if you can't say what you want.  There are two main types of advance directives. You can change them any time your wishes change.  Living will.  This form tells your  family and your doctor your wishes about life support and other treatment. The form is also called a declaration.  Medical power of attorney.  This form lets you name a person to make treatment decisions for you when you can't speak for yourself. This person is called a health care agent (health care proxy, health care surrogate). The form is also called a durable power of attorney for health care.  If you do not have an advance directive, decisions about your medical care may be made by a family member, or by a doctor or a judge who doesn't know you.  It may help to think of an advance directive as a gift to the people who care for you. If you have one, they won't have to make tough decisions by themselves.  For more information, including forms for your state, see the [place] website (RebankingSpace.hu).  Follow-up care is a key part of your treatment and safety. Be sure to make and go to all appointments, and call your doctor if you are having problems. It's also a good idea to know your test results and keep a list of the medicines you take.  What should you include in an advance directive?  Many states have a unique advance directive form. (It may ask you to address specific issues.) Or you might use a universal form that's approved by many states.  If your form doesn't tell you what to address, it may be hard to know what to include in your advance directive. Use the questions below to help you get started.  Who do you want to make decisions about your medical care if you are not able to?  What life-support measures do you want if you have a serious illness that gets worse over time or can't be cured?  What are you most afraid of that might happen? (Maybe you're afraid of having pain, losing your independence, or being kept alive by machines.)  Where would you prefer to die? (Your home? A hospital? A nursing home?)  Do you want to donate your organs when you die?  Do you want  certain religious practices performed before you die?  When should you call for help?  Be sure to contact your doctor if you have any questions.  Where can you learn more?  Go to https://www.bennett.info/ and enter R264 to learn more about "Advance Directives: Care Instructions."  Current as of: March 26, 2023Content Version: 13.8   2006-2023 Healthwise, Incorporated.   Care instructions adapted under license by [facility]. If you have questions about a medical condition or this instruction, always ask your healthcare professional. [place] any warranty or liability for your use of this information.           Starting a Weight Loss Plan: Care Instructions  Overview     If you're thinking about losing weight, it can be hard to know where to start. Your doctor can help you set up a weight loss plan that best meets your needs. You may want to take a class on nutrition or exercise, or you could join a weight loss support group. If you have questions about how to make changes to your eating or exercise habits, ask your doctor about seeing a registered dietitian or an exercise specialist.  It can be a big challenge to lose weight. But you don't have to make huge changes at once. Make small changes, and stick with them. When those changes become habit, add a few more changes.  If you don't think you're ready to make changes right now, try to pick a date in the future. Make an appointment to see your doctor to discuss whether the time is right for you to start a plan.  Follow-up care is a key part of your treatment and safety. Be sure to make and go to all appointments, and call your doctor if you are having problems. It's also a good idea to know your test results and keep a list of the medicines you take.  How can you care for yourself at home?  Set realistic goals. Many people expect to lose much more weight than is likely. A weight loss of 5% to 10% of your body  weight may be enough to improve your health.  Get family and friends involved to provide support. Talk to them about why you are trying to lose weight, and ask them to help. They can help by participating in exercise and having meals with you, even if they may be eating something different.  Find what works best for you. If you do not have time or do not like to cook, a program that offers meal replacement bars or shakes may be better for you. Or if you like to prepare meals, finding a plan that includes daily menus and recipes may be best.  Ask your doctor about other health professionals who can help you achieve your weight loss goals.  A dietitian can help you make healthy changes in your diet.  An exercise specialist or personal trainer can help you develop a safe and effective exercise program.  A counselor or psychiatrist can help you cope with issues such as depression, anxiety, or family problems that can make it hard to focus on weight loss.  Consider joining a support group for people who are trying to lose weight. Your doctor can suggest groups in your area.  Where can you learn more?  Go to https://www.bennett.info/ and enter U357 to learn more about "Starting a Weight Loss Plan: Care Instructions."  Current as of: February 28, 2023Content Version: 13.8   2006-2023 Healthwise, Incorporated.   Care instructions adapted under license by [facility]. If you have questions about a medical condition or this instruction, always ask your healthcare professional. [place] any warranty or liability for your use of this information.           A Healthy Heart: Care Instructions  Your Care Instructions     Coronary artery disease, also called heart disease, occurs when a substance called plaque builds up in the vessels that supply oxygen-rich blood to your heart muscle. This can narrow the blood vessels and reduce blood flow. A heart attack happens when blood  flow is completely blocked. A high-fat diet, smoking, and other factors increase  the risk of heart disease.  Your doctor has found that you have a chance of having heart disease. You can do lots of things to keep your heart healthy. It may not be easy, but you can change your diet, exercise more, and quit smoking. These steps really work to lower your chance of heart disease.  Follow-up care is a key part of your treatment and safety. Be sure to make and go to all appointments, and call your doctor if you are having problems. It's also a good idea to know your test results and keep a list of the medicines you take.  How can you care for yourself at home?  Diet   Use less salt when you cook and eat. This helps lower your blood pressure. Taste food before salting. Add only a little salt when you think you need it. With time, your taste buds will adjust to less salt.    Eat fewer snack items, fast foods, canned soups, and other high-salt, high-fat, processed foods.    Read food labels and try to avoid saturated and trans fats. They increase your risk of heart disease by raising cholesterol levels.    Limit the amount of solid fat-butter, margarine, and shortening-you eat. Use olive, peanut, or canola oil when you cook. Bake, broil, and steam foods instead of frying them.    Eat a variety of fruit and vegetables every day. Dark green, deep orange, red, or yellow fruits and vegetables are especially good for you. Examples include spinach, carrots, peaches, and berries.    Foods high in fiber can reduce your cholesterol and provide important vitamins and minerals. High-fiber foods include whole-grain cereals and breads, oatmeal, beans, brown rice, citrus fruits, and apples.    Eat lean proteins. Heart-healthy proteins include seafood, lean meats and poultry, eggs, beans, peas, nuts, seeds, and soy products.    Limit drinks and foods with added sugar. These include candy, desserts, and soda pop.   Lifestyle  changes   If your doctor recommends it, get more exercise. Walking is a good choice. Bit by bit, increase the amount you walk every day. Try for at least 30 minutes on most days of the week. You also may want to swim, bike, or do other activities.    Do not smoke. If you need help quitting, talk to your doctor about stop-smoking programs and medicines. These can increase your chances of quitting for good. Quitting smoking may be the most important step you can take to protect your heart. It is never too late to quit.    Limit alcohol to 2 drinks a day for men and 1 drink a day for women. Too much alcohol can cause health problems.    Manage other health problems such as diabetes, high blood pressure, and high cholesterol. If you think you may have a problem with alcohol or drug use, talk to your doctor.   Medicines   Take your medicines exactly as prescribed. Call your doctor if you think you are having a problem with your medicine.    If your doctor recommends aspirin, take the amount directed each day. Make sure you take aspirin and not another kind of pain reliever, such as acetaminophen (Tylenol).   When should you call for help?   Call 911 if you have symptoms of a heart attack. These may include:   Chest pain or pressure, or a strange feeling in the chest.    Sweating.    Shortness of breath.  Pain, pressure, or a strange feeling in the back, neck, jaw, or upper belly or in one or both shoulders or arms.    Lightheadedness or sudden weakness.    A fast or irregular heartbeat.   After you call 911, the operator may tell you to chew 1 adult-strength or 2 to 4 low-dose aspirin. Wait for an ambulance. Do not try to drive yourself.  Watch closely for changes in your health, and be sure to contact your doctor if you have any problems.  Where can you learn more?  Go to https://www.bennett.info/ and enter F075 to learn more about "A Healthy Heart: Care Instructions."  Current as of: June 25,  2023Content Version: 13.8   2006-2023 Healthwise, Incorporated.   Care instructions adapted under license by [facility]. If you have questions about a medical condition or this instruction, always ask your healthcare professional. [place] any warranty or liability for your use of this information.      Personalized Preventive Plan for Micheal [name] - 10/22/2021  Medicare offers a range of preventive health benefits. Some of the tests and screenings are paid in full while other may be subject to a deductible, co-insurance, and/or copay.    Some of these benefits include a comprehensive review of your medical history including lifestyle, illnesses that may run in your family, and various assessments and screenings as appropriate.    After reviewing your medical record and screening and assessments performed today your provider may have ordered immunizations, labs, imaging, and/or referrals for you.  A list of these orders (if applicable) as well as your Preventive Care list are included within your After Visit Summary for your review.    Other Preventive Recommendations:    A preventive eye exam performed by an eye specialist is recommended every 1-2 years to screen for glaucoma; cataracts, macular degeneration, and other eye disorders.  A preventive dental visit is recommended every 6 months.  Try to get at least 150 minutes of exercise per week or 10,000 steps per day on a pedometer .  Order or download the FREE "Exercise & Physical Activity: Your Everyday Guide" from [employer]. Call [phone] or search [employer] online.  You need 1200-1500 mg of calcium and 1000-2000 IU of vitamin D per day. It is possible to meet your calcium requirement with diet alone, but a vitamin D supplement is usually necessary to meet this goal.  When exposed to the sun, use a sunscreen that protects against both UVA and UVB radiation with an SPF  of 30 or greater. Reapply every 2 to 3 hours or after sweating, drying off with a towel, or swimming.  Always wear a seat belt when traveling in a car. Always wear a helmet when riding a bicycle or motorcycle.

## 2021-10-22 NOTE — Progress Notes (Signed)
Medicare Annual Wellness Visit    Micheal [name] is here for Medicare AWV    Assessment & Plan   Medicare annual wellness visit, subsequent  Breathlessness on exertion  -     DME Order for Home Oxygen as OP  Congestive heart failure, unspecified HF chronicity, unspecified heart failure type [facility])    Patient was evaluated today for the diagnosis of CHF, hypoxia .  I entered a DME order for home oxygen at 2 lpm because the diagnosis and testing require the patient to have supplemental oxygen.  Condition will improve or be benefited by oxygen use.  The patient is  able to perform good mobility in a home setting and therefore does require the use of a portable oxygen system.  The need for this equipment was discussed with the patient and he understands and is in agreement.    Walk test was done last week, PSO2 dropped to 88% about a minute into walk, see encounter from last week for documentation.    Recommendations for Preventive Services Due: see orders and patient instructions/AVS.  Recommended screening schedule for the next 5-10 years is provided to the patient in written form: see Patient Instructions/AVS.     No follow-ups on file.     Subjective   The following acute and/or chronic problems were also addressed today:  DM, HTN, chronic pain        Patient's complete Health Risk Assessment and screening values have been reviewed and are found in Flowsheets. The following problems were reviewed today and where indicated follow up appointments were made and/or referrals ordered.    Positive Risk Factor Screenings with Interventions:    Fall Risk:  Do you feel unsteady or are you worried about falling? : no  2 or more falls in past year?: (!) yes  Fall with injury in past year?: no     Interventions:    See AVS for additional education material     Depression:  Depression Unable to Assess: Functional capacity motivation limits accuracy  PHQ-2 Score: 0  PHQ-9 Total Score: 0    Interpretation:   1-4 = minimal  5-9 =  mild  10-14 = moderate  15-19 = moderately severe  20-27 = severe           Opioid Risk: (Low risk score <55) Opioid risk score: 26    Patient is low risk for opioid use disorder or overdose.  Last PDMP [place] as Reviewed:  Review User Review Instant Review Result   Ellin Mayhew 09/20/2021 12:52 PM     Reviewed PDMP [1]     Last Controlled Substance Monitoring Documentation      Flowsheet Row Refill from 07/31/2018 in [facility]   Periodic Controlled Substance Monitoring Possible medication side effects, risk of tolerance/dependence & alternative treatments discussed., No signs of potential drug abuse or diversion identified., Obtaining appropriate analgesic effect of treatment. filed at 07/31/2018 1018                Weight and Activity:  Physical Activity: Sufficiently Active (10/22/2021)    Exercise Vital Sign     Days of Exercise per Week: 7 days     Minutes of Exercise per Session: 40 min     On average, how many days per week do you engage in moderate to strenuous exercise (like a brisk walk)?: 7 days  Have you lost any weight without trying in the past 3 months?: No  Body mass index is 35.55 kg/m. (!) Abnormal  Obesity Interventions:  See AVS for additional education material          Dentist Screen:  Have you seen the dentist within the past year?: (!) No    Intervention:  See AVS for additional education material    Hearing Screen:  Do you or your family notice any trouble with your hearing that hasn't been managed with hearing aids?: (!) Yes    Interventions:  See AVS for additional education material    Vision Screen:  Do you have difficulty driving, watching TV, or doing any of your daily activities because of your eyesight?: No  Have you had an eye exam within the past year?: (!) No  No results found.    Interventions:   See AVS for additional education material    Safety:  Do you have any tripping hazards - loose or unsecured carpets or rugs?: (!) Yes  Interventions:  See AVS for  additional education material     Advanced Directives:  Do you have a Living Will?: (!) No    Intervention:                         Objective   Vitals:    10/22/21 0842   BP: 124/84   Pulse: 76   Resp: 16   Temp: 98.2 F (36.8 C)   TempSrc: Oral   SpO2: 96%   Weight: 227 lb (103 kg)   Height: '5\' 7"'$  (1.702 m)      Body mass index is 35.55 kg/m.        General Appearance: alert and oriented to person, place and time, well developed and well- nourished, in no acute distress  Skin: warm and dry, no rash or erythema  Head: normocephalic and atraumatic  Eyes: pupils equal, round, and reactive to light, extraocular eye movements intact, conjunctivae normal  ENT: tympanic membrane, external ear and ear canal normal bilaterally, nose without deformity, nasal mucosa and turbinates normal without polyps  Neck: supple and non-tender without mass, no thyromegaly or thyroid nodules, no cervical lymphadenopathy  Pulmonary/Chest: clear to auscultation bilaterally- no wheezes, rales or rhonchi, normal air movement, no respiratory distress  Cardiovascular: normal rate, regular rhythm, normal S1 and S2, no murmurs, rubs, clicks, or gallops, distal pulses intact, no carotid bruits  Abdomen: soft, non-tender, non-distended, normal bowel sounds, no masses or organomegaly  Extremities: no cyanosis, clubbing or edema  Musculoskeletal: normal range of motion, no joint swelling, deformity or tenderness  Neurologic: reflexes normal and symmetric, no cranial nerve deficit, gait, coordination and speech normal       Allergies   Allergen Reactions    Gabapentin      Prior to Visit Medications    Medication Sig Taking? Authorizing Provider   fluticasone (FLONASE) 50 MCG/ACT nasal spray Use 2 spray(s) in each nostril once daily Yes Antony Contras, APRN - CNP   Semaglutide,0.25 or 0.5MG/DOS, (OZEMPIC, 0.25 OR 0.5 MG/DOSE,) 2 MG/1.5ML SOPN Inject 0.25 mg into the skin once a week Yes Antony Contras, APRN - CNP   glimepiride (AMARYL) 4 MG  tablet Take 1 tablet by mouth twice daily Yes Antony Contras, APRN - CNP   blood glucose monitor kit and supplies Dispense sufficient amount for indicated testing frequency plus additional to accommodate PRN testing needs. Dispense all needed supplies to include: monitor, strips, lancing device, lancets, control solutions, alcohol swabs. Yes Antony Contras,  APRN - CNP   blood glucose monitor strips Test 1 times a day & as needed for symptoms of irregular blood glucose. Dispense sufficient amount for indicated testing frequency plus additional to accommodate PRN testing needs. Yes Antony Contras, APRN - CNP   Lancets MISC Check blood sugar q daily, Dx E11.9 Yes Antony Contras, APRN - CNP   hydrOXYzine HCl (ATARAX) 25 MG tablet Take 1 tablet by mouth every 8 hours as needed (pain and spasming, take with pain medication) Yes Antony Contras, APRN - CNP   oxyCODONE-acetaminophen (PERCOCET) 10-325 MG per tablet Take 1 tablet by mouth every 8 hours as needed for Pain for up to 30 days. Yes Antony Contras, APRN - CNP   diazePAM (VALIUM) 5 MG tablet Take 1 tablet by mouth every 8 hours as needed for Anxiety for up to 30 days. Yes Antony Contras, APRN - CNP   esomeprazole (NEXIUM) 40 MG delayed release capsule TAKE 1 CAPSULE BY MOUTH ONCE DAILY IN THE MORNING BEFORE BREAKFAST Yes Enzo Montgomery Pingle, APRN - CNP   pregabalin (LYRICA) 100 MG capsule Take 1 capsule by mouth 3 times daily for 180 days. Max Daily Amount: 300 mg Yes Enzo Montgomery Pingle, APRN - CNP   lisinopril (PRINIVIL;ZESTRIL) 40 MG tablet Take 1 tablet by mouth daily Yes Ellin Mayhew, APRN - CNP   atorvastatin (LIPITOR) 80 MG tablet Take 1 tablet by mouth nightly Yes Zoheir A Abdelbaki, MD   montelukast (SINGULAIR) 10 MG tablet Take 1 tablet by mouth nightly Yes Antony Contras, APRN - CNP   tamsulosin (FLOMAX) 0.4 MG capsule Take 1 capsule by mouth 2 times daily Yes Antony Contras, APRN - CNP   albuterol sulfate HFA  (PROVENTIL;VENTOLIN;PROAIR) 108 (90 Base) MCG/ACT inhaler Inhale 2 puffs into the lungs every 6 hours as needed for Wheezing Yes Antony Contras, APRN - CNP   budesonide-formoterol (SYMBICORT) 80-4.5 MCG/ACT AERO Inhale 2 puffs into the lungs 2 times daily Yes Antony Contras, APRN - CNP   isosorbide mononitrate (IMDUR) 30 MG extended release tablet Take 1 tablet by mouth once daily Yes Kyra Leyland, DO   PARoxetine (PAXIL) 40 MG tablet Take 1 tablet by mouth once daily Yes Kyra Leyland, DO   pioglitazone (ACTOS) 15 MG tablet Take 1 tablet by mouth once daily Yes Mark D Kahle, DO   metFORMIN (GLUCOPHAGE) 1000 MG tablet TAKE 1 TABLET BY MOUTH TWICE DAILY WITH MEALS Yes Kyra Leyland, DO   ticagrelor (BRILINTA) 90 MG TABS tablet Take 1 tablet by mouth twice daily Yes Antony Contras, APRN - CNP   metoprolol succinate (TOPROL XL) 25 MG extended release tablet Take 1 tablet by mouth once daily Yes Kyra Leyland, DO   fenofibrate (TRICOR) 145 MG tablet Take 1 tablet by mouth once daily Yes Kyra Leyland, DO   Handicap Placard MISC by Does not apply route Expires 06/30/25 Yes Mark D Kahle, DO   olopatadine (PATANOL) 0.1 % ophthalmic solution Place 1 drop into both eyes 2 times daily Yes Celine Ahr Kahle, DO   hydrocortisone 2.5 % cream Apply to external ear BID PRN Yes Mark D Kahle, DO   NARCAN 4 MG/0.1ML LIQD nasal spray ADMINISTER A SINGLE SPRAY INTRANASALLY INTO ONE NOSTRIL. CALL 911. MAY REPEAT X 1. Yes Historical Provider, MD   nitroGLYCERIN (NITROSTAT) 0.4 MG SL tablet Place 1 tablet under the tongue every 5 minutes as needed for Chest pain Yes  Zoheir Gavin Potters, MD   Multiple Vitamins-Minerals (CENTRUM SILVER ULTRA MENS) TABS Take 1 tablet by mouth daily Yes Historical Provider, MD       CareTeam (Including outside providers/suppliers regularly involved in providing care):   Patient Care Team:  Antony Contras, APRN - CNP as PCP - General (Nurse Practitioner Family)  Antony Contras, APRN - CNP as PCP -  Empaneled Provider     Reviewed and updated this visit:  Tobacco  Allergies  Meds  Med Hx  Surg Hx  Soc Hx  Fam Hx

## 2021-10-29 ENCOUNTER — Telehealth

## 2021-10-29 ENCOUNTER — Encounter

## 2021-10-29 MED ORDER — ONDANSETRON 4 MG PO TBDP
4 MG | ORAL_TABLET | Freq: Three times a day (TID) | ORAL | 0 refills | Status: AC | PRN
Start: 2021-10-29 — End: 2022-04-04

## 2021-10-29 MED ORDER — DIAZEPAM 5 MG PO TABS
5 MG | ORAL_TABLET | Freq: Three times a day (TID) | ORAL | 0 refills | Status: AC | PRN
Start: 2021-10-29 — End: 2021-11-28

## 2021-10-29 MED ORDER — OXYCODONE-ACETAMINOPHEN 10-325 MG PO TABS
10-325 MG | ORAL_TABLET | Freq: Three times a day (TID) | ORAL | 0 refills | Status: AC | PRN
Start: 2021-10-29 — End: 2021-11-28

## 2021-10-29 NOTE — Telephone Encounter (Signed)
Zofran sent  If doesn't improve or actually gets worse, then let us know or get checked out.

## 2021-10-29 NOTE — Telephone Encounter (Signed)
Future Appointments   Date Time Provider [place]   12/15/2021 11:15 AM Veverly Fells, MD [facility] [place]   01/14/2022 10:00 AM Simpson, [place]

## 2021-10-29 NOTE — Telephone Encounter (Signed)
Patient called in stating that the [place] makes him nauseous and is asking for medication to be sent in to help with it      He uses Product/process development scientist on [address]

## 2021-11-01 NOTE — Telephone Encounter (Signed)
Patient informed, verbally understood.

## 2021-11-02 ENCOUNTER — Encounter

## 2021-11-02 MED ORDER — FENOFIBRATE 145 MG PO TABS
145 MG | ORAL_TABLET | Freq: Every day | ORAL | 3 refills | Status: AC
Start: 2021-11-02 — End: ?

## 2021-11-02 NOTE — Telephone Encounter (Signed)
Recent Visits  Date Type Provider Dept   10/22/21 Office Visit Antony Contras, APRN - CNP [facility]   10/12/21 Office Visit Antony Contras, APRN - CNP [facility]   03/30/21 Office Visit Antony Contras, APRN - CNP [facility]   07/13/20 Office Visit Kyra Leyland, DO [facility] [facility]   Showing recent visits within past 540 days with a meds authorizing provider and meeting all other requirements  Future Appointments  Date Type Provider Dept   01/14/22 Appointment Antony Contras, APRN - CNP [facility]   Showing future appointments within next 150 days with a meds authorizing provider and meeting all other requirements

## 2021-11-16 ENCOUNTER — Inpatient Hospital Stay: Payer: MEDICARE | Primary: Family

## 2021-11-16 DIAGNOSIS — Z13220 Encounter for screening for lipoid disorders: Secondary | ICD-10-CM

## 2021-11-16 LAB — CBC WITH AUTO DIFFERENTIAL
Basophils Absolute: 0 10*3/uL (ref 0.0–0.1)
Basophils: 0.5 %
Eosinophils Absolute: 0.1 10*3/uL (ref 0.0–0.4)
Eosinophils: 1.6 %
Hematocrit: 43.3 % (ref 42.0–52.0)
Hemoglobin: 13.6 gm/dl — ABNORMAL LOW (ref 14.0–18.0)
Immature Grans (Abs): 0.05 10*3/uL (ref 0.00–0.07)
Immature Granulocytes: 0.7 %
Lymphocytes Absolute: 2.3 10*3/uL (ref 1.0–4.8)
Lymphocytes: 30.4 %
MCH: 27.5 pg (ref 26.0–33.0)
MCHC: 31.4 gm/dl — ABNORMAL LOW (ref 32.2–35.5)
MCV: 87.5 fL (ref 80.0–94.0)
MPV: 11.2 fL (ref 9.4–12.4)
Monocytes Absolute: 0.6 10*3/uL (ref 0.4–1.3)
Monocytes: 7.7 %
Platelets: 271 10*3/uL (ref 130–400)
RBC: 4.95 10*6/uL (ref 4.70–6.10)
RDW-CV: 14.5 % (ref 11.5–14.5)
RDW-SD: 46.5 fL — ABNORMAL HIGH (ref 35.0–45.0)
Seg Neutrophils: 59.1 %
Segs Absolute: 4.4 10*3/uL (ref 1.8–7.7)
WBC: 7.5 10*3/uL (ref 4.8–10.8)
nRBC: 0 /100 wbc

## 2021-11-16 LAB — LIPID PANEL
Cholesterol, Total: 197 mg/dL (ref 100–199)
HDL: 36 mg/dL
LDL Calculated: 127 mg/dL
Triglycerides: 171 mg/dL (ref 0–199)

## 2021-11-16 LAB — COMPREHENSIVE METABOLIC PANEL
ALT: 53 U/L (ref 11–66)
AST: 29 U/L (ref 5–40)
Albumin: 4.4 g/dL (ref 3.5–5.1)
Alkaline Phosphatase: 38 U/L (ref 38–126)
BUN: 24 mg/dL — ABNORMAL HIGH (ref 7–22)
CO2: 23 meq/L (ref 23–33)
Calcium: 9.8 mg/dL (ref 8.5–10.5)
Chloride: 100 meq/L (ref 98–111)
Creatinine: 1.1 mg/dL (ref 0.4–1.2)
Glucose: 166 mg/dL — ABNORMAL HIGH (ref 70–108)
Potassium: 4.3 meq/L (ref 3.5–5.2)
Sodium: 138 meq/L (ref 135–145)
Total Bilirubin: 0.5 mg/dL (ref 0.3–1.2)
Total Protein: 7.9 g/dL (ref 6.1–8.0)

## 2021-11-16 LAB — GLOMERULAR FILTRATION RATE, ESTIMATED: Est, Glom Filt Rate: >60 mL/min/{1.73_m2} (ref >60)

## 2021-11-16 LAB — TSH: TSH: 2.29 u[IU]/mL (ref 0.400–4.200)

## 2021-11-16 LAB — ANION GAP: Anion Gap: 15 meq/L (ref 8.0–16.0)

## 2021-11-16 LAB — T4, FREE: T4 Free: 1.41 ng/dL (ref 0.93–1.76)

## 2021-11-17 ENCOUNTER — Encounter

## 2021-11-17 MED ORDER — HYDROCORTISONE 2.5 % EX CREA
2.5 % | CUTANEOUS | 3 refills | Status: AC
Start: 2021-11-17 — End: ?

## 2021-11-17 NOTE — Telephone Encounter (Signed)
Fax from [address] received for the following   Please approve or refuse Rx request:  Requested Prescriptions     Pending Prescriptions Disp Refills    hydrocortisone 2.5 % cream 3 each 3     Sig: Apply to external ear BID PRN       Next appointment:  01/14/2022

## 2021-11-18 NOTE — Telephone Encounter (Signed)
Left message on answering machine requesting pt to call back at earliest convenience.

## 2021-11-18 NOTE — Telephone Encounter (Signed)
-----   Message from Antony Contras, APRN - CNP sent at 11/17/2021  9:50 PM EDT -----  Labs were stable except LDL (bad fat) was a little elevated.  I recommend following a low fat diet, exercise at least 30 min/day 3-5 days a week, try to include foods rich in Omega-3 fatty acids (salmon, walnuts, flaxseed, etc) and soluble fiber (oatmeal, kidney beans, apples, etc) in your diet.  Continue Atorvastatin nightly.

## 2021-11-18 NOTE — Telephone Encounter (Signed)
Patient informed, verbally understood.

## 2021-11-25 ENCOUNTER — Encounter

## 2021-11-25 MED ORDER — DIAZEPAM 5 MG PO TABS
5 MG | ORAL_TABLET | Freq: Three times a day (TID) | ORAL | 0 refills | Status: DC | PRN
Start: 2021-11-25 — End: 2021-12-23

## 2021-11-25 MED ORDER — OXYCODONE-ACETAMINOPHEN 10-325 MG PO TABS
10-325 MG | ORAL_TABLET | Freq: Three times a day (TID) | ORAL | 0 refills | Status: DC | PRN
Start: 2021-11-25 — End: 2021-12-23

## 2021-12-07 ENCOUNTER — Encounter

## 2021-12-07 MED ORDER — ISOSORBIDE MONONITRATE ER 30 MG PO TB24
30 MG | ORAL_TABLET | Freq: Every day | ORAL | 3 refills | Status: AC
Start: 2021-12-07 — End: 2022-12-07

## 2021-12-07 MED ORDER — METOPROLOL SUCCINATE ER 25 MG PO TB24
25 MG | ORAL_TABLET | Freq: Every day | ORAL | 3 refills | Status: AC
Start: 2021-12-07 — End: 2022-11-24

## 2021-12-07 MED ORDER — FLUTICASONE PROPIONATE 50 MCG/ACT NA SUSP
50 MCG/ACT | NASAL | 0 refills | Status: DC
Start: 2021-12-07 — End: 2022-01-20

## 2021-12-07 NOTE — Telephone Encounter (Signed)
Recent Visits  Date Type Provider Dept   10/22/21 Office Visit Antony Contras, APRN - CNP [facility]   10/12/21 Office Visit Antony Contras, APRN - CNP [facility]   03/30/21 Office Visit Antony Contras, APRN - CNP [facility]   07/13/20 Office Visit Kyra Leyland, DO [facility] [facility]   Showing recent visits within past 540 days with a meds authorizing provider and meeting all other requirements  Future Appointments  Date Type Provider Dept   01/14/22 Appointment Antony Contras, APRN - CNP [facility]   Showing future appointments within next 150 days with a meds authorizing provider and meeting all other requirements

## 2021-12-11 ENCOUNTER — Encounter

## 2021-12-13 MED ORDER — TICAGRELOR 90 MG PO TABS
90 MG | ORAL_TABLET | ORAL | 3 refills | Status: AC
Start: 2021-12-13 — End: 2022-10-31

## 2021-12-13 NOTE — Telephone Encounter (Signed)
Recent Visits  Date Type Provider Dept   10/22/21 Office Visit Simpson, Jennifer E, APRN - CNP [facility]   10/12/21 Office Visit Simpson, Jennifer E, APRN - CNP [facility]   03/30/21 Office Visit Simpson, Jennifer E, APRN - CNP [facility]   07/13/20 Office Visit Kahle, Mark D, DO [facility]   Showing recent visits within past 540 days with a meds authorizing provider and meeting all other requirements  Future Appointments  Date Type Provider Dept   01/14/22 Appointment Simpson, Jennifer E, APRN - CNP [facility]   Showing future appointments within next 150 days with a meds authorizing provider and meeting all other requirements

## 2021-12-15 ENCOUNTER — Ambulatory Visit: Admit: 2021-12-15 | Discharge: 2021-12-15 | Payer: MEDICARE | Attending: Cardiovascular Disease | Primary: Family

## 2021-12-15 DIAGNOSIS — I25810 Atherosclerosis of coronary artery bypass graft(s) without angina pectoris: Secondary | ICD-10-CM

## 2021-12-15 MED ORDER — ROSUVASTATIN CALCIUM 40 MG PO TABS
40 MG | ORAL_TABLET | Freq: Every evening | ORAL | 3 refills | Status: DC
Start: 2021-12-15 — End: 2021-12-21

## 2021-12-15 NOTE — Progress Notes (Signed)
[address]  [address]  [place] [address]  Dept: [phone]  Dept Fax: [phone]  Loc: [phone]    Visit Date: 12/15/2021    Micheal [name] is a 66 y.o. male who presents todayfor:  Chief Complaint   Patient presents with    6 Month Follow-Up    Coronary Artery Disease    Check-Up    Hypertension    Hyperlipidemia   Known CABG and stents  No chest pain  No changes in breathing  Some limitation   No dizziness  No syncope  BP is stable  On medical Rx   On statins for hyperlipidemia  No issues with the meds  Higher risk patient  BS is fair   Still higher cholesterol       HPI:  HPI  Past Medical History:   Diagnosis Date    Arthritis     general    CAD (coronary artery disease)     s/p stent placement-sees Dr. Irene Limbo    Chronic back pain     Diabetes mellitus ([place])     GERD (gastroesophageal reflux disease)     Hyperlipidemia     Hypertension       Past Surgical History:   Procedure Laterality Date    COLONOSCOPY  2015    [place], [place]    COLONOSCOPY Left 12/19/2017    COLONOSCOPY POLYPECTOMY HOT BIOPSY performed by Earmon Phoenix, MD at [facility]    CORONARY ARTERY BYPASS GRAFT  2009    6 vessel-[place] CATH LAB PROCEDURE      ORTHOPEDIC SURGERY Left     PTCA  07/04/2016    TONSILLECTOMY       Family History   Problem Relation Age of Onset    Cancer Mother     Heart Disease Brother     Stroke Brother     Diabetes Brother     Early Death Brother     High Blood Pressure Brother     High Cholesterol Brother     Mental Retardation Sister      Social History     Tobacco Use    Smoking status: Former     Packs/day: 1.00     Years: 15.00     Additional pack years: 0.00     Total pack years: 15.00     Types: Cigarettes     Quit date: 05/24/2008     Years since quitting: 13.5    Smokeless tobacco: Never   Substance Use Topics    Alcohol use: Yes     Alcohol/week: 1.0 standard drink of alcohol      Types: 1 Glasses of wine per week     Comment: once monthly stopped a over 2 years      Current Outpatient Medications   Medication Sig Dispense Refill    ticagrelor (BRILINTA) 90 MG TABS tablet Take 1 tablet by mouth twice daily 180 tablet 3    fluticasone (FLONASE) 50 MCG/ACT nasal spray Use 2 spray(s) in each nostril once daily 16 g 0    metoprolol succinate (TOPROL XL) 25 MG extended release tablet Take 1 tablet by mouth daily 90 tablet 3    isosorbide mononitrate (IMDUR) 30 MG extended release tablet Take 1 tablet by mouth daily 90 tablet 3    diazePAM (VALIUM) 5 MG tablet Take 1 tablet by  mouth every 8 hours as needed for Anxiety for up to 30 days. 90 tablet 0    oxyCODONE-acetaminophen (PERCOCET) 10-325 MG per tablet Take 1 tablet by mouth every 8 hours as needed for Pain for up to 30 days. 90 tablet 0    hydrocortisone 2.5 % cream Apply to external ear BID PRN 3 each 3    fenofibrate (TRICOR) 145 MG tablet Take 1 tablet by mouth daily 90 tablet 3    ondansetron (ZOFRAN-ODT) 4 MG disintegrating tablet Take 1 tablet by mouth 3 times daily as needed for Nausea or Vomiting 21 tablet 0    Semaglutide,0.25 or 0.5MG/DOS, (OZEMPIC, 0.25 OR 0.5 MG/DOSE,) 2 MG/1.5ML SOPN Inject 0.25 mg into the skin once a week 1.5 mL 1    glimepiride (AMARYL) 4 MG tablet Take 1 tablet by mouth twice daily 180 tablet 3    blood glucose monitor kit and supplies Dispense sufficient amount for indicated testing frequency plus additional to accommodate PRN testing needs. Dispense all needed supplies to include: monitor, strips, lancing device, lancets, control solutions, alcohol swabs. 1 kit 0    blood glucose monitor strips Test 1 times a day & as needed for symptoms of irregular blood glucose. Dispense sufficient amount for indicated testing frequency plus additional to accommodate PRN testing needs. 300 strip 0    Lancets MISC Check blood sugar q daily, Dx E11.9 100 each 2    hydrOXYzine HCl (ATARAX) 25 MG tablet Take 1 tablet by mouth  every 8 hours as needed (pain and spasming, take with pain medication) 90 tablet 1    esomeprazole (NEXIUM) 40 MG delayed release capsule TAKE 1 CAPSULE BY MOUTH ONCE DAILY IN THE MORNING BEFORE BREAKFAST 90 capsule 3    pregabalin (LYRICA) 100 MG capsule Take 1 capsule by mouth 3 times daily for 180 days. Max Daily Amount: 300 mg 90 capsule 5    lisinopril (PRINIVIL;ZESTRIL) 40 MG tablet Take 1 tablet by mouth daily 90 tablet 3    atorvastatin (LIPITOR) 80 MG tablet Take 1 tablet by mouth nightly 90 tablet 1    montelukast (SINGULAIR) 10 MG tablet Take 1 tablet by mouth nightly 90 tablet 3    tamsulosin (FLOMAX) 0.4 MG capsule Take 1 capsule by mouth 2 times daily 180 capsule 3    albuterol sulfate HFA (PROVENTIL;VENTOLIN;PROAIR) 108 (90 Base) MCG/ACT inhaler Inhale 2 puffs into the lungs every 6 hours as needed for Wheezing 18 g 3    budesonide-formoterol (SYMBICORT) 80-4.5 MCG/ACT AERO Inhale 2 puffs into the lungs 2 times daily 10.2 g 3    PARoxetine (PAXIL) 40 MG tablet Take 1 tablet by mouth once daily 90 tablet 3    pioglitazone (ACTOS) 15 MG tablet Take 1 tablet by mouth once daily 90 tablet 3    metFORMIN (GLUCOPHAGE) 1000 MG tablet TAKE 1 TABLET BY MOUTH TWICE DAILY WITH MEALS 180 tablet 3    Handicap Placard MISC by Does not apply route Expires 06/30/25 1 each 0    olopatadine (PATANOL) 0.1 % ophthalmic solution Place 1 drop into both eyes 2 times daily 3 each 3    NARCAN 4 MG/0.1ML LIQD nasal spray ADMINISTER A SINGLE SPRAY INTRANASALLY INTO ONE NOSTRIL. CALL 911. MAY REPEAT X 1.      nitroGLYCERIN (NITROSTAT) 0.4 MG SL tablet Place 1 tablet under the tongue every 5 minutes as needed for Chest pain 25 tablet 2    Multiple Vitamins-Minerals (CENTRUM SILVER ULTRA MENS) TABS Take 1 tablet by  mouth daily       No current facility-administered medications for this visit.     Allergies   Allergen Reactions    Gabapentin      Health Maintenance   Topic Date Due    Diabetic retinal exam  Never done    DTaP/Tdap/Td  vaccine (1 - Tdap) Never done    AAA screen  Never done    Diabetic Alb to Cr ratio (uACR) test  10/13/2022 (Originally 03/04/2020)    Hepatitis B vaccine (1 of 3 - Risk 3-dose series) 10/23/2022 (Originally 06/12/2014)    COVID-19 Vaccine (6 - 2023-24 season) 10/13/2026 (Originally 09/24/2021)    Diabetic foot exam  10/13/2022    A1C test (Diabetic or Prediabetic)  10/13/2022    Depression Screen  10/23/2022    Annual Wellness Visit (AWV)  10/23/2022    Lipids  11/17/2022    GFR test (Diabetes, CKD 3-4, OR last GFR 15-59)  11/17/2022    Colorectal Cancer Screen  12/20/2022    Flu vaccine  Completed    Shingles vaccine  Completed    Pneumococcal 65+ years Vaccine  Completed    Hepatitis C screen  Completed    Hepatitis A vaccine  Aged Out    Hib vaccine  Aged Out    Meningococcal (ACWY) vaccine  Aged Out    Pneumococcal 0-64 years Vaccine  Discontinued    HIV screen  Discontinued    Prostate Specific Antigen (PSA) Screening or Monitoring  Discontinued       Subjective:  General:   No fever, no chills, some fatigue or weight loss  Pulmonary:    some dyspnea, no wheezing  Cardiac:    Denies recent chest pain,   GI:     No nausea or vomiting, no abdominal pain  Neuro:    No dizziness or light headedness,   Musculoskeletal:  No recent active issues  Extremities:   No edema, no obvious claudication       Objective:  General:   Well developed, well nourished  Lungs:   Clear to auscultation  Heart:    Normal S1 S2, Slight murmur. no rubs, no gallops  Abdomen:   Soft, non tender, no organomegalies, positive bowel sounds  Extremities:   No edema, no cyanosis, good peripheral pulses  Neurological:   Awake, alert, oriented. No obvious focal deficits  Musculoskelatal:  No obvious deformities   BP 98/68   Pulse 82   Ht 1.702 m (5' 7")   Wt 96.2 kg (212 lb)   BMI 33.20 kg/m     Assessment:      Diagnosis Orders   1. Coronary artery disease involving coronary bypass graft of native heart without angina pectoris        2. Primary  hypertension        3. Familial hypercholesterolemia        As above  Higher risk for CAD  Higher cholesterol   Max statin dose       Plan:  No follow-ups on file.  As above  Change to crestor 40 mg   Consider repatha if the numbers are not better  Continue risk factor modification and medical management  Thank you for allowing me to participate in the care of your patient. Please don't hesitate to contact me regarding any further issues related to the patient care    Orders Placed:  No orders of the defined types were placed in this encounter.  Prescribed:  No orders of the defined types were placed in this encounter.         Discussed use, benefit, and side effects of prescribed medications. All patient questions answered. Pt voicedunderstanding. Instructed to continue current medications, diet and exercise. Continue risk factor modification and medical management. Patient agreed with treatment plan. Follow up as directed.    Electronically signedby Veverly Fells, MD on 12/15/2021 at 11:18 AM

## 2021-12-20 NOTE — Telephone Encounter (Signed)
Okay. Go back to lipitor

## 2021-12-20 NOTE — Telephone Encounter (Signed)
Pt states his blood pressure is dropping after taking ozempic  Took shot on Fri , he was  dizzy, weak   Fri  b/p 72/40     Sat am 112/85      Today @ 1:45  117/68

## 2021-12-20 NOTE — Telephone Encounter (Signed)
Pt calling, is not tolerating the Crestor. Was switched from Lipitor. Terrible body and muscle aches. He is stopping it.  Asking for recommendations.

## 2021-12-20 NOTE — Telephone Encounter (Signed)
How are your blood sugars? Are you staying hydrated, drinking plenty of fluids?

## 2021-12-21 MED ORDER — ATORVASTATIN CALCIUM 80 MG PO TABS
80 MG | ORAL_TABLET | Freq: Every day | ORAL | 3 refills | Status: DC
Start: 2021-12-21 — End: 2022-11-21

## 2021-12-21 NOTE — Telephone Encounter (Signed)
Continue to drink plenty of water  Monitor BP and sugars closely  Is he having any diarrhea since starting the Ozempic?

## 2021-12-21 NOTE — Telephone Encounter (Signed)
Spoke to patient, notified.  [place] updated.   Rx pended. He threw the previous prescription away.

## 2021-12-21 NOTE — Telephone Encounter (Signed)
Pt informed and verbalized understanding.   Pt has not had any diarrhea

## 2021-12-21 NOTE — Telephone Encounter (Signed)
Pt's blood sugar 148 has been running high  He stays hydrated, drinking plenty fluids

## 2021-12-23 ENCOUNTER — Encounter

## 2021-12-23 MED ORDER — OXYCODONE-ACETAMINOPHEN 10-325 MG PO TABS
10-325 MG | ORAL_TABLET | Freq: Three times a day (TID) | ORAL | 0 refills | Status: AC | PRN
Start: 2021-12-23 — End: 2022-01-22

## 2021-12-23 MED ORDER — DIAZEPAM 5 MG PO TABS
5 MG | ORAL_TABLET | Freq: Three times a day (TID) | ORAL | 0 refills | Status: DC | PRN
Start: 2021-12-23 — End: 2022-01-20

## 2021-12-29 NOTE — Telephone Encounter (Signed)
Patient came into office to make a appointment to see Arnoldo Hooker due to blacking out. I check her schedule and nothing was available until the following day. I went and spoke with Arnoldo Hooker about the patient and she stated" that the Patient should go to ER or Urgent care immediatly. For labs and imaging since we don't do that in office  "  I went and and told the patient and he stated " no the hospital will keep me and I refuse to go" then left the office.

## 2021-12-29 NOTE — Telephone Encounter (Signed)
Noted, thank you

## 2022-01-03 ENCOUNTER — Inpatient Hospital Stay: Payer: MEDICARE | Primary: Family

## 2022-01-03 ENCOUNTER — Encounter

## 2022-01-03 DIAGNOSIS — I25810 Atherosclerosis of coronary artery bypass graft(s) without angina pectoris: Secondary | ICD-10-CM

## 2022-01-03 LAB — HEPATIC FUNCTION PANEL
ALT: 34 U/L (ref 11–66)
AST: 24 U/L (ref 5–40)
Albumin: 5 g/dL (ref 3.5–5.1)
Alkaline Phosphatase: 38 U/L (ref 38–126)
Bilirubin, Direct: <0.2 mg/dL (ref 0.0–0.3)
Total Bilirubin: 0.4 mg/dL (ref 0.3–1.2)
Total Protein: 8.5 g/dL — ABNORMAL HIGH (ref 6.1–8.0)

## 2022-01-03 LAB — LIPID, FASTING
Cholesterol, Fasting: 155 mg/dl (ref 100–199)
HDL: 35 mg/dL
LDL Calculated: 88 mg/dL
Triglyceride, Fasting: 159 mg/dl (ref 0–199)

## 2022-01-03 MED ORDER — OZEMPIC (0.25 OR 0.5 MG/DOSE) 2 MG/1.5ML SC SOPN
2 MG/1.5ML | SUBCUTANEOUS | 1 refills | Status: DC
Start: 2022-01-03 — End: 2022-01-28

## 2022-01-03 MED ORDER — METFORMIN HCL 1000 MG PO TABS
1000 MG | ORAL_TABLET | Freq: Two times a day (BID) | ORAL | 3 refills | Status: AC
Start: 2022-01-03 — End: 2022-12-07

## 2022-01-14 ENCOUNTER — Encounter: Payer: MEDICARE | Attending: Family | Primary: Family

## 2022-01-20 ENCOUNTER — Encounter

## 2022-01-20 MED ORDER — OXYCODONE-ACETAMINOPHEN 10-325 MG PO TABS
10-325 MG | ORAL_TABLET | Freq: Three times a day (TID) | ORAL | 0 refills | Status: DC | PRN
Start: 2022-01-20 — End: 2022-02-17

## 2022-01-20 MED ORDER — FLUTICASONE PROPIONATE 50 MCG/ACT NA SUSP
50 MCG/ACT | NASAL | 3 refills | Status: DC
Start: 2022-01-20 — End: 2022-08-22

## 2022-01-20 MED ORDER — DIAZEPAM 5 MG PO TABS
5 MG | ORAL_TABLET | Freq: Three times a day (TID) | ORAL | 0 refills | Status: DC | PRN
Start: 2022-01-20 — End: 2022-02-17

## 2022-01-20 NOTE — Telephone Encounter (Signed)
Recent Visits  Date Type Provider Dept   10/22/21 Office Visit Simpson, Jennifer E, APRN - CNP [facility]   10/12/21 Office Visit Simpson, Jennifer E, APRN - CNP [facility]   03/30/21 Office Visit Simpson, Jennifer E, APRN - CNP [facility]   Showing recent visits within past 540 days with a meds authorizing provider and meeting all other requirements  Future Appointments  Date Type Provider Dept   01/26/22 Appointment Simpson, Jennifer E, APRN - CNP [facility]   Showing future appointments within next 150 days with a meds authorizing provider and meeting all other requirements

## 2022-01-23 ENCOUNTER — Encounter

## 2022-01-25 MED ORDER — HYDROXYZINE HCL 25 MG PO TABS
25 MG | ORAL_TABLET | ORAL | 0 refills | Status: DC
Start: 2022-01-25 — End: 2022-03-28

## 2022-01-25 NOTE — Telephone Encounter (Signed)
Recent Visits  Date Type Provider Dept   10/22/21 Office Visit Antony Contras, APRN - CNP [facility]   10/12/21 Office Visit Antony Contras, APRN - CNP [facility]   03/30/21 Office Visit Antony Contras, APRN - CNP [facility]   Showing recent visits within past 540 days with a meds authorizing provider and meeting all other requirements  Future Appointments  Date Type Provider Dept   01/26/22 Appointment Antony Contras, APRN - CNP [facility]   Showing future appointments within next 150 days with a meds authorizing provider and meeting all other requirements

## 2022-01-26 ENCOUNTER — Encounter: Payer: MEDICARE | Attending: Family | Primary: Family

## 2022-01-28 ENCOUNTER — Encounter: Admit: 2022-01-28 | Discharge: 2022-01-28 | Payer: MEDICARE | Attending: Family | Primary: Family

## 2022-01-28 DIAGNOSIS — E119 Type 2 diabetes mellitus without complications: Secondary | ICD-10-CM

## 2022-01-28 DIAGNOSIS — J302 Other seasonal allergic rhinitis: Secondary | ICD-10-CM

## 2022-01-28 LAB — POCT GLYCOSYLATED HEMOGLOBIN (HGB A1C): Hemoglobin A1C: 7.2 % — ABNORMAL HIGH (ref 4.3–5.7)

## 2022-01-28 MED ORDER — CETIRIZINE HCL 10 MG PO TABS
10 MG | ORAL_TABLET | Freq: Every morning | ORAL | 3 refills | Status: DC
Start: 2022-01-28 — End: 2023-02-20

## 2022-01-28 MED ORDER — ALBUTEROL SULFATE HFA 108 (90 BASE) MCG/ACT IN AERS
108 (90 Base) MCG/ACT | Freq: Four times a day (QID) | RESPIRATORY_TRACT | 3 refills | Status: DC | PRN
Start: 2022-01-28 — End: 2023-07-11

## 2022-01-28 MED ORDER — DOXYCYCLINE HYCLATE 100 MG PO TABS
100 MG | ORAL_TABLET | Freq: Two times a day (BID) | ORAL | 0 refills | Status: AC
Start: 2022-01-28 — End: 2022-02-04

## 2022-01-28 MED ORDER — BUDESONIDE-FORMOTEROL FUMARATE 80-4.5 MCG/ACT IN AERO
Freq: Two times a day (BID) | RESPIRATORY_TRACT | 3 refills | Status: AC
Start: 2022-01-28 — End: 2022-05-20

## 2022-01-28 MED ORDER — PREDNISONE 20 MG PO TABS
20 MG | ORAL_TABLET | Freq: Every day | ORAL | 0 refills | Status: AC
Start: 2022-01-28 — End: 2022-02-02

## 2022-01-28 MED ORDER — GLIMEPIRIDE 2 MG PO TABS
2 MG | ORAL_TABLET | ORAL | 3 refills | Status: AC
Start: 2022-01-28 — End: ?

## 2022-01-28 MED ORDER — OZEMPIC (0.25 OR 0.5 MG/DOSE) 2 MG/1.5ML SC SOPN
2 MG/1.5ML | SUBCUTANEOUS | 3 refills | Status: AC
Start: 2022-01-28 — End: 2022-05-09

## 2022-01-28 NOTE — Patient Instructions (Signed)
Beginning tomorrow, take Lisinopril and Isosorbide in the morning and take Metoprolol in the evening.

## 2022-01-28 NOTE — Progress Notes (Signed)
Micheal [name] is a 67 y.o. male for Follow-up (Diabetes)      Diabetes Type 2    Glucose control:   Does patient check blood glucoses at home?  Yes  Report of hypoglycemia: no  Lab Results   Component Value Date    LABA1C 7.2 (H) 01/28/2022     No results found for: "EAG"    Symptoms  Polyuria, Polydipsia or Polyphagia?   No  Chest Pain, SOB, or Palpitations? -  Yes - sob, but at baseline  New Vision complaints? No  Paresthesias of the extremities?  No    Medications  Current medication were reviewed.  Compliant with medications?  yes  Medication side effects?  No  On ACE-I or ARB?  Yes  On antiplatelet therapy?  Yes  On Statin?  On Fenofibrate    Last Diabetic Eye Exam: yes    Exercise  Exercise? Tries to be active but sometimes gets sob with exertion  Wt Readings from Last 3 Encounters:   01/28/22 96.6 kg (213 lb)   12/15/21 96.2 kg (212 lb)   10/22/21 103 kg (227 lb)       Diet discipline?:  Low salt, fat, sugar diet?  Portion control, limiting carbs    Blood pressure control:  BP Readings from Last 3 Encounters:   01/28/22 122/78   12/15/21 98/68   10/22/21 124/84     Sinus congestion  "Had for awhile now"  Couple nosebleeds  Cough sometimes  No wheezing, cp, fever, chills, n,v,d        No results found for: "MALB24HUR"    Lab Results   Component Value Date    LDLCALC 88 01/03/2022       Physical Exam    BP 122/78   Pulse 79   Temp 98.1 F (36.7 C) (Oral)   Resp 14   Ht 1.702 m (5\' 7" )   Wt 96.6 kg (213 lb)   SpO2 93%   BMI 33.36 kg/m   Appearance: alert, well appearing, and in no distress, normal appearing weight and well hydrated.  Neck exam - supple, no significant adenopathy.  CVS exam: normal rate, regular rhythm, normal S1, S2, no murmurs, rubs, clicks or gallops.  Lungs: clear to auscultation, no wheezes, rales or rhonchi, symmetric air entry.  Abdomen:  BS normal, soft, non tender, non distended, no rebound or guarding  Mental Status: normal mood, affect behavior, speech, dress,  and thought  processes.  Neuro:  Alert, muscle tone grossly normal  Skin exam: normal coloration and turgor, no rashes, no suspicious skin lesions noted.  Foot exam:  No pedal edema, no erythematous lesions noted b/l, sensation wnl b/l, PT and TP pulses wnl b/l    ASSESSMENT & PLAN  Micheal [name] was seen today for follow-up.    Diagnoses and all orders for this visit:    Type 2 diabetes mellitus without complication, without long-term current use of insulin (HCC)  -     glimepiride (AMARYL) 2 MG tablet; Take 1 tablet by mouth twice daily  -     Semaglutide,0.25 or 0.5MG /DOS, (OZEMPIC, 0.25 OR 0.5 MG/DOSE,) 2 MG/1.5ML SOPN; Inject 0.5 mg into the skin once a week    Wheezing  -     albuterol sulfate HFA (PROVENTIL;VENTOLIN;PROAIR) 108 (90 Base) MCG/ACT inhaler; Inhale 2 puffs into the lungs every 6 hours as needed for Wheezing  -     budesonide-formoterol (SYMBICORT) 80-4.5 MCG/ACT AERO; Inhale 2 puffs into the lungs 2 times  daily    History of asthma  -     albuterol sulfate HFA (PROVENTIL;VENTOLIN;PROAIR) 108 (90 Base) MCG/ACT inhaler; Inhale 2 puffs into the lungs every 6 hours as needed for Wheezing  -     budesonide-formoterol (SYMBICORT) 80-4.5 MCG/ACT AERO; Inhale 2 puffs into the lungs 2 times daily    Seasonal allergies  -     cetirizine (ZYRTEC) 10 MG tablet; Take 1 tablet by mouth every morning    Essential hypertension  -     glimepiride (AMARYL) 2 MG tablet; Take 1 tablet by mouth twice daily    Acute bacterial rhinosinusitis  -     doxycycline hyclate (VIBRA-TABS) 100 MG tablet; Take 1 tablet by mouth 2 times daily for 7 days  -     predniSONE (DELTASONE) 20 MG tablet; Take 1 tablet by mouth daily for 5 days    Fluid level behind tympanic membrane of both ears  -     doxycycline hyclate (VIBRA-TABS) 100 MG tablet; Take 1 tablet by mouth 2 times daily for 7 days  -     predniSONE (DELTASONE) 20 MG tablet; Take 1 tablet by mouth daily for 5 days    Other orders  -     POCT glycosylated hemoglobin (Hb A1C)      Discussed diet  changes  Increased Semaglutide to 0.5 mg weekly    Return in about 6 months (around 07/29/2022).

## 2022-02-02 MED ORDER — OLOPATADINE HCL 0.1 % OP SOLN
0.1 % | Freq: Two times a day (BID) | OPHTHALMIC | 3 refills | Status: AC
Start: 2022-02-02 — End: 2022-12-07

## 2022-02-17 ENCOUNTER — Encounter

## 2022-02-17 MED ORDER — DIAZEPAM 5 MG PO TABS
5 MG | ORAL_TABLET | Freq: Three times a day (TID) | ORAL | 2 refills | Status: AC | PRN
Start: 2022-02-17 — End: 2022-05-18

## 2022-02-17 MED ORDER — OXYCODONE-ACETAMINOPHEN 10-325 MG PO TABS
10-325 MG | ORAL_TABLET | Freq: Three times a day (TID) | ORAL | 0 refills | Status: AC | PRN
Start: 2022-02-17 — End: 2022-03-19

## 2022-03-17 ENCOUNTER — Encounter

## 2022-03-17 MED ORDER — OXYCODONE-ACETAMINOPHEN 10-325 MG PO TABS
10-325 | ORAL_TABLET | Freq: Three times a day (TID) | ORAL | 0 refills | Status: DC | PRN
Start: 2022-03-17 — End: 2022-04-14

## 2022-03-17 MED ORDER — DIAZEPAM 5 MG PO TABS
5 MG | ORAL_TABLET | Freq: Three times a day (TID) | ORAL | 2 refills | Status: DC | PRN
Start: 2022-03-17 — End: 2022-06-13

## 2022-03-28 ENCOUNTER — Encounter

## 2022-03-28 MED ORDER — HYDROXYZINE HCL 25 MG PO TABS
25 MG | ORAL_TABLET | ORAL | 0 refills | Status: DC
Start: 2022-03-28 — End: 2022-06-13

## 2022-03-28 NOTE — Telephone Encounter (Signed)
Recent Visits  Date Type Provider Dept   01/28/22 Office Visit Antony Contras, APRN - CNP [facility]   10/22/21 Office Visit Antony Contras, APRN - CNP [facility]   10/12/21 Office Visit Antony Contras, APRN - CNP [facility]   03/30/21 Office Visit Antony Contras, APRN - CNP [facility]   Showing recent visits within past 540 days with a meds authorizing provider and meeting all other requirements  Future Appointments  Date Type Provider Dept   08/02/22 Appointment Antony Contras, APRN - CNP [facility]   Showing future appointments within next 150 days with a meds authorizing provider and meeting all other requirements

## 2022-04-04 ENCOUNTER — Encounter

## 2022-04-05 MED ORDER — ONDANSETRON 4 MG PO TBDP
4 | ORAL_TABLET | Freq: Three times a day (TID) | ORAL | 0 refills | Status: DC | PRN
Start: 2022-04-05 — End: 2022-08-16

## 2022-04-11 ENCOUNTER — Encounter

## 2022-04-12 ENCOUNTER — Encounter

## 2022-04-12 MED ORDER — PAROXETINE HCL 40 MG PO TABS
40 MG | ORAL_TABLET | Freq: Every day | ORAL | 3 refills | Status: AC
Start: 2022-04-12 — End: 2022-12-07

## 2022-04-12 MED ORDER — TAMSULOSIN HCL 0.4 MG PO CAPS
0.4 MG | ORAL_CAPSULE | Freq: Two times a day (BID) | ORAL | 3 refills | Status: AC
Start: 2022-04-12 — End: 2022-12-07

## 2022-04-12 MED ORDER — PREGABALIN 100 MG PO CAPS
100 | ORAL_CAPSULE | Freq: Three times a day (TID) | ORAL | 5 refills | Status: DC
Start: 2022-04-12 — End: 2022-08-02

## 2022-04-12 NOTE — Telephone Encounter (Signed)
Recent Visits  Date Type Provider Dept   01/28/22 Office Visit Simpson, Jennifer E, APRN - CNP [facility]   10/22/21 Office Visit Simpson, Jennifer E, APRN - CNP [facility]   10/12/21 Office Visit Simpson, Jennifer E, APRN - CNP [facility]   03/30/21 Office Visit Simpson, Jennifer E, APRN - CNP [facility]   Showing recent visits within past 540 days with a meds authorizing provider and meeting all other requirements  Future Appointments  Date Type Provider Dept   08/02/22 Appointment Simpson, Jennifer E, APRN - CNP [facility]   Showing future appointments within next 150 days with a meds authorizing provider and meeting all other requirements

## 2022-04-14 ENCOUNTER — Encounter

## 2022-04-14 MED ORDER — OXYCODONE-ACETAMINOPHEN 10-325 MG PO TABS
10-325 MG | ORAL_TABLET | Freq: Three times a day (TID) | ORAL | 0 refills | Status: AC | PRN
Start: 2022-04-14 — End: 2022-05-14

## 2022-05-08 ENCOUNTER — Encounter

## 2022-05-09 MED ORDER — OZEMPIC (0.25 OR 0.5 MG/DOSE) 2 MG/3ML SC SOPN
23 MG/3ML | SUBCUTANEOUS | 2 refills | Status: DC
Start: 2022-05-09 — End: 2022-07-29

## 2022-05-09 NOTE — Telephone Encounter (Signed)
Recent Visits  Date Type Provider Dept   01/28/22 Office Visit Antony Contras, APRN - CNP [facility]   10/22/21 Office Visit Antony Contras, APRN - CNP [facility]   10/12/21 Office Visit Antony Contras, APRN - CNP [facility]   03/30/21 Office Visit Antony Contras, APRN - CNP [facility]   Showing recent visits within past 540 days with a meds authorizing provider and meeting all other requirements  Future Appointments  Date Type Provider Dept   08/02/22 Appointment Antony Contras, APRN - CNP [facility]   Showing future appointments within next 150 days with a meds authorizing provider and meeting all other requirements

## 2022-05-12 ENCOUNTER — Encounter

## 2022-05-14 ENCOUNTER — Encounter

## 2022-05-16 ENCOUNTER — Encounter

## 2022-05-16 MED ORDER — MONTELUKAST SODIUM 10 MG PO TABS
10 | ORAL_TABLET | Freq: Every evening | ORAL | 0 refills | Status: DC
Start: 2022-05-16 — End: 2022-08-09

## 2022-05-16 MED ORDER — OXYCODONE-ACETAMINOPHEN 10-325 MG PO TABS
10-325 MG | ORAL_TABLET | Freq: Three times a day (TID) | ORAL | 0 refills | Status: DC | PRN
Start: 2022-05-16 — End: 2022-06-13

## 2022-05-16 NOTE — Telephone Encounter (Signed)
Recent Visits  Date Type Provider Dept   01/28/22 Office Visit Simpson, Jennifer E, APRN - CNP [facility]   10/22/21 Office Visit Simpson, Jennifer E, APRN - CNP [facility]   10/12/21 Office Visit Simpson, Jennifer E, APRN - CNP [facility]   03/30/21 Office Visit Simpson, Jennifer E, APRN - CNP [facility]   Showing recent visits within past 540 days with a meds authorizing provider and meeting all other requirements  Future Appointments  Date Type Provider Dept   08/02/22 Appointment Simpson, Jennifer E, APRN - CNP [facility]   Showing future appointments within next 150 days with a meds authorizing provider and meeting all other requirements

## 2022-05-17 NOTE — Telephone Encounter (Signed)
Sent last week to same pharmacy

## 2022-05-20 ENCOUNTER — Encounter

## 2022-05-20 MED ORDER — BUDESONIDE-FORMOTEROL FUMARATE 80-4.5 MCG/ACT IN AERO
80-4.5 MCG/ACT | Freq: Two times a day (BID) | RESPIRATORY_TRACT | 5 refills | Status: AC
Start: 2022-05-20 — End: 2022-05-23

## 2022-05-20 NOTE — Telephone Encounter (Signed)
Recent Visits  Date Type Provider Dept   01/28/22 Office Visit Simpson, Jennifer E, APRN - CNP [facility]   10/22/21 Office Visit Simpson, Jennifer E, APRN - CNP [facility]   10/12/21 Office Visit Simpson, Jennifer E, APRN - CNP [facility]   03/30/21 Office Visit Simpson, Jennifer E, APRN - CNP [facility]   Showing recent visits within past 540 days with a meds authorizing provider and meeting all other requirements  Future Appointments  Date Type Provider Dept   08/02/22 Appointment Simpson, Jennifer E, APRN - CNP [facility]   Showing future appointments within next 150 days with a meds authorizing provider and meeting all other requirements

## 2022-05-23 ENCOUNTER — Telehealth

## 2022-05-23 MED ORDER — BUDESONIDE-FORMOTEROL FUMARATE 80-4.5 MCG/ACT IN AERO
80-4.5 MCG/ACT | Freq: Two times a day (BID) | RESPIRATORY_TRACT | 5 refills | Status: AC
Start: 2022-05-23 — End: 2022-11-14

## 2022-05-23 NOTE — Telephone Encounter (Signed)
[facility] states the budesonide-formoterol is not covered under pt's ins    The Symbicort is covered need DAW on RX

## 2022-05-23 NOTE — Telephone Encounter (Signed)
Prescription written

## 2022-05-26 NOTE — Telephone Encounter (Signed)
Pt requesting a letter stating he was a new patient in our office on 09/24/14 on our letterhead  He needs this to give to vital records to get his birth certificate to get an Tour manager

## 2022-05-26 NOTE — Telephone Encounter (Signed)
Letter placed  Will print and sign tomorrow  If prints off today please just throw it on my desk

## 2022-05-26 NOTE — Telephone Encounter (Signed)
Letter printed and placed on your desk for signature.

## 2022-05-27 NOTE — Telephone Encounter (Signed)
Patient will be in to pick up letter

## 2022-06-13 ENCOUNTER — Encounter

## 2022-06-13 MED ORDER — OXYCODONE-ACETAMINOPHEN 10-325 MG PO TABS
10-325 MG | ORAL_TABLET | Freq: Three times a day (TID) | ORAL | 0 refills | Status: AC | PRN
Start: 2022-06-13 — End: 2022-07-13

## 2022-06-13 MED ORDER — HYDROXYZINE HCL 25 MG PO TABS
25 MG | ORAL_TABLET | ORAL | 0 refills | Status: AC
Start: 2022-06-13 — End: 2022-07-12

## 2022-06-13 MED ORDER — DIAZEPAM 5 MG PO TABS
5 MG | ORAL_TABLET | Freq: Three times a day (TID) | ORAL | 2 refills | Status: AC | PRN
Start: 2022-06-13 — End: 2022-09-11

## 2022-06-13 NOTE — Telephone Encounter (Signed)
Recent Visits  Date Type Provider Dept   01/28/22 Office Visit Simpson, Jennifer E, APRN - CNP [facility]   10/22/21 Office Visit Simpson, Jennifer E, APRN - CNP [facility]   10/12/21 Office Visit Simpson, Jennifer E, APRN - CNP [facility]   03/30/21 Office Visit Simpson, Jennifer E, APRN - CNP [facility]   Showing recent visits within past 540 days with a meds authorizing provider and meeting all other requirements  Future Appointments  Date Type Provider Dept   08/02/22 Appointment Simpson, Jennifer E, APRN - CNP [facility]   Showing future appointments within next 150 days with a meds authorizing provider and meeting all other requirements

## 2022-07-12 ENCOUNTER — Encounter

## 2022-07-12 MED ORDER — HYDROXYZINE HCL 25 MG PO TABS
25 MG | ORAL_TABLET | ORAL | 0 refills | Status: DC
Start: 2022-07-12 — End: 2022-08-10

## 2022-07-12 MED ORDER — OXYCODONE-ACETAMINOPHEN 10-325 MG PO TABS
10-325 MG | ORAL_TABLET | Freq: Three times a day (TID) | ORAL | 0 refills | Status: DC | PRN
Start: 2022-07-12 — End: 2022-08-09

## 2022-07-29 ENCOUNTER — Encounter

## 2022-07-29 MED ORDER — OZEMPIC (0.25 OR 0.5 MG/DOSE) 2 MG/3ML SC SOPN
23 MG/3ML | SUBCUTANEOUS | 2 refills | Status: AC
Start: 2022-07-29 — End: 2022-10-31

## 2022-07-29 NOTE — Telephone Encounter (Signed)
Recent Visits  Date Type Provider Dept   01/28/22 Office Visit Simpson, Jennifer E, APRN - CNP [facility]   10/22/21 Office Visit Simpson, Jennifer E, APRN - CNP [facility]   10/12/21 Office Visit Simpson, Jennifer E, APRN - CNP [facility]   03/30/21 Office Visit Simpson, Jennifer E, APRN - CNP [facility]   Showing recent visits within past 540 days with a meds authorizing provider and meeting all other requirements  Future Appointments  Date Type Provider Dept   08/02/22 Appointment Simpson, Jennifer E, APRN - CNP [facility]   Showing future appointments within next 150 days with a meds authorizing provider and meeting all other requirements

## 2022-08-01 ENCOUNTER — Encounter

## 2022-08-01 MED ORDER — ONETOUCH DELICA PLUS LANCET33G MISC
0 refills | Status: DC
Start: 2022-08-01 — End: 2022-11-10

## 2022-08-01 NOTE — Telephone Encounter (Signed)
Future Appointments   Date Time Provider Department Center   08/02/2022  1:00 PM Jone Baseman, APRN - CNP [facility] [name]   12/19/2022 11:30 AM [name] Balboa, MD N [facility] - [place]

## 2022-08-02 ENCOUNTER — Ambulatory Visit: Admit: 2022-08-02 | Discharge: 2022-08-02 | Payer: MEDICARE | Attending: Family | Primary: Family

## 2022-08-02 VITALS — BP 124/78 | HR 88 | Temp 97.70000°F | Resp 14 | Ht 67.0 in | Wt 212.4 lb

## 2022-08-02 DIAGNOSIS — E119 Type 2 diabetes mellitus without complications: Principal | ICD-10-CM

## 2022-08-02 LAB — POCT GLYCOSYLATED HEMOGLOBIN (HGB A1C): Hemoglobin A1C POC: 6.2 % — ABNORMAL HIGH (ref 4.3–5.7)

## 2022-08-02 MED ORDER — PREGABALIN 150 MG PO CAPS
150 MG | ORAL_CAPSULE | Freq: Three times a day (TID) | ORAL | 1 refills | Status: DC
Start: 2022-08-02 — End: 2022-12-07

## 2022-08-02 MED ORDER — LORATADINE 10 MG PO TABS
10 MG | ORAL_TABLET | Freq: Every day | ORAL | 3 refills | Status: AC
Start: 2022-08-02 — End: ?

## 2022-08-02 NOTE — Progress Notes (Signed)
Micheal [name] is a 67 y.o. male for Follow-up (Diabetes )      Diabetes Type 2    Glucose control:   Does patient check blood glucoses at home?  Yes  Report of hypoglycemia: no  Lab Results   Component Value Date    LABA1C 6.2 (H) 08/02/2022     Lab Results   Component Value Date    EAG 147 (H) 09/03/2014         Symptoms  Polyuria, Polydipsia or Polyphagia?   No  Chest Pain, SOB, or Palpitations? -  No  New Vision complaints? No  Paresthesias of the extremities?  Nothing new    Medications  Current medication were reviewed.  Compliant with medications?  yes  Medication side effects?  No  On ACE-I or ARB?  Yes - Lisinopril  On antiplatelet therapy?  Yes - Brillinta  On Statin?  Yes - Atorvastatin    Last Diabetic Eye Exam: UTD    Exercise  Exercise? Limited d/t chronic pain  Wt Readings from Last 3 Encounters:   08/02/22 96.3 kg (212 lb 6.4 oz)   01/28/22 96.6 kg (213 lb)   12/15/21 96.2 kg (212 lb)       Diet discipline?:  Low salt, fat, sugar diet?  yes    Blood pressure control:  BP Readings from Last 3 Encounters:   08/02/22 124/78   01/28/22 122/78   12/15/21 98/68       No results found for: "MALB24HUR"    No results found for: "LDLDIRECT"    Chronic Pain    HPI:    Patient has chronic pain of the lower back.  Pain control: inadequate  Improvement in function and ADLs?  yes  Present for: years  Current Medications:  Percocet 10-325 mg TID PRN  Escalation of dosage recently?  no    Evidence for abuse or diversion? no   Seen specialist or pain clinic?: no  Last UDS:  due  Pain contract: needs new one signed    Controlled Substances Monitoring:    I reviewed an OARRS report on patient today.  There were no abnormal findings on the report.    Physical Exam    BP 124/78   Pulse 88   Temp 97.7 F (36.5 C) (Oral)   Resp 14   Ht 1.702 m (5\' 7" )   Wt 96.3 kg (212 lb 6.4 oz)   SpO2 94%   BMI 33.27 kg/m   Appearance: alert, well appearing, and in no distress, normal appearing weight and well hydrated.  Neck exam  - supple, no significant adenopathy.  CVS exam: normal rate, regular rhythm, normal S1, S2, no murmurs, rubs, clicks or gallops.  Lungs: clear to auscultation, no wheezes, rales or rhonchi, symmetric air entry.  Abdomen:  BS normal, soft, non tender, non distended, no rebound or guarding  Mental Status: normal mood, affect behavior, speech, dress,  and thought processes.  Neuro:  Alert, muscle tone grossly normal  Skin exam: normal coloration and turgor, no rashes, no suspicious skin lesions noted.  Foot exam:  No pedal edema, no erythematous lesions noted b/l, sensation wnl b/l, PT and TP pulses wnl b/l    ASSESSMENT & PLAN  Micheal [name] was seen today for follow-up.    Diagnoses and all orders for this visit:    Type 2 diabetes mellitus without complication, without long-term current use of insulin (HCC)    Screening for AAA (abdominal aortic aneurysm)  -  Vascular AAA screening; Future    Encounter for administration of vaccine  -     Tdap, BOOSTRIX, (age 83 yrs+), IM    Chronic bilateral low back pain with bilateral sciatica  -     pregabalin (LYRICA) 150 MG capsule; Take 1 capsule by mouth in the morning, at noon, and at bedtime for 180 days. Max Daily Amount: 450 mg    Sedative, hypnotic or anxiolytic dependence, uncomplicated (HCC)    Congestive heart failure, unspecified HF chronicity, unspecified heart failure type (HCC)    Coronary artery disease of native heart with stable angina pectoris, unspecified vessel or lesion type (HCC)    Seasonal allergies  -     loratadine (CLARITIN) 10 MG tablet; Take 1 tablet by mouth daily    Essential hypertension    GAD (generalized anxiety disorder)    Other orders  -     POCT glycosylated hemoglobin (Hb A1C)    Pt wanting Percocet frequency increased or Valium increased  Decline prescribing this  At this point, if pt wanting meds increased will need to see pain mgmt  Pt declines   Recommend f/u in 6 mos    No follow-ups on file.    I spent 30 minutes reviewing previous  notes and testing, discussing symptoms, medications and side effects, treatment options with the patient, performing an exam, and developing a plan of care.  All questions answered.  Patient verbalized understanding.

## 2022-08-02 NOTE — Patient Instructions (Signed)
Stop Cetirizine  Start Claritin

## 2022-08-08 ENCOUNTER — Encounter

## 2022-08-08 MED ORDER — LISINOPRIL 40 MG PO TABS
40 MG | ORAL_TABLET | Freq: Every day | ORAL | 3 refills | Status: DC
Start: 2022-08-08 — End: 2022-12-07

## 2022-08-08 NOTE — Telephone Encounter (Signed)
Future Appointments   Date Time Provider Department Center   08/15/2022  7:00 AM STR ULTRASOUND RM 2 [facility] [place] STR Rad/Card   12/07/2022  1:00 PM Jone Baseman, APRN - CNP [facility] [place]   12/19/2022 11:30 AM [name] Balboa, MD [facility] [place]

## 2022-08-09 ENCOUNTER — Encounter

## 2022-08-09 MED ORDER — MONTELUKAST SODIUM 10 MG PO TABS
10 MG | ORAL_TABLET | Freq: Every evening | ORAL | 3 refills | Status: AC
Start: 2022-08-09 — End: ?

## 2022-08-09 NOTE — Telephone Encounter (Signed)
Future Appointments   Date Time Provider Department Center   08/15/2022  7:00 AM STR ULTRASOUND RM 2 [facility] [place] STR Rad/Card   12/07/2022  1:00 PM Jone Baseman, APRN - CNP [facility] [place]   12/19/2022 11:30 AM [name] Balboa, MD [facility] [place]

## 2022-08-09 NOTE — Telephone Encounter (Signed)
Addressed in another encounter to correct provider.

## 2022-08-10 MED ORDER — HYDROXYZINE HCL 25 MG PO TABS
25 MG | ORAL_TABLET | ORAL | 3 refills | Status: AC
Start: 2022-08-10 — End: 2022-11-03

## 2022-08-10 MED ORDER — OXYCODONE-ACETAMINOPHEN 10-325 MG PO TABS
10-325 MG | ORAL_TABLET | Freq: Three times a day (TID) | ORAL | 0 refills | Status: AC | PRN
Start: 2022-08-10 — End: 2022-09-09

## 2022-08-10 MED ORDER — DIAZEPAM 5 MG PO TABS
5 MG | ORAL_TABLET | Freq: Three times a day (TID) | ORAL | 2 refills | Status: AC | PRN
Start: 2022-08-10 — End: 2022-11-08

## 2022-08-12 ENCOUNTER — Ambulatory Visit: Payer: MEDICARE | Primary: Family

## 2022-08-15 ENCOUNTER — Encounter

## 2022-08-15 ENCOUNTER — Ambulatory Visit: Payer: MEDICARE | Primary: Family

## 2022-08-16 MED ORDER — ONDANSETRON 4 MG PO TBDP
4 | ORAL_TABLET | ORAL | 0 refills | Status: DC
Start: 2022-08-16 — End: 2023-08-24

## 2022-08-16 NOTE — Telephone Encounter (Signed)
-----   Message from Jone Baseman, APRN - CNP sent at 08/02/2022  1:31 PM EDT -----  See how pt is doing on higher dose of Lyrica

## 2022-08-16 NOTE — Telephone Encounter (Signed)
Patient said he can not tell the difference. He said he just started a week ago.

## 2022-08-16 NOTE — Telephone Encounter (Signed)
Okay, let us know how he is doing with it after a couple weeks.

## 2022-08-16 NOTE — Telephone Encounter (Signed)
Future Appointments   Date Time Provider Department Center   08/31/2022  9:00 AM STR ULTRASOUND RM 2 [facility] [place] STR Rad/Card   12/07/2022  1:00 PM Jone Baseman, APRN - CNP [facility] [place]   12/19/2022 11:30 AM [name] Balboa, MD [facility] - [place]

## 2022-08-16 NOTE — Telephone Encounter (Signed)
Left message on answering machine requesting pt to call back at earliest convenience.

## 2022-08-16 NOTE — Telephone Encounter (Signed)
Pt informed and understanding with no further questions at this time.

## 2022-08-20 ENCOUNTER — Encounter

## 2022-08-22 MED ORDER — FLUTICASONE PROPIONATE 50 MCG/ACT NA SUSP
50 | NASAL | 0 refills | Status: AC
Start: 2022-08-22 — End: ?

## 2022-08-22 NOTE — Telephone Encounter (Signed)
Recent Visits  Date Type Provider Dept   08/02/22 Office Visit Jone Baseman, APRN - CNP [facility]   01/28/22 Office Visit Jone Baseman, APRN - CNP [facility]   10/22/21 Office Visit Jone Baseman, APRN - CNP [facility]   10/12/21 Office Visit Jone Baseman, APRN - CNP [facility]   03/30/21 Office Visit Jone Baseman, APRN - CNP [facility]   Showing recent visits within past 540 days with a meds authorizing provider and meeting all other requirements  Future Appointments  Date Type Provider Dept   12/07/22 Appointment Jone Baseman, APRN - CNP [facility]   Showing future appointments within next 150 days with a meds authorizing provider and meeting all other requirements

## 2022-08-29 ENCOUNTER — Encounter

## 2022-08-30 MED ORDER — ESOMEPRAZOLE MAGNESIUM 40 MG PO CPDR
40 MG | ORAL_CAPSULE | ORAL | 3 refills | Status: AC
Start: 2022-08-30 — End: 2022-12-07

## 2022-08-30 NOTE — Telephone Encounter (Signed)
Future Appointments   Date Time Provider Department Center   08/31/2022  9:00 AM STR ULTRASOUND RM 2 [facility] [place] STR Rad/Card   12/07/2022  1:00 PM Jone Baseman, APRN - CNP [facility] DE[facility]   12/19/2022 11:30 AM [name] Balboa, MD [facility] - [place]

## 2022-08-31 ENCOUNTER — Ambulatory Visit: Payer: MEDICARE | Primary: Family

## 2022-08-31 DIAGNOSIS — Z136 Encounter for screening for cardiovascular disorders: Secondary | ICD-10-CM

## 2022-08-31 NOTE — Telephone Encounter (Signed)
Left detailed message of results. Okay per HIPAA. Requested call back if any further questions

## 2022-08-31 NOTE — Telephone Encounter (Signed)
-----   Message from Clovis Riley, APRN - CNP sent at 08/31/2022  2:03 PM EDT -----  Let Micheal [name] know his [place] of his abd aorta is normal. No further testing required.

## 2022-09-07 ENCOUNTER — Encounter

## 2022-09-07 MED ORDER — OXYCODONE-ACETAMINOPHEN 10-325 MG PO TABS
10-325 | ORAL_TABLET | Freq: Three times a day (TID) | ORAL | 0 refills | Status: AC | PRN
Start: 2022-09-07 — End: 2022-10-07

## 2022-09-07 MED ORDER — DIAZEPAM 5 MG PO TABS
5 MG | ORAL_TABLET | Freq: Three times a day (TID) | ORAL | 2 refills | Status: AC | PRN
Start: 2022-09-07 — End: 2022-12-06

## 2022-09-07 NOTE — Telephone Encounter (Signed)
I reviewed an OARRS report on patient today.  There were no abnormal findings on the report.  Refills sent  Electronically signed by Jone Baseman, APRN - CNP on 09/07/2022 at 10:20 AM

## 2022-09-21 NOTE — Telephone Encounter (Signed)
 Attempted to contact pt's wife, phone rang busy.

## 2022-09-21 NOTE — Telephone Encounter (Signed)
 Pt's wife called asking for a referral to Dr.Salma for his back    Please advise

## 2022-09-21 NOTE — Telephone Encounter (Signed)
 Pt will need an apt to discuss  Typically, Micheal [name]'s office will require updated imaging, like an MRI or CT prior to them accepting the referral.    The last MRI report I have available was in DEC 2019 and it didn't, at that time, show anything surgical    His insurance is going to require, at the very least, an apt in the office prior to them approving an updated MRI. They may also require a course of physical therapy 1st before approving. But we'd need to see him 1st to evaluate what's going on.      MRI report from [facility] 2019

## 2022-09-22 NOTE — Telephone Encounter (Signed)
 Patient informed, verbally understood. Pt will call the office if he decides to make an appointment

## 2022-09-22 NOTE — Telephone Encounter (Signed)
 LMOM to return call at his earliest convenience

## 2022-09-27 NOTE — Telephone Encounter (Signed)
 Pt called back. He switched insurances and is waiting on his new cards to get here. He will call and schedule an appt when he gets them to make sure its covered.

## 2022-10-07 ENCOUNTER — Encounter

## 2022-10-07 MED ORDER — OXYCODONE-ACETAMINOPHEN 10-325 MG PO TABS
10-325 | ORAL_TABLET | Freq: Three times a day (TID) | ORAL | 0 refills | Status: DC | PRN
Start: 2022-10-07 — End: 2022-11-03

## 2022-10-18 ENCOUNTER — Encounter

## 2022-10-18 NOTE — Telephone Encounter (Signed)
 Recent Visits  Date Type Provider Dept   08/02/22 Office Visit Jone Baseman, APRN - CNP [facility]   01/28/22 Office Visit Jone Baseman, APRN - CNP [facility]   10/22/21 Office Visit Jone Baseman, APRN - CNP [facility]   10/12/21 Office Visit Jone Baseman, APRN - CNP [facility]   Showing recent visits within past 540 days with a meds authorizing provider and meeting all other requirements  Future Appointments  Date Type Provider Dept   12/07/22 Appointment Jone Baseman, APRN - CNP [facility]   Showing future appointments within next 150 days with a meds authorizing provider and meeting all other requirements

## 2022-10-19 MED ORDER — FLUTICASONE PROPIONATE 50 MCG/ACT NA SUSP
50 | NASAL | 3 refills | Status: DC
Start: 2022-10-19 — End: 2023-01-02

## 2022-10-24 ENCOUNTER — Encounter

## 2022-10-24 MED ORDER — FENOFIBRATE 145 MG PO TABS
145 | ORAL_TABLET | Freq: Every day | ORAL | 3 refills | Status: DC
Start: 2022-10-24 — End: 2022-12-07

## 2022-10-24 NOTE — Telephone Encounter (Signed)
 Recent Visits  Date Type Provider Dept   08/02/22 Office Visit Jone Baseman, APRN - CNP [facility]   01/28/22 Office Visit Jone Baseman, APRN - CNP [facility]   10/22/21 Office Visit Jone Baseman, APRN - CNP [facility]   10/12/21 Office Visit Jone Baseman, APRN - CNP [facility]   Showing recent visits within past 540 days with a meds authorizing provider and meeting all other requirements  Future Appointments  Date Type Provider Dept   12/07/22 Appointment Jone Baseman, APRN - CNP [facility]   Showing future appointments within next 150 days with a meds authorizing provider and meeting all other requirements

## 2022-10-28 ENCOUNTER — Encounter

## 2022-10-31 ENCOUNTER — Encounter

## 2022-10-31 MED ORDER — TICAGRELOR 90 MG PO TABS
90 MG | ORAL_TABLET | ORAL | 0 refills | Status: DC
Start: 2022-10-31 — End: 2022-12-07

## 2022-10-31 MED ORDER — OZEMPIC (0.25 OR 0.5 MG/DOSE) 2 MG/3ML SC SOPN
2 MG/3ML | SUBCUTANEOUS | 0 refills | Status: DC
Start: 2022-10-31 — End: 2022-11-24

## 2022-10-31 NOTE — Telephone Encounter (Signed)
Recent Visits  Date Type Provider Dept   08/02/22 Office Visit Jone Baseman, APRN - CNP [facility]   01/28/22 Office Visit Jone Baseman, APRN - CNP [facility]   10/22/21 Office Visit Jone Baseman, APRN - CNP [facility]   10/12/21 Office Visit Jone Baseman, APRN - CNP [facility]   Showing recent visits within past 540 days with a meds authorizing provider and meeting all other requirements  Future Appointments  Date Type Provider Dept   12/07/22 Appointment Jone Baseman, APRN - CNP [facility]   Showing future appointments within next 150 days with a meds authorizing provider and meeting all other requirements

## 2022-11-03 ENCOUNTER — Encounter

## 2022-11-03 MED ORDER — OXYCODONE-ACETAMINOPHEN 10-325 MG PO TABS
10-325 MG | ORAL_TABLET | Freq: Three times a day (TID) | ORAL | 0 refills | Status: DC | PRN
Start: 2022-11-03 — End: 2022-12-01

## 2022-11-03 MED ORDER — HYDROXYZINE HCL 25 MG PO TABS
25 MG | ORAL_TABLET | ORAL | 3 refills | Status: DC
Start: 2022-11-03 — End: 2022-12-07

## 2022-11-03 NOTE — Telephone Encounter (Signed)
Patient called in requesting medications refill to be sent to attached pharmacy

## 2022-11-03 NOTE — Telephone Encounter (Signed)
I reviewed an OARRS report on patient today.  There were no abnormal findings on the report.  Rx sent  Electronically signed by Jone Baseman, APRN - CNP on 11/03/22 at 4:34 PM EDT

## 2022-11-09 ENCOUNTER — Ambulatory Visit: Admit: 2022-11-09 | Discharge: 2022-11-09 | Payer: MEDICARE | Attending: Surgery | Primary: Family

## 2022-11-09 DIAGNOSIS — Z955 Presence of coronary angioplasty implant and graft: Secondary | ICD-10-CM

## 2022-11-09 NOTE — Progress Notes (Signed)
Subjective   Patient ID: Micheal [name] is a 67 y.o. male.    HPI  Chief Complaint   Patient presents with    Surgical Consult     Est pt last seen 2019 - 5 year colonoscopy recall       Review of Systems   Constitutional:  Negative for activity change, appetite change, chills, diaphoresis, fatigue, fever and unexpected weight change.   HENT: Negative.  Negative for congestion, dental problem, drooling, ear discharge, ear pain, facial swelling, hearing loss, mouth sores, nosebleeds, postnasal drip, rhinorrhea, sinus pressure, sinus pain, sneezing, sore throat, tinnitus, trouble swallowing and voice change.    Eyes:  Negative for photophobia, pain, discharge, redness, itching and visual disturbance.   Respiratory:  Negative for apnea, cough, choking, chest tightness, shortness of breath, wheezing and stridor.    Cardiovascular:  Negative for chest pain, palpitations and leg swelling.   Gastrointestinal:  Negative for abdominal distention, abdominal pain, anal bleeding, blood in stool, constipation, diarrhea, nausea, rectal pain and vomiting.   Endocrine: Negative for cold intolerance, heat intolerance, polydipsia, polyphagia and polyuria.   Genitourinary:  Negative for decreased urine volume, difficulty urinating, dysuria, enuresis, flank pain, frequency, genital sores, hematuria, penile discharge, penile pain, penile swelling, scrotal swelling, testicular pain and urgency.   Musculoskeletal:  Negative for arthralgias, back pain, gait problem, joint swelling, myalgias, neck pain and neck stiffness.   Skin:  Negative for color change, pallor, rash and wound.   Allergic/Immunologic: Negative for environmental allergies, food allergies and immunocompromised state.   Neurological:  Negative for dizziness, tremors, seizures, syncope, facial asymmetry, speech difficulty, weakness, light-headedness, numbness and headaches.   Hematological:  Negative for adenopathy. Does not bruise/bleed easily.   Psychiatric/Behavioral:   Negative for agitation, behavioral problems, confusion, decreased concentration, dysphoric mood, hallucinations, self-injury, sleep disturbance and suicidal ideas. The patient is not nervous/anxious and is not hyperactive.           Objective   Physical Exam   BP 128/86 (Site: Right Upper Arm, Position: Sitting, Cuff Size: Medium Adult)   Pulse 68   Temp 97.8 F (36.6 C) (Temporal)   Resp 18   Ht 1.702 m (5\' 7" )   Wt 98 kg (216 lb)   SpO2 97%   BMI 33.83 kg/m       Assessment         Plan           Dene Gentry, RN

## 2022-11-09 NOTE — Progress Notes (Signed)
[facility]        Florian Buff MD FACS  General Surgery  New Patient Evaluation in Office  Pt Name: Micheal [name]  Date of Birth March 07, 1955   Today's Date: 11/09/2022  Medical Record Number: [id number]  Referring Provider: No ref. provider found  Primary Care Provider: Jone Baseman, APRN - CNP  Chief Complaint   Patient presents with    Surgical Consult     Est pt last seen 2019 - 5 year colonoscopy recall     ASSESSMENT       Problem List Items Addressed This Visit       History of coronary artery stent placement - Primary    Essential hypertension    Type 2 diabetes mellitus without complication, without long-term current use of insulin (HCC)    Adenomatous polyp of transverse colon    History of adenomatous polyp of colon    Relevant Orders    COLONOSCOPY W/ OR W/O BIOPSY     There are no active hospital problems to display for this patient.     American Cancer Society recommends screening for average risk patient starting at age 61 which could be any of the following   1.  Yearly fit test   2.  Every 3 years cologuard test   3   Every 10 years screening colonoscopy       PLANS   Assessment & Plan   Schedule [place] for colonoscopy with possible biopsy/polypectomy  Potential diagnostic/therapeutic options and alternatives were discussed with the patient in the office. Potential risks of bleeding, infection, perforation and missed lesions was reviewed. Potential for biopsy and/or polypectomy was discussed. We also discussed the use of IV sedation and colon preparation instructions. The patient was given an opportunity to ask questions. Once answered, the patient was agreeable to proceed with the procedure.  Status: Outpatient in endoscopy  Planned anesthesia: IV sedation provided by anesthesia  5.   Perioperative discontinuation of             ASA, Plavix, warfarin, Brillinta, Effient, Pradaxa, Eliquis, and Xarelto.       Continuation of 81 mg Aspirin is acceptable.  6.   Perioperative  bowel preparation with Miralax and Dulcolax. Instructions given and questions answered.  7.  Will need to get medical clearance from Dr. Gabrielle Dare to stop his Brilinta at least 5 to 7 days before the procedure  8.  His medical comorbidities were reviewed will adjust or hold medications that he uses for his blood pressure his diabetes and his coronary artery disease as needed in order to perform this procedure safely.  Orders Placed This Encounter:  Orders Placed This Encounter   Procedures    COLONOSCOPY W/ OR W/O BIOPSY     Standing Status:   Future     Standing Expiration Date:   11/09/2023     Order Specific Question:   Screening or Diagnostic?     Answer:   Screening        SUBJECTIVE      Micheal [name] is a 67 y.o. male seen regarding colonoscopy. He had a previous colonoscopy by me on December 19 2017 at that time he was found to have a 2 polyps 1 at 65 cm and 1 at 70 cm of the transverse colon which was removed and found to be adenomatous.  He denies any history of previous colon cancer, family history of colon cancer, rectal bleeding, melena, iron deficiency anemia, chronic abdominal  pain, change in bowel habits, unexplained weight loss.  Past Medical History  Past Medical History:   Diagnosis Date    Arthritis     general    CAD (coronary artery disease)     s/p stent placement-sees Dr. Gabrielle Dare    Chronic back pain     Diabetes mellitus (HCC)     GERD (gastroesophageal reflux disease)     Hyperlipidemia     Hypertension        Past Surgical History  Past Surgical History:   Procedure Laterality Date    [place]  [place]    COLONOSCOPY Left 12/19/2017    COLONOSCOPY POLYPECTOMY HOT BIOPSY performed by Florian Buff, MD at [facility]    CORONARY ARTERY BYPASS GRAFT  2009    6 [place]    DIAGNOSTIC CARDIAC CATH LAB PROCEDURE      ORTHOPEDIC SURGERY Left     PTCA  07/04/2016    TONSILLECTOMY         Medications  Current Outpatient Medications on File Prior to Visit    Medication Sig Dispense Refill    oxyCODONE-acetaminophen (PERCOCET) 10-325 MG per tablet Take 1 tablet by mouth every 8 hours as needed for Pain for up to 30 days. 90 tablet 0    hydrOXYzine HCl (ATARAX) 25 MG tablet TAKE 1 TABLET BY MOUTH EVERY 8 HOURS AS NEEDED FOR PAIN AND  SPASMING.  TAKE  WITH  PAIN  MEDICATION 90 tablet 3    OZEMPIC, 0.25 OR 0.5 MG/DOSE, 2 MG/3ML SOPN INJECT 0.5 MG SUBCUTANEOUSLY  ONCE A WEEK 3 mL 0    ticagrelor (BRILINTA) 90 MG TABS tablet Take 1 tablet by mouth twice daily 180 tablet 0    fenofibrate (TRICOR) 145 MG tablet Take 1 tablet by mouth once daily 90 tablet 3    fluticasone (FLONASE) 50 MCG/ACT nasal spray Use 2 spray(s) in each nostril once daily 16 g 3    diazePAM (VALIUM) 5 MG tablet Take 1 tablet by mouth every 8 hours as needed for Anxiety for up to 90 days. Max Daily Amount: 15 mg 90 tablet 2    esomeprazole (NEXIUM) 40 MG delayed release capsule TAKE 1 CAPSULE BY MOUTH ONCE DAILY IN THE MORNING BEFORE BREAKFAST 90 capsule 3    ondansetron (ZOFRAN-ODT) 4 MG disintegrating tablet DISSOLVE 1 TABLET IN MOUTH THREE TIMES DAILY AS NEEDED FOR NAUSEA FOR VOMITING 21 tablet 0    montelukast (SINGULAIR) 10 MG tablet Take 1 tablet by mouth nightly 90 tablet 3    lisinopril (PRINIVIL;ZESTRIL) 40 MG tablet Take 1 tablet by mouth once daily 90 tablet 3    pregabalin (LYRICA) 150 MG capsule Take 1 capsule by mouth in the morning, at noon, and at bedtime for 180 days. Max Daily Amount: 450 mg 270 capsule 1    loratadine (CLARITIN) 10 MG tablet Take 1 tablet by mouth daily 90 tablet 3    Lancets (ONETOUCH DELICA PLUS LANCET33G) MISC USE 1  TO CHECK GLUCOSE ONCE DAILY 100 each 0    budesonide-formoterol (SYMBICORT) 80-4.5 MCG/ACT AERO Inhale 2 puffs into the lungs 2 times daily 11 g 5    tamsulosin (FLOMAX) 0.4 MG capsule Take 1 capsule by mouth twice daily 180 capsule 3    PARoxetine (PAXIL) 40 MG tablet Take 1 tablet by mouth daily 90 tablet 3    olopatadine (PATANOL) 0.1 % ophthalmic  solution Place 1 drop into both eyes 2 times  daily 3 each 3    albuterol sulfate HFA (PROVENTIL;VENTOLIN;PROAIR) 108 (90 Base) MCG/ACT inhaler Inhale 2 puffs into the lungs every 6 hours as needed for Wheezing 18 g 3    cetirizine (ZYRTEC) 10 MG tablet Take 1 tablet by mouth every morning 90 tablet 3    glimepiride (AMARYL) 2 MG tablet Take 1 tablet by mouth twice daily 180 tablet 3    metFORMIN (GLUCOPHAGE) 1000 MG tablet Take 1 tablet by mouth 2 times daily (with meals) 180 tablet 3    atorvastatin (LIPITOR) 80 MG tablet Take 1 tablet by mouth daily 90 tablet 3    metoprolol succinate (TOPROL XL) 25 MG extended release tablet Take 1 tablet by mouth daily 90 tablet 3    isosorbide mononitrate (IMDUR) 30 MG extended release tablet Take 1 tablet by mouth daily 90 tablet 3    hydrocortisone 2.5 % cream Apply to external ear BID PRN 3 each 3    blood glucose monitor kit and supplies Dispense sufficient amount for indicated testing frequency plus additional to accommodate PRN testing needs. Dispense all needed supplies to include: monitor, strips, lancing device, lancets, control solutions, alcohol swabs. 1 kit 0    blood glucose monitor strips Test 1 times a day & as needed for symptoms of irregular blood glucose. Dispense sufficient amount for indicated testing frequency plus additional to accommodate PRN testing needs. 300 strip 0    pioglitazone (ACTOS) 15 MG tablet Take 1 tablet by mouth once daily 90 tablet 3    Handicap Placard MISC by Does not apply route Expires 06/30/25 1 each 0    NARCAN 4 MG/0.1ML LIQD nasal spray ADMINISTER A SINGLE SPRAY INTRANASALLY INTO ONE NOSTRIL. CALL 911. MAY REPEAT X 1.      nitroGLYCERIN (NITROSTAT) 0.4 MG SL tablet Place 1 tablet under the tongue every 5 minutes as needed for Chest pain 25 tablet 2    Multiple Vitamins-Minerals (CENTRUM SILVER ULTRA MENS) TABS Take 1 tablet by mouth daily       No current facility-administered medications on file prior to visit.      Allergies  Allergies   Allergen Reactions    Gabapentin        Family History  Family History   Problem Relation Age of Onset    Cancer Mother     Heart Disease Brother     Stroke Brother     Diabetes Brother     Early Death Brother     High Blood Pressure Brother     High Cholesterol Brother     Mental Retardation Sister        Social History  Social History     Socioeconomic History    Marital status: Married     Spouse name: Not on file    Number of children: Not on file    Years of education: Not on file    Highest education level: Not on file   Occupational History    Not on file   Tobacco Use    Smoking status: Former     Current packs/day: 0.00     Average packs/day: 1 pack/day for 15.0 years (15.0 ttl pk-yrs)     Types: Cigarettes     Start date: 05/24/1993     Quit date: 05/24/2008     Years since quitting: 14.4    Smokeless tobacco: Never   Vaping Use    Vaping status: Never Used   Substance and Sexual Activity  Alcohol use: Yes     Alcohol/week: 1.0 standard drink of alcohol     Types: 1 Glasses of wine per week     Comment: once monthly stopped a over 2 years    Drug use: No    Sexual activity: Yes     Partners: Female   Other Topics Concern    Not on file   Social History Narrative    Not on file     Social Determinants of Health     Financial Resource Strain: Medium Risk (10/12/2021)    Overall Financial Resource Strain (CARDIA)     Difficulty of Paying Living Expenses: Somewhat hard   Food Insecurity: Not on file (10/12/2021)   Recent Concern: Food Insecurity - Food Insecurity Present (10/12/2021)    Hunger Vital Sign     Worried About Running Out of Food in the Last Year: Often true     Ran Out of Food in the Last Year: Often true   Transportation Needs: Unmet Transportation Needs (10/12/2021)    PRAPARE - Therapist, art (Medical): Not on file     Lack of Transportation (Non-Medical): Yes   Physical Activity: Sufficiently Active (10/22/2021)    Exercise Vital Sign     Days of  Exercise per Week: 7 days     Minutes of Exercise per Session: 40 min   Stress: Not on file   Social Connections: Not on file   Intimate Partner Violence: Not on file   Housing Stability: Unknown (10/12/2021)    Housing Stability Vital Sign     Unable to Pay for Housing in the Last Year: Not on file     Number of Places Lived in the Last Year: Not on file     Unstable Housing in the Last Year: No      Health Screening Exams  Health Maintenance   Topic Date Due    Diabetic retinal exam  Never done    Diabetic Alb to Cr ratio (uACR) test  03/04/2020    Diabetic foot exam  10/13/2022    GFR test (Diabetes, CKD 3-4, OR last GFR 15-59)  11/17/2022    Respiratory Syncytial Virus (RSV) Pregnant or age 73 yrs+ (1 - 1-dose 60+ series) 01/29/2023 (Originally 06/12/2014)    Colorectal Cancer Screen  12/20/2022    Lipids  01/04/2023    Depression Screen  01/29/2023    COVID-19 Vaccine (7 - 2023-24 season) 03/02/2023    A1C test (Diabetic or Prediabetic)  08/02/2023    DTaP/Tdap/Td vaccine (2 - Td or Tdap) 08/01/2032    Flu vaccine  Completed    Shingles vaccine  Completed    Pneumococcal 65+ years Vaccine  Completed    AAA screen  Completed    Hepatitis C screen  Completed    Hepatitis A vaccine  Aged Out    Hepatitis B vaccine  Aged Out    Hib vaccine  Aged Out    Polio vaccine  Aged Out    Meningococcal (ACWY) vaccine  Aged Out    Pneumococcal 0-64 years Vaccine  Discontinued    HIV screen  Discontinued    Prostate Specific Antigen (PSA) Screening or Monitoring  Discontinued       Review of Systems  Constitutional:  Negative for activity change, appetite change, chills, diaphoresis, fatigue, fever and unexpected weight change.   HENT: Negative.  Negative for congestion, dental problem, drooling, ear discharge, ear pain, facial swelling, hearing loss, mouth  sores, nosebleeds, postnasal drip, rhinorrhea, sinus pressure, sinus pain, sneezing, sore throat, tinnitus, trouble swallowing and voice change.    Eyes:  Negative for  photophobia, pain, discharge, redness, itching and visual disturbance.   Respiratory:  Negative for apnea, cough, choking, chest tightness, shortness of breath, wheezing and stridor.    Cardiovascular:  Negative for chest pain, palpitations and leg swelling.   Gastrointestinal:  Negative for abdominal distention, abdominal pain, anal bleeding, blood in stool, constipation, diarrhea, nausea, rectal pain and vomiting.   Endocrine: Negative for cold intolerance, heat intolerance, polydipsia, polyphagia and polyuria.   Genitourinary:  Negative for decreased urine volume, difficulty urinating, dysuria, enuresis, flank pain, frequency, genital sores, hematuria, penile discharge, penile pain, penile swelling, scrotal swelling, testicular pain and urgency.   Musculoskeletal:  Positive for back pain and leg pain which he thinks is sciatica negative for arthralgias, back pain, gait problem, joint swelling, myalgias, neck pain and neck stiffness.   Skin:  Negative for color change, pallor, rash and wound.   Allergic/Immunologic: Negative for environmental allergies, food allergies and immunocompromised state.   Neurological:  Negative for dizziness, tremors, seizures, syncope, facial asymmetry, speech difficulty, weakness, light-headedness, numbness and headaches.   Hematological:  Negative for adenopathy. Does not bruise/bleed easily.   Psychiatric/Behavioral:  Negative for agitation, behavioral problems, confusion, decreased concentration, dysphoric mood, hallucinations, self-injury, sleep disturbance and suicidal ideas. The patient is not nervous/anxious and is not hyperactive    OBJECTIVE    VITALS:  height is 1.702 m (5\' 7" ) and weight is 98 kg (216 lb). His temporal temperature is 97.8 F (36.6 C). His blood pressure is 128/86 and his pulse is 68. His respiration is 18 and oxygen saturation is 97%.   CONSTITUTIONAL: Alert and oriented times 3, no acute distress and cooperative to examination with proper mood and  affect.  SKIN: Skin color, texture, turgor normal. No rashes or lesions.  LYMPH: no cervical nodes, no inguinal nodes  HEENT: Head is normocephalic, atraumatic. EOMI, PERRLA.  NECK: Supple, symmetrical, trachea midline, no adenopathy, thyroid symmetric, not enlarged and no tenderness, skin normal.  CHEST/LUNGS: chest symmetric with normal A/P diameter, normal respiratory rate and rhythm, lungs clear to auscultation without wheezes, rales or rhonchi. No accessory muscle use. Scars Median sternotomy   CARDIOVASCULAR: Heart sounds are normal.  Regular rate and rhythm without murmur, gallop or rub. Normal S1 and S2.. Carotid and femoral pulses 2+/4 and equal bilaterally.  ABDOMEN: Normal shape. No scar(s) present. Normal bowel sounds.  No bruits. . "soft, nontender, nondistended, no masses or organomegaly. no evidence of hernia. Percussion: Normal without hepatosplenomegally. Tenderness: absent.  RECTAL: deferred, not clinically indicated  NEUROLOGIC: There are no focalizing motor or sensory deficits. CN II-XII are grossly intact.[name]   EXTREMITIES: no cyanosis, no clubbing, and no edema.              Electronically signed by Florian Buff, MD on 11/09/2022 at 12:23 PM

## 2022-11-10 ENCOUNTER — Encounter

## 2022-11-10 MED ORDER — ONETOUCH DELICA PLUS LANCET33G MISC
0 refills | Status: AC
Start: 2022-11-10 — End: ?

## 2022-11-10 NOTE — Telephone Encounter (Signed)
Recent Visits  Date Type Provider Dept   08/02/22 Office Visit Jone Baseman, APRN - CNP [facility]   01/28/22 Office Visit Jone Baseman, APRN - CNP [facility]   10/22/21 Office Visit Jone Baseman, APRN - CNP [facility]   10/12/21 Office Visit Jone Baseman, APRN - CNP [facility]   Showing recent visits within past 540 days with a meds authorizing provider and meeting all other requirements  Future Appointments  Date Type Provider Dept   12/07/22 Appointment Jone Baseman, APRN - CNP [facility]   Showing future appointments within next 150 days with a meds authorizing provider and meeting all other requirements

## 2022-11-14 ENCOUNTER — Encounter

## 2022-11-14 MED ORDER — SYMBICORT 80-4.5 MCG/ACT IN AERO
Freq: Two times a day (BID) | RESPIRATORY_TRACT | 0 refills | Status: DC
Start: 2022-11-14 — End: 2022-12-07

## 2022-11-14 NOTE — Telephone Encounter (Signed)
Recent Visits  Date Type Provider Dept   08/02/22 Office Visit Jone Baseman, APRN - CNP [facility]   01/28/22 Office Visit Jone Baseman, APRN - CNP [facility]   10/22/21 Office Visit Jone Baseman, APRN - CNP [facility]   10/12/21 Office Visit Jone Baseman, APRN - CNP [facility]   Showing recent visits within past 540 days with a meds authorizing provider and meeting all other requirements  Future Appointments  Date Type Provider Dept   12/07/22 Appointment Jone Baseman, APRN - CNP [facility]   Showing future appointments within next 150 days with a meds authorizing provider and meeting all other requirements

## 2022-11-21 MED ORDER — ATORVASTATIN CALCIUM 80 MG PO TABS
80 MG | ORAL_TABLET | Freq: Every day | ORAL | 3 refills | Status: AC
Start: 2022-11-21 — End: 2023-01-13

## 2022-11-24 ENCOUNTER — Encounter

## 2022-11-24 MED ORDER — METOPROLOL SUCCINATE ER 25 MG PO TB24
25 MG | ORAL_TABLET | Freq: Every day | ORAL | 3 refills | Status: DC
Start: 2022-11-24 — End: 2022-12-07

## 2022-11-24 MED ORDER — OZEMPIC (0.25 OR 0.5 MG/DOSE) 2 MG/3ML SC SOPN
2 | SUBCUTANEOUS | 3 refills | Status: DC
Start: 2022-11-24 — End: 2022-11-28

## 2022-11-24 NOTE — Telephone Encounter (Signed)
 Recent Visits  Date Type Provider Dept   08/02/22 Office Visit Jone Baseman, APRN - CNP [facility]   01/28/22 Office Visit Jone Baseman, APRN - CNP [facility]   10/22/21 Office Visit Jone Baseman, APRN - CNP [facility]   10/12/21 Office Visit Jone Baseman, APRN - CNP [facility]   Showing recent visits within past 540 days with a meds authorizing provider and meeting all other requirements  Future Appointments  Date Type Provider Dept   12/07/22 Appointment Jone Baseman, APRN - CNP [facility]   Showing future appointments within next 150 days with a meds authorizing provider and meeting all other requirements

## 2022-11-26 ENCOUNTER — Encounter

## 2022-11-28 MED ORDER — OZEMPIC (0.25 OR 0.5 MG/DOSE) 2 MG/3ML SC SOPN
2 MG/3ML | SUBCUTANEOUS | 0 refills | Status: DC
Start: 2022-11-28 — End: 2022-12-07

## 2022-11-28 NOTE — Telephone Encounter (Signed)
 Recent Visits  Date Type Provider Dept   08/02/22 Office Visit Jone Baseman, APRN - CNP [facility]   01/28/22 Office Visit Jone Baseman, APRN - CNP [facility]   10/22/21 Office Visit Jone Baseman, APRN - CNP [facility]   10/12/21 Office Visit Jone Baseman, APRN - CNP [facility]   Showing recent visits within past 540 days with a meds authorizing provider and meeting all other requirements  Future Appointments  Date Type Provider Dept   12/07/22 Appointment Jone Baseman, APRN - CNP [facility]   Showing future appointments within next 150 days with a meds authorizing provider and meeting all other requirements

## 2022-12-01 ENCOUNTER — Encounter

## 2022-12-02 MED ORDER — OXYCODONE-ACETAMINOPHEN 10-325 MG PO TABS
10-325 MG | ORAL_TABLET | Freq: Three times a day (TID) | ORAL | 0 refills | Status: AC | PRN
Start: 2022-12-02 — End: 2023-01-01

## 2022-12-02 NOTE — Telephone Encounter (Signed)
I reviewed an OARRS report on patient today.  There were no abnormal findings on the report.  Rx sent  Electronically signed by Jone Baseman, APRN - CNP on 12/02/22 at 12:11 PM EST

## 2022-12-06 ENCOUNTER — Encounter

## 2022-12-07 ENCOUNTER — Ambulatory Visit: Admit: 2022-12-07 | Discharge: 2022-12-07 | Payer: MEDICARE | Attending: Family | Primary: Family

## 2022-12-07 DIAGNOSIS — E119 Type 2 diabetes mellitus without complications: Secondary | ICD-10-CM

## 2022-12-07 LAB — POCT MICROALBUMIN
MICROALBUMIN POC: 10 mg/L
POC Creatinine, Urine: 50 mg/dL

## 2022-12-07 LAB — POCT GLYCOSYLATED HEMOGLOBIN (HGB A1C): Hemoglobin A1C POC: 6 % — ABNORMAL HIGH (ref 4.3–5.7)

## 2022-12-07 MED ORDER — DIAZEPAM 5 MG PO TABS
5 MG | ORAL_TABLET | Freq: Three times a day (TID) | ORAL | 2 refills | Status: DC | PRN
Start: 2022-12-07 — End: 2023-04-25

## 2022-12-07 MED ORDER — SYMBICORT 80-4.5 MCG/ACT IN AERO
80-4.5 MCG/ACT | Freq: Two times a day (BID) | RESPIRATORY_TRACT | 0 refills | Status: DC
Start: 2022-12-07 — End: 2023-01-05

## 2022-12-07 MED ORDER — ISOSORBIDE MONONITRATE ER 30 MG PO TB24
30 MG | ORAL_TABLET | Freq: Every day | ORAL | 3 refills | Status: DC
Start: 2022-12-07 — End: 2023-03-14

## 2022-12-07 MED ORDER — PREGABALIN 150 MG PO CAPS
150 MG | ORAL_CAPSULE | Freq: Three times a day (TID) | ORAL | 1 refills | Status: DC
Start: 2022-12-07 — End: 2023-03-14

## 2022-12-07 MED ORDER — OLOPATADINE HCL 0.1 % OP SOLN
0.1 | Freq: Two times a day (BID) | OPHTHALMIC | 3 refills | Status: AC
Start: 2022-12-07 — End: ?

## 2022-12-07 MED ORDER — LISINOPRIL 40 MG PO TABS
40 MG | ORAL_TABLET | Freq: Every day | ORAL | 3 refills | Status: DC
Start: 2022-12-07 — End: 2023-05-29

## 2022-12-07 MED ORDER — ESOMEPRAZOLE MAGNESIUM 40 MG PO CPDR
40 | ORAL_CAPSULE | ORAL | 3 refills | 90.00 days | Status: DC
Start: 2022-12-07 — End: 2023-06-12

## 2022-12-07 MED ORDER — TICAGRELOR 90 MG PO TABS
90 MG | ORAL_TABLET | ORAL | 3 refills | Status: DC
Start: 2022-12-07 — End: 2023-07-03

## 2022-12-07 MED ORDER — METOPROLOL SUCCINATE ER 25 MG PO TB24
25 | ORAL_TABLET | Freq: Every day | ORAL | 3 refills | 52.50000 days | Status: DC
Start: 2022-12-07 — End: 2023-05-29

## 2022-12-07 MED ORDER — HYDROXYZINE HCL 25 MG PO TABS
25 | ORAL_TABLET | ORAL | 3 refills | Status: AC
Start: 2022-12-07 — End: ?

## 2022-12-07 MED ORDER — OZEMPIC (0.25 OR 0.5 MG/DOSE) 2 MG/3ML SC SOPN
2 MG/3ML | SUBCUTANEOUS | 3 refills | Status: DC
Start: 2022-12-07 — End: 2023-02-22

## 2022-12-07 MED ORDER — FENOFIBRATE 145 MG PO TABS
145 MG | ORAL_TABLET | Freq: Every day | ORAL | 3 refills | Status: DC
Start: 2022-12-07 — End: 2023-03-14

## 2022-12-07 MED ORDER — METFORMIN HCL 1000 MG PO TABS
1000 MG | ORAL_TABLET | Freq: Two times a day (BID) | ORAL | 3 refills | Status: DC
Start: 2022-12-07 — End: 2023-03-14

## 2022-12-07 MED ORDER — TAMSULOSIN HCL 0.4 MG PO CAPS
0.4 MG | ORAL_CAPSULE | Freq: Two times a day (BID) | ORAL | 3 refills | Status: DC
Start: 2022-12-07 — End: 2023-06-12

## 2022-12-07 MED ORDER — PAROXETINE HCL 40 MG PO TABS
40 MG | ORAL_TABLET | Freq: Every day | ORAL | 3 refills | Status: DC
Start: 2022-12-07 — End: 2023-07-03

## 2022-12-07 NOTE — Progress Notes (Signed)
 Medicare Annual Wellness Visit    Micheal [name] is here for Medicare AWV    Assessment & Plan   Type 2 diabetes mellitus without complication, without long-term current use of insulin (HCC)  -     POCT glycosylated hemoglobin (Hb A1C)  -     Microalbumin /

## 2022-12-07 NOTE — Patient Instructions (Signed)
 Learning About Dental Care for Older Adults  Dental care for older adults: Overview  Dental care for older people is much the same as for younger adults. But older adults do have concerns that younger adults do not. Older adults may have problems with

## 2022-12-07 NOTE — Progress Notes (Signed)
 Micheal [name] is a 67 y.o. male for Medicare AWV      Diabetes Type 2    Glucose control:   Does patient check blood glucoses at home?  Yes  Report of hypoglycemia: no  Lab Results   Component Value Date    LABA1C 6.0 (H) 12/07/2022     Lab Results   Compo

## 2022-12-07 NOTE — Telephone Encounter (Signed)
 Recent Visits  Date Type Provider Dept   08/02/22 Office Visit Jone Baseman, APRN - CNP [facility]   01/28/22 Office Visit Jone Baseman, APRN - CNP [facility]   10/22/21 Office Visit Jone Baseman, APRN - CNP [facility]

## 2022-12-08 NOTE — Telephone Encounter (Signed)
 Per [facility] Medicare CPT code 29562 authorized.  Auth# [id number]

## 2022-12-19 ENCOUNTER — Encounter: Admit: 2022-12-19 | Payer: MEDICARE | Admitting: Cardiovascular Disease | Primary: Family

## 2022-12-19 VITALS — BP 134/72 | HR 80 | Ht 67.0 in | Wt 217.0 lb

## 2022-12-19 DIAGNOSIS — I25810 Atherosclerosis of coronary artery bypass graft(s) without angina pectoris: Secondary | ICD-10-CM

## 2022-12-19 NOTE — Progress Notes (Signed)
 [facility] [place] [facility]  [facility]  [address]  [address]  [place] [address]  Dept: [phone]  Dept Fax: [phone]  Loc: [phone]    Visit Date: 12/19/2022    TERRENCE WISHON is a 67 y.o. male who present

## 2022-12-19 NOTE — Progress Notes (Signed)
 Patient here for follow up.  EKG done today.  Patient didn't bring medication list or bottles.  Patient states medications haven't changed since last reviewed on 12-07-22 by PCP.

## 2022-12-20 NOTE — Telephone Encounter (Signed)
 Patient advised of Colonoscopy arrival time change.  Patient to be at Endo at 8:00am for a 9:00am colonoscopy with Dr. Willette Pa on 12-30-2022.  Patient voiced understanding.

## 2022-12-26 NOTE — Telephone Encounter (Signed)
 Patient called wanting to reschedule his colonoscopy with Dr. Willette Pa do to him being sick.  Patient rescheduled to 01-10-2023 at 8:30am with an arrival of 7:30am.  Per the patient's request new instructions put in the mail.

## 2022-12-30 ENCOUNTER — Encounter: Admit: 2022-12-30 | Admitting: Family

## 2022-12-30 DIAGNOSIS — G8929 Other chronic pain: Secondary | ICD-10-CM

## 2022-12-30 DIAGNOSIS — M5441 Lumbago with sciatica, right side: Secondary | ICD-10-CM

## 2022-12-30 DIAGNOSIS — M5442 Lumbago with sciatica, left side: Secondary | ICD-10-CM

## 2022-12-30 MED ORDER — OXYCODONE-ACETAMINOPHEN 10-325 MG PO TABS
10-325 | ORAL_TABLET | Freq: Three times a day (TID) | ORAL | 0 refills | Status: AC | PRN
Start: 2022-12-30 — End: 2023-01-29

## 2022-12-30 NOTE — Telephone Encounter (Signed)
 Recent Visits  Date Type Provider Dept   12/07/22 Office Visit Jone Baseman, APRN - CNP [facility]   08/02/22 Office Visit Jone Baseman, APRN - CNP [facility]   01/28/22 Office Visit Jone Baseman, APRN - CNP [facility]

## 2022-12-30 NOTE — Telephone Encounter (Signed)
 I reviewed an OARRS report on patient today.  There were no abnormal findings on the report.  Rx sent  Electronically signed by Jone Baseman, APRN - CNP on 12/30/22 at 11:31 AM EST

## 2022-12-31 ENCOUNTER — Encounter: Admit: 2022-12-31 | Admitting: Family

## 2022-12-31 DIAGNOSIS — J3089 Other allergic rhinitis: Secondary | ICD-10-CM

## 2023-01-02 MED ORDER — FLUTICASONE PROPIONATE 50 MCG/ACT NA SUSP
50 | NASAL | 0 refills | 60.00000 days | Status: DC
Start: 2023-01-02 — End: 2023-06-29

## 2023-01-02 NOTE — Telephone Encounter (Signed)
 Recent Visits  Date Type Provider Dept   12/07/22 Office Visit Jone Baseman, APRN - CNP [facility]   08/02/22 Office Visit Jone Baseman, APRN - CNP [facility]   01/28/22 Office Visit Jone Baseman, APRN - CNP [facility]

## 2023-01-05 ENCOUNTER — Encounter

## 2023-01-05 MED ORDER — SYMBICORT 80-4.5 MCG/ACT IN AERO
80-4.5 | Freq: Two times a day (BID) | RESPIRATORY_TRACT | 11 refills | Status: AC
Start: 2023-01-05 — End: ?

## 2023-01-05 NOTE — Telephone Encounter (Signed)
Recent Visits  Date Type Provider Dept   12/07/22 Office Visit Jone Baseman, APRN - CNP [facility]   08/02/22 Office Visit Jone Baseman, APRN - CNP [facility]   01/28/22 Office Visit Jone Baseman, APRN - CNP [facility]   10/22/21 Office Visit Jone Baseman, APRN - CNP [facility]   10/12/21 Office Visit Jone Baseman, APRN - CNP [facility]   Showing recent visits within past 540 days with a meds authorizing provider and meeting all other requirements  Future Appointments  No visits were found meeting these conditions.  Showing future appointments within next 150 days with a meds authorizing provider and meeting all other requirements

## 2023-01-06 ENCOUNTER — Inpatient Hospital Stay: Payer: MEDICARE | Primary: Family

## 2023-01-06 DIAGNOSIS — I10 Essential (primary) hypertension: Secondary | ICD-10-CM

## 2023-01-06 DIAGNOSIS — E119 Type 2 diabetes mellitus without complications: Secondary | ICD-10-CM

## 2023-01-06 LAB — CBC WITH AUTO DIFFERENTIAL
Basophils Absolute: 0 10*3/uL (ref 0.0–0.1)
Basophils: 0.8 %
Eosinophils Absolute: 0.1 10*3/uL (ref 0.0–0.4)
Eosinophils: 2.5 %
Hematocrit: 43 % (ref 42.0–52.0)
Hemoglobin: 13.6 g/dL — ABNORMAL LOW (ref 14.0–18.0)
Immature Grans (Abs): 0.04 10*3/uL (ref 0.00–0.07)
Immature Granulocytes %: 0.8 %
Lymphocytes Absolute: 1.9 10*3/uL (ref 1.0–4.8)
Lymphocytes: 36.5 %
MCH: 27.8 pg (ref 26.0–33.0)
MCHC: 31.6 g/dL — ABNORMAL LOW (ref 32.2–35.5)
MCV: 87.8 fL (ref 80.0–94.0)
MPV: 10.8 fL (ref 9.4–12.4)
Monocytes %: 8.9 %
Monocytes Absolute: 0.5 10*3/uL (ref 0.4–1.3)
Neutrophils Absolute: 2.6 10*3/uL (ref 1.8–7.7)
Platelets: 258 10*3/uL (ref 130–400)
RBC: 4.9 10*6/uL (ref 4.70–6.10)
RDW-CV: 14.8 % — ABNORMAL HIGH (ref 11.5–14.5)
RDW-SD: 47.3 fL — ABNORMAL HIGH (ref 35.0–45.0)
Seg Neutrophils: 50.5 %
WBC: 5.2 10*3/uL (ref 4.8–10.8)
nRBC: 0 /100{WBCs}

## 2023-01-06 LAB — COMPREHENSIVE METABOLIC PANEL
ALT: 31 U/L (ref 11–66)
AST: 21 U/L (ref 5–40)
Albumin: 4.4 g/dL (ref 3.5–5.1)
Alkaline Phosphatase: 30 U/L — ABNORMAL LOW (ref 38–126)
BUN: 17 mg/dL (ref 7–22)
CO2: 24 meq/L (ref 23–33)
Calcium: 9.6 mg/dL (ref 8.5–10.5)
Chloride: 103 meq/L (ref 98–111)
Creatinine: 1.2 mg/dL (ref 0.4–1.2)
Glucose: 133 mg/dL — ABNORMAL HIGH (ref 70–108)
Potassium: 4.8 meq/L (ref 3.5–5.2)
Sodium: 140 meq/L (ref 135–145)
Total Bilirubin: 0.4 mg/dL (ref 0.3–1.2)
Total Protein: 8.1 g/dL — ABNORMAL HIGH (ref 6.1–8.0)

## 2023-01-06 LAB — LIPID PANEL
Cholesterol, Total: 205 mg/dL — ABNORMAL HIGH (ref 100–199)
HDL: 34 mg/dL
LDL Cholesterol: 142 mg/dL
Triglycerides: 144 mg/dL (ref 0–199)

## 2023-01-06 LAB — PSA PROSTATIC SPECIFIC ANTIGEN: PSA: 3.38 ng/mL — ABNORMAL HIGH (ref 0.00–1.00)

## 2023-01-06 LAB — TSH: TSH: 2.24 u[IU]/mL (ref 0.400–4.200)

## 2023-01-06 LAB — MICROALBUMIN / CREATININE URINE RATIO
Creatinine, Ur: 73.6 mg/dL
Microalb, Ur: <1.2 mg/dL
Microalb/Creat Ratio: 16 mg/g (ref 0–30)

## 2023-01-06 LAB — GLOMERULAR FILTRATION RATE, ESTIMATED: Est, Glom Filt Rate: 66 mL/min/{1.73_m2} (ref >60)

## 2023-01-06 LAB — T4, FREE: T4 Free: 1.07 ng/dL (ref 0.93–1.68)

## 2023-01-06 LAB — ANION GAP: Anion Gap: 13 meq/L (ref 8.0–16.0)

## 2023-01-07 NOTE — H&P (Signed)
 H and P for Surgery           [facility]        Florian Buff MD FACS  General Surgery  New Patient Evaluation in Office  Pt Name: Micheal [name]  Date of Birth 1955/06/15   Today's Date: 01/07/2023  Medical Record Number: [id number]  Referring Pro

## 2023-01-10 ENCOUNTER — Encounter: Admit: 2023-01-10 | Admitting: Family

## 2023-01-10 ENCOUNTER — Inpatient Hospital Stay
Admit: 2023-01-10 | Discharge: 2023-01-10 | Disposition: A | Discharge status: Home / Self Care | Payer: Medicare (Managed Care) | Attending: Surgery | Admitting: Surgery

## 2023-01-10 ENCOUNTER — Encounter: Admit: 2023-01-10

## 2023-01-10 DIAGNOSIS — R972 Elevated prostate specific antigen [PSA]: Secondary | ICD-10-CM

## 2023-01-10 DIAGNOSIS — Z1211 Encounter for screening for malignant neoplasm of colon: Secondary | ICD-10-CM

## 2023-01-10 DIAGNOSIS — Z860101 Personal history of adenomatous and serrated colon polyps: Secondary | ICD-10-CM

## 2023-01-10 LAB — POCT GLUCOSE: POC Glucose: 133 mg/dL — ABNORMAL HIGH (ref 70–108)

## 2023-01-10 MED ORDER — PROPOFOL 200 MG/20ML IV EMUL
200 | Freq: Once | INTRAVENOUS | Status: DC | PRN
Start: 2023-01-10 — End: 2023-01-10
  Administered 2023-01-10: 14:00:00 130 via INTRAVENOUS

## 2023-01-10 MED ORDER — NORMAL SALINE FLUSH 0.9 % IV SOLN
0.9 | INTRAVENOUS | Status: DC | PRN
Start: 2023-01-10 — End: 2023-01-10

## 2023-01-10 MED ORDER — SODIUM CHLORIDE 0.9 % IV SOLN
0.9 | INTRAVENOUS | Status: DC | PRN
Start: 2023-01-10 — End: 2023-01-10

## 2023-01-10 MED ORDER — LIDOCAINE HCL (PF) 2 % IJ SOLN
2 | Freq: Once | INTRAMUSCULAR | Status: DC | PRN
Start: 2023-01-10 — End: 2023-01-10
  Administered 2023-01-10: 14:00:00 60 via INTRAVENOUS

## 2023-01-10 MED ORDER — NORMAL SALINE FLUSH 0.9 % IV SOLN
0.9 | Freq: Two times a day (BID) | INTRAVENOUS | Status: DC
Start: 2023-01-10 — End: 2023-01-10

## 2023-01-10 MED ORDER — SODIUM CHLORIDE 0.9 % IV SOLN
0.9 | INTRAVENOUS | Status: DC
Start: 2023-01-10 — End: 2023-01-10
  Administered 2023-01-10: 13:00:00 via INTRAVENOUS

## 2023-01-10 NOTE — Telephone Encounter (Signed)
 Pt informed of lab results. Pt voiced understanding with no further questions at this time.       Lab slip in the mail.

## 2023-01-10 NOTE — H&P (Signed)
 H and P for Surgery           [facility]  History and Physical Update    Pt Name: Micheal [name]  MRN: [id number]  Birthdate: 12/29/1955  Date of evaluation: 01/10/2023    [x]  I have examined the patient and reviewed the

## 2023-01-10 NOTE — Telephone Encounter (Signed)
-----   Message from Arlee Muslim, APRN - CNP sent at 01/10/2023  3:55 PM EST -----  PSA slightly elevated.  Recommend repeating in 1 mos.  Will place an order for this.      Lipid panel revealed elevated LDL (bad fat).  I recommend following a low fat d

## 2023-01-10 NOTE — Anesthesia Post-Procedure Evaluation (Signed)
 Department of Anesthesiology  Postprocedure Note    Patient: Micheal [name]  MRN: [id number]  Birthdate: [birth year]  Date of evaluation: 01/10/2023    Procedure Summary       Date: 01/10/23 Room / Location: [facility] 11 / [facility]

## 2023-01-10 NOTE — Progress Notes (Signed)
 Scope # [id number].  Colonoscopy completed, tolerated well. No polyps removed. Photos taken.

## 2023-01-10 NOTE — Progress Notes (Signed)
 Recovery mode, pt denies discomfort. Passing gas, taking in fluids. Dr. discussed findings with pt and responsible party. Discharge instructions provided and understanding verbalized.

## 2023-01-10 NOTE — Op Note (Signed)
 [facility]  Operative Report    PATIENT NAME: Micheal [name]  MEDICAL RECORD NO. [id number]  SURGEON: Florian Buff, MD MD FACS  Primary Care Physician: Jone Baseman, APRN - CNP  Date: 01/10/2023, 9:08 AM     PROCEDURE PERFORMED: Co

## 2023-01-10 NOTE — Discharge Instructions (Addendum)
 Discharge Instructions  Colonoscopy       Colonoscopy is a visual exam of the lining of the large intestine, also called the bowel or colon, with a colonoscope.  A colonoscope is a flexible tube with a light and a viewing device.  It allows the doctor to v

## 2023-01-10 NOTE — Anesthesia Pre-Procedure Evaluation (Signed)
 Department of Anesthesiology  Preprocedure Note       Name:  Micheal [name]   Age:  67 y.o.  DOB:  13-Jan-1956                                          MRN:  [id number]         Date:  01/10/2023      Surgeon: Moishe Spice):  Florian Buff, MD    Procedure: Demetrius Charity

## 2023-01-10 NOTE — Progress Notes (Signed)
 admitted to Endo department and admitted to Endo room 12  Plan of care reviewed with patient.   Call light within reach.   Allergies reviewed with patient  Bed in lowest position, locked, with one bed rail up.   Appropriate arm bands on patient.   Bathroom

## 2023-01-13 ENCOUNTER — Encounter: Admit: 2023-01-13 | Admitting: Family

## 2023-01-13 DIAGNOSIS — E119 Type 2 diabetes mellitus without complications: Secondary | ICD-10-CM

## 2023-01-13 MED ORDER — ATORVASTATIN CALCIUM 80 MG PO TABS
80 MG | ORAL_TABLET | Freq: Every day | ORAL | 3 refills | Status: DC
Start: 2023-01-13 — End: 2023-03-14

## 2023-01-16 ENCOUNTER — Encounter: Admit: 2023-01-16 | Admitting: Family

## 2023-01-16 DIAGNOSIS — J3089 Other allergic rhinitis: Secondary | ICD-10-CM

## 2023-01-27 ENCOUNTER — Encounter: Admit: 2023-01-27 | Admitting: Family

## 2023-01-27 DIAGNOSIS — G8929 Other chronic pain: Secondary | ICD-10-CM

## 2023-01-27 DIAGNOSIS — M5441 Lumbago with sciatica, right side: Secondary | ICD-10-CM

## 2023-01-27 DIAGNOSIS — M5442 Lumbago with sciatica, left side: Secondary | ICD-10-CM

## 2023-01-27 MED ORDER — OXYCODONE-ACETAMINOPHEN 10-325 MG PO TABS
10-325 | ORAL_TABLET | Freq: Three times a day (TID) | ORAL | 0 refills | Status: DC | PRN
Start: 2023-01-27 — End: 2023-02-28

## 2023-01-27 NOTE — Telephone Encounter (Signed)
 I reviewed an OARRS report on patient today.  There were no abnormal findings on the report.  Rx sent  Electronically signed by Jone Baseman, APRN - CNP on 01/27/23 at 12:59 PM EST

## 2023-01-27 NOTE — Telephone Encounter (Signed)
 Recent Visits  Date Type Provider Dept   12/07/22 Office Visit Antonetta Delon BRAVO, APRN - CNP [facility]   08/02/22 Office Visit Antonetta Delon BRAVO, APRN - CNP [facility]   01/28/22 Office Visit Antonetta Delon BRAVO, APRN - CNP [facility]   10/22/21 Office Visit Antonetta Delon BRAVO, APRN - CNP [facility]   10/12/21 Office Visit Antonetta Delon BRAVO, APRN - CNP [facility]   Showing recent visits within past 540 days with a meds authorizing provider and meeting all other requirements  Future Appointments  No visits were found meeting these conditions.  Showing future appointments within next 150 days with a meds authorizing provider and meeting all other requirements         Pended medication please review

## 2023-02-20 ENCOUNTER — Encounter

## 2023-02-20 MED ORDER — EQ ALLERGY RELIEF (CETIRIZINE) 10 MG PO TABS
10 | ORAL_TABLET | Freq: Every morning | ORAL | 3 refills | Status: AC
Start: 2023-02-20 — End: ?

## 2023-02-20 NOTE — Telephone Encounter (Signed)
Recent Visits  Date Type Provider Dept   12/07/22 Office Visit Jone Baseman, APRN - CNP [facility]   08/02/22 Office Visit Jone Baseman, APRN - CNP [facility]   01/28/22 Office Visit Jone Baseman, APRN - CNP [facility]   10/22/21 Office Visit Jone Baseman, APRN - CNP [facility]   10/12/21 Office Visit Jone Baseman, APRN - CNP [facility]   Showing recent visits within past 540 days with a meds authorizing provider and meeting all other requirements  Future Appointments  No visits were found meeting these conditions.  Showing future appointments within next 150 days with a meds authorizing provider and meeting all other requirements

## 2023-02-22 ENCOUNTER — Encounter

## 2023-02-22 MED ORDER — OZEMPIC (0.25 OR 0.5 MG/DOSE) 2 MG/3ML SC SOPN
2 | SUBCUTANEOUS | 3 refills | Status: AC
Start: 2023-02-22 — End: ?

## 2023-02-22 NOTE — Telephone Encounter (Signed)
Recent Visits  Date Type Provider Dept   12/07/22 Office Visit Jone Baseman, APRN - CNP [facility]   08/02/22 Office Visit Jone Baseman, APRN - CNP [facility]   01/28/22 Office Visit Jone Baseman, APRN - CNP [facility]   10/22/21 Office Visit Jone Baseman, APRN - CNP [facility]   10/12/21 Office Visit Jone Baseman, APRN - CNP [facility]   Showing recent visits within past 540 days with a meds authorizing provider and meeting all other requirements  Future Appointments  No visits were found meeting these conditions.  Showing future appointments within next 150 days with a meds authorizing provider and meeting all other requirements

## 2023-02-28 ENCOUNTER — Encounter

## 2023-02-28 MED ORDER — OXYCODONE-ACETAMINOPHEN 10-325 MG PO TABS
10-325 | ORAL_TABLET | Freq: Three times a day (TID) | ORAL | 0 refills | Status: AC | PRN
Start: 2023-02-28 — End: 2023-03-30

## 2023-02-28 NOTE — Telephone Encounter (Signed)
I reviewed an OARRS report on patient today.  There were no abnormal findings on the report.  Rx sent  Electronically signed by Jone Baseman, APRN - CNP on 02/28/23 at 12:46 PM EST

## 2023-02-28 NOTE — Telephone Encounter (Signed)
 Recent Visits  Date Type Provider Dept   12/07/22 Office Visit Jone Baseman, APRN - CNP [facility]   08/02/22 Office Visit Jone Baseman, APRN - CNP [facility]   01/28/22 Office Visit Jone Baseman, APRN - CNP [facility]   10/22/21 Office Visit Jone Baseman, APRN - CNP [facility]   10/12/21 Office Visit Jone Baseman, APRN - CNP [facility]   Showing recent visits within past 540 days with a meds authorizing provider and meeting all other requirements  Future Appointments  No visits were found meeting these conditions.  Showing future appointments within next 150 days with a meds authorizing provider and meeting all other requirements

## 2023-03-13 ENCOUNTER — Encounter

## 2023-03-13 NOTE — Telephone Encounter (Signed)
 Recent Visits  Date Type Provider Dept   12/07/22 Office Visit Jone Baseman, APRN - CNP [facility]   08/02/22 Office Visit Jone Baseman, APRN - CNP [facility]   01/28/22 Office Visit Jone Baseman, APRN - CNP [facility]   10/22/21 Office Visit Jone Baseman, APRN - CNP [facility]   10/12/21 Office Visit Jone Baseman, APRN - CNP [facility]   Showing recent visits within past 540 days with a meds authorizing provider and meeting all other requirements  Future Appointments  No visits were found meeting these conditions.  Showing future appointments within next 150 days with a meds authorizing provider and meeting all other requirements

## 2023-03-14 MED ORDER — FENOFIBRATE 145 MG PO TABS
145 MG | ORAL_TABLET | Freq: Every day | ORAL | 3 refills | Status: DC
Start: 2023-03-14 — End: 2023-05-10

## 2023-03-14 MED ORDER — PREGABALIN 150 MG PO CAPS
150 | ORAL_CAPSULE | ORAL | 1 refills | Status: DC
Start: 2023-03-14 — End: 2023-10-05

## 2023-03-14 MED ORDER — METFORMIN HCL 1000 MG PO TABS
1000 | ORAL_TABLET | Freq: Two times a day (BID) | ORAL | 3 refills | Status: AC
Start: 2023-03-14 — End: 2024-01-08

## 2023-03-14 MED ORDER — ATORVASTATIN CALCIUM 80 MG PO TABS
80 | ORAL_TABLET | Freq: Every day | ORAL | 3 refills | Status: AC
Start: 2023-03-14 — End: 2024-01-08

## 2023-03-14 MED ORDER — ISOSORBIDE MONONITRATE ER 30 MG PO TB24
30 | ORAL_TABLET | Freq: Every day | ORAL | 3 refills | Status: AC
Start: 2023-03-14 — End: 2024-01-08

## 2023-03-14 NOTE — Telephone Encounter (Signed)
 I reviewed an OARRS report on patient today.  There were no abnormal findings on the report.  Rx sent  Electronically signed by Jone Baseman, APRN - CNP on 03/14/23 at 7:06 AM EST

## 2023-03-27 ENCOUNTER — Encounter

## 2023-03-27 MED ORDER — OXYCODONE-ACETAMINOPHEN 10-325 MG PO TABS
10-325 | ORAL_TABLET | Freq: Three times a day (TID) | ORAL | 0 refills | Status: DC | PRN
Start: 2023-03-27 — End: 2023-04-25

## 2023-03-27 NOTE — Telephone Encounter (Signed)
 Recent Visits  Date Type Provider Dept   12/07/22 Office Visit Jone Baseman, APRN - CNP [facility]   08/02/22 Office Visit Jone Baseman, APRN - CNP [facility]   01/28/22 Office Visit Jone Baseman, APRN - CNP [facility]   10/22/21 Office Visit Jone Baseman, APRN - CNP [facility]   10/12/21 Office Visit Jone Baseman, APRN - CNP [facility]   Showing recent visits within past 540 days with a meds authorizing provider and meeting all other requirements  Future Appointments  No visits were found meeting these conditions.  Showing future appointments within next 150 days with a meds authorizing provider and meeting all other requirements

## 2023-04-04 ENCOUNTER — Inpatient Hospital Stay: Payer: MEDICARE | Primary: Family

## 2023-04-04 DIAGNOSIS — R972 Elevated prostate specific antigen [PSA]: Secondary | ICD-10-CM

## 2023-04-04 LAB — PSA PROSTATIC SPECIFIC ANTIGEN: PSA: 7.41 ng/mL — ABNORMAL HIGH (ref 0.00–1.00)

## 2023-04-05 ENCOUNTER — Telehealth

## 2023-04-05 NOTE — Telephone Encounter (Signed)
 Referral placed to our Urology

## 2023-04-05 NOTE — Telephone Encounter (Signed)
 Pt informed and verbalized understanding

## 2023-04-05 NOTE — Telephone Encounter (Signed)
 Pt informed and verbalized understanding.     Pt does not have a preference on provider

## 2023-04-05 NOTE — Telephone Encounter (Signed)
-----   Message from Arlee Muslim, APRN - CNP sent at 04/04/2023  5:04 PM EDT -----  PSA has increased.  Recommend referral to Urology.  Does he have a preference on who he sees?

## 2023-04-05 NOTE — Telephone Encounter (Signed)
 Left message on answering machine requesting pt to call back at earliest convenience.

## 2023-04-05 NOTE — Telephone Encounter (Signed)
 Jone Baseman, APRN - CNP  Graylin Shiver, MA    PSA has increased.  Recommend referral to Urology.  Does he have a preference on who he sees?

## 2023-04-05 NOTE — Telephone Encounter (Signed)
 Left message on answering machine requesting pt to call back at earliest convenience.     See other tele encounter 04/05/23

## 2023-04-06 ENCOUNTER — Ambulatory Visit: Admit: 2023-04-06 | Discharge: 2023-04-06 | Payer: MEDICARE | Attending: Family | Primary: Family

## 2023-04-06 VITALS — BP 126/82 | HR 72 | Temp 97.80000°F | Resp 16 | Ht 67.0 in | Wt 211.0 lb

## 2023-04-06 DIAGNOSIS — E119 Type 2 diabetes mellitus without complications: Secondary | ICD-10-CM

## 2023-04-06 LAB — POCT GLYCOSYLATED HEMOGLOBIN (HGB A1C)
Hemoglobin A1C POC: 7 % — ABNORMAL HIGH (ref 4.3–5.7)
Hemoglobin A1C: 7 %

## 2023-04-06 MED ORDER — SEMAGLUTIDE (1 MG/DOSE) 2 MG/1.5ML SC SOPN
2 | SUBCUTANEOUS | 5 refills | Status: AC
Start: 2023-04-06 — End: ?

## 2023-04-06 MED ORDER — CETIRIZINE HCL 10 MG PO TABS
10 | ORAL_TABLET | Freq: Every day | ORAL | 3 refills | Status: AC
Start: 2023-04-06 — End: ?

## 2023-04-06 MED ORDER — PREDNISONE 20 MG PO TABS
20 | ORAL_TABLET | Freq: Every day | ORAL | 0 refills | Status: AC
Start: 2023-04-06 — End: 2023-04-11

## 2023-04-06 MED ORDER — METHYLPREDNISOLONE ACETATE 80 MG/ML IJ SUSP
80 | Freq: Once | INTRAMUSCULAR | Status: AC
Start: 2023-04-06 — End: 2023-04-06
  Administered 2023-04-06: 17:00:00 60 mg via INTRAMUSCULAR

## 2023-04-06 NOTE — Progress Notes (Signed)
 Micheal [name] is a 68 y.o. male thatpresents for Follow-up and Diabetes      History obtained today from Patient.    History of Present Illness  The patient presents for evaluation of diabetes, elevated PSA, dry eyes, rhinorrhea, and sciatica.    He expresses a desire to discontinue several of his medications, citing an excessive regimen. He reports that Ozempic appears ineffective in managing his blood glucose levels. His dietary habits include a wide range of foods, with a recent increase in the consumption of [place] Dry ginger ale soda, although he maintains a low sugar intake. He admits to a high carbohydrate diet, including frequent consumption of fried chicken, McDonald's, [place] fries, and fish sandwiches with buns. He has experienced a weight loss of approximately 20 pounds since starting Ozempic. He reports no ankle swelling. He tolerates the Ozempic injections well, with no reported side effects such as nausea.    He is currently under the care of a urologist due to an elevated PSA level. He reports experiencing prostate-related issues.    He has been diagnosed with dry eyes and requires a refill of his olopatadine eye drops, which are no longer covered by his insurance. He experiences symptoms similar to a cold, including crusty and hard discharge, and occasionally feels as though there is a foreign body in his eye. He does not use regular saline or lubricating drops.    He reports persistent rhinorrhea, which he has been managing with tissues. He has not been taking Claritin or Zyrtec, as they were not provided by his pharmacy. He uses a nasal spray but continues to experience nasal discharge. He reports no seasonal allergies and has been dealing with the rhinorrhea for approximately 8 months.    He reports severe sciatic nerve pain, rating it as an 8 on a scale of 1 to 10. He has not sought treatment for this condition and has not received steroid injections. He was previously prescribed hydroxyzine,  which initially provided relief, but has become less effective as the pain has worsened. The pain is so severe that it prevents him from rising in the morning. He occasionally experiences numbness and tingling in his legs, primarily on the right side. He reports no new incontinence of stool or urine associated with the back pain.    Supplemental Information  He underwent a colonoscopy approximately 4 to 5 months ago, which revealed no polyps.    ALLERGIES  The patient has an allergy to GABAPENTIN.    MEDICATIONS  Current: Ozempic, olopatadine, hydroxyzine        I have reviewed the patient's past medical history, past surgical history, allergies,medications, social and family history and I have made updates where appropriate.    Past Medical History:   Diagnosis Date    Arthritis     general    CAD (coronary artery disease)     s/p stent placement-sees Dr. Gabrielle Dare    Chronic back pain     Diabetes mellitus (HCC)     GERD (gastroesophageal reflux disease)     Hyperlipidemia     Hypertension        Social History     Tobacco Use    Smoking status: Former     Current packs/day: 0.00     Average packs/day: 1 pack/day for 15.0 years (15.0 ttl pk-yrs)     Types: Cigarettes     Start date: 05/24/1993     Quit date: 05/24/2008     Years since quitting:  14.8    Smokeless tobacco: Never   Vaping Use    Vaping status: Never Used   Substance Use Topics    Alcohol use: Yes     Alcohol/week: 1.0 standard drink of alcohol     Types: 1 Glasses of wine per week     Comment: once monthly stopped a over 2 years    Drug use: No       Family History   Problem Relation Age of Onset    Cancer Mother     Heart Disease Brother     Stroke Brother     Diabetes Brother     Early Death Brother     High Blood Pressure Brother     High Cholesterol Brother     Mental Retardation Sister          Review of Systems        PHYSICAL EXAM:  BP 126/82   Pulse 72   Temp 97.8 F (36.6 C) (Oral)   Resp 16   Ht 1.702 m (5\' 7" )   Wt 95.7 kg (211 lb)   SpO2  96%   BMI 33.05 kg/m     Physical Exam  Constitutional:       Appearance: Normal appearance.   HENT:      Head: Normocephalic and atraumatic.      Right Ear: External ear normal.      Left Ear: External ear normal.      Nose: Nose normal.      Mouth/Throat:      Mouth: Mucous membranes are moist.   Eyes:      Conjunctiva/sclera: Conjunctivae normal.   Cardiovascular:      Rate and Rhythm: Normal rate and regular rhythm.      Pulses: Normal pulses.      Heart sounds: Normal heart sounds.   Pulmonary:      Effort: Pulmonary effort is normal.      Breath sounds: Normal breath sounds.   Abdominal:      General: Bowel sounds are normal.      Palpations: Abdomen is soft.   Musculoskeletal:         General: Normal range of motion.      Cervical back: Normal range of motion.   Skin:     General: Skin is warm and dry.   Neurological:      General: No focal deficit present.      Mental Status: He is alert and oriented to person, place, and time.   Psychiatric:         Mood and Affect: Mood normal.         Behavior: Behavior normal.           ASSESSMENT & PLAN  Romuald was seen today for follow-up and diabetes.    Diagnoses and all orders for this visit:    Type 2 diabetes mellitus without complication, without long-term current use of insulin (HCC)  -     POCT glycosylated hemoglobin (Hb A1C)  -     Semaglutide, 1 MG/DOSE, 2 MG/1.5ML SOPN; Inject 1 mg into the skin once a week    Seasonal allergies  -     cetirizine (ZYRTEC) 10 MG tablet; Take 1 tablet by mouth daily    Sedative, hypnotic or anxiolytic dependence, uncomplicated (HCC)    Congestive heart failure, unspecified HF chronicity, unspecified heart failure type (HCC)    Chronic bilateral low back pain with bilateral sciatica  -  methylPREDNISolone acetate (DEPO-MEDROL) injection 60 mg  -     predniSONE (DELTASONE) 20 MG tablet; Take 1 tablet by mouth daily for 5 days Start tomorrow morning with food    Other orders  -     POCT glycosylated hemoglobin (Hb  A1C)    Discussed diet changes, pt verbalized understanding  RTO in 4-6 weeks    No follow-ups on file.    Start above treatments    All copied or forwarded information in the progress note was verified by me to be accurate at the time of visit  Patient's past medical, surgical, social and family history were reviewed and updated     All patient questions answered.  Patient voiced understanding.     The patient (or guardian, if applicable) and other individuals in attendance with the patient were advised that Artificial Intelligence will be utilized during this visit to record, process the conversation to generate a clinical note, and support improvement of the AI technology. The patient (or guardian, if applicable) and other individuals in attendance at the appointment consented to the use of AI, including the recording.

## 2023-04-10 ENCOUNTER — Encounter

## 2023-04-11 ENCOUNTER — Ambulatory Visit: Admit: 2023-04-11 | Discharge: 2023-04-11 | Payer: MEDICARE | Attending: Urology | Primary: Family

## 2023-04-11 VITALS — BP 116/78 | HR 79 | Ht 67.0 in | Wt 207.0 lb

## 2023-04-11 DIAGNOSIS — N401 Enlarged prostate with lower urinary tract symptoms: Secondary | ICD-10-CM

## 2023-04-11 NOTE — Progress Notes (Signed)
 Harless Litten MD.    [facility] [place] [facility]  [facility]  [id number]  [address]  [place] [address]  Dept: [phone]  Dept Fax: [phone]  Loc: [phone]    [facility] Office Note -     Patient:  Micheal [name]  Date of Birth: 01/08/1956    The patient is a 68 y.o. male who presents today for evaluation of the following problems:   Chief Complaint   Patient presents with    New Patient     Elevated PSA    referred/consultation requested by Jone Baseman, APRN - CNP.    History of Present Illness:    Elevated PSA  Persistently rising    BPH  On flomax  Nocturia    ED  Has discussed trimix in past  On nitrates        Summary of Previous Records:  NIKOLAUS PIENTA is a 68 y.o. with past medical history of HTN, HLD, GERD who follows up today for Erectile Dysfunction and BPH with LUTS.  Yehonatan was last seen in October.  At that time he reported troubles both getting and keeping an erection.  He had not tried any medications.  He is on Imdur daily and nitro as needed. He also complained of urinary frequency and nocturia.  At that time he was started on Flomax daily for his urinary symptoms.  We reached out to cardiology regarding oral options for ED.  Called patient and told him his only option would be to try Trimix injections.  He has not called back with a decision.  He follows up today for symptom recheck with a PVR.    Today, Espen reports his urinary symptoms have improved with Flomax.  He is only getting up a couple times a week maybe 1 time a night to urinate.  He is happy with his symptom control and would like to continue the Flomax.  He is still hesitant about trying Trimix injections.  His urine was negative for signs of infection.  His PVR was 0 mL.  He comes in alone today.  History was obtained from patient and medical records.      Requested/reviewed records from Jone Baseman, APRN - CNP office and/or outside physician/EMR    (Patient's old  records have been requested, reviewed and pertinent findings summarized in today's note.)    Procedures Today: N/A      Last several PSA's:  Lab Results   Component Value Date    PSA 7.41 (H) 04/04/2023    PSA 3.38 (H) 01/06/2023    PSA 2.74 (H) 11/07/2017       Last total testosterone:  No results found for: "TESTOSTERONE"    Urinalysis today:  No results found for this visit on 04/11/23.    Last BUN and creatinine:  Lab Results   Component Value Date    BUN 17 01/06/2023     Lab Results   Component Value Date    CREATININE 1.2 01/06/2023         Imaging Reviewed during this Office Visit:   Iisha Soyars ALI Welton Flakes, MD independently reviewed the images and verified the radiology reports from:    No results found.    PAST MEDICAL, FAMILY AND SOCIAL HISTORY:  Past Medical History:   Diagnosis Date    Arthritis     general    CAD (coronary artery disease)     s/p stent  placement-sees Dr. Gabrielle Dare    Chronic back pain     Diabetes mellitus (HCC)     GERD (gastroesophageal reflux disease)     Hyperlipidemia     Hypertension      Past Surgical History:   Procedure Laterality Date    [place]    COLONOSCOPY Left 12/19/2017    COLONOSCOPY POLYPECTOMY HOT BIOPSY performed by Florian Buff, MD at [facility]    COLONOSCOPY N/A 01/10/2023    Colonoscopy, diagnostic with or without biopsy with or without polypectomy performed by Florian Buff, MD at [facility]    CORONARY ARTERY BYPASS GRAFT  2009    6 [place]    DIAGNOSTIC CARDIAC CATH LAB PROCEDURE      ORTHOPEDIC SURGERY Left     PTCA  07/04/2016    TONSILLECTOMY       Family History   Problem Relation Age of Onset    Cancer Mother     Heart Disease Brother     Stroke Brother     Diabetes Brother     Early Death Brother     High Blood Pressure Brother     High Cholesterol Brother     Mental Retardation Sister      Outpatient Medications Marked as Taking for the 04/11/23 encounter (Office Visit) with Helane Rima, MD    Medication Sig Dispense Refill    cetirizine (ZYRTEC) 10 MG tablet Take 1 tablet by mouth daily 90 tablet 3    Semaglutide, 1 MG/DOSE, 2 MG/1.5ML SOPN Inject 1 mg into the skin once a week 3 mL 5    predniSONE (DELTASONE) 20 MG tablet Take 1 tablet by mouth daily for 5 days Start tomorrow morning with food 5 tablet 0    oxyCODONE-acetaminophen (PERCOCET) 10-325 MG per tablet Take 1 tablet by mouth every 8 hours as needed for Pain for up to 30 days. 90 tablet 0    pregabalin (LYRICA) 150 MG capsule Take 1 capsule by mouth in the morning, at noon, and at bedtime for 180 days. 270 capsule 1    isosorbide mononitrate (IMDUR) 30 MG extended release tablet Take 1 tablet by mouth daily 90 tablet 3    fenofibrate (TRICOR) 145 MG tablet Take 1 tablet by mouth daily 90 tablet 3    metFORMIN (GLUCOPHAGE) 1000 MG tablet Take 1 tablet by mouth 2 times daily (with meals) 180 tablet 3    atorvastatin (LIPITOR) 80 MG tablet Take 1 tablet by mouth daily 90 tablet 3    SYMBICORT 80-4.5 MCG/ACT AERO INHALE 2 PUFFS INTO THE LUNGS 2 TIMES DAILY 1 each 11    fluticasone ([employer] FLUTICASONE PROPIONATE) 50 MCG/ACT nasal spray  48 each 0    hydrOXYzine HCl (ATARAX) 25 MG tablet TAKE 1 TABLET BY MOUTH EVERY 8 HOURS AS NEEDED FOR PAIN AND  SPASMING.  TAKE  WITH  PAIN  MEDICATION 90 tablet 3    lisinopril (PRINIVIL;ZESTRIL) 40 MG tablet Take 1 tablet by mouth daily 90 tablet 3    olopatadine (PATANOL) 0.1 % ophthalmic solution Place 1 drop into both eyes 2 times daily 3 each 3    ticagrelor (BRILINTA) 90 MG TABS tablet Take 1 tablet by mouth twice daily 180 tablet 3    esomeprazole (NEXIUM) 40 MG delayed release capsule TAKE 1 CAPSULE BY MOUTH ONCE DAILY IN THE MORNING BEFORE BREAKFAST 90 capsule 3    PARoxetine (PAXIL) 40 MG tablet Take  1 tablet by mouth daily 90 tablet 3    metoprolol succinate (TOPROL XL) 25 MG extended release tablet Take 1 tablet by mouth daily 90 tablet 3    tamsulosin (FLOMAX) 0.4 MG capsule Take 1 capsule by mouth 2  times daily 180 capsule 3    Lancets (ONETOUCH DELICA PLUS LANCET33G) MISC USE 1  TO CHECK GLUCOSE ONCE DAILY 100 each 0    ondansetron (ZOFRAN-ODT) 4 MG disintegrating tablet DISSOLVE 1 TABLET IN MOUTH THREE TIMES DAILY AS NEEDED FOR NAUSEA FOR VOMITING 21 tablet 0    montelukast (SINGULAIR) 10 MG tablet Take 1 tablet by mouth nightly 90 tablet 3    albuterol sulfate HFA (PROVENTIL;VENTOLIN;PROAIR) 108 (90 Base) MCG/ACT inhaler Inhale 2 puffs into the lungs every 6 hours as needed for Wheezing 18 g 3    glimepiride (AMARYL) 2 MG tablet Take 1 tablet by mouth twice daily 180 tablet 3    hydrocortisone 2.5 % cream Apply to external ear BID PRN 3 each 3    blood glucose monitor kit and supplies Dispense sufficient amount for indicated testing frequency plus additional to accommodate PRN testing needs. Dispense all needed supplies to include: monitor, strips, lancing device, lancets, control solutions, alcohol swabs. 1 kit 0    blood glucose monitor strips Test 1 times a day & as needed for symptoms of irregular blood glucose. Dispense sufficient amount for indicated testing frequency plus additional to accommodate PRN testing needs. 300 strip 0    pioglitazone (ACTOS) 15 MG tablet Take 1 tablet by mouth once daily 90 tablet 3    Handicap Placard MISC by Does not apply route Expires 06/30/25 1 each 0    NARCAN 4 MG/0.1ML LIQD nasal spray ADMINISTER A SINGLE SPRAY INTRANASALLY INTO ONE NOSTRIL. CALL 911. MAY REPEAT X 1.      nitroGLYCERIN (NITROSTAT) 0.4 MG SL tablet Place 1 tablet under the tongue every 5 minutes as needed for Chest pain 25 tablet 2       Gabapentin  Social History     Tobacco Use   Smoking Status Former    Current packs/day: 0.00    Average packs/day: 1 pack/day for 15.0 years (15.0 ttl pk-yrs)    Types: Cigarettes    Start date: 05/24/1993    Quit date: 05/24/2008    Years since quitting: 14.8   Smokeless Tobacco Never      (If patient a smoker, smoking cessation counseling offered)   Social History      Substance and Sexual Activity   Alcohol Use Yes    Alcohol/week: 1.0 standard drink of alcohol    Types: 1 Glasses of wine per week    Comment: once monthly stopped a over 2 years       REVIEW OF SYSTEMS:  Constitutional: negative  Eyes: negative  Respiratory: negative  Cardiovascular: negative  Gastrointestinal: negative  Genitourinary: see HPI  Musculoskeletal: negative  Skin: negative   Neurological: negative  Hematological/Lymphatic: negative  Psychological: negative      Physical Exam:    This a 68 y.o. male  Vitals:    04/11/23 1111   BP: 116/78   Pulse: 79   SpO2: 96%     Body mass index is 32.42 kg/m.  Constitutional: Patient in no acute distress;       Assessment and Plan        1. Benign localized prostatic hyperplasia with lower urinary tract symptoms (LUTS)    2. Elevated PSA    3.  Erectile dysfunction, unspecified erectile dysfunction type               Plan:   Assessment & Plan     BPH- stable. Cont flomax  ED- will hold off on aggressive treatment until pca workup  Elevated psa- PSA > 7. Prmi for poss biopsy localization      Prescriptions Ordered:  No orders of the defined types were placed in this encounter.     Orders Placed:  No orders of the defined types were placed in this encounter.           Meleny Tregoning ALI Welton Flakes, MD

## 2023-04-11 NOTE — Telephone Encounter (Signed)
 Patient scheduled for MRI PROSTATE W WO  at [facility] on 05/11/2023.  Arrival of 730 for a 800 scan time.  Order mailed with instructions to the patient

## 2023-04-25 ENCOUNTER — Encounter

## 2023-04-25 MED ORDER — OXYCODONE-ACETAMINOPHEN 10-325 MG PO TABS
10-325 MG | ORAL_TABLET | Freq: Three times a day (TID) | ORAL | 0 refills | Status: DC | PRN
Start: 2023-04-25 — End: 2023-05-23

## 2023-04-25 MED ORDER — DIAZEPAM 5 MG PO TABS
5 | ORAL_TABLET | Freq: Three times a day (TID) | ORAL | 2 refills | 9.00000 days | Status: DC | PRN
Start: 2023-04-25 — End: 2023-07-24

## 2023-04-25 NOTE — Telephone Encounter (Signed)
 Issue with semaglutide rx sent 2 weeks ago??

## 2023-04-25 NOTE — Telephone Encounter (Signed)
 Recent Visits  Date Type Provider Dept   04/06/23 Office Visit Jone Baseman, APRN - CNP [facility]   12/07/22 Office Visit Jone Baseman, APRN - CNP [facility]   08/02/22 Office Visit Jone Baseman, APRN - CNP [facility]   01/28/22 Office Visit Jone Baseman, APRN - CNP [facility]   Showing recent visits within past 540 days with a meds authorizing provider and meeting all other requirements  Future Appointments  Date Type Provider Dept   05/11/23 Appointment Jone Baseman, APRN - CNP [facility]   05/18/23 Appointment Jone Baseman, APRN - CNP [facility]   Showing future appointments within next 150 days with a meds authorizing provider and meeting all other requirements

## 2023-05-01 NOTE — Telephone Encounter (Signed)
error 

## 2023-05-10 ENCOUNTER — Encounter

## 2023-05-10 MED ORDER — FENOFIBRATE 145 MG PO TABS
145 | ORAL_TABLET | Freq: Every day | ORAL | 3 refills | Status: DC
Start: 2023-05-10 — End: 2023-08-22

## 2023-05-10 NOTE — Telephone Encounter (Signed)
 Pt doesn't want to use [facility]- rx pended for local pharmacy

## 2023-05-11 ENCOUNTER — Inpatient Hospital Stay: Admit: 2023-05-11 | Payer: MEDICARE | Attending: Urology | Primary: Family

## 2023-05-11 ENCOUNTER — Ambulatory Visit: Admit: 2023-05-11 | Discharge: 2023-05-11 | Payer: Medicare (Managed Care) | Attending: Family | Primary: Family

## 2023-05-11 VITALS — BP 124/76 | HR 89 | Temp 97.80000°F | Resp 14 | Ht 67.0 in | Wt 207.0 lb

## 2023-05-11 DIAGNOSIS — R972 Elevated prostate specific antigen [PSA]: Secondary | ICD-10-CM

## 2023-05-11 DIAGNOSIS — E119 Type 2 diabetes mellitus without complications: Principal | ICD-10-CM

## 2023-05-11 DIAGNOSIS — N400 Enlarged prostate without lower urinary tract symptoms: Secondary | ICD-10-CM

## 2023-05-11 LAB — POCT GLYCOSYLATED HEMOGLOBIN (HGB A1C): Hemoglobin A1C POC: 6.9 % — ABNORMAL HIGH (ref 4.3–5.7)

## 2023-05-11 LAB — POCT CREATININE: POC CREATININE WHOLE BLOOD: 1.2 mg/dL (ref 0.5–1.2)

## 2023-05-11 MED ORDER — PREDNISONE 20 MG PO TABS
20 | ORAL_TABLET | Freq: Every day | ORAL | 0 refills | 6.00000 days | Status: AC
Start: 2023-05-11 — End: 2023-05-16

## 2023-05-11 MED ORDER — GADOTERIDOL 279.3 MG/ML IV SOLN
279.3 | Freq: Once | INTRAVENOUS | Status: AC | PRN
Start: 2023-05-11 — End: 2023-05-11
  Administered 2023-05-11: 13:00:00 20 mL via INTRAVENOUS

## 2023-05-11 NOTE — Patient Instructions (Addendum)
 Decreased Glimepiride  to 1 tab in the morning.    Continue low carb diet

## 2023-05-11 NOTE — Progress Notes (Signed)
 Micheal [name] is a 68 y.o. male thatpresents for Follow-up and Diabetes      History obtained today from Patient.    History of Present Illness  The patient presents for evaluation of diabetes and rhinorrhea.    The chief complaint is diabetes management. He reports a satisfactory response to the increased dosage of his insulin injections, with no adverse effects such as gastrointestinal upset or nausea. Dietary modifications have been made, including a reduction in carbohydrate intake and cessation of frequent dining out. His weight has decreased from 220 pounds to 202 pounds over the past month. No symptoms of hyperglycemia, such as excessive thirst or hunger, palpitations, chest pain, blurred vision, or severe headaches, are reported. Additionally, there is no new onset of numbness or tingling. Appetite remains stable, and he has not consumed any food or medication today due to an MRI scan. Current medications include glimepiride  2 mg twice daily, metformin , and Actos .    The secondary complaint is rhinorrhea. Daily cetirizine  and Flonase  nasal spray are used for management. Postnasal drip and morning ocular pruritus are reported. Allergy  testing has not been performed. Montelukast  was previously prescribed for allergies but discontinued after the last visit due to insurance coverage issues, leading to worsened symptoms. No febrile episodes are reported, and steroids are tolerated well without disrupting sleep.    Severe pain in the leg and ankle swelling are reported.        I have reviewed the patient's past medical history, past surgical history, allergies,medications, social and family history and I have made updates where appropriate.    Past Medical History:   Diagnosis Date    Arthritis     general    CAD (coronary artery disease)     s/p stent placement-sees Dr. Hardin Leys    Chronic back pain     Diabetes mellitus (HCC)     GERD (gastroesophageal reflux disease)     Hyperlipidemia     Hypertension         Social History     Tobacco Use    Smoking status: Former     Current packs/day: 0.00     Average packs/day: 1 pack/day for 15.0 years (15.0 ttl pk-yrs)     Types: Cigarettes     Start date: 05/24/1993     Quit date: 05/24/2008     Years since quitting: 14.9    Smokeless tobacco: Never   Vaping Use    Vaping status: Never Used   Substance Use Topics    Alcohol use: Yes     Alcohol/week: 1.0 standard drink of alcohol     Types: 1 Glasses of wine per week     Comment: once monthly stopped a over 2 years    Drug use: No       Family History   Problem Relation Age of Onset    Cancer Mother     Heart Disease Brother     Stroke Brother     Diabetes Brother     Early Death Brother     High Blood Pressure Brother     High Cholesterol Brother     Mental Retardation Sister          Review of Systems        PHYSICAL EXAM:  BP 124/76   Pulse 89   Temp 97.8 F (36.6 C) (Oral)   Resp 14   Ht 1.702 m (5\' 7" )   Wt 93.9 kg (207 lb)   SpO2  94%   BMI 32.42 kg/m     Physical Exam  Constitutional:       Appearance: Normal appearance.   HENT:      Head: Normocephalic and atraumatic.      Right Ear: External ear normal.      Left Ear: External ear normal.      Nose: Rhinorrhea present.      Mouth/Throat:      Mouth: Mucous membranes are moist.   Eyes:      Conjunctiva/sclera: Conjunctivae normal.   Cardiovascular:      Rate and Rhythm: Normal rate and regular rhythm.      Pulses: Normal pulses.      Heart sounds: Normal heart sounds.   Pulmonary:      Effort: Pulmonary effort is normal.      Breath sounds: Normal breath sounds.   Abdominal:      General: Bowel sounds are normal.      Palpations: Abdomen is soft.   Musculoskeletal:         General: Normal range of motion.      Cervical back: Normal range of motion.   Skin:     General: Skin is warm and dry.   Neurological:      General: No focal deficit present.      Mental Status: He is alert and oriented to person, place, and time.   Psychiatric:         Mood and Affect: Mood  normal.         Behavior: Behavior normal.           ASSESSMENT & PLAN  [name] was seen today for follow-up and diabetes.    Diagnoses and all orders for this visit:    Type 2 diabetes mellitus without complication, without long-term current use of insulin  -     POCT glycosylated hemoglobin (Hb A1C)    Allergic rhinitis, unspecified seasonality, unspecified trigger  -     predniSONE  (DELTASONE ) 20 MG tablet; Take 1 tablet by mouth daily for 5 days    Zyrtec  daily and Flonase   Call if not improving      A1c stable.  RTO 2 mos    No follow-ups on file.    Start above treatments    All copied or forwarded information in the progress note was verified by me to be accurate at the time of visit  Patient's past medical, surgical, social and family history were reviewed and updated     All patient questions answered.  Patient voiced understanding.     The patient (or guardian, if applicable) and other individuals in attendance with the patient were advised that Artificial Intelligence will be utilized during this visit to record, process the conversation to generate a clinical note, and support improvement of the AI technology. The patient (or guardian, if applicable) and other individuals in attendance at the appointment consented to the use of AI, including the recording.

## 2023-05-16 ENCOUNTER — Ambulatory Visit: Admit: 2023-05-16 | Discharge: 2023-05-16 | Payer: Medicare (Managed Care) | Primary: Family

## 2023-05-16 ENCOUNTER — Other Ambulatory Visit: Admit: 2023-05-16 | Payer: Medicare (Managed Care) | Primary: Family

## 2023-05-16 VITALS — Resp 18 | Ht 67.0 in | Wt 207.0 lb

## 2023-05-16 DIAGNOSIS — R972 Elevated prostate specific antigen [PSA]: Secondary | ICD-10-CM

## 2023-05-16 NOTE — Progress Notes (Signed)
 [facility] [place] [facility]  [facility]  [address]  [address]  [place] [address]  Dept: [phone]  Loc: [phone]    Visit Date: 05/16/2023    History of Present Illness  Micheal [name] is a 68 y.o. male,Established patient, here for evaluation of the following chief complaint(s):  Results (MRI)    Elevated PSA  7.41, 3.38, 2.74. Persistent elevation. Underwent prostate MRI 05/11/23- PI-RADS 3.     BPH  Started on Flomax  for urinary frequency and nocturia back in 2019. Stable. 70g prostate noted on MRI.     ED  Discussed trimix in past, unable to take PO meds 2/2 nitrates    Current Outpatient Medications   Medication Sig Dispense Refill    predniSONE  (DELTASONE ) 20 MG tablet Take 1 tablet by mouth daily for 5 days 5 tablet 0    fenofibrate  (TRICOR ) 145 MG tablet Take 1 tablet by mouth daily 90 tablet 3    diazePAM  (VALIUM ) 5 MG tablet Take 1 tablet by mouth every 8 hours as needed for Anxiety for up to 90 days. Max Daily Amount: 15 mg 90 tablet 2    oxyCODONE -acetaminophen  (PERCOCET ) 10-325 MG per tablet Take 1 tablet by mouth every 8 hours as needed for Pain for up to 30 days. 90 tablet 0    cetirizine  (ZYRTEC ) 10 MG tablet Take 1 tablet by mouth daily 90 tablet 3    Semaglutide , 1 MG/DOSE, 2 MG/1.5ML SOPN Inject 1 mg into the skin once a week 3 mL 5    pregabalin  (LYRICA ) 150 MG capsule Take 1 capsule by mouth in the morning, at noon, and at bedtime for 180 days. 270 capsule 1    isosorbide  mononitrate (IMDUR ) 30 MG extended release tablet Take 1 tablet by mouth daily 90 tablet 3    metFORMIN  (GLUCOPHAGE ) 1000 MG tablet Take 1 tablet by mouth 2 times daily (with meals) 180 tablet 3    atorvastatin  (LIPITOR ) 80 MG tablet Take 1 tablet by mouth daily 90 tablet 3    SYMBICORT  80-4.5 MCG/ACT AERO INHALE 2 PUFFS INTO THE LUNGS 2 TIMES DAILY 1 each 11    fluticasone  ([employer] FLUTICASONE  PROPIONATE) 50 MCG/ACT nasal spray  48 each 0    hydrOXYzine  HCl (ATARAX ) 25 MG tablet TAKE 1  TABLET BY MOUTH EVERY 8 HOURS AS NEEDED FOR PAIN AND  SPASMING.  TAKE  WITH  PAIN  MEDICATION 90 tablet 3    lisinopril  (PRINIVIL ;ZESTRIL ) 40 MG tablet Take 1 tablet by mouth daily 90 tablet 3    olopatadine  (PATANOL) 0.1 % ophthalmic solution Place 1 drop into both eyes 2 times daily 3 each 3    ticagrelor  (BRILINTA ) 90 MG TABS tablet Take 1 tablet by mouth twice daily 180 tablet 3    esomeprazole  (NEXIUM ) 40 MG delayed release capsule TAKE 1 CAPSULE BY MOUTH ONCE DAILY IN THE MORNING BEFORE BREAKFAST 90 capsule 3    PARoxetine  (PAXIL ) 40 MG tablet Take 1 tablet by mouth daily 90 tablet 3    metoprolol  succinate (TOPROL  XL) 25 MG extended release tablet Take 1 tablet by mouth daily 90 tablet 3    tamsulosin  (FLOMAX ) 0.4 MG capsule Take 1 capsule by mouth 2 times daily 180 capsule 3    Lancets (ONETOUCH DELICA PLUS LANCET33G) MISC USE 1  TO CHECK GLUCOSE ONCE DAILY 100 each 0    ondansetron  (ZOFRAN -ODT) 4 MG disintegrating tablet DISSOLVE 1 TABLET IN MOUTH THREE TIMES  DAILY AS NEEDED FOR NAUSEA FOR VOMITING 21 tablet 0    montelukast  (SINGULAIR ) 10 MG tablet Take 1 tablet by mouth nightly 90 tablet 3    albuterol  sulfate HFA (PROVENTIL ;VENTOLIN ;PROAIR ) 108 (90 Base) MCG/ACT inhaler Inhale 2 puffs into the lungs every 6 hours as needed for Wheezing 18 g 3    glimepiride  (AMARYL ) 2 MG tablet Take 1 tablet by mouth twice daily 180 tablet 3    hydrocortisone  2.5 % cream Apply to external ear BID PRN 3 each 3    blood glucose monitor kit and supplies Dispense sufficient amount for indicated testing frequency plus additional to accommodate PRN testing needs. Dispense all needed supplies to include: monitor, strips, lancing device, lancets, control solutions, alcohol swabs. 1 kit 0    blood glucose monitor strips Test 1 times a day & as needed for symptoms of irregular blood glucose. Dispense sufficient amount for indicated testing frequency plus additional to accommodate PRN testing needs. 300 strip 0    pioglitazone   (ACTOS ) 15 MG tablet Take 1 tablet by mouth once daily 90 tablet 3    Handicap Placard MISC by Does not apply route Expires 06/30/25 1 each 0    NARCAN  4 MG/0.1ML LIQD nasal spray ADMINISTER A SINGLE SPRAY INTRANASALLY INTO ONE NOSTRIL. CALL 911. MAY REPEAT X 1.      nitroGLYCERIN  (NITROSTAT ) 0.4 MG SL tablet Place 1 tablet under the tongue every 5 minutes as needed for Chest pain 25 tablet 2     No current facility-administered medications for this visit.       Past Medical History  Micheal [name]  has a past medical history of Arthritis, CAD (coronary artery disease), Chronic back pain, Diabetes mellitus (HCC), GERD (gastroesophageal reflux disease), Hyperlipidemia, and Hypertension.    Past Surgical History  The patient  has a past surgical history that includes Coronary artery bypass graft (2009); orthopedic surgery (Left); Diagnostic Cardiac Cath Lab Procedure; Percutaneous Transluminal Coronary Angio (07/04/2016); Tonsillectomy; Colonoscopy (2015); Colonoscopy (Left, 12/19/2017); and Colonoscopy (N/A, 01/10/2023).    Family History  This patient's family history includes Cancer in his mother; Diabetes in his brother; Early Death in his brother; Heart Disease in his brother; High Blood Pressure in his brother; High Cholesterol in his brother; Mental Retardation in his sister; Stroke in his brother.    Social History  Micheal [name]  reports that he quit smoking about 14 years ago. His smoking use included cigarettes. He started smoking about 29 years ago. He has a 15 pack-year smoking history. He has never used smokeless tobacco. He reports current alcohol use of about 1.0 standard drink of alcohol per week. He reports that he does not use drugs.    Subjective   Review of Systems   Constitutional:  Negative for chills and fever.   Gastrointestinal:  Negative for abdominal pain.   Genitourinary:  Negative for dysuria, flank pain, frequency, hematuria and urgency.   All other systems reviewed and are negative.         Objective    Resp 18   Ht 1.702 m (5\' 7" )   Wt 93.9 kg (207 lb)   BMI 32.42 kg/m   Physical Exam  Vitals and nursing note reviewed.   Constitutional:       General: He is not in acute distress.  HENT:      Head: Normocephalic and atraumatic.      Right Ear: External ear normal.      Left Ear: External ear normal.  Nose: Nose normal.      Mouth/Throat:      Mouth: Mucous membranes are moist.   Cardiovascular:      Pulses: Normal pulses.   Pulmonary:      Effort: Pulmonary effort is normal. No respiratory distress.   Musculoskeletal:      Cervical back: Neck supple.   Skin:     General: Skin is warm and dry.   Neurological:      Mental Status: He is alert. Mental status is at baseline.   Psychiatric:         Mood and Affect: Mood normal.         Behavior: Behavior normal.         POC  No results found for this visit on 05/16/23.    Last 3 PSA Levels  Lab Results   Component Value Date    PSA 7.41 (H) 04/04/2023    PSA 3.38 (H) 01/06/2023    PSA 2.74 (H) 11/07/2017        Recent BUN/Creatinine:  Lab Results   Component Value Date/Time    BUN 17 01/06/2023 09:49 AM    CREATININE 1.2 01/06/2023 09:49 AM       Radiology  The patient has had a MRI which I have independently reviewed along with its accompanying report.  The study demonstrates FINDINGS:   PROSTATE SIZE:  1. The prostate gland is moderately enlarged measuring 5.6 x 4.9 x 4.9 cm  2. The prostate volume is 69.92 ml.     TRANSITIONAL ZONE:  1. Enlarged and heterogeneous appearance.  2. Multiple encapsulated and nonencapsulated nodules.     CENTRAL ZONE:  1. Unremarkable.     PERIPHERAL ZONE:  1. Unremarkable.     PROSTATE LESIONS:     1.  Lesion #: 1  Location: Right posterior transition zone (series 4, image 16)  Size: 1.7 x 1.2 cm  T2: Moderately T2 hypointense nodule  ADC: Moderate T2 hypointense  DWI: Moderately T2 hyperintense  DCE: Positive  Prostate margin: Intact  Overall PI-RADS category: 3/5     SEMINAL VESICLES: Unremarkable.     NEUROVASCULAR BUNDLES:  Intact     URINARY BLADDER/RECTUM/PELVIC DIAPHRAGM:  1. Circumferential bladder wall thickening as can be seen with chronic urinary  retention.  2. The rectum and pelvic diaphragm are unremarkable.     PATHOLOGICALLY ENLARGED LYMPH NODES:  1. None.     OSSEOUS STRUCTURES:  1. Degenerative changes of the SI joints.     OTHER:  1. Small bilateral fat-containing inguinal annulus.     IMPRESSION:  1. A 1.7 x 1.2 cm focal prostatic nodule is seen in the right posterior  transition zone in the mid gland of the prostate (PI-RADS 3).  2. Overall PI-RADS assessment is PI-RADS 3.  3. Moderate prostatomegaly.        Assessment & Plan  Elevated PSA  - MRI consistent with PI-RADS 3 lesion. Recommended biopsy to patient, he is very hesitant and wants to discuss with wife prior to biopsy. Discussed procedure in detail with associated risks, benefits, and alternatives.   - Check IsoPSA, f/u Micheal [name] 1-2 weeks. I did tell him if his PSA continues to rise then we will strongly urge biopsy at that point. He will consider after PSA check.          Benign localized prostatic hyperplasia with lower urinary tract symptoms (LUTS)  - Stable. Continue Flomax .          Erectile dysfunction,  unspecified erectile dysfunction type  - Defer aggressive treatment for now, proceed with prostate cancer workup first.            F/u 1-2 weeks Micheal [name] review IsoPSA           Micheal Hodgkins Dorthy Hustead, APRN - CNP   05/16/23 1:11 PM  Urology    An electronic signature was used to authenticate this note.

## 2023-05-16 NOTE — Progress Notes (Signed)
 ISO PSA kit was given to patient with instructions on which lab to get it completed at. See media for scanned in order.

## 2023-05-16 NOTE — Patient Instructions (Signed)
 Please call the office at [phone] if you have any questions or concerns following your visit. If you were prescribed a new medication and have questions, feel free to call. For any emergent issues, please go to the nearest emergency room for further evaluation.

## 2023-05-18 ENCOUNTER — Encounter: Payer: Medicare (Managed Care) | Attending: Family | Primary: Family

## 2023-05-18 NOTE — Telephone Encounter (Signed)
 Pt came into office to talk to simpson as he stated last week he was put on the medication zyrtec  and he stated " Prozac" which I did not see on med list   Pt states whatever medicine he was given made his tongue and throat swell and he could "hardly breathe" . Pt is requesting appt to talk about this.         Pt stated it started the day after his appt. Pt did stop taking it.

## 2023-05-18 NOTE — Telephone Encounter (Signed)
 Touch base with pt over the phone and see if he is still having the issues with the throat and tongue swelling or did it resolve after stopping it?  I was unable to see him in the office today.  I did add the Prednisone  to his allergy  list.

## 2023-05-18 NOTE — Telephone Encounter (Signed)
 Told pt the message as he was in office and he stated "okay so she wont see me" and I told him there was not much else we could do but take him off of it as you said and put on allergy  list.Pt left in a hurry afterwards.          Thank you

## 2023-05-18 NOTE — Telephone Encounter (Signed)
 Patient states that his tongue and throat are still bothering him, he did stop the prednisone . Micheal [name] it had gotten a little better afterwards but still sore. He said that's why he wanted to see you, and said he was upset that he wasn't able to.

## 2023-05-18 NOTE — Telephone Encounter (Signed)
 I'm sorry I didn't have any openings.  And we don't do Walk Ins anymore.  Does he want to see someone tomorrow?

## 2023-05-18 NOTE — Telephone Encounter (Signed)
 It was Prednisone  (steroid).  He's been on it several times in the past but I can add it to his allergy  list.  Just stop it and I will add it to his allergy  list.  Continue Zyrtec  and Flonase  nasal spray.

## 2023-05-19 NOTE — Telephone Encounter (Signed)
 Spoke with patient he said " he's doing better and didn't want an appt."    Told to call if it worsen.

## 2023-05-19 NOTE — Telephone Encounter (Signed)
 Noted, thank you

## 2023-05-23 ENCOUNTER — Ambulatory Visit: Payer: Medicare (Managed Care) | Attending: Urology | Primary: Family

## 2023-05-23 ENCOUNTER — Encounter

## 2023-05-23 NOTE — Telephone Encounter (Signed)
 Recent Visits  Date Type Provider Dept   05/11/23 Office Visit Magali Schmitz, APRN - CNP [facility]   04/06/23 Office Visit Magali Schmitz, APRN - CNP [facility]   12/07/22 Office Visit Magali Schmitz, APRN - CNP [facility]   08/02/22 Office Visit Magali Schmitz, APRN - CNP [facility]   01/28/22 Office Visit Magali Schmitz, APRN - CNP [facility]   Showing recent visits within past 540 days with a meds authorizing provider and meeting all other requirements  Future Appointments  Date Type Provider Dept   07/11/23 Appointment Magali Schmitz, APRN - CNP [facility]   Showing future appointments within next 150 days with a meds authorizing provider and meeting all other requirements

## 2023-05-24 MED ORDER — OXYCODONE-ACETAMINOPHEN 10-325 MG PO TABS
10-325 | ORAL_TABLET | Freq: Three times a day (TID) | ORAL | 0 refills | 7.00000 days | Status: AC | PRN
Start: 2023-05-24 — End: 2023-06-23

## 2023-05-24 NOTE — Telephone Encounter (Signed)
 I reviewed an [place] report on patient today.  There were no abnormal findings on the report.  Rx sent  Electronically signed by Magali Schmitz, APRN - CNP on 05/24/23 at 3:34 PM EDT

## 2023-05-29 ENCOUNTER — Encounter

## 2023-05-29 MED ORDER — METOPROLOL SUCCINATE ER 25 MG PO TB24
25 | ORAL_TABLET | Freq: Every day | ORAL | 3 refills | 52.50000 days | Status: AC
Start: 2023-05-29 — End: ?

## 2023-05-29 MED ORDER — LISINOPRIL 40 MG PO TABS
40 | ORAL_TABLET | Freq: Every day | ORAL | 3 refills | 90.00000 days | Status: AC
Start: 2023-05-29 — End: ?

## 2023-05-29 NOTE — Telephone Encounter (Signed)
 Patient needing these two sent to [facility] on [address] - no longer using home delivery     Recent Visits  Date Type Provider Dept   05/11/23 Office Visit Magali Schmitz, APRN - CNP [facility]   04/06/23 Office Visit Magali Schmitz, APRN - CNP [facility]   12/07/22 Office Visit Magali Schmitz, APRN - CNP [facility]   08/02/22 Office Visit Magali Schmitz, APRN - CNP [facility]   01/28/22 Office Visit Magali Schmitz, APRN - CNP [facility]   Showing recent visits within past 540 days with a meds authorizing provider and meeting all other requirements  Future Appointments  Date Type Provider Dept   07/11/23 Appointment Magali Schmitz, APRN - CNP [facility]   Showing future appointments within next 150 days with a meds authorizing provider and meeting all other requirements

## 2023-06-01 ENCOUNTER — Ambulatory Visit: Admit: 2023-06-01 | Discharge: 2023-06-01 | Payer: Medicare (Managed Care) | Attending: Urology | Primary: Family

## 2023-06-01 ENCOUNTER — Encounter

## 2023-06-01 VITALS — Resp 20 | Ht 67.01 in | Wt 207.0 lb

## 2023-06-01 DIAGNOSIS — R972 Elevated prostate specific antigen [PSA]: Secondary | ICD-10-CM

## 2023-06-01 MED ORDER — CIPROFLOXACIN HCL 500 MG PO TABS
500 | ORAL_TABLET | Freq: Two times a day (BID) | ORAL | 0 refills | 7.00000 days | Status: DC
Start: 2023-06-01 — End: 2023-07-18

## 2023-06-01 NOTE — Progress Notes (Signed)
 Micheal Sievert MD.     [facility] [place] [facility]  [facility]  [phone]  [address]  [place] [address]  Dept: [phone]  Dept Fax: [phone]  Loc: [phone]    [facility] Office Note -     Patient:  Micheal [name]  Date of Birth: 1955-02-20    The patient is a 68 y.o. male who presents today for evaluation of the following problems:   Chief Complaint   Patient presents with    Follow-up    Elevated PSA    Results     Iso psa is done         History of Present Illness:    Elevated psa  Isopsa discussed  pMRI pirads 3  Psa reviewed      ED  On nitrates  Not interested in trimix    BPH  On flomax       Summary of Previous Records:  Patient is a 68 year old male who presents to urology for evaluation of elevated PSA + erectile dysfunction + BPH.    Has a history of elevated PSA, iso-PSA of 2.2    Completed on 05/24/2023.    He did have a PSA of 7.41 on 04/04/2023 and underwent a MRI which showed PI-RADS 3 with moderate prostatomegaly.  He was undecided about biopsy    History of erectile dysfunction, cannot tolerate p.o. medications secondary to nitrates.      Assessment     elevated PSA proceed with biopsy  due to PI-RADS 3 on MRI  ISO PSA of 2.2 less than the thresh hold?       BPH continue Flomax , works well only get up once at night.     ED defer treatment with p.o. medication secondary to heart history, does not want Trimix injections. Did get a sample however did not use due to not want to inject.     Penis pump?          Requested/reviewed records from Magali Schmitz, APRN - CNP office and/or outside physician/EMR    (Patient's old records have been requested, reviewed and pertinent findings summarized in today's note.)    Procedures Today: N/A    Last several PSA's:  Lab Results   Component Value Date    PSA 7.41 (H) 04/04/2023    PSA 3.38 (H) 01/06/2023    PSA 2.74 (H) 11/07/2017       Last total testosterone:  No results found for:  "TESTOSTERONE"    Urinalysis today:  No results found for this visit on 06/01/23.    Last BUN and creatinine:  Lab Results   Component Value Date    BUN 17 01/06/2023     Lab Results   Component Value Date    CREATININE 1.2 01/06/2023       Imaging Reviewed during this Office Visit:   Micheal Massa, MD independently reviewed the images and verified the radiology reports from:    MRI PROSTATE W WO CONTRAST  Result Date: 05/11/2023  PROCEDURE: MRI PROSTATE W WO CONTRAST CLINICAL INFORMATION: Elevated prostate specific antigen (PSA) COMPARISON: None TECHNIQUE: Axial T2, coronal T2 and sagittal T2 imaging of the prostate gland. Large field of view axial T2 imaging of the prostate gland. Axial T1, axial T1 VIBE dynamic post gad and axial T1 VIBE dynamic subtracted post gad images through the prostate gland. Diffusion 50, 800, 1400 and ADC maps through the prostate gland. Axial  T1 STAR VIBE post gad through the pelvis. 3-D images reconstructed on a separate Dyna Cad workstation with MRI TRUS fusion of prostate mass lesions. CONTRAST: 20  mL of ProHance   intravenously. LABORATORY: 1. PSA on 04/04/2023 was 7.41 ng/ml 2. PSA on 01/06/2023 was 3.38 ng/ml 3. PSA on 11/07/2017 was 2.74 ng/ml FINDINGS: PROSTATE SIZE: 1. The prostate gland is moderately enlarged measuring 5.6 x 4.9 x 4.9 cm 2. The prostate volume is 69.92 ml. TRANSITIONAL ZONE: 1. Enlarged and heterogeneous appearance. 2. Multiple encapsulated and nonencapsulated nodules. CENTRAL ZONE: 1. Unremarkable. PERIPHERAL ZONE: 1. Unremarkable. PROSTATE LESIONS: 1. Lesion #: 1 Location: Right posterior transition zone (series 4, image 16) Size: 1.7 x 1.2 cm T2: Moderately T2 hypointense nodule ADC: Moderate T2 hypointense DWI: Moderately T2 hyperintense DCE: Positive Prostate margin: Intact Overall PI-RADS category: 3/5 SEMINAL VESICLES: Unremarkable. NEUROVASCULAR BUNDLES: Intact URINARY BLADDER/RECTUM/PELVIC DIAPHRAGM: 1. Circumferential bladder wall thickening as can be  seen with chronic urinary retention. 2. The rectum and pelvic diaphragm are unremarkable. PATHOLOGICALLY ENLARGED LYMPH NODES: 1. None. OSSEOUS STRUCTURES: 1. Degenerative changes of the SI joints. OTHER: 1. Small bilateral fat-containing inguinal annulus.     1. A 1.7 x 1.2 cm focal prostatic nodule is seen in the right posterior transition zone in the mid gland of the prostate (PI-RADS 3). 2. Overall PI-RADS assessment is PI-RADS 3. 3. Moderate prostatomegaly. **PI-RADS v 2.1 assessment categories** PI-RADS 1: Very low (clinically significant cancer is highly unlikely to be present) PI-RADS 2: Low (clinically significant cancer is unlikely to be present) PI-RADS 3: Intermediate (the presence of clinically significant cancer is equivocal) PI-RADS 4: High (clinically significant cancer is likely to be present) PI-RADS-5: Very high (clinically significant cancer is highly likely to be present) **This report has been created using voice recognition software. It may contain minor errors which are inherent in voice recognition technology.** Electronically signed by Dr. Ivette Marks      PAST MEDICAL, FAMILY AND SOCIAL HISTORY:  Past Medical History:   Diagnosis Date    Arthritis     general    CAD (coronary artery disease)     s/p stent placement-sees Dr. Hardin Leys    Chronic back pain     Diabetes mellitus (HCC)     GERD (gastroesophageal reflux disease)     Hyperlipidemia     Hypertension      Past Surgical History:   Procedure Laterality Date    COLONOSCOPY  2015    [place], [place]     COLONOSCOPY Left 12/19/2017    COLONOSCOPY POLYPECTOMY HOT BIOPSY performed by Lionell Riddles, MD at [facility]    COLONOSCOPY N/A 01/10/2023    Colonoscopy, diagnostic with or without biopsy with or without polypectomy performed by Lionell Riddles, MD at [facility] ENDOSCOPY    CORONARY ARTERY BYPASS GRAFT  2009    6 vessel-[place]     DIAGNOSTIC CARDIAC CATH LAB PROCEDURE      ORTHOPEDIC SURGERY Left     PTCA   07/04/2016    TONSILLECTOMY       Family History   Problem Relation Age of Onset    Cancer Mother     Heart Disease Brother     Stroke Brother     Diabetes Brother     Early Death Brother     High Blood Pressure Brother     High Cholesterol Brother     Mental Retardation Sister      Outpatient Medications Marked as Taking for the 06/01/23 encounter (  Office Visit) with Lindalee Retort, MD   Medication Sig Dispense Refill    metoprolol  succinate (TOPROL  XL) 25 MG extended release tablet Take 1 tablet by mouth daily 90 tablet 3    lisinopril  (PRINIVIL ;ZESTRIL ) 40 MG tablet Take 1 tablet by mouth daily 90 tablet 3    oxyCODONE -acetaminophen  (PERCOCET ) 10-325 MG per tablet Take 1 tablet by mouth every 8 hours as needed for Pain for up to 30 days. 90 tablet 0    fenofibrate  (TRICOR ) 145 MG tablet Take 1 tablet by mouth daily 90 tablet 3    diazePAM  (VALIUM ) 5 MG tablet Take 1 tablet by mouth every 8 hours as needed for Anxiety for up to 90 days. Max Daily Amount: 15 mg 90 tablet 2    cetirizine  (ZYRTEC ) 10 MG tablet Take 1 tablet by mouth daily 90 tablet 3    Semaglutide , 1 MG/DOSE, 2 MG/1.5ML SOPN Inject 1 mg into the skin once a week 3 mL 5    pregabalin  (LYRICA ) 150 MG capsule Take 1 capsule by mouth in the morning, at noon, and at bedtime for 180 days. 270 capsule 1    isosorbide  mononitrate (IMDUR ) 30 MG extended release tablet Take 1 tablet by mouth daily 90 tablet 3    metFORMIN  (GLUCOPHAGE ) 1000 MG tablet Take 1 tablet by mouth 2 times daily (with meals) 180 tablet 3    atorvastatin  (LIPITOR ) 80 MG tablet Take 1 tablet by mouth daily 90 tablet 3    SYMBICORT  80-4.5 MCG/ACT AERO INHALE 2 PUFFS INTO THE LUNGS 2 TIMES DAILY 1 each 11    fluticasone  ([employer] FLUTICASONE  PROPIONATE) 50 MCG/ACT nasal spray  48 each 0    hydrOXYzine  HCl (ATARAX ) 25 MG tablet TAKE 1 TABLET BY MOUTH EVERY 8 HOURS AS NEEDED FOR PAIN AND  SPASMING.  TAKE  WITH  PAIN  MEDICATION 90 tablet 3    olopatadine  (PATANOL) 0.1 % ophthalmic solution Place 1 drop  into both eyes 2 times daily 3 each 3    ticagrelor  (BRILINTA ) 90 MG TABS tablet Take 1 tablet by mouth twice daily 180 tablet 3    esomeprazole  (NEXIUM ) 40 MG delayed release capsule TAKE 1 CAPSULE BY MOUTH ONCE DAILY IN THE MORNING BEFORE BREAKFAST 90 capsule 3    PARoxetine  (PAXIL ) 40 MG tablet Take 1 tablet by mouth daily 90 tablet 3    tamsulosin  (FLOMAX ) 0.4 MG capsule Take 1 capsule by mouth 2 times daily 180 capsule 3    Lancets (ONETOUCH DELICA PLUS LANCET33G) MISC USE 1  TO CHECK GLUCOSE ONCE DAILY 100 each 0    ondansetron  (ZOFRAN -ODT) 4 MG disintegrating tablet DISSOLVE 1 TABLET IN MOUTH THREE TIMES DAILY AS NEEDED FOR NAUSEA FOR VOMITING 21 tablet 0    montelukast  (SINGULAIR ) 10 MG tablet Take 1 tablet by mouth nightly 90 tablet 3    albuterol  sulfate HFA (PROVENTIL ;VENTOLIN ;PROAIR ) 108 (90 Base) MCG/ACT inhaler Inhale 2 puffs into the lungs every 6 hours as needed for Wheezing 18 g 3    glimepiride  (AMARYL ) 2 MG tablet Take 1 tablet by mouth twice daily 180 tablet 3    hydrocortisone  2.5 % cream Apply to external ear BID PRN 3 each 3    blood glucose monitor kit and supplies Dispense sufficient amount for indicated testing frequency plus additional to accommodate PRN testing needs. Dispense all needed supplies to include: monitor, strips, lancing device, lancets, control solutions, alcohol swabs. 1 kit 0    blood glucose monitor strips Test 1 times a  day & as needed for symptoms of irregular blood glucose. Dispense sufficient amount for indicated testing frequency plus additional to accommodate PRN testing needs. 300 strip 0    pioglitazone  (ACTOS ) 15 MG tablet Take 1 tablet by mouth once daily 90 tablet 3    Handicap Placard MISC by Does not apply route Expires 06/30/25 1 each 0    NARCAN  4 MG/0.1ML LIQD nasal spray ADMINISTER A SINGLE SPRAY INTRANASALLY INTO ONE NOSTRIL. CALL 911. MAY REPEAT X 1.      nitroGLYCERIN  (NITROSTAT ) 0.4 MG SL tablet Place 1 tablet under the tongue every 5 minutes as needed  for Chest pain 25 tablet 2       Gabapentin  and Prednisone   Social History     Tobacco Use   Smoking Status Former    Current packs/day: 0.00    Average packs/day: 1 pack/day for 15.0 years (15.0 ttl pk-yrs)    Types: Cigarettes    Start date: 05/24/1993    Quit date: 05/24/2008    Years since quitting: 15.0   Smokeless Tobacco Never      (If patient a smoker, smoking cessation counseling offered)   Social History     Substance and Sexual Activity   Alcohol Use Yes    Alcohol/week: 1.0 standard drink of alcohol    Types: 1 Glasses of wine per week    Comment: once monthly stopped a over 2 years       REVIEW OF SYSTEMS:  Constitutional: negative  Eyes: negative  Respiratory: negative  Cardiovascular: negative  Gastrointestinal: negative  Genitourinary: see HPI  Musculoskeletal: negative  Skin: negative   Neurological: negative  Hematological/Lymphatic: negative  Psychological: negative        Physical Exam:    This a 68 y.o. male  Vitals:    06/01/23 0817   Resp: 20     Body mass index is 32.41 kg/m.  Constitutional: Patient in no acute distress;         Assessment and Plan        1. Elevated PSA    2. Benign localized prostatic hyperplasia with lower urinary tract symptoms (LUTS)    3. Erectile dysfunction, unspecified erectile dysfunction type               Plan:   Assessment & Plan     Elevated psa- fusion biopsy [facility] [place]  BPH- stable on flomax . Continue.   ED- cannot talk oral meds. Not interested in trimix. May consider IPP in future.       Prescriptions Ordered:  No orders of the defined types were placed in this encounter.     Orders Placed:  No orders of the defined types were placed in this encounter.           Kisha Messman ALI Meredeth Stallion, MD

## 2023-06-09 ENCOUNTER — Telehealth

## 2023-06-09 ENCOUNTER — Encounter

## 2023-06-09 NOTE — Telephone Encounter (Signed)
 Patient scheduled for a URONAV PROSTATE BIOPSY UNDER MAC with DR Meredeth Stallion on 07/10/2023. We are asking for clearance and direction on holding Brilinta .

## 2023-06-09 NOTE — Telephone Encounter (Addendum)
 DO NOT TAKE  FISH OIL, MOBIC, IBUPROFEN, MOTRIN-LIKE DRUGS AND ANY MULTIVITAMINS OR OVER THE COUNTER SUPPLEMENTS 14 DAYS PRIOR TO SURGERY.    HOLD BRILINTA  3 DAYS PRIOR TO SURGERY  HOLD LISINOPRIL , GLIMEPIRIDE  THE MORNING OF SURGERY  HOLD METFORMIN  2 DAYS PRIOR TO SURGERY  HOLD SEMAGLUTIDE  1 WEEK PRIOR TO SURGERY    MUST HAVE AN ADULT OVER THE AGE OF 18 WITH YOU AT THE TIME OF THE DISCHARGE         Micheal [name] 1955-04-19     Surgical Physician: Dr. Guerry Leek have been scheduled for the procedure marked below:      Surgery: MRI FUSION GUIDED PROSTATE BIOPSY         Date: 07/10/2023     Anesthesia:  MAC     Place of Service: [facility] [address] [employer] [place], [place]  **[place] [phone] EXT [phone], THE FRIDAY PRIOR TO SURGERY, AFTER 8:30AM, FOR ARRIVAL TIME**      INSTRUCTIONS AS MARKED BELOW:    1.  DO NOT eat or drink anything after midnight before surgery.   2.  We prefer you shower or bathe with an antibacterial soap (Dial) the morning of surgery.  3  Please bring a current medication list, photo ID and insurance card(s) with you  4. Okay to take Tylenol   5. Take blood pressure or heart medication as directed, if taken in the morning take with a small sip of water  6.The office will call you in 1-2 days after your procedure to schedule a follow up.  7.  START CIPRO  ON 07/09/2023; AS DIRECTED  8.  DO FLEETS ENEMA 2 HOURS PRIOR TO ARRIVAL TIME    DATE SENSITIVE PRE OP TESTING:    TO AVOID YOUR SURGERY BEING CANCELLED, DO TESTING ON THE DATE LISTED (*WALK IN* NO APPOINTMENT NEEDED).      DO URINE CULTURE AND FASTING LABS ON 06/26/2023        Date: 06/09/2023

## 2023-06-09 NOTE — Telephone Encounter (Signed)
 Future Appointments   Date Time Provider Department Center   06/29/2023  3:00 PM Magali Schmitz, APRN - CNP [facility] DEP   07/11/2023  9:00 AM Magali Schmitz, APRN - CNP [facility] DEP   12/25/2023 11:15 AM Sherle Dire, MD [facility]

## 2023-06-09 NOTE — Telephone Encounter (Signed)
 Sent to PCP, thank you

## 2023-06-09 NOTE — Telephone Encounter (Signed)
 Does he still follow with Dr. Hardin Leys?

## 2023-06-09 NOTE — Telephone Encounter (Signed)
 Dr Hardin Leys it OOT for 3 weeks, can patient PCP office clear her?

## 2023-06-09 NOTE — Telephone Encounter (Signed)
 Patient is scheduled for surgery with Dr.KHAN on 07/10/2023 AT [facility]. Surgery consent to be done on arrival. Dr. Hardin Leys to clear.  Patient to do pre op URINE CULTURE AND FASTING LABS ON 06/26/2023. Surgery instructions  mailed to the patient.     Patient informed an adult over the age of 103 must be with them at the time of surgery and upon discharge    DATE FROM [facility]  OR W/ JANINE

## 2023-06-09 NOTE — Telephone Encounter (Signed)
 Patient scheduled for a URONAV PROSTATE BIOPSY UNDER MAD with DR Meredeth Stallion on 07/10/2023. We are asking for clearance and direction on holding Brilinta .

## 2023-06-12 ENCOUNTER — Encounter

## 2023-06-12 MED ORDER — TAMSULOSIN HCL 0.4 MG PO CAPS
0.4 | ORAL_CAPSULE | Freq: Two times a day (BID) | ORAL | 3 refills | 30.00 days | Status: AC
Start: 2023-06-12 — End: ?

## 2023-06-12 MED ORDER — ESOMEPRAZOLE MAGNESIUM 40 MG PO CPDR
40 | ORAL_CAPSULE | ORAL | 3 refills | 90.00 days | Status: AC
Start: 2023-06-12 — End: ?

## 2023-06-12 NOTE — Telephone Encounter (Signed)
 Will see him for his pre-op  Hold Brilinta  for 5 days

## 2023-06-12 NOTE — Telephone Encounter (Signed)
 Patient does follow with Dr Hardin Leys, but Dr Hardin Leys is out of town and will not be able to give clearance prior to patients scheduled surgery.  His office asked us  to reach out to PCP for clearance.  If this is not an option, please let me know and I will reach out to the patient and reschedule.

## 2023-06-12 NOTE — Telephone Encounter (Signed)
 Recent Visits  Date Type Provider Dept   05/11/23 Office Visit Magali Schmitz, APRN - CNP [facility]   04/06/23 Office Visit Magali Schmitz, APRN - CNP [facility]   12/07/22 Office Visit Magali Schmitz, APRN - CNP [facility]   08/02/22 Office Visit Magali Schmitz, APRN - CNP [facility]   01/28/22 Office Visit Magali Schmitz, APRN - CNP [facility]   Showing recent visits within past 540 days with a meds authorizing provider and meeting all other requirements  Future Appointments  Date Type Provider Dept   06/29/23 Appointment Magali Schmitz, APRN - CNP [facility]   07/11/23 Appointment Magali Schmitz, APRN - CNP [facility]   Showing future appointments within next 150 days with a meds authorizing provider and meeting all other requirements

## 2023-06-22 ENCOUNTER — Encounter

## 2023-06-22 MED ORDER — OXYCODONE-ACETAMINOPHEN 10-325 MG PO TABS
10-325 | ORAL_TABLET | Freq: Three times a day (TID) | ORAL | 0 refills | 5.00000 days | Status: DC | PRN
Start: 2023-06-22 — End: 2023-07-24

## 2023-06-26 ENCOUNTER — Other Ambulatory Visit: Admit: 2023-06-26 | Payer: Medicare (Managed Care) | Primary: Family

## 2023-06-26 DIAGNOSIS — R972 Elevated prostate specific antigen [PSA]: Secondary | ICD-10-CM

## 2023-06-26 LAB — CBC WITH AUTO DIFFERENTIAL
Basophils Absolute: 0 10*3/uL (ref 0.0–0.1)
Basophils: 0.4 %
Eosinophils Absolute: 0.1 10*3/uL (ref 0.0–0.4)
Eosinophils: 2 %
Hematocrit: 43.1 % (ref 42.0–52.0)
Hemoglobin: 14 g/dL (ref 14.0–18.0)
Immature Grans (Abs): 0.03 10*3/uL (ref 0.00–0.07)
Immature Granulocytes %: 0.4 %
Lymphocytes Absolute: 2.1 10*3/uL (ref 1.0–4.8)
Lymphocytes: 29.7 %
MCH: 28.6 pg (ref 26.0–33.0)
MCHC: 32.5 g/dL (ref 32.2–35.5)
MCV: 88 fL (ref 80.0–94.0)
MPV: 11.9 fL (ref 9.4–12.4)
Monocytes %: 6.7 %
Monocytes Absolute: 0.5 10*3/uL (ref 0.4–1.3)
Neutrophils Absolute: 4.3 10*3/uL (ref 1.8–7.7)
Platelets: 288 10*3/uL (ref 130–400)
RBC: 4.9 10*6/uL (ref 4.70–6.10)
RDW-CV: 14.8 % — ABNORMAL HIGH (ref 11.5–14.5)
RDW-SD: 47.5 fL — ABNORMAL HIGH (ref 35.0–45.0)
Seg Neutrophils: 60.8 %
WBC: 7 10*3/uL (ref 4.8–10.8)
nRBC: 0 /100{WBCs}

## 2023-06-26 LAB — BASIC METABOLIC PANEL
BUN: 21 mg/dL (ref 8–23)
CO2: 24 meq/L (ref 22–29)
Calcium: 9.8 mg/dL (ref 8.8–10.2)
Chloride: 100 meq/L (ref 98–111)
Creatinine: 1 mg/dL (ref 0.7–1.2)
Glucose: 153 mg/dL — ABNORMAL HIGH (ref 74–109)
Potassium: 4 meq/L (ref 3.5–5.2)
Sodium: 137 meq/L (ref 135–145)

## 2023-06-26 LAB — ANION GAP: Anion Gap: 13 meq/L (ref 8.0–16.0)

## 2023-06-26 LAB — GLOMERULAR FILTRATION RATE, ESTIMATED: Est, Glom Filt Rate: 81 mL/min/{1.73_m2} (ref >60)

## 2023-06-28 LAB — CULTURE, URINE: Urine Culture, Routine: NO GROWTH

## 2023-06-29 ENCOUNTER — Ambulatory Visit: Admit: 2023-06-29 | Discharge: 2023-06-29 | Payer: Medicare (Managed Care) | Attending: Family | Primary: Family

## 2023-06-29 VITALS — BP 124/78 | HR 71 | Temp 97.40000°F | Resp 16 | Ht 67.01 in | Wt 201.0 lb

## 2023-06-29 DIAGNOSIS — Z01818 Encounter for other preprocedural examination: Secondary | ICD-10-CM

## 2023-06-29 MED ORDER — FLUTICASONE PROPIONATE 50 MCG/ACT NA SUSP
50 | Freq: Every day | NASAL | 1 refills | 60.00000 days | Status: DC
Start: 2023-06-29 — End: 2023-09-19

## 2023-06-29 NOTE — Progress Notes (Signed)
 Micheal [name] is a 68 y.o. male that presents for Pre-op Exam (EKG)        At the request of Dr. Hendrick Locke presents for pre-operative evaluation for his surgery.    Planned Surgery: uronav bx      History of Present Illness  The patient presents for a preoperative clearance appointment.    He is scheduled for a prostate biopsy and has no history of adverse reactions to anesthesia. He reports no recent cardiac events such as heart attacks or chest pain within the past 30 days. He has been informed to discontinue his anticoagulant therapy prior to the procedure. He has been advised to take an antibiotic and administer an enema 2 hours before his scheduled arrival time, but he is uncertain about the timing of these instructions. He has not yet received the enema prescription.    He experienced a significant reaction to a steroid medication in 04/2023, which resulted in tongue swelling and difficulty breathing. This incident occurred 7 days after starting the medication. He was unable to seek immediate medical attention due to scheduling conflicts. The reaction has since resolved. He also took an allergy  medication concurrently with the steroid. He has previously taken Zyrtec  without any adverse effects.    He has a history of heart failure and atrial fibrillation, with long-term shortness of breath that worsens with certain activities. He has a past medical history of stroke. He reports no history of MRSA or VRE infections, seizures, or thromboembolic events in his extremities. He does not have sleep apnea but occasionally snores loudly. He reports no peripheral edema.    He is currently on Ozempic  for diabetes management and is not on insulin.    He is currently on Paxil  for anxiety management but has expressed uncertainty about continuing this medication.    C/o issues with chronic rhinitis.  Taking Zyrtec , Flonase . Was on Singulair , didn't help.  Not seen allergist, would like to see someone.    FAMILY  HISTORY  He reports no family history of bleeding problems like hemophilia.      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    Diverticula of colon 01/10/2023    History of adenomatous polyp of colon 11/09/2022    Anxiety 10/12/2021    Arrhythmia 10/12/2021    CHF (congestive heart failure) (HCC) 10/12/2021    GERD (gastroesophageal reflux disease) 10/12/2021    MI (myocardial infarction) (HCC) 10/12/2021    Adenomatous polyp of transverse colon 12/19/2017    Angina, class III 07/04/2016    S/P cardiac cath 05/07/2016    Class 1 obesity in adult 05/07/2016    ACS (acute coronary syndrome) (HCC) 05/06/2016    Chest pain with moderate risk of acute coronary syndrome     Soft tissue neoplasm 03/30/2016    Allergic conjunctivitis of both eyes 04/22/2015    GAD (generalized anxiety disorder) 09/10/2014    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  -he does not have a family history of bleeding problems such as hemophilia, or Christmas disease, or von Willebrand disease.    PREOPERATIVE ALLERGIES QUESTION:  Allergies   Allergen Reactions    Gabapentin        I don't remember     Prednisone  Swelling     Swelling of throat and tongue         PREOPERATIVE MEDICATION QUESTION:  Current  Outpatient Medications   Medication Sig Dispense Refill    fluticasone  (FLONASE ) 50 MCG/ACT nasal spray 2 sprays by Each Nostril route daily 16 g 1    oxyCODONE -acetaminophen  (PERCOCET ) 10-325 MG per tablet Take 1 tablet by mouth every 8 hours as needed for Pain for up to 30 days. 90 tablet 0    esomeprazole  (NEXIUM ) 40 MG delayed release capsule TAKE 1 CAPSULE BY MOUTH ONCE DAILY IN THE MORNING BEFORE BREAKFAST 90 capsule 3    tamsulosin  (FLOMAX ) 0.4 MG capsule Take 1 capsule by mouth 2 times daily 180 capsule 3    ciprofloxacin  (CIPRO ) 500 MG tablet Take 1 tablet by mouth 2 times daily Take 2 tablets the day before prostate biopsy, 2 tablets the day of the biopsy, 2 tablets the day after biopsy. 6 tablet 0    metoprolol  succinate (TOPROL  XL) 25 MG extended release tablet Take 1 tablet by mouth daily 90 tablet 3    lisinopril  (PRINIVIL ;ZESTRIL ) 40 MG tablet Take 1 tablet by mouth daily 90 tablet 3    fenofibrate  (TRICOR ) 145 MG tablet Take 1 tablet by mouth daily 90 tablet 3    diazePAM  (VALIUM ) 5 MG tablet Take 1 tablet by mouth every 8 hours as needed for Anxiety for up to 90 days. Max Daily Amount: 15 mg 90 tablet 2    cetirizine  (ZYRTEC ) 10 MG tablet Take 1 tablet by mouth daily 90 tablet 3    Semaglutide , 1 MG/DOSE, 2 MG/1.5ML SOPN Inject 1 mg into the skin once a week 3 mL 5    pregabalin  (LYRICA ) 150 MG capsule Take 1 capsule by mouth in the morning, at noon, and at bedtime for 180 days. 270 capsule 1    isosorbide  mononitrate (IMDUR ) 30 MG extended release tablet Take 1 tablet by mouth daily 90 tablet 3    metFORMIN  (GLUCOPHAGE ) 1000 MG tablet Take 1 tablet by mouth 2 times daily (with meals) 180 tablet 3    atorvastatin  (LIPITOR ) 80 MG tablet Take 1 tablet by mouth daily 90 tablet 3    SYMBICORT  80-4.5 MCG/ACT AERO INHALE 2 PUFFS INTO THE LUNGS 2 TIMES DAILY 1 each 11    hydrOXYzine  HCl (ATARAX ) 25 MG tablet TAKE 1 TABLET BY MOUTH EVERY 8 HOURS AS NEEDED FOR PAIN AND  SPASMING.  TAKE  WITH  PAIN   MEDICATION 90 tablet 3    olopatadine  (PATANOL) 0.1 % ophthalmic solution Place 1 drop into both eyes 2 times daily 3 each 3    ticagrelor  (BRILINTA ) 90 MG TABS tablet Take 1 tablet by mouth twice daily 180 tablet 3    PARoxetine  (PAXIL ) 40 MG tablet Take 1 tablet by mouth daily 90 tablet 3    Lancets (ONETOUCH DELICA PLUS LANCET33G) MISC USE 1  TO CHECK GLUCOSE ONCE DAILY 100 each 0    ondansetron  (ZOFRAN -ODT) 4 MG disintegrating tablet DISSOLVE 1 TABLET IN  MOUTH THREE TIMES DAILY AS NEEDED FOR NAUSEA FOR VOMITING 21 tablet 0    montelukast  (SINGULAIR ) 10 MG tablet Take 1 tablet by mouth nightly 90 tablet 3    albuterol  sulfate HFA (PROVENTIL ;VENTOLIN ;PROAIR ) 108 (90 Base) MCG/ACT inhaler Inhale 2 puffs into the lungs every 6 hours as needed for Wheezing 18 g 3    glimepiride  (AMARYL ) 2 MG tablet Take 1 tablet by mouth twice daily 180 tablet 3    hydrocortisone  2.5 % cream Apply to external ear BID PRN 3 each 3    blood glucose monitor kit and supplies Dispense sufficient amount for indicated testing frequency plus additional to accommodate PRN testing needs. Dispense all needed supplies to include: monitor, strips, lancing device, lancets, control solutions, alcohol swabs. 1 kit 0    blood glucose monitor strips Test 1 times a day & as needed for symptoms of irregular blood glucose. Dispense sufficient amount for indicated testing frequency plus additional to accommodate PRN testing needs. 300 strip 0    pioglitazone  (ACTOS ) 15 MG tablet Take 1 tablet by mouth once daily 90 tablet 3    Handicap Placard MISC by Does not apply route Expires 06/30/25 1 each 0    NARCAN  4 MG/0.1ML LIQD nasal spray ADMINISTER A SINGLE SPRAY INTRANASALLY INTO ONE NOSTRIL. CALL 911. MAY REPEAT X 1.      nitroGLYCERIN  (NITROSTAT ) 0.4 MG SL tablet Place 1 tablet under the tongue every 5 minutes as needed for Chest pain 25 tablet 2     No current facility-administered medications for this visit.         CARDIAC RISK FACTORS  he does not have  a history of UA or MI within that past 30 days  he  does have CHF   he does have a history of cardiac arrhythmia   he denies chest pain, exertional dyspnea, orthopnea, palpitations or LE edema.    he can walk up 1 flight of stairs or 8 steps without stopping (4 METS)    he does have a history of CAD  he does have a history of CHF  he does not have a history of CVA or TIA  he does not have a history of DM requiring insulin  he does not have a history of Renal insufficiency (Cr > 2.0)    EKG reveals sinus rhythm with a rate of 67.  There are not ST segment changes.    [email or web address].org/[email or web address]    PREOPERATIVE REVIEW OF SYSTEMS:  he does not have a history of MRSA or VRE.  he does not have a history of seizures.  he does not have a history of phlebitis or blood clots in arms or legs.  he does not have a history of sleep apnea.  he  does not have a history of snoring loudly enough to be heard through a closed door.      PHYSICAL EXAM:  Blood pressure 124/78, pulse 71, temperature 97.4 F (36.3 C), temperature source Oral, resp. rate 16, height 1.702 m (5' 7.01), weight 91.2 kg (201 lb), SpO2 95%.  GEN: No acute distress  HEENT:  NCAT, PERRL, EOMI, Nares clear, turbinates pink, mucosa is moist.  Oropharynx  is clear.  Hearing grossly intact.   Dentition is normal for age.    Neck: No lymphadenopathy or masses.  Thyroid not palpable; no nodules or masses.    Heart: RRR. S1 and S2 normal, no murmurs, clicks, gallops or rubs. No carotid bruits noted.  Lungs:  CTAB,  No wheezing, ronchi, or rales. Normal symmetric air entry throughout both lung fields.  Abdomen:  Soft, non tender, non distended.  No rebound or guarding.  No organomegaly.    Extremities:  No gross deformity, erythema or edema of the lower extremities.  Skin: No pathologic lesions or significant rash.     Psych:  Affect appropriate.  Thought process is normal without evidence of depression or psychosis.    Good insight and  appropriae interaction.  Cognition and memory appear to be intact.    ASSESSMENT & PLAN     Assessment & Plan  Pre-op exam    Labs, vitals, EKG, exam stable    Orders:    EKG 12 Lead - Clinic Performed    Chronic non-seasonal allergic rhinitis     Orders:    fluticasone  (FLONASE ) 50 MCG/ACT nasal spray; 2 sprays by Each Nostril route daily    [place] [facility] [place], [place], [place], [facility] , [place]      No follow-ups on file.       Per 2014 ACC/AHA guidelines, patient may proceed with planned surgery.  Patient has a Goldman Cardiac Risk Index score of 2 indicating 6.6% chance of perioperative cardiac complications.  he will require standard VTE prophylaxis.      Http://annals.org/article.aspx?articleid=[email or web address]  [email or web address]_[email or web address]

## 2023-06-30 NOTE — Telephone Encounter (Signed)
 CALLED PATIENT TO GO OVER INSTRUCTIONS FOR [facility]EMA, STARTING ANTIBIOTIC AND HOLDING MEDICATIONS PRIOR TO SURGERY

## 2023-07-03 ENCOUNTER — Encounter

## 2023-07-03 MED ORDER — TICAGRELOR 90 MG PO TABS
90 | ORAL_TABLET | ORAL | 3 refills | 30.00000 days | Status: AC
Start: 2023-07-03 — End: ?

## 2023-07-03 MED ORDER — PAROXETINE HCL 40 MG PO TABS
40 | ORAL_TABLET | Freq: Every day | ORAL | 3 refills | 90.00000 days | Status: AC
Start: 2023-07-03 — End: ?

## 2023-07-03 NOTE — Telephone Encounter (Signed)
 Recent Visits  Date Type Provider Dept   06/29/23 Office Visit Magali Schmitz, APRN - CNP [facility]   05/11/23 Office Visit Magali Schmitz, APRN - CNP [facility]   04/06/23 Office Visit Magali Schmitz, APRN - CNP [facility]   12/07/22 Office Visit Magali Schmitz, APRN - CNP [facility]   08/02/22 Office Visit Magali Schmitz, APRN - CNP [facility]   01/28/22 Office Visit Magali Schmitz, APRN - CNP [facility]   Showing recent visits within past 540 days with a meds authorizing provider and meeting all other requirements  Future Appointments  Date Type Provider Dept   07/11/23 Appointment Magali Schmitz, APRN - CNP [facility]   Showing future appointments within next 150 days with a meds authorizing provider and meeting all other requirements

## 2023-07-06 NOTE — Telephone Encounter (Signed)
 Patient cancelled upcoming [facility] at [facility] for 07/10/23 with Dr Meredeth Stallion. The patient is sick. Will have Ferol Hoyles reach out to reschedule.       Tori at [facility] OR notified    Kayleen Party notified

## 2023-07-11 ENCOUNTER — Ambulatory Visit: Admit: 2023-07-11 | Discharge: 2023-07-11 | Payer: Medicare (Managed Care) | Attending: Family | Primary: Family

## 2023-07-11 VITALS — BP 124/82 | HR 89 | Temp 97.60000°F | Resp 18 | Ht 67.0 in | Wt 201.8 lb

## 2023-07-11 DIAGNOSIS — J069 Acute upper respiratory infection, unspecified: Secondary | ICD-10-CM

## 2023-07-11 LAB — POCT GLYCOSYLATED HEMOGLOBIN (HGB A1C): Hemoglobin A1C POC: 6.6 % — ABNORMAL HIGH (ref 4.3–5.7)

## 2023-07-11 MED ORDER — ALBUTEROL SULFATE (2.5 MG/3ML) 0.083% IN NEBU
Freq: Four times a day (QID) | RESPIRATORY_TRACT | 3 refills | 25.00000 days | Status: DC | PRN
Start: 2023-07-11 — End: 2023-11-08

## 2023-07-11 MED ORDER — DOXYCYCLINE HYCLATE 100 MG PO TABS
100 | ORAL_TABLET | Freq: Two times a day (BID) | ORAL | 0 refills | 7.00000 days | Status: AC
Start: 2023-07-11 — End: 2023-07-21

## 2023-07-11 MED ORDER — ALBUTEROL SULFATE (2.5 MG/3ML) 0.083% IN NEBU
Freq: Once | RESPIRATORY_TRACT | Status: AC
Start: 2023-07-11 — End: 2023-07-11
  Administered 2023-07-11: 16:00:00 2.5 mg via RESPIRATORY_TRACT

## 2023-07-11 MED ORDER — METHYLPREDNISOLONE ACETATE 80 MG/ML IJ SUSP
80 | Freq: Once | INTRAMUSCULAR | Status: AC
Start: 2023-07-11 — End: 2023-07-11
  Administered 2023-07-11: 14:00:00 60 mg via INTRAMUSCULAR

## 2023-07-11 MED ORDER — ALBUTEROL SULFATE HFA 108 (90 BASE) MCG/ACT IN AERS
108 | Freq: Four times a day (QID) | RESPIRATORY_TRACT | 3 refills | 25.00000 days | Status: AC | PRN
Start: 2023-07-11 — End: ?

## 2023-07-11 MED ORDER — ALBUTEROL SULFATE 1.25 MG/3ML IN NEBU
1.25 MG/3ML | Freq: Once | RESPIRATORY_TRACT | Status: DC
Start: 2023-07-11 — End: 2023-07-14

## 2023-07-11 NOTE — Telephone Encounter (Signed)
 Patient needs a script for the machine for nebulizer     Ok to go to [facility] on [address]

## 2023-07-11 NOTE — Telephone Encounter (Signed)
 Nebulizer order was faxed to [facility].    Left message on answering machine requesting pt to call back at earliest convenience.

## 2023-07-11 NOTE — Telephone Encounter (Signed)
 Call pt let him know I sent in neb solution and machine order was placed  Needs to get that from [facility]  Continue Symbicort  inhaler twice a day (maintenance inhaler, take this twice a day no matter how you feel)  I also sent in ALbuterol  inhaler so he can have a rescue inhaler on him when he leaves the house  When he is home doesn't need to use the albuterol  inhaler can use the albuterol  solution with the nebulizer machine instead

## 2023-07-11 NOTE — Progress Notes (Signed)
 Micheal [name] is a 68 y.o. male thatpresents for Follow-up (2 month), Cough, and Wheezing      History obtained today from Patient.    History of Present Illness  The patient presents for evaluation of cough and shortness of breath.    He has been experiencing symptoms for the past 2 weeks, which have progressively worsened. He reports no fever or chills. He has been using his Symbicort  and albuterol  inhalers consistently, which provide some relief. He used his albuterol  inhaler last night. He does not own a nebulizer machine at home. He has no history of pneumonia. He reports normal bowel movements but has been coughing frequently. He experiences shortness of breath during physical exertion, such as walking from his car to the clinic. He has been largely inactive at home due to his cough and is seeking to resolve it before undergoing a scheduled procedure. He is currently taking Robitussin DM for his cough, which provides some relief. He also uses Flonase  for his runny nose and takes cetirizine  for allergies. He recalls a previous incident where he took a steroid pill, which resulted in tongue swelling the following morning. He is uncertain about receiving steroid injections in the past. He also experienced throat discomfort following the steroid pill incident.          I have reviewed the patient's past medical history, past surgical history, allergies,medications, social and family history and I have made updates where appropriate.    Past Medical History:   Diagnosis Date    Arthritis     general    CAD (coronary artery disease)     s/p stent placement-sees Dr. Pearley    Chronic back pain     Diabetes mellitus (HCC)     GERD (gastroesophageal reflux disease)     Hyperlipidemia     Hypertension        Social History     Tobacco Use    Smoking status: Former     Current packs/day: 0.00     Average packs/day: 1 pack/day for 15.0 years (15.0 ttl pk-yrs)     Types: Cigarettes     Start date: 05/24/1993     Quit date:  05/24/2008     Years since quitting: 15.1    Smokeless tobacco: Never   Vaping Use    Vaping status: Never Used   Substance Use Topics    Alcohol use: Yes     Alcohol/week: 1.0 standard drink of alcohol     Types: 1 Glasses of wine per week     Comment: once monthly stopped a over 2 years    Drug use: No       Family History   Problem Relation Age of Onset    Cancer Mother     Heart Disease Brother     Stroke Brother     Diabetes Brother     Early Death Brother     High Blood Pressure Brother     High Cholesterol Brother     Mental Retardation Sister          Review of Systems        PHYSICAL EXAM:  BP 124/82   Pulse 89   Temp 97.6 F (36.4 C)   Resp 18   Ht 1.702 m (5' 7)   Wt 91.5 kg (201 lb 12.8 oz)   SpO2 98%   BMI 31.61 kg/m     Physical Exam  Constitutional:  Appearance: Normal appearance.   HENT:      Head: Normocephalic and atraumatic.      Right Ear: External ear normal.      Left Ear: External ear normal.      Nose: Nose normal.      Mouth/Throat:      Mouth: Mucous membranes are moist.   Eyes:      Conjunctiva/sclera: Conjunctivae normal.   Cardiovascular:      Rate and Rhythm: Normal rate and regular rhythm.      Pulses: Normal pulses.      Heart sounds: Normal heart sounds.   Pulmonary:      Effort: Pulmonary effort is normal.      Breath sounds: Wheezing present.   Abdominal:      General: Bowel sounds are normal.      Palpations: Abdomen is soft.   Musculoskeletal:         General: Normal range of motion.      Cervical back: Normal range of motion.   Skin:     General: Skin is warm and dry.   Neurological:      General: No focal deficit present.      Mental Status: He is alert and oriented to person, place, and time.   Psychiatric:         Mood and Affect: Mood normal.         Behavior: Behavior normal.           ASSESSMENT & PLAN  Davy was seen today for follow-up, cough and wheezing.    Diagnoses and all orders for this visit:    Upper respiratory tract infection, unspecified type  -      methylPREDNISolone  acetate (DEPO-MEDROL ) injection 60 mg  -     doxycycline  hyclate (VIBRA -TABS) 100 MG tablet; Take 1 tablet by mouth 2 times daily for 10 days  -     DME Order for Nebulizer as OP  -     albuterol  (PROVENTIL ) (2.5 MG/3ML) 0.083% nebulizer solution; Take 3 mLs by nebulization 4 times daily as needed for Wheezing  -     Discontinue: albuterol  (ACCUNEB ) nebulizer solution 1.25 mg  -     albuterol  (PROVENTIL ) (2.5 MG/3ML) 0.083% nebulizer solution 2.5 mg    SOB (shortness of breath)  -     methylPREDNISolone  acetate (DEPO-MEDROL ) injection 60 mg  -     doxycycline  hyclate (VIBRA -TABS) 100 MG tablet; Take 1 tablet by mouth 2 times daily for 10 days  -     DME Order for Nebulizer as OP  -     albuterol  (PROVENTIL ) (2.5 MG/3ML) 0.083% nebulizer solution; Take 3 mLs by nebulization 4 times daily as needed for Wheezing  -     Discontinue: albuterol  (ACCUNEB ) nebulizer solution 1.25 mg  -     albuterol  (PROVENTIL ) (2.5 MG/3ML) 0.083% nebulizer solution 2.5 mg    History of asthma  -     methylPREDNISolone  acetate (DEPO-MEDROL ) injection 60 mg  -     doxycycline  hyclate (VIBRA -TABS) 100 MG tablet; Take 1 tablet by mouth 2 times daily for 10 days  -     albuterol  sulfate HFA (PROVENTIL ;VENTOLIN ;PROAIR ) 108 (90 Base) MCG/ACT inhaler; Inhale 2 puffs into the lungs every 6 hours as needed for Wheezing  -     DME Order for Nebulizer as OP  -     albuterol  (PROVENTIL ) (2.5 MG/3ML) 0.083% nebulizer solution; Take 3 mLs by nebulization 4 times daily as needed for  Wheezing  -     Discontinue: albuterol  (ACCUNEB ) nebulizer solution 1.25 mg  -     albuterol  (PROVENTIL ) (2.5 MG/3ML) 0.083% nebulizer solution 2.5 mg    Wheezing  -     methylPREDNISolone  acetate (DEPO-MEDROL ) injection 60 mg  -     doxycycline  hyclate (VIBRA -TABS) 100 MG tablet; Take 1 tablet by mouth 2 times daily for 10 days  -     albuterol  sulfate HFA (PROVENTIL ;VENTOLIN ;PROAIR ) 108 (90 Base) MCG/ACT inhaler; Inhale 2 puffs into the lungs every 6  hours as needed for Wheezing  -     DME Order for Nebulizer as OP  -     albuterol  (PROVENTIL ) (2.5 MG/3ML) 0.083% nebulizer solution; Take 3 mLs by nebulization 4 times daily as needed for Wheezing  -     Discontinue: albuterol  (ACCUNEB ) nebulizer solution 1.25 mg  -     albuterol  (PROVENTIL ) (2.5 MG/3ML) 0.083% nebulizer solution 2.5 mg    Acute cough  -     doxycycline  hyclate (VIBRA -TABS) 100 MG tablet; Take 1 tablet by mouth 2 times daily for 10 days  -     Discontinue: albuterol  (ACCUNEB ) nebulizer solution 1.25 mg  -     albuterol  (PROVENTIL ) (2.5 MG/3ML) 0.083% nebulizer solution 2.5 mg    Other orders  -     POCT glycosylated hemoglobin (Hb A1C)        No follow-ups on file.    Start above treatments    All copied or forwarded information in the progress note was verified by me to be accurate at the time of visit  Patient's past medical, surgical, social and family history were reviewed and updated     All patient questions answered.  Patient voiced understanding.     The patient (or guardian, if applicable) and other individuals in attendance with the patient were advised that Artificial Intelligence will be utilized during this visit to record, process the conversation to generate a clinical note, and support improvement of the AI technology. The patient (or guardian, if applicable) and other individuals in attendance at the appointment consented to the use of AI, including the recording.

## 2023-07-11 NOTE — Telephone Encounter (Signed)
 Left message on answering machine requesting pt to call back at earliest convenience.

## 2023-07-11 NOTE — Telephone Encounter (Signed)
 Left voicemail for patient regarding rescheduling biopsy

## 2023-07-12 NOTE — Telephone Encounter (Signed)
 Patient stated he is doing much better. He is going to pick up his nebulizer today.

## 2023-07-12 NOTE — Telephone Encounter (Signed)
-----   Message from Myrtice Athens, APRN - CNP sent at 07/12/2023  7:27 AM EDT -----    ----- Message -----  From: Magali Schmitz, APRN - CNP  Sent: 07/12/2023   7:00 AM EDT  To: Magali Schmitz, APRN - CNP    Please call pt and see how he did overnight  Using albuterol  inhaler  Cough medicine helping

## 2023-07-12 NOTE — Telephone Encounter (Signed)
 Patient informed and verbalized understanding

## 2023-07-14 ENCOUNTER — Ambulatory Visit: Admit: 2023-07-14 | Discharge: 2023-07-14 | Payer: Medicare (Managed Care) | Attending: Family | Primary: Family

## 2023-07-14 VITALS — BP 106/70 | HR 93 | Temp 97.00000°F | Resp 16 | Ht 67.0 in | Wt 201.6 lb

## 2023-07-14 DIAGNOSIS — J069 Acute upper respiratory infection, unspecified: Secondary | ICD-10-CM

## 2023-07-14 NOTE — Progress Notes (Signed)
 BECKHAM CAPISTRAN is a 68 y.o. male thatpresents for Follow-up      History obtained today from Patient.    History of Present Illness  The patient presents for evaluation of pneumonia.    He has been utilizing a nebulizer twice daily, which he reports as beneficial in alleviating his symptoms. His cough has shown improvement. He has been on doxycycline  and received a steroid injection, both of which he tolerated well. He experienced a severe episode of shortness of breath and wheezing that lasted for approximately 2 weeks. He has completed his pneumonia vaccine series.    Patient reports he is feeling much better, symptoms have essentially resolved.          I have reviewed the patient's past medical history, past surgical history, allergies,medications, social and family history and I have made updates where appropriate.    Past Medical History:   Diagnosis Date    Arthritis     general    CAD (coronary artery disease)     s/p stent placement-sees Dr. Pearley    Chronic back pain     Diabetes mellitus (HCC)     GERD (gastroesophageal reflux disease)     Hyperlipidemia     Hypertension        Social History     Tobacco Use    Smoking status: Former     Current packs/day: 0.00     Average packs/day: 1 pack/day for 15.0 years (15.0 ttl pk-yrs)     Types: Cigarettes     Start date: 05/24/1993     Quit date: 05/24/2008     Years since quitting: 15.1    Smokeless tobacco: Never   Vaping Use    Vaping status: Never Used   Substance Use Topics    Alcohol use: Yes     Alcohol/week: 1.0 standard drink of alcohol     Types: 1 Glasses of wine per week     Comment: once monthly stopped a over 2 years    Drug use: No       Family History   Problem Relation Age of Onset    Cancer Mother     Heart Disease Brother     Stroke Brother     Diabetes Brother     Early Death Brother     High Blood Pressure Brother     High Cholesterol Brother     Mental Retardation Sister          Review of Systems        PHYSICAL EXAM:  BP 106/70   Pulse 93    Temp 97 F (36.1 C)   Resp 16   Ht 1.702 m (5' 7)   Wt 91.4 kg (201 lb 9.6 oz)   SpO2 95%   BMI 31.58 kg/m     Physical Exam  Constitutional:       Appearance: Normal appearance.   HENT:      Head: Normocephalic and atraumatic.      Right Ear: External ear normal.      Left Ear: External ear normal.      Nose: Nose normal.      Mouth/Throat:      Mouth: Mucous membranes are moist.   Eyes:      Conjunctiva/sclera: Conjunctivae normal.   Cardiovascular:      Rate and Rhythm: Normal rate and regular rhythm.      Pulses: Normal pulses.      Heart sounds: Normal heart  sounds.   Pulmonary:      Effort: Pulmonary effort is normal.      Breath sounds: Normal breath sounds.   Abdominal:      General: Bowel sounds are normal.      Palpations: Abdomen is soft.   Musculoskeletal:         General: Normal range of motion.      Cervical back: Normal range of motion.   Skin:     General: Skin is warm and dry.   Neurological:      General: No focal deficit present.      Mental Status: He is alert and oriented to person, place, and time.   Psychiatric:         Mood and Affect: Mood normal.         Behavior: Behavior normal.           ASSESSMENT & PLAN  Arsalan was seen today for follow-up.    Diagnoses and all orders for this visit:    Upper respiratory tract infection, unspecified type    SOB (shortness of breath)    History of asthma    Wheezing    Acute cough      Continue full course of meds  He would like us  to update Urology that he is doing better so he can be rescheduled for his procedure, will cc them on this visit  Call with any concerns    No follow-ups on file.        All copied or forwarded information in the progress note was verified by me to be accurate at the time of visit  Patient's past medical, surgical, social and family history were reviewed and updated     All patient questions answered.  Patient voiced understanding.     The patient (or guardian, if applicable) and other individuals in attendance with  the patient were advised that Artificial Intelligence will be utilized during this visit to record, process the conversation to generate a clinical note, and support improvement of the AI technology. The patient (or guardian, if applicable) and other individuals in attendance at the appointment consented to the use of AI, including the recording.          I spent 20+ minutes reviewing previous notes and testing, discussing symptoms, medications and side effects, treatment options with the patient, performing an exam, and developing a plan of care.  All questions answered.  Patient verbalized understanding.

## 2023-07-14 NOTE — Patient Instructions (Signed)
 Continue Symbicort  inhaler two puffs twice a day, will continue this long term  Continue Albuterol  nebulizer every 4 hours if needed for shortness of breath or wheezing

## 2023-07-19 ENCOUNTER — Encounter

## 2023-07-19 NOTE — Telephone Encounter (Signed)
 DO NOT TAKE  FISH OIL, MOBIC, IBUPROFEN, MOTRIN-LIKE DRUGS AND ANY MULTIVITAMINS OR OVER THE COUNTER SUPPLEMENTS 14 DAYS PRIOR TO SURGERY.    HOLD BRILINTA  5 DAYS PRIOR TO SURGERY  HOLD SEMAGLUTIDE  1 WEEK PRIOR TO SURGERY  HOLD LISINOPRIL  AND GLIMEPIRIDE  THE MORNING OF SURGERY  HOLD METFORMIN  2 DAYS PRIOR TO SURGERY    MUST HAVE AN ADULT OVER THE AGE OF 18 WITH YOU AT THE TIME OF THE DISCHARGE         Micheal [name] [birth year]     Surgical Physician: Dr. Fernand Lanius have been scheduled for the procedure marked below:      Surgery: MRI FUSION GUIDED PROSTATE BIOPSY         Date: 08/28/2023     Anesthesia:  MAC     Place of Service: [facility] [address] [place], [place]  **[place] [phone] [id number], THE FRIDAY PRIOR TO SURGERY, AFTER 8:30AM, FOR ARRIVAL TIME**      INSTRUCTIONS AS MARKED BELOW:    1.  DO NOT eat or drink anything after midnight before surgery.   2.  We prefer you shower or bathe with an antibacterial soap (Dial) the morning of surgery.  3  Please bring a current medication list, photo ID and insurance card(s) with you  4. Okay to take Tylenol   5. Take blood pressure or heart medication as directed, if taken in the morning take with a small sip of water  6.The office will call you in 1-2 days after your procedure to schedule a follow up.  7. START ANTIBIOTICS ON 08/27/2023 AS PRESCRIBED  8. DO FLEETS ENEMA 2 HOUR PRIOR TO ARRIVAL TIME    DATE SENSITIVE PRE OP TESTING:    TO AVOID YOUR SURGERY BEING CANCELLED, DO TESTING ON THE DATE LISTED (*WALK IN* NO APPOINTMENT NEEDED).      DO URINE CULTURE ON 08/14/2023        Date: 07/19/2023

## 2023-07-19 NOTE — Telephone Encounter (Signed)
 Patient is scheduled for surgery with Dr.KHAN on 08/28/2023 AT [facility]. Surgery consent to be done on arrival. Dr. DELON PESA to clear. Patient to do pre op URINE CULTURE ON 08/14/2023. Surgery instructions mailed to the patient.     Patient informed an adult over the age of 64 must be with them at the time of surgery and upon discharge    DATE FROM [facility]  SCHEDULED IN OR WITH TORI

## 2023-07-24 ENCOUNTER — Encounter

## 2023-07-24 MED ORDER — DIAZEPAM 5 MG PO TABS
5 | ORAL_TABLET | Freq: Three times a day (TID) | ORAL | 2 refills | 9.00000 days | Status: AC | PRN
Start: 2023-07-24 — End: 2023-10-22

## 2023-07-24 MED ORDER — OXYCODONE-ACETAMINOPHEN 10-325 MG PO TABS
10-325 | ORAL_TABLET | Freq: Three times a day (TID) | ORAL | 0 refills | 5.00000 days | Status: DC | PRN
Start: 2023-07-24 — End: 2023-08-21

## 2023-07-24 NOTE — Telephone Encounter (Signed)
 Recent Visits  Date Type Provider Dept   07/14/23 Office Visit Antonetta Delon BRAVO, APRN - CNP [facility]   07/11/23 Office Visit Antonetta Delon BRAVO, APRN - CNP [facility]   06/29/23 Office Visit Antonetta Delon BRAVO, APRN - CNP [facility]   05/11/23 Office Visit Antonetta Delon BRAVO, APRN - CNP [facility]   04/06/23 Office Visit Antonetta Delon BRAVO, APRN - CNP [facility]   12/07/22 Office Visit Antonetta Delon BRAVO, APRN - CNP [facility]   08/02/22 Office Visit Antonetta Delon BRAVO, APRN - CNP [facility]   Showing recent visits within past 540 days with a meds authorizing provider and meeting all other requirements  Future Appointments  No visits were found meeting these conditions.  Showing future appointments within next 150 days with a meds authorizing provider and meeting all other requirements

## 2023-08-08 NOTE — Telephone Encounter (Signed)
 Patient called in asking for another prednisone  injection that he got back on 07/11/2023. He got it for his upper respiratory but it also help with his right leg pain. He states he had no more leg pain. But it wore off and is bad leg pain.     Please advise

## 2023-08-08 NOTE — Telephone Encounter (Signed)
 Future Appointments   Date Time Provider Department Center   08/15/2023  9:00 AM Antonetta Delon BRAVO, APRN - CNP [facility]   12/25/2023 11:15 AM Aurea Tawanna LABOR, MD N [facility] [place]   01/16/2024  1:00 PM Antonetta Delon BRAVO, APRN - CNP [facility]

## 2023-08-08 NOTE — Telephone Encounter (Signed)
 Would recommend apt 1st to specifically address the leg pain.  I don't see where this was discussed prior.   Not a good idea to keep masking that pain with steroids w/o a more formal f/u plan

## 2023-08-15 ENCOUNTER — Ambulatory Visit: Admit: 2023-08-15 | Discharge: 2023-08-15 | Payer: Medicare (Managed Care) | Attending: Family | Primary: Family

## 2023-08-15 ENCOUNTER — Other Ambulatory Visit: Admit: 2023-08-15 | Payer: Medicare (Managed Care) | Primary: Family

## 2023-08-15 VITALS — BP 114/72 | HR 80 | Temp 97.50000°F | Resp 16 | Ht 67.0 in | Wt 198.8 lb

## 2023-08-15 DIAGNOSIS — M5441 Lumbago with sciatica, right side: Principal | ICD-10-CM

## 2023-08-15 DIAGNOSIS — G8929 Other chronic pain: Secondary | ICD-10-CM

## 2023-08-15 DIAGNOSIS — R972 Elevated prostate specific antigen [PSA]: Principal | ICD-10-CM

## 2023-08-15 MED ORDER — METHYLPREDNISOLONE ACETATE 80 MG/ML IJ SUSP
80 | Freq: Once | INTRAMUSCULAR | Status: AC
Start: 2023-08-15 — End: 2023-08-15
  Administered 2023-08-15: 14:00:00 80 mg via INTRAMUSCULAR

## 2023-08-15 NOTE — Progress Notes (Signed)
 Micheal [name] is a 68 y.o. male thatpresents for Leg Pain (Right)      History obtained today from Patient.    History of Present Illness  The patient presents for evaluation of leg pain.    He reports experiencing neuropathic pain in his leg, which he describes as shooting downwards. The pain is localized to one side and does not affect the other. He has not experienced any changes in bowel or bladder function since the onset of this pain. He also reports no recent falls or injuries, and no new numbness or tingling in the affected leg. He does not feel any weakness in the leg, only pain. He has not noticed any numbness under his buttocks when sitting since the onset of the back pain. He attempts to stretch his leg regularly and finds that applying ice provides some relief. However, he often falls asleep while lying in bed with the ice applied. He finds it difficult to move around when lying down due to the pain. He tries to stay active but finds it challenging when the pain intensifies. He is able to walk and move his legs without difficulty, but the pain is severe, particularly in the mornings. He does not take any turmeric or glucosamine supplements and has been advised to discontinue his multivitamin supplement. He recalls receiving a steroid injection during a previous visit for pneumonia, which significantly alleviated his leg pain. This improvement lasted for two weeks, during which he was also taking medication. He continues to take Valium  several times a day, which he believes helps with the pain.    He still has a runny nose, but it is not as severe as before. He has been using a machine to help with his breathing and occasionally coughs up phlegm.    PAST SURGICAL HISTORY:  He reports having had back surgery in the past, which worsened his condition.          I have reviewed the patient's past medical history, past surgical history, allergies,medications, social and family history and I have made updates  where appropriate.    Past Medical History:   Diagnosis Date    Arthritis     general    CAD (coronary artery disease)     s/p stent placement-sees Dr. Pearley    Chronic back pain     Diabetes mellitus (HCC)     GERD (gastroesophageal reflux disease)     Hyperlipidemia     Hypertension        Social History     Tobacco Use    Smoking status: Former     Current packs/day: 0.00     Average packs/day: 1 pack/day for 15.0 years (15.0 ttl pk-yrs)     Types: Cigarettes     Start date: 05/24/1993     Quit date: 05/24/2008     Years since quitting: 15.2    Smokeless tobacco: Never   Vaping Use    Vaping status: Never Used   Substance Use Topics    Alcohol use: Yes     Alcohol/week: 1.0 standard drink of alcohol     Types: 1 Glasses of wine per week     Comment: once monthly stopped a over 2 years    Drug use: No       Family History   Problem Relation Age of Onset    Cancer Mother     Heart Disease Brother     Stroke Brother     Diabetes  Brother     Early Death Brother     High Blood Pressure Brother     High Cholesterol Brother     Mental Retardation Sister          Review of Systems        PHYSICAL EXAM:  BP 114/72   Pulse 80   Temp 97.5 F (36.4 C)   Resp 16   Ht 1.702 m (5' 7)   Wt 90.2 kg (198 lb 12.8 oz)   SpO2 96%   BMI 31.14 kg/m     Physical Exam  Constitutional:       Appearance: Normal appearance.   HENT:      Head: Normocephalic and atraumatic.      Right Ear: External ear normal.      Left Ear: External ear normal.      Nose: Nose normal.      Mouth/Throat:      Mouth: Mucous membranes are moist.   Eyes:      Conjunctiva/sclera: Conjunctivae normal.   Cardiovascular:      Rate and Rhythm: Normal rate and regular rhythm.      Pulses: Normal pulses.      Heart sounds: Normal heart sounds.   Pulmonary:      Effort: Pulmonary effort is normal.      Breath sounds: Normal breath sounds.   Abdominal:      General: Bowel sounds are normal.      Palpations: Abdomen is soft.   Musculoskeletal:         General: Normal  range of motion.      Cervical back: Normal range of motion.      Lumbar back: Tenderness present.        Legs:    Skin:     General: Skin is warm and dry.   Neurological:      General: No focal deficit present.      Mental Status: He is alert and oriented to person, place, and time.   Psychiatric:         Mood and Affect: Mood normal.         Behavior: Behavior normal.           ASSESSMENT & PLAN  Rasul was seen today for leg pain.    Diagnoses and all orders for this visit:    Chronic midline low back pain with right-sided sciatica  -     methylPREDNISolone  acetate (DEPO-MEDROL ) injection 80 mg    Will call and check on his pain later this week    Discussed long term treatment options- PT, [facility] referral, Dr. Luke referral  Ice   Rest   Ambulate frequently  Continue topicals  Start sciatic stretches we talked about    No follow-ups on file.    Start above treatments    All copied or forwarded information in the progress note was verified by me to be accurate at the time of visit  Patient's past medical, surgical, social and family history were reviewed and updated     All patient questions answered.  Patient voiced understanding.     The patient (or guardian, if applicable) and other individuals in attendance with the patient were advised that Artificial Intelligence will be utilized during this visit to record, process the conversation to generate a clinical note, and support improvement of the AI technology. The patient (or guardian, if applicable) and other individuals in attendance at the appointment consented to the use of  AI, including the recording.

## 2023-08-15 NOTE — Patient Instructions (Addendum)
 Look into Turmeric supplement    Look into sciatic stretches online

## 2023-08-17 LAB — CULTURE, URINE: Urine Culture, Routine: NO GROWTH

## 2023-08-18 NOTE — Telephone Encounter (Signed)
 Noted, thank you

## 2023-08-18 NOTE — Telephone Encounter (Signed)
-----   Message from Delon Pesa, APRN - CNP sent at 08/18/2023  7:03 AM EDT -----    ----- Message -----  From: Pesa Delon BRAVO, APRN - CNP  Sent: 08/18/2023   7:00 AM EDT  To: Delon BRAVO Pesa, APRN - CNP    Check on lower back and right buttock and right leg pain

## 2023-08-18 NOTE — Telephone Encounter (Signed)
 Spoke with pt he is doing a lot better. Very happy with the shot! Will call if anything changes.

## 2023-08-21 ENCOUNTER — Encounter

## 2023-08-21 MED ORDER — OXYCODONE-ACETAMINOPHEN 10-325 MG PO TABS
10-325 | ORAL_TABLET | Freq: Three times a day (TID) | ORAL | 0 refills | 7.00000 days | Status: DC | PRN
Start: 2023-08-21 — End: 2023-09-19

## 2023-08-21 NOTE — Telephone Encounter (Signed)
 I reviewed an OAR[facility] report on patient today.  There were no abnormal findings on the report.  Rx sent  Electronically signed by Delon FORBES Pesa, APRN - CNP on 08/21/23 at 11:41 AM EDT

## 2023-08-21 NOTE — Telephone Encounter (Signed)
 Recent Visits  Date Type Provider Dept   08/15/23 Office Visit Antonetta Delon BRAVO, APRN - CNP [facility]   07/14/23 Office Visit Antonetta Delon BRAVO, APRN - CNP [facility]   07/11/23 Office Visit Antonetta Delon BRAVO, APRN - CNP [facility]   06/29/23 Office Visit Antonetta Delon BRAVO, APRN - CNP [facility]   05/11/23 Office Visit Antonetta Delon BRAVO, APRN - CNP [facility]   04/06/23 Office Visit Antonetta Delon BRAVO, APRN - CNP [facility]   12/07/22 Office Visit Antonetta Delon BRAVO, APRN - CNP [facility]   08/02/22 Office Visit Antonetta Delon BRAVO, APRN - CNP [facility]   Showing recent visits within past 540 days with a meds authorizing provider and meeting all other requirements  Future Appointments  Date Type Provider Dept   01/16/24 Appointment Antonetta Delon BRAVO, APRN - CNP [facility]   Showing future appointments within next 150 days with a meds authorizing provider and meeting all other requirements

## 2023-08-22 ENCOUNTER — Encounter

## 2023-08-22 MED ORDER — NITROGLYCERIN 0.4 MG SL SUBL
0.4 | ORAL_TABLET | SUBLINGUAL | 2 refills | 8.00000 days | Status: AC | PRN
Start: 2023-08-22 — End: ?

## 2023-08-22 MED ORDER — FENOFIBRATE 145 MG PO TABS
145 | ORAL_TABLET | Freq: Every day | ORAL | 3 refills | 90.00000 days | Status: AC
Start: 2023-08-22 — End: ?

## 2023-08-22 NOTE — Telephone Encounter (Signed)
 Patient called into the office requesting  medication refill be sent for Nitro and Tricor , both medications pended. Pharm listed as [facility] on [address]    Encounter routed to provider for review.

## 2023-08-23 ENCOUNTER — Encounter

## 2023-08-24 MED ORDER — ONDANSETRON 4 MG PO TBDP
4 | ORAL_TABLET | ORAL | 0 refills | 7.00000 days | Status: AC
Start: 2023-08-24 — End: ?

## 2023-08-24 NOTE — Telephone Encounter (Signed)
 Recent Visits  Date Type Provider Dept   08/15/23 Office Visit Antonetta Delon BRAVO, APRN - CNP [facility]   07/14/23 Office Visit Antonetta Delon BRAVO, APRN - CNP [facility]   07/11/23 Office Visit Antonetta Delon BRAVO, APRN - CNP [facility]   06/29/23 Office Visit Antonetta Delon BRAVO, APRN - CNP [facility]   05/11/23 Office Visit Antonetta Delon BRAVO, APRN - CNP [facility]   04/06/23 Office Visit Antonetta Delon BRAVO, APRN - CNP [facility]   12/07/22 Office Visit Antonetta Delon BRAVO, APRN - CNP [facility]   08/02/22 Office Visit Antonetta Delon BRAVO, APRN - CNP [facility]   Showing recent visits within past 540 days with a meds authorizing provider and meeting all other requirements  Future Appointments  Date Type Provider Dept   01/16/24 Appointment Antonetta Delon BRAVO, APRN - CNP [facility]   Showing future appointments within next 150 days with a meds authorizing provider and meeting all other requirements

## 2023-09-06 ENCOUNTER — Encounter

## 2023-09-06 MED ORDER — OZEMPIC (1 MG/DOSE) 4 MG/3ML SC SOPN
4 | SUBCUTANEOUS | 3 refills | 28.00000 days | Status: DC
Start: 2023-09-06 — End: 2023-12-27

## 2023-09-06 NOTE — Telephone Encounter (Signed)
 Recent Visits  Date Type Provider Dept   08/15/23 Office Visit Antonetta Delon BRAVO, APRN - CNP [facility]   07/14/23 Office Visit Antonetta Delon BRAVO, APRN - CNP [facility]   07/11/23 Office Visit Antonetta Delon BRAVO, APRN - CNP [facility]   06/29/23 Office Visit Antonetta Delon BRAVO, APRN - CNP [facility]   05/11/23 Office Visit Antonetta Delon BRAVO, APRN - CNP [facility]   04/06/23 Office Visit Antonetta Delon BRAVO, APRN - CNP [facility]   12/07/22 Office Visit Antonetta Delon BRAVO, APRN - CNP [facility]   08/02/22 Office Visit Antonetta Delon BRAVO, APRN - CNP [facility]   Showing recent visits within past 540 days with a meds authorizing provider and meeting all other requirements  Future Appointments  Date Type Provider Dept   01/16/24 Appointment Antonetta Delon BRAVO, APRN - CNP [facility]   Showing future appointments within next 150 days with a meds authorizing provider and meeting all other requirements

## 2023-09-12 ENCOUNTER — Ambulatory Visit: Admit: 2023-09-12 | Discharge: 2023-09-12 | Payer: Medicare (Managed Care) | Attending: Urology | Primary: Family

## 2023-09-12 VITALS — Resp 18 | Ht 67.01 in | Wt 199.0 lb

## 2023-09-12 DIAGNOSIS — N401 Enlarged prostate with lower urinary tract symptoms: Principal | ICD-10-CM

## 2023-09-12 NOTE — Progress Notes (Signed)
 Micheal DELENA Bathe MD.     [facility] [place] [facility]  [facility]  [phone]  [address]  [place] [address]  Dept: [phone]  Dept Fax: [phone]  Loc: [phone]    [facility] Office Note -     Patient:  Micheal [name]  Date of Birth: 1955-03-30    The patient is a 68 y.o. male who presents today for evaluation of the following problems:   Chief Complaint   Patient presents with    Results     Here to review biopsy results        History of Present Illness:    Elevated psa  Isopsa discussed  pMRI pirads 3  Psa reviewed  Biopsy reviewed negative      ED  On nitrates  Not interested in trimix  Considering IPP    BPH  On flomax       Summary of Previous Records:  Patient is a 68 year old male who presents to urology for evaluation of elevated PSA + erectile dysfunction + BPH.    Has a history of elevated PSA, iso-PSA of 2.2    Completed on 05/24/2023.    He did have a PSA of 7.41 on 04/04/2023 and underwent a MRI which showed PI-RADS 3 with moderate prostatomegaly.  He was undecided about biopsy    History of erectile dysfunction, cannot tolerate p.o. medications secondary to nitrates.      Assessment     elevated PSA proceed with biopsy  due to PI-RADS 3 on MRI  ISO PSA of 2.2 less than the thresh hold?       BPH continue Flomax , works well only get up once at night.     ED defer treatment with p.o. medication secondary to heart history, does not want Trimix injections. Did get a sample however did not use due to not want to inject.     Penis pump?          Requested/reviewed records from Antonetta Delon BRAVO, APRN - CNP office and/or outside physician/EMR    (Patient's old records have been requested, reviewed and pertinent findings summarized in today's note.)    Procedures Today: N/A    Last several PSA's:  Lab Results   Component Value Date    PSA 7.41 (H) 04/04/2023    PSA 3.38 (H) 01/06/2023    PSA 2.74 (H) 11/07/2017       Last total testosterone:  No results found for:  TESTOSTERONE    Urinalysis today:  No results found for this visit on 09/12/23.    Last BUN and creatinine:  Lab Results   Component Value Date    BUN 21 06/26/2023     Lab Results   Component Value Date    CREATININE 1.0 06/26/2023       Imaging Reviewed during this Office Visit:   Micheal HILDEGARD BATHE, MD independently reviewed the images and verified the radiology reports from:    MRI PROSTATE W WO CONTRAST  Result Date: 05/11/2023  PROCEDURE: MRI PROSTATE W WO CONTRAST CLINICAL INFORMATION: Elevated prostate specific antigen (PSA) COMPARISON: None TECHNIQUE: Axial T2, coronal T2 and sagittal T2 imaging of the prostate gland. Large field of view axial T2 imaging of the prostate gland. Axial T1, axial T1 VIBE dynamic post gad and axial T1 VIBE dynamic subtracted post gad images through the prostate gland. Diffusion 50, 800, 1400 and ADC maps through the prostate gland. Axial T1 STAR  VIBE post gad through the pelvis. 3-D images reconstructed on a separate Dyna Cad workstation with MRI TRUS fusion of prostate mass lesions. CONTRAST: 20  mL of ProHance   intravenously. LABORATORY: 1. PSA on 04/04/2023 was 7.41 ng/ml 2. PSA on 01/06/2023 was 3.38 ng/ml 3. PSA on 11/07/2017 was 2.74 ng/ml FINDINGS: PROSTATE SIZE: 1. The prostate gland is moderately enlarged measuring 5.6 x 4.9 x 4.9 cm 2. The prostate volume is 69.92 ml. TRANSITIONAL ZONE: 1. Enlarged and heterogeneous appearance. 2. Multiple encapsulated and nonencapsulated nodules. CENTRAL ZONE: 1. Unremarkable. PERIPHERAL ZONE: 1. Unremarkable. PROSTATE LESIONS: 1. Lesion #: 1 Location: Right posterior transition zone (series 4, image 16) Size: 1.7 x 1.2 cm T2: Moderately T2 hypointense nodule ADC: Moderate T2 hypointense DWI: Moderately T2 hyperintense DCE: Positive Prostate margin: Intact Overall PI-RADS category: 3/5 SEMINAL VESICLES: Unremarkable. NEUROVASCULAR BUNDLES: Intact URINARY BLADDER/RECTUM/PELVIC DIAPHRAGM: 1. Circumferential bladder wall thickening as can be  seen with chronic urinary retention. 2. The rectum and pelvic diaphragm are unremarkable. PATHOLOGICALLY ENLARGED LYMPH NODES: 1. None. OSSEOUS STRUCTURES: 1. Degenerative changes of the SI joints. OTHER: 1. Small bilateral fat-containing inguinal annulus.     1. A 1.7 x 1.2 cm focal prostatic nodule is seen in the right posterior transition zone in the mid gland of the prostate (PI-RADS 3). 2. Overall PI-RADS assessment is PI-RADS 3. 3. Moderate prostatomegaly.    PAST MEDICAL, FAMILY AND SOCIAL HISTORY:  Past Medical History:   Diagnosis Date    Arthritis     general    CAD (coronary artery disease)     s/p stent placement-sees Dr. Pearley    Chronic back pain     Diabetes mellitus (HCC)     GERD (gastroesophageal reflux disease)     Hyperlipidemia     Hypertension      Past Surgical History:   Procedure Laterality Date    COLONOSCOPY  2015    [place], [place]     COLONOSCOPY Left 12/19/2017    COLONOSCOPY POLYPECTOMY HOT BIOPSY performed by Ozell ONEIDA Fujisawa, MD at [facility]    COLONOSCOPY N/A 01/10/2023    Colonoscopy, diagnostic with or without biopsy with or without polypectomy performed by Fujisawa Ozell ONEIDA, MD at [facility]    CORONARY ARTERY BYPASS GRAFT  2009    6 vessel-[place]     DIAGNOSTIC CARDIAC CATH LAB PROCEDURE      ORTHOPEDIC SURGERY Left     PTCA  07/04/2016    TONSILLECTOMY       Family History   Problem Relation Age of Onset    Cancer Mother     Heart Disease Brother     Stroke Brother     Diabetes Brother     Early Death Brother     High Blood Pressure Brother     High Cholesterol Brother     Mental Retardation Sister      Outpatient Medications Marked as Taking for the 09/12/23 encounter (Office Visit) with Fernand Bayley, MD   Medication Sig Dispense Refill    Semaglutide , 1 MG/DOSE, (OZEMPIC , 1 MG/DOSE,) 4 MG/3ML SOPN sc injection INJECT 1 MG SUBCUTANEOUSLY  ONCE A WEEK 3 mL 3    ondansetron  (ZOFRAN -ODT) 4 MG disintegrating tablet DISSOLVE 1 TABLET IN MOUTH THREE TIMES  DAILY AS NEEDED FOR NAUSEA AND/OR FOR VOMITING 21 tablet 0    nitroGLYCERIN  (NITROSTAT ) 0.4 MG SL tablet Place 1 tablet under the tongue every 5 minutes as needed for Chest pain 25 tablet 2    fenofibrate  (  TRICOR ) 145 MG tablet Take 1 tablet by mouth daily 90 tablet 3    oxyCODONE -acetaminophen  (PERCOCET ) 10-325 MG per tablet Take 1 tablet by mouth every 8 hours as needed for Pain for up to 30 days. 90 tablet 0    diazePAM  (VALIUM ) 5 MG tablet Take 1 tablet by mouth every 8 hours as needed for Anxiety for up to 90 days. Max Daily Amount: 15 mg 90 tablet 2    albuterol  sulfate HFA (PROVENTIL ;VENTOLIN ;PROAIR ) 108 (90 Base) MCG/ACT inhaler Inhale 2 puffs into the lungs every 6 hours as needed for Wheezing 18 g 3    albuterol  (PROVENTIL ) (2.5 MG/3ML) 0.083% nebulizer solution Take 3 mLs by nebulization 4 times daily as needed for Wheezing 120 each 3    PARoxetine  (PAXIL ) 40 MG tablet Take 1 tablet by mouth daily 90 tablet 3    ticagrelor  (BRILINTA ) 90 MG TABS tablet Take 1 tablet by mouth twice daily 180 tablet 3    fluticasone  (FLONASE ) 50 MCG/ACT nasal spray 2 sprays by Each Nostril route daily 16 g 1    esomeprazole  (NEXIUM ) 40 MG delayed release capsule TAKE 1 CAPSULE BY MOUTH ONCE DAILY IN THE MORNING BEFORE BREAKFAST 90 capsule 3    tamsulosin  (FLOMAX ) 0.4 MG capsule Take 1 capsule by mouth 2 times daily 180 capsule 3    metoprolol  succinate (TOPROL  XL) 25 MG extended release tablet Take 1 tablet by mouth daily 90 tablet 3    lisinopril  (PRINIVIL ;ZESTRIL ) 40 MG tablet Take 1 tablet by mouth daily 90 tablet 3    cetirizine  (ZYRTEC ) 10 MG tablet Take 1 tablet by mouth daily 90 tablet 3    pregabalin  (LYRICA ) 150 MG capsule Take 1 capsule by mouth in the morning, at noon, and at bedtime for 180 days. 270 capsule 1    isosorbide  mononitrate (IMDUR ) 30 MG extended release tablet Take 1 tablet by mouth daily 90 tablet 3    metFORMIN  (GLUCOPHAGE ) 1000 MG tablet Take 1 tablet by mouth 2 times daily (with meals) 180 tablet  3    atorvastatin  (LIPITOR ) 80 MG tablet Take 1 tablet by mouth daily 90 tablet 3    SYMBICORT  80-4.5 MCG/ACT AERO INHALE 2 PUFFS INTO THE LUNGS 2 TIMES DAILY 1 each 11    hydrOXYzine  HCl (ATARAX ) 25 MG tablet TAKE 1 TABLET BY MOUTH EVERY 8 HOURS AS NEEDED FOR PAIN AND  SPASMING.  TAKE  WITH  PAIN  MEDICATION 90 tablet 3    olopatadine  (PATANOL) 0.1 % ophthalmic solution Place 1 drop into both eyes 2 times daily 3 each 3    Lancets (ONETOUCH DELICA PLUS LANCET33G) MISC USE 1  TO CHECK GLUCOSE ONCE DAILY 100 each 0    montelukast  (SINGULAIR ) 10 MG tablet Take 1 tablet by mouth nightly 90 tablet 3    glimepiride  (AMARYL ) 2 MG tablet Take 1 tablet by mouth twice daily 180 tablet 3    hydrocortisone  2.5 % cream Apply to external ear BID PRN 3 each 3    blood glucose monitor kit and supplies Dispense sufficient amount for indicated testing frequency plus additional to accommodate PRN testing needs. Dispense all needed supplies to include: monitor, strips, lancing device, lancets, control solutions, alcohol swabs. 1 kit 0    blood glucose monitor strips Test 1 times a day & as needed for symptoms of irregular blood glucose. Dispense sufficient amount for indicated testing frequency plus additional to accommodate PRN testing needs. 300 strip 0    pioglitazone  (ACTOS ) 15 MG  tablet Take 1 tablet by mouth once daily 90 tablet 3    Handicap Placard MISC by Does not apply route Expires 06/30/25 1 each 0    NARCAN  4 MG/0.1ML LIQD nasal spray ADMINISTER A SINGLE SPRAY INTRANASALLY INTO ONE NOSTRIL. CALL 911. MAY REPEAT X 1.         Corticosteroids, Gabapentin , and Prednisone   Social History     Tobacco Use   Smoking Status Former    Current packs/day: 0.00    Average packs/day: 1 pack/day for 15.0 years (15.0 ttl pk-yrs)    Types: Cigarettes    Start date: 05/24/1993    Quit date: 05/24/2008    Years since quitting: 15.3   Smokeless Tobacco Never      (If patient a smoker, smoking cessation counseling offered)   Social History      Substance and Sexual Activity   Alcohol Use Yes    Alcohol/week: 1.0 standard drink of alcohol    Types: 1 Glasses of wine per week    Comment: once monthly stopped a over 2 years       REVIEW OF SYSTEMS:  Constitutional: negative  Eyes: negative  Respiratory: negative  Cardiovascular: negative  Gastrointestinal: negative  Genitourinary: see HPI  Musculoskeletal: negative  Skin: negative   Neurological: negative  Hematological/Lymphatic: negative  Psychological: negative        Physical Exam:    This a 69 y.o. male  Vitals:    09/12/23 1536   Resp: 18     Body mass index is 31.16 kg/m.  Constitutional: Patient in no acute distress;         Assessment and Plan        1. Benign localized prostatic hyperplasia with lower urinary tract symptoms (LUTS)    2. Elevated PSA    3. Erectile dysfunction, unspecified erectile dysfunction type                 Plan:   Assessment & Plan     Elevated psa- negative biopsy. Psa six months app  BPH- 69 g. stable on flomax . Continue.   ED- cannot talk oral meds. Not interested in trimix. May consider IPP in future.       Prescriptions Ordered:  No orders of the defined types were placed in this encounter.     Orders Placed:  No orders of the defined types were placed in this encounter.           Lenah Messenger ALI FERNAND, MD

## 2023-09-19 ENCOUNTER — Encounter

## 2023-09-19 MED ORDER — FLUTICASONE PROPIONATE 50 MCG/ACT NA SUSP
50 | Freq: Every day | NASAL | 1 refills | 60.00000 days | Status: DC
Start: 2023-09-19 — End: 2023-12-15

## 2023-09-19 MED ORDER — OXYCODONE-ACETAMINOPHEN 10-325 MG PO TABS
10-325 | ORAL_TABLET | Freq: Three times a day (TID) | ORAL | 0 refills | 7.00000 days | Status: DC | PRN
Start: 2023-09-19 — End: 2023-10-17

## 2023-09-19 NOTE — Telephone Encounter (Signed)
 Rx sent.

## 2023-09-19 NOTE — Telephone Encounter (Signed)
 Patient contacted the office requesting refills be sent to the pharmacy on file.     Recent Visits  Date Type Provider Dept   08/15/23 Office Visit Antonetta Delon BRAVO, APRN - CNP [facility]   07/14/23 Office Visit Antonetta Delon BRAVO, APRN - CNP [facility]   07/11/23 Office Visit Antonetta Delon BRAVO, APRN - CNP [facility]   06/29/23 Office Visit Antonetta Delon BRAVO, APRN - CNP [facility]   05/11/23 Office Visit Antonetta Delon BRAVO, APRN - CNP [facility]   04/06/23 Office Visit Antonetta Delon BRAVO, APRN - CNP [facility]   12/07/22 Office Visit Antonetta Delon BRAVO, APRN - CNP [facility]   08/02/22 Office Visit Antonetta Delon BRAVO, APRN - CNP [facility]   Showing recent visits within past 540 days with a meds authorizing provider and meeting all other requirements  Future Appointments  Date Type Provider Dept   01/16/24 Appointment Antonetta Delon BRAVO, APRN - CNP [facility]   Showing future appointments within next 150 days with a meds authorizing provider and meeting all other requirements

## 2023-10-05 ENCOUNTER — Encounter

## 2023-10-05 MED ORDER — PREGABALIN 150 MG PO CAPS
150 | ORAL_CAPSULE | Freq: Three times a day (TID) | ORAL | 1 refills | Status: AC
Start: 2023-10-05 — End: 2024-04-02

## 2023-10-05 NOTE — Telephone Encounter (Signed)
 Patient contacted the office and requested a refill of Pregabalin  be sent to the pharmacy on file.     Recent Visits  Date Type Provider Dept   08/15/23 Office Visit Antonetta Delon BRAVO, APRN - CNP [facility]   07/14/23 Office Visit Antonetta Delon BRAVO, APRN - CNP [facility]   07/11/23 Office Visit Antonetta Delon BRAVO, APRN - CNP [facility]   06/29/23 Office Visit Antonetta Delon BRAVO, APRN - CNP [facility]   05/11/23 Office Visit Antonetta Delon BRAVO, APRN - CNP [facility]   04/06/23 Office Visit Antonetta Delon BRAVO, APRN - CNP [facility]   12/07/22 Office Visit Antonetta Delon BRAVO, APRN - CNP [facility]   08/02/22 Office Visit Antonetta Delon BRAVO, APRN - CNP [facility]   Showing recent visits within past 540 days with a meds authorizing provider and meeting all other requirements  Future Appointments  Date Type Provider Dept   01/16/24 Appointment Antonetta Delon BRAVO, APRN - CNP [facility]   Showing future appointments within next 150 days with a meds authorizing provider and meeting all other requirements

## 2023-10-17 ENCOUNTER — Encounter

## 2023-10-17 MED ORDER — OXYCODONE-ACETAMINOPHEN 10-325 MG PO TABS
10-325 | ORAL_TABLET | Freq: Three times a day (TID) | ORAL | 0 refills | 6.00000 days | Status: DC | PRN
Start: 2023-10-17 — End: 2023-11-14

## 2023-10-17 NOTE — Telephone Encounter (Signed)
 Recent Visits  Date Type Provider Dept   08/15/23 Office Visit Antonetta Delon BRAVO, APRN - CNP [facility]   07/14/23 Office Visit Antonetta Delon BRAVO, APRN - CNP [facility]   07/11/23 Office Visit Antonetta Delon BRAVO, APRN - CNP [facility]   06/29/23 Office Visit Antonetta Delon BRAVO, APRN - CNP [facility]   05/11/23 Office Visit Antonetta Delon BRAVO, APRN - CNP [facility]   04/06/23 Office Visit Antonetta Delon BRAVO, APRN - CNP [facility]   12/07/22 Office Visit Antonetta Delon BRAVO, APRN - CNP [facility]   08/02/22 Office Visit Antonetta Delon BRAVO, APRN - CNP [facility]   Showing recent visits within past 540 days with a meds authorizing provider and meeting all other requirements  Future Appointments  Date Type Provider Dept   01/16/24 Appointment Antonetta Delon BRAVO, APRN - CNP [facility]   Showing future appointments within next 150 days with a meds authorizing provider and meeting all other requirements

## 2023-10-17 NOTE — Telephone Encounter (Signed)
 I reviewed an OAR[place] report on patient today.  There were no abnormal findings on the report.  Rx sent  Electronically signed by Delon FORBES Pesa, APRN - CNP on 10/17/23 at 1:49 PM EDT

## 2023-11-08 ENCOUNTER — Encounter

## 2023-11-08 MED ORDER — ALBUTEROL SULFATE (2.5 MG/3ML) 0.083% IN NEBU
RESPIRATORY_TRACT | 3 refills | Status: AC
Start: 2023-11-08 — End: ?

## 2023-11-08 NOTE — Telephone Encounter (Signed)
"  Recent Visits  Date Type Provider Dept   08/15/23 Office Visit Antonetta Delon BRAVO, APRN - CNP [facility]   07/14/23 Office Visit Antonetta Delon BRAVO, APRN - CNP [facility]   07/11/23 Office Visit Antonetta Delon BRAVO, APRN - CNP [facility]   06/29/23 Office Visit Antonetta Delon BRAVO, APRN - CNP [facility]   05/11/23 Office Visit Antonetta Delon BRAVO, APRN - CNP [facility]   04/06/23 Office Visit Antonetta Delon BRAVO, APRN - CNP [facility]   12/07/22 Office Visit Antonetta Delon BRAVO, APRN - CNP [facility]   08/02/22 Office Visit Antonetta Delon BRAVO, APRN - CNP [facility]   Showing recent visits within past 540 days with a meds authorizing provider and meeting all other requirements  Future Appointments  Date Type Provider Dept   01/16/24 Appointment Antonetta Delon BRAVO, APRN - CNP [facility]   Showing future appointments within next 150 days with a meds authorizing provider and meeting all other requirements     "

## 2023-11-11 ENCOUNTER — Encounter

## 2023-11-14 ENCOUNTER — Encounter

## 2023-11-14 MED ORDER — OXYCODONE-ACETAMINOPHEN 10-325 MG PO TABS
10-325 | ORAL_TABLET | Freq: Three times a day (TID) | ORAL | 0 refills | 6.00000 days | Status: DC | PRN
Start: 2023-11-14 — End: 2023-12-14

## 2023-11-14 NOTE — Telephone Encounter (Signed)
"  I reviewed an OARRS report on patient today.  There were no abnormal findings on the report.  Rx sent  Electronically signed by Delon FORBES Pesa, APRN - CNP on 11/14/23 at 5:01 PM EDT    "

## 2023-11-14 NOTE — Telephone Encounter (Signed)
"  Patient contacted the office and requested a refill of Percocet  be sent to the pharmacy on file.     Please review.     Recent Visits  Date Type Provider Dept   08/15/23 Office Visit Antonetta Delon BRAVO, APRN - CNP [facility]   07/14/23 Office Visit Antonetta Delon BRAVO, APRN - CNP [facility]   07/11/23 Office Visit Antonetta Delon BRAVO, APRN - CNP [facility]   06/29/23 Office Visit Antonetta Delon BRAVO, APRN - CNP [facility]   05/11/23 Office Visit Antonetta Delon BRAVO, APRN - CNP [facility]   04/06/23 Office Visit Antonetta Delon BRAVO, APRN - CNP [facility]   12/07/22 Office Visit Antonetta Delon BRAVO, APRN - CNP [facility]   08/02/22 Office Visit Antonetta Delon BRAVO, APRN - CNP [facility]   Showing recent visits within past 540 days with a meds authorizing provider and meeting all other requirements  Future Appointments  Date Type Provider Dept   01/16/24 Appointment Antonetta Delon BRAVO, APRN - CNP [facility]   Showing future appointments within next 150 days with a meds authorizing provider and meeting all other requirements      "

## 2023-11-26 NOTE — Progress Notes (Signed)
 Pt left.

## 2023-12-14 ENCOUNTER — Encounter

## 2023-12-14 MED ORDER — OXYCODONE-ACETAMINOPHEN 10-325 MG PO TABS
10-325 | ORAL_TABLET | Freq: Three times a day (TID) | ORAL | 0 refills | 6.00000 days | Status: DC | PRN
Start: 2023-12-14 — End: 2024-01-10

## 2023-12-14 NOTE — Telephone Encounter (Signed)
"  Recent Visits  Date Type Provider Dept   08/15/23 Office Visit Antonetta Delon BRAVO, APRN - CNP [facility]   07/14/23 Office Visit Antonetta Delon BRAVO, APRN - CNP [facility]   07/11/23 Office Visit Antonetta Delon BRAVO, APRN - CNP [facility]   06/29/23 Office Visit Antonetta Delon BRAVO, APRN - CNP [facility]   05/11/23 Office Visit Antonetta Delon BRAVO, APRN - CNP [facility]   04/06/23 Office Visit Antonetta Delon BRAVO, APRN - CNP [facility]   12/07/22 Office Visit Antonetta Delon BRAVO, APRN - CNP [facility]   08/02/22 Office Visit Antonetta Delon BRAVO, APRN - CNP [facility]   Showing recent visits within past 540 days with a meds authorizing provider and meeting all other requirements  Future Appointments  Date Type Provider Dept   01/16/24 Appointment Antonetta Delon BRAVO, APRN - CNP [facility]   Showing future appointments within next 150 days with a meds authorizing provider and meeting all other requirements     "

## 2023-12-14 NOTE — Telephone Encounter (Signed)
"  Pt requests refill be sent to [facility]     Recent Visits  Date Type Provider Dept   08/15/23 Office Visit Antonetta Delon BRAVO, APRN - CNP [facility]   07/14/23 Office Visit Antonetta Delon BRAVO, APRN - CNP [facility]   07/11/23 Office Visit Antonetta Delon BRAVO, APRN - CNP [facility]   06/29/23 Office Visit Antonetta Delon BRAVO, APRN - CNP [facility]   05/11/23 Office Visit Antonetta Delon BRAVO, APRN - CNP [facility]   04/06/23 Office Visit Antonetta Delon BRAVO, APRN - CNP [facility]   12/07/22 Office Visit Antonetta Delon BRAVO, APRN - CNP [facility]   08/02/22 Office Visit Antonetta Delon BRAVO, APRN - CNP [facility]   Showing recent visits within past 540 days with a meds authorizing provider and meeting all other requirements  Future Appointments  Date Type Provider Dept   01/16/24 Appointment Antonetta Delon BRAVO, APRN - CNP [facility]   Showing future appointments within next 150 days with a meds authorizing provider and meeting all other requirements     "

## 2023-12-15 ENCOUNTER — Encounter

## 2023-12-15 MED ORDER — FLUTICASONE PROPIONATE 50 MCG/ACT NA SUSP
50 | NASAL | 4 refills | 60.00000 days | Status: AC
Start: 2023-12-15 — End: ?

## 2023-12-18 MED ORDER — DIAZEPAM 5 MG PO TABS
5 | ORAL_TABLET | Freq: Three times a day (TID) | ORAL | 2 refills | 30.00000 days | Status: AC | PRN
Start: 2023-12-18 — End: 2024-03-17

## 2023-12-18 NOTE — Telephone Encounter (Signed)
"  I reviewed an OARRS report on patient today.  There were no abnormal findings on the report.  Rx sent  Electronically signed by Delon FORBES Pesa, APRN - CNP on 12/18/23 at 10:09 AM EST    "

## 2023-12-18 NOTE — Telephone Encounter (Signed)
"  Recent Visits  Date Type Provider Dept   08/15/23 Office Visit Antonetta Delon BRAVO, APRN - CNP [facility]   07/14/23 Office Visit Antonetta Delon BRAVO, APRN - CNP [facility]   07/11/23 Office Visit Antonetta Delon BRAVO, APRN - CNP [facility]   06/29/23 Office Visit Antonetta Delon BRAVO, APRN - CNP [facility]   05/11/23 Office Visit Antonetta Delon BRAVO, APRN - CNP [facility]   04/06/23 Office Visit Antonetta Delon BRAVO, APRN - CNP [facility]   12/07/22 Office Visit Antonetta Delon BRAVO, APRN - CNP [facility]   08/02/22 Office Visit Antonetta Delon BRAVO, APRN - CNP [facility]   Showing recent visits within past 540 days with a meds authorizing provider and meeting all other requirements  Future Appointments  Date Type Provider Dept   01/16/24 Appointment Antonetta Delon BRAVO, APRN - CNP [facility]   Showing future appointments within next 150 days with a meds authorizing provider and meeting all other requirements     "

## 2023-12-25 ENCOUNTER — Ambulatory Visit
Admit: 2023-12-25 | Discharge: 2023-12-25 | Payer: Medicare (Managed Care) | Attending: Cardiovascular Disease | Primary: Family

## 2023-12-25 VITALS — BP 132/62 | HR 82 | Ht 67.0 in | Wt 195.6 lb

## 2023-12-25 DIAGNOSIS — I25810 Atherosclerosis of coronary artery bypass graft(s) without angina pectoris: Principal | ICD-10-CM

## 2023-12-25 NOTE — Progress Notes (Signed)
 "[facility] [place] [facility]  [facility]  [address]  [address]  [place] [address]  Dept: [phone]  Dept Fax: [phone]  Loc: [phone]    Visit Date: 12/25/2023    Micheal [name] is a 68 y.o. male who presents todayfor:  Chief Complaint   Patient presents with    1 Year Follow Up    Coronary Artery Disease    Hypertension    Dizziness    Hyperlipidemia     Know CAD s/p CABG  Some baseline short of breath  No chest pain or discomfort  Some dizziness after standing from sitting position  No syncope  Blood pressure stable  On Brilinta  and ASA  No issue with other meds    HPI:  HPI  Past Medical History:   Diagnosis Date    Arthritis     general    CAD (coronary artery disease)     s/p stent placement-sees Dr. Pearley    Chronic back pain     Diabetes mellitus (HCC)     GERD (gastroesophageal reflux disease)     Hyperlipidemia     Hypertension       Past Surgical History:   Procedure Laterality Date    COLONOSCOPY  2015    [place], [place]     COLONOSCOPY Left 12/19/2017    COLONOSCOPY POLYPECTOMY HOT BIOPSY performed by Ozell ONEIDA Fujisawa, MD at [facility]    COLONOSCOPY N/A 01/10/2023    Colonoscopy, diagnostic with or without biopsy with or without polypectomy performed by Fujisawa Ozell ONEIDA, MD at [facility]    CORONARY ARTERY BYPASS GRAFT  2009    6 vessel-[place]     DIAGNOSTIC CARDIAC CATH LAB PROCEDURE      ORTHOPEDIC SURGERY Left     PTCA  07/04/2016    TONSILLECTOMY       Family History   Problem Relation Age of Onset    Cancer Mother     Heart Disease Brother     Stroke Brother     Diabetes Brother     Early Death Brother     High Blood Pressure Brother     High Cholesterol Brother     Mental Retardation Sister      Social History     Tobacco Use    Smoking status: Former     Current packs/day: 0.00     Average packs/day: 1 pack/day for 15.0 years (15.0 ttl pk-yrs)     Types: Cigarettes     Start date: 05/24/1993     Quit date: 05/24/2008     Years  since quitting: 15.5    Smokeless tobacco: Never   Substance Use Topics    Alcohol use: Yes     Alcohol/week: 1.0 standard drink of alcohol     Types: 1 Glasses of wine per week     Comment: once monthly stopped a over 2 years      Current Outpatient Medications   Medication Sig Dispense Refill    diazePAM  (VALIUM ) 5 MG tablet Take 1 tablet by mouth every 8 hours as needed for Anxiety for up to 90 days. Max Daily Amount: 15 mg 90 tablet 2    fluticasone  (FLONASE ) 50 MCG/ACT nasal spray Use 2 spray(s) in each nostril once daily 16 g 4    oxyCODONE -acetaminophen  (PERCOCET ) 10-325 MG per tablet Take 1 tablet by mouth every 8 hours as needed for Pain for up to 30  days. 90 tablet 0    albuterol  (PROVENTIL ) (2.5 MG/3ML) 0.083% nebulizer solution USE 1 VIAL IN NEBULIZER 4 TIMES DAILY AS NEEDED FOR WHEEZING 360 mL 3    pregabalin  (LYRICA ) 150 MG capsule Take 1 capsule by mouth in the morning, at noon, and at bedtime for 180 days. Take 1 capsule by mouth in the morning, at noon, and at bedtime for 180 days. Max Daily Amount: 450 mg 270 capsule 1    Aspirin  81 MG CAPS Take by mouth      Semaglutide , 1 MG/DOSE, (OZEMPIC , 1 MG/DOSE,) 4 MG/3ML SOPN sc injection INJECT 1 MG SUBCUTANEOUSLY  ONCE A WEEK 3 mL 3    ondansetron  (ZOFRAN -ODT) 4 MG disintegrating tablet DISSOLVE 1 TABLET IN MOUTH THREE TIMES DAILY AS NEEDED FOR NAUSEA AND/OR FOR VOMITING 21 tablet 0    nitroGLYCERIN  (NITROSTAT ) 0.4 MG SL tablet Place 1 tablet under the tongue every 5 minutes as needed for Chest pain 25 tablet 2    fenofibrate  (TRICOR ) 145 MG tablet Take 1 tablet by mouth daily 90 tablet 3    albuterol  sulfate HFA (PROVENTIL ;VENTOLIN ;PROAIR ) 108 (90 Base) MCG/ACT inhaler Inhale 2 puffs into the lungs every 6 hours as needed for Wheezing 18 g 3    PARoxetine  (PAXIL ) 40 MG tablet Take 1 tablet by mouth daily 90 tablet 3    ticagrelor  (BRILINTA ) 90 MG TABS tablet Take 1 tablet by mouth twice daily 180 tablet 3    esomeprazole  (NEXIUM ) 40 MG delayed release  capsule TAKE 1 CAPSULE BY MOUTH ONCE DAILY IN THE MORNING BEFORE BREAKFAST 90 capsule 3    tamsulosin  (FLOMAX ) 0.4 MG capsule Take 1 capsule by mouth 2 times daily 180 capsule 3    metoprolol  succinate (TOPROL  XL) 25 MG extended release tablet Take 1 tablet by mouth daily 90 tablet 3    lisinopril  (PRINIVIL ;ZESTRIL ) 40 MG tablet Take 1 tablet by mouth daily 90 tablet 3    cetirizine  (ZYRTEC ) 10 MG tablet Take 1 tablet by mouth daily 90 tablet 3    isosorbide  mononitrate (IMDUR ) 30 MG extended release tablet Take 1 tablet by mouth daily 90 tablet 3    metFORMIN  (GLUCOPHAGE ) 1000 MG tablet Take 1 tablet by mouth 2 times daily (with meals) 180 tablet 3    atorvastatin  (LIPITOR ) 80 MG tablet Take 1 tablet by mouth daily 90 tablet 3    SYMBICORT  80-4.5 MCG/ACT AERO INHALE 2 PUFFS INTO THE LUNGS 2 TIMES DAILY 1 each 11    hydrOXYzine  HCl (ATARAX ) 25 MG tablet TAKE 1 TABLET BY MOUTH EVERY 8 HOURS AS NEEDED FOR PAIN AND  SPASMING.  TAKE  WITH  PAIN  MEDICATION 90 tablet 3    olopatadine  (PATANOL) 0.1 % ophthalmic solution Place 1 drop into both eyes 2 times daily 3 each 3    Lancets (ONETOUCH DELICA PLUS LANCET33G) MISC USE 1  TO CHECK GLUCOSE ONCE DAILY 100 each 0    montelukast  (SINGULAIR ) 10 MG tablet Take 1 tablet by mouth nightly 90 tablet 3    glimepiride  (AMARYL ) 2 MG tablet Take 1 tablet by mouth twice daily 180 tablet 3    hydrocortisone  2.5 % cream Apply to external ear BID PRN 3 each 3    blood glucose monitor kit and supplies Dispense sufficient amount for indicated testing frequency plus additional to accommodate PRN testing needs. Dispense all needed supplies to include: monitor, strips, lancing device, lancets, control solutions, alcohol swabs. 1 kit 0    blood glucose monitor strips  Test 1 times a day & as needed for symptoms of irregular blood glucose. Dispense sufficient amount for indicated testing frequency plus additional to accommodate PRN testing needs. 300 strip 0    pioglitazone  (ACTOS ) 15 MG tablet  Take 1 tablet by mouth once daily 90 tablet 3    Handicap Placard MISC by Does not apply route Expires 06/30/25 1 each 0    NARCAN  4 MG/0.1ML LIQD nasal spray ADMINISTER A SINGLE SPRAY INTRANASALLY INTO ONE NOSTRIL. CALL 911. MAY REPEAT X 1. (Patient taking differently: as needed)       No current facility-administered medications for this visit.     Allergies   Allergen Reactions    Corticosteroids Other (See Comments) and Shortness Of Breath     Reaction to steroid medication in 04/2023. Pt does not recall name of steroid. Resulted in tongue swelling and difficulty breathing.    Gabapentin        I don't remember     Prednisone  Swelling     Swelling of throat and tongue  Does tolerate steroid shots fine     Health Maintenance   Topic Date Due    Annual Wellness Visit (Medicare Advantage)  01/25/2023    Diabetic foot exam  12/07/2023    Diabetic Alb to Cr ratio (uACR) test  01/06/2024    Lipids  01/06/2024    Diabetic retinal exam  01/10/2024 (Originally 06/11/1973)    Respiratory Syncytial Virus (RSV) Pregnant or age 9 yrs+ (1 - Risk 60-74 years 1-dose series) 04/05/2024 (Originally 06/12/2014)    Prostate Specific Antigen (PSA) Screening or Monitoring  04/03/2024    Depression Screen  04/05/2024    GFR test (Diabetes, CKD 3-4, OR last GFR 15-59)  06/25/2024    A1C test (Diabetic or Prediabetic)  07/10/2024    Colorectal Cancer Screen  01/10/2028    DTaP/Tdap/Td vaccine (2 - Td or Tdap) 08/01/2032    Flu vaccine  Completed    Shingles vaccine  Completed    Pneumococcal 50+ years Vaccine  Completed    COVID-19 Vaccine  Completed    AAA screen  Completed    Hepatitis C screen  Completed    Hepatitis A vaccine  Aged Out    Hepatitis B vaccine  Aged Out    Hib vaccine  Aged Out    Polio vaccine  Aged Out    Meningococcal (ACWY) vaccine  Aged Out    Meningococcal B vaccine  Aged Out    Pneumococcal 0-49 years Vaccine  Discontinued    HIV screen  Discontinued       Subjective:  General:   No fever, no chills, No fatigue or  weight loss  Pulmonary:    dyspnea, no wheezing  Cardiac:    Denies recent chest pain,   GI:     No nausea or vomiting, no abdominal pain  Neuro:    Dizziness, No light headedness,   Musculoskeletal:  No recent active issues  Extremities:   No edema, no obvious claudication       Objective:  General:   Well developed, well nourished  Lungs:   Clear to auscultation  Heart:    Normal S1 S2, Slight murmur. no rubs, no gallops  Abdomen:   Soft, non tender, no organomegalies, positive bowel sounds  Extremities:   No edema, no cyanosis, good peripheral pulses  Neurological:   Awake, alert, oriented. No obvious focal deficits  Musculoskelatal:  No obvious deformities   BP 132/62   Pulse  82   Ht 1.702 m (5' 7)   Wt 88.7 kg (195 lb 9.6 oz)   BMI 30.64 kg/m     Assessment:  Assessment & Plan    Diagnosis Orders   1. Coronary artery disease involving coronary bypass graft of native heart without angina pectoris  Hepatic Function Panel    Lipid Panel      2. Primary hypertension  Hepatic Function Panel    Lipid Panel      3. Familial hypercholesterolemia, unspecified type  Hepatic Function Panel    Lipid Panel          Plan:  No follow-ups on file.  As above  Cont risk modification and management   Orders Placed:  Orders Placed This Encounter   Procedures    Hepatic Function Panel     Standing Status:   Future     Expected Date:   01/24/2024     Expiration Date:   12/24/2024    Lipid Panel     Standing Status:   Future     Expected Date:   01/24/2024     Expiration Date:   12/24/2024     Is Patient Fasting?/# of Hours:   12 hours       Prescribed:  No orders of the defined types were placed in this encounter.         Discussed use, benefit, and side effects of prescribed medications. All patient questions answered. Pt voicedunderstanding. Instructed to continue current medications, diet and exercise. Continue risk factor modification and medical management. Patient agreed with treatment plan. Follow up as  directed.    Electronically signedby Xitlally Mooneyham A Ramonia Mcclaran, MD on 12/25/2023 at 12:39 PM   Patient is seen and examined independently   Was discussed with the resident on service and the treating staff   Above note is completed by the resident and reviewed by myself   Known CABG and stents  No chest pain   No changes in breathing  No use of nitro   Last cholesterol was high   Not best on his diet  BP is stable  No dizziness  No syncope  On statins for hyperlipidemia  No issues with the meds  Will check lipid panel  Consider adding Repatha if the numbers are not on target  Obesity: extensive discussion was made with the patient regarding diet and weight control including specific food items to avoid and cut down  Continue risk factor modification and medical management  Thank you for allowing me to participate in the care of your patient. Please don't hesitate to contact me regarding any further issues related to the patient care    Layken Beg A Eryc Bodey, MD    "

## 2023-12-27 ENCOUNTER — Encounter

## 2023-12-27 MED ORDER — OZEMPIC (1 MG/DOSE) 4 MG/3ML SC SOPN
4 | SUBCUTANEOUS | 1 refills | 56.00000 days | Status: DC
Start: 2023-12-27 — End: 2024-02-19

## 2023-12-27 NOTE — Telephone Encounter (Signed)
"  Recent Visits  Date Type Provider Dept   08/15/23 Office Visit Antonetta Delon BRAVO, APRN - CNP [facility]   07/14/23 Office Visit Antonetta Delon BRAVO, APRN - CNP [facility]   07/11/23 Office Visit Antonetta Delon BRAVO, APRN - CNP [facility]   06/29/23 Office Visit Antonetta Delon BRAVO, APRN - CNP [facility]   05/11/23 Office Visit Antonetta Delon BRAVO, APRN - CNP [facility]   04/06/23 Office Visit Antonetta Delon BRAVO, APRN - CNP [facility]   12/07/22 Office Visit Antonetta Delon BRAVO, APRN - CNP [facility]   08/02/22 Office Visit Antonetta Delon BRAVO, APRN - CNP [facility]   Showing recent visits within past 540 days with a meds authorizing provider and meeting all other requirements  Future Appointments  Date Type Provider Dept   01/16/24 Appointment Antonetta Delon BRAVO, APRN - CNP [facility]   Showing future appointments within next 150 days with a meds authorizing provider and meeting all other requirements     "

## 2024-01-07 ENCOUNTER — Encounter

## 2024-01-08 MED ORDER — METFORMIN HCL 1000 MG PO TABS
1000 | ORAL_TABLET | Freq: Two times a day (BID) | ORAL | 3 refills | Status: AC
Start: 2024-01-08 — End: ?

## 2024-01-08 MED ORDER — ATORVASTATIN CALCIUM 80 MG PO TABS
80 | ORAL_TABLET | Freq: Every day | ORAL | 3 refills | Status: AC
Start: 2024-01-08 — End: ?

## 2024-01-08 MED ORDER — ISOSORBIDE MONONITRATE ER 30 MG PO TB24
30 | ORAL_TABLET | Freq: Every day | ORAL | 3 refills | Status: AC
Start: 2024-01-08 — End: ?

## 2024-01-08 NOTE — Telephone Encounter (Signed)
"  Recent Visits  Date Type Provider Dept   08/15/23 Office Visit Antonetta Delon BRAVO, APRN - CNP [facility]   07/14/23 Office Visit Antonetta Delon BRAVO, APRN - CNP [facility]   07/11/23 Office Visit Antonetta Delon BRAVO, APRN - CNP [facility]   06/29/23 Office Visit Antonetta Delon BRAVO, APRN - CNP [facility]   05/11/23 Office Visit Antonetta Delon BRAVO, APRN - CNP [facility]   04/06/23 Office Visit Antonetta Delon BRAVO, APRN - CNP [facility]   12/07/22 Office Visit Antonetta Delon BRAVO, APRN - CNP [facility]   08/02/22 Office Visit Antonetta Delon BRAVO, APRN - CNP [facility]   Showing recent visits within past 540 days with a meds authorizing provider and meeting all other requirements  Future Appointments  Date Type Provider Dept   01/16/24 Appointment Antonetta Delon BRAVO, APRN - CNP [facility]   Showing future appointments within next 150 days with a meds authorizing provider and meeting all other requirements     "

## 2024-01-10 ENCOUNTER — Encounter

## 2024-01-10 MED ORDER — OXYCODONE-ACETAMINOPHEN 10-325 MG PO TABS
10-325 | ORAL_TABLET | Freq: Three times a day (TID) | ORAL | 0 refills | 7.00000 days | Status: DC | PRN
Start: 2024-01-10 — End: 2024-02-08

## 2024-01-10 NOTE — Telephone Encounter (Signed)
"  I reviewed an OARRS report on patient today.  There were no abnormal findings on the report.  Rx sent  Electronically signed by Delon FORBES Pesa, APRN - CNP on 01/10/24 at 4:19 PM EST    "

## 2024-01-10 NOTE — Telephone Encounter (Signed)
"  Patient called into office requesting medication refill. Pharmacy verified.  [facility]      Future Appointments   Date Time Provider Department Center   01/16/2024  1:00 PM Antonetta Delon BRAVO, APRN - CNP [facility] DEP   03/19/2024  2:30 PM Ridenour, Leontine PARAS, APRN - CNP [facility] [place] [facility] [place]   01/06/2025 11:15 AM Aurea Tawanna LABOR, MD [facility] [place]        "

## 2024-01-16 ENCOUNTER — Ambulatory Visit: Admit: 2024-01-16 | Discharge: 2024-01-16 | Payer: Medicare (Managed Care) | Attending: Family | Primary: Family

## 2024-01-16 VITALS — BP 112/70 | HR 85 | Resp 16 | Ht 67.0 in | Wt 196.2 lb

## 2024-01-16 DIAGNOSIS — Z0000 Encounter for general adult medical examination without abnormal findings: Principal | ICD-10-CM

## 2024-01-16 LAB — POCT GLYCOSYLATED HEMOGLOBIN (HGB A1C): Hemoglobin A1C POC: 6.4 % — ABNORMAL HIGH (ref 4.3–5.7)

## 2024-01-16 NOTE — Progress Notes (Signed)
 "Medicare Annual Wellness Visit    Micheal [name] is here for Medicare AWV    Assessment & Plan   Medicare annual wellness visit, subsequent  Type 2 diabetes mellitus without complication, without long-term current use of insulin (HCC)  -     HM DIABETES FOOT EXAM  -     Albumin/Creatinine Ratio, Urine; Future  Chronic bilateral low back pain with bilateral sciatica     Discussed diet  Continue Semaglutide  1 mg weekly   Try to stay active  Will call after holidays if he wants to see someone at [facility] about back  RTO in 6 mos    No follow-ups on file.     Subjective   History of Present Illness  The patient presents for evaluation of diabetes, spasms, and sciatica.    He reports no recent episodes of hypoglycemia. His appetite remains robust, and he reports no disturbances in sleep or bowel movements. He experiences occasional shortness of breath during physical activity, which he attributes to his baseline health status. He has not yet completed his cholesterol panel but plans to do so upon his return from [place] . He continues to manage his diabetes with insulin injections, which he tolerates well without any gastrointestinal side effects.    He is currently on Valium  for spasms, which he finds beneficial in alleviating his symptoms. However, he experiences significant pain upon waking, rendering him immobile for approximately 30 minutes. The medication aids in restoring his mobility. He reports no adverse effects such as constipation or dizziness from the medication. He also takes hydroxyzine  for spasms and pain, which he is willing to discontinue if necessary.    He reports experiencing sciatic pain, which has recently shifted to his other leg. The pain is triggered by certain movements and can be severe enough to necessitate lying down. He has sought medical attention for this issue and has attempted home stretching exercises, which provide temporary relief. He adheres to his prescribed medication  regimen, including Valium , which he finds helpful in managing the pain.    Sleep: Reports no disturbances in sleep  Living Condition: Lives independently, currently traveling to [place]  to stay with his sister-in-law        Patient's complete Health Risk Assessment and screening values have been reviewed and are found in Flowsheets. The following problems were reviewed today and where indicated follow up appointments were made and/or referrals ordered.    Positive Risk Factor Screenings with Interventions:          Controlled Medication Review:    Today's Pain Level: No data recorded   Opioid Risk: (Low risk score <55) Opioid risk score: 32    Patient is low risk for opioid use disorder or overdose.    Last PDMP Oneil as Reviewed:  Review User Review Instant Review Result   ANTONETTA DELON BRAVO 01/10/2024  4:18 PM     Reviewed PDMP [1]     Last Controlled Substance Monitoring Documentation      Flowsheet Row Office Visit from 01/16/2024 in [facility]   Periodic Controlled Substance Monitoring Possible medication side effects, risk of tolerance/dependence & alternative treatments discussed., No signs of potential drug abuse or diversion identified. filed at 01/16/2024 1353   All MEDD Reviewed of previous treatment and response to treatment, patients adherence to medication and non-medication treatment, Benefits and risks of the medication, including potential for addiction & risk of overdose and responsibility to safely store & appropriately  dispose of the medication filed at 01/16/2024 1353       General HRA Questions:  Select all that apply: (!) New or Increased PainInterventions - Pain:  See AVS for additional education material      Inactivity:  On average, how many days per week do you engage in moderate to strenuous exercise (like a brisk walk)?: 0 days (!) Abnormal  On average, how many minutes do you engage in exercise at this level?: 0 min  Interventions:  See AVS for additional  education material    Poor Eating Habits/Diet:  Do you eat balanced/healthy meals regularly?: (!) No  Interventions:  See AVS for additional education material    Abnormal BMI (obese):  Body mass index is 30.73 kg/m. (!) Abnormal  Interventions:  See AVS for additional education material        Dentist Screen:  Have you seen the dentist within the past year?: (!) No  Intervention:  See AVS for additional education material     Vision Screen:  Do you have difficulty driving, watching TV, or doing any of your daily activities because of your eyesight?: (!) Yes  Have you had an eye exam within the past year?: (!) No  Interventions:   See AVS for additional education material    Safety:  Do you have non-slip mats or non-slip surfaces or shower bars or grab bars in your shower or bathtub?: (!) No  Interventions:  See AVS for additional education material      Advanced Directives:  Do you have a Living Will?: (!) No  Intervention:  has NO advanced directive - information provided                     Objective   Vitals:    01/16/24 1309   BP: 112/70   Pulse: 85   Resp: 16   SpO2: 97%   Weight: 89 kg (196 lb 3.2 oz)   Height: 1.702 m (5' 7)      Body mass index is 30.73 kg/m.                  Allergies   Allergen Reactions    Corticosteroids Other (See Comments) and Shortness Of Breath     Reaction to steroid medication in 04/2023. Pt does not recall name of steroid. Resulted in tongue swelling and difficulty breathing.    Gabapentin        I don't remember     Prednisone  Swelling     Swelling of throat and tongue  Does tolerate steroid shots fine     Prior to Visit Medications   Medication Sig Taking? Authorizing Provider   oxyCODONE -acetaminophen  (PERCOCET ) 10-325 MG per tablet Take 1 tablet by mouth every 8 hours as needed for Pain for up to 30 days. Yes Antonetta Delon BRAVO, APRN - CNP   atorvastatin  (LIPITOR ) 80 MG tablet TAKE 1 TABLET BY MOUTH DAILY Yes Antonetta Delon BRAVO, APRN - CNP   isosorbide  mononitrate  (IMDUR ) 30 MG extended release tablet TAKE 1 TABLET BY MOUTH DAILY Yes Kindred Reidinger E, APRN - CNP   metFORMIN  (GLUCOPHAGE ) 1000 MG tablet TAKE 1 TABLET BY MOUTH TWICE  DAILY WITH MEALS Yes Antonetta Delon E, APRN - CNP   Semaglutide , 1 MG/DOSE, (OZEMPIC , 1 MG/DOSE,) 4 MG/3ML SOPN sc injection INJECT 1MG   SUBCUTANEOUSLY ONCE A WEEK Yes Antonetta Delon BRAVO, APRN - CNP   diazePAM  (VALIUM ) 5 MG tablet Take 1 tablet by mouth every 8  hours as needed for Anxiety for up to 90 days. Max Daily Amount: 15 mg Yes Kari Kerth E, APRN - CNP   fluticasone  (FLONASE ) 50 MCG/ACT nasal spray Use 2 spray(s) in each nostril once daily Yes Antonetta Delon BRAVO, APRN - CNP   albuterol  (PROVENTIL ) (2.5 MG/3ML) 0.083% nebulizer solution USE 1 VIAL IN NEBULIZER 4 TIMES DAILY AS NEEDED FOR WHEEZING Yes Antonetta Delon BRAVO, APRN - CNP   pregabalin  (LYRICA ) 150 MG capsule Take 1 capsule by mouth in the morning, at noon, and at bedtime for 180 days. Take 1 capsule by mouth in the morning, at noon, and at bedtime for 180 days. Max Daily Amount: 450 mg Yes Antonetta Delon BRAVO, APRN - CNP   Aspirin  81 MG CAPS Take by mouth Yes [provider]   ondansetron  (ZOFRAN -ODT) 4 MG disintegrating tablet DISSOLVE 1 TABLET IN MOUTH THREE TIMES DAILY AS NEEDED FOR NAUSEA AND/OR FOR VOMITING Yes Antonetta Delon BRAVO, APRN - CNP   nitroGLYCERIN  (NITROSTAT ) 0.4 MG SL tablet Place 1 tablet under the tongue every 5 minutes as needed for Chest pain Yes Antonetta Delon BRAVO, APRN - CNP   fenofibrate  (TRICOR ) 145 MG tablet Take 1 tablet by mouth daily Yes Antonetta Delon BRAVO, APRN - CNP   albuterol  sulfate HFA (PROVENTIL ;VENTOLIN ;PROAIR ) 108 (90 Base) MCG/ACT inhaler Inhale 2 puffs into the lungs every 6 hours as needed for Wheezing Yes Antonetta Delon BRAVO, APRN - CNP   PARoxetine  (PAXIL ) 40 MG tablet Take 1 tablet by mouth daily Yes Pingle, Valerie R, APRN - CNP   ticagrelor  (BRILINTA ) 90 MG TABS tablet Take 1 tablet by mouth twice daily Yes  Pingle, Berwyn SAUNDERS, APRN - CNP   esomeprazole  (NEXIUM ) 40 MG delayed release capsule TAKE 1 CAPSULE BY MOUTH ONCE DAILY IN THE MORNING BEFORE BREAKFAST Yes Pingle, Valerie R, APRN - CNP   tamsulosin  (FLOMAX ) 0.4 MG capsule Take 1 capsule by mouth 2 times daily Yes Pingle, Berwyn SAUNDERS, APRN - CNP   metoprolol  succinate (TOPROL  XL) 25 MG extended release tablet Take 1 tablet by mouth daily Yes Pingle, Valerie R, APRN - CNP   lisinopril  (PRINIVIL ;ZESTRIL ) 40 MG tablet Take 1 tablet by mouth daily Yes Pingle, Berwyn SAUNDERS, APRN - CNP   cetirizine  (ZYRTEC ) 10 MG tablet Take 1 tablet by mouth daily Yes Antonetta Delon BRAVO, APRN - CNP   SYMBICORT  80-4.5 MCG/ACT AERO INHALE 2 PUFFS INTO THE LUNGS 2 TIMES DAILY Yes Antonetta Delon E, APRN - CNP   hydrOXYzine  HCl (ATARAX ) 25 MG tablet TAKE 1 TABLET BY MOUTH EVERY 8 HOURS AS NEEDED FOR PAIN AND  SPASMING.  TAKE  WITH  PAIN  MEDICATION Yes Antonetta Delon BRAVO, APRN - CNP   olopatadine  (PATANOL) 0.1 % ophthalmic solution Place 1 drop into both eyes 2 times daily Yes Antonetta Delon BRAVO, APRN - CNP   montelukast  (SINGULAIR ) 10 MG tablet Take 1 tablet by mouth nightly Yes Antonetta Delon BRAVO, APRN - CNP   glimepiride  (AMARYL ) 2 MG tablet Take 1 tablet by mouth twice daily Yes Antonetta Delon BRAVO, APRN - CNP   hydrocortisone  2.5 % cream Apply to external ear BID PRN Yes Antonetta Delon BRAVO, APRN - CNP   blood glucose monitor kit and supplies Dispense sufficient amount for indicated testing frequency plus additional to accommodate PRN testing needs. Dispense all needed supplies to include: monitor, strips, lancing device, lancets, control solutions, alcohol swabs. Yes Antonetta Delon BRAVO, APRN - CNP   blood glucose monitor strips Test 1  times a day & as needed for symptoms of irregular blood glucose. Dispense sufficient amount for indicated testing frequency plus additional to accommodate PRN testing needs. Yes Antonetta Delon BRAVO, APRN - CNP   pioglitazone  (ACTOS ) 15 MG tablet Take 1  tablet by mouth once daily Yes Lynne Oneil BIRCH, DO   Handicap Placard MISC by Does not apply route Expires 06/30/25 Yes Kahle, Mark D, DO   Lancets (ONETOUCH DELICA PLUS LANCET33G) MISC USE 1  TO CHECK GLUCOSE ONCE DAILY  Pingle, Valerie R, APRN - CNP   NARCAN  4 MG/0.1ML LIQD nasal spray ADMINISTER A SINGLE SPRAY INTRANASALLY INTO ONE NOSTRIL. CALL 911. MAY REPEAT X 1.  Patient taking differently: as needed  [provider]       CareTeam (Including outside providers/suppliers regularly involved in providing care):   Patient Care Team:  Antonetta Delon BRAVO, APRN - CNP as PCP - General (Nurse Practitioner, Family)  Antonetta Delon BRAVO, APRN - CNP as PCP - Empaneled Provider     Recommendations for Preventive Services Due: see orders and patient instructions/AVS.  Recommended screening schedule for the next 5-10 years is provided to the patient in written form: see Patient Instructions/AVS.     Reviewed and updated this visit:  Allergies  Meds               "

## 2024-01-16 NOTE — Patient Instructions (Signed)
 "     Learning About Being Active as an Older Adult  Why is being active important as you get older?     Being active is one of the best things you can do for your health. And it's never too late to start. Being active--or getting active, if you aren't already--has definite benefits. It can:  Give you more energy,  Keep your mind sharp.  Improve balance to reduce your risk of falls.  Help you manage chronic illness with fewer medicines.  No matter how old you are, how fit you are, or what health problems you have, there is a form of activity that will work for you. And the more physical activity you can do, the better your overall health will be.  What kinds of activity can help you stay healthy?  Being more active will make your daily activities easier. Physical activity includes planned exercise and things you do in daily life. There are four types of activity:  Aerobic.  Doing aerobic activity makes your heart and lungs strong.  Includes walking, dancing, and gardening.  Aim for at least 2 hours spread throughout the week.  It improves your energy and can help you sleep better.  Muscle-strengthening.  This type of activity can help maintain muscle and strengthen bones.  Includes climbing stairs, using resistance bands, and lifting or carrying heavy loads.  Aim for at least twice a week.  It can help protect the knees and other joints.  Stretching.  Stretching gives you better range of motion in joints and muscles.  Includes upper arm stretches, calf stretches, and gentle yoga.  Aim for at least twice a week, preferably after your muscles are warmed up from other activities.  It can help you function better in daily life.  Balancing.  This helps you stay coordinated and have good posture.  Includes heel-to-toe walking, tai chi, and certain types of yoga.  Aim for at least 3 days a week.  It can reduce your risk of falling.  Even if you have a hard time meeting the recommendations, it's better to be more active  than less active. All activity done in each category counts toward your weekly total. You'd be surprised how daily things like carrying groceries, keeping up with grandchildren, and taking the stairs can add up.  What keeps you from being active?  If you've had a hard time being more active, you're not alone. Maybe you remember being able to do more. Or maybe you've never thought of yourself as being active. It's frustrating when you can't do the things you want. Being more active can help. What's holding you back?  Getting started.  Have a goal, but break it into easy tasks. Small steps build into big accomplishments.  Staying motivated.  If you feel like skipping your activity, remember your goal. Maybe you want to move better and stay independent. Every activity gets you one step closer.  Not feeling your best.  Start with 5 minutes of an activity you enjoy. Prove to yourself you can do it. As you get comfortable, increase your time.  You may not be where you want to be. But you're in the process of getting there. Everyone starts somewhere.  How can you find safe ways to stay active?  Talk with your doctor about any physical challenges you're facing. Make a plan with your doctor if you have a health problem or aren't sure how to get started with activity.  If you're already  active, ask your doctor if there is anything you should change to stay safe as your body and health change.  If you tend to feel dizzy after you take medicine, avoid activity at that time. Try being active before you take your medicine. This will reduce your risk of falls.  If you plan to be active at home, make sure to clear your space before you get started. Remove things like TV cords, coffee tables, and throw rugs. It's safest to have plenty of space to move freely.  The key to getting more active is to take it slow and steady. Try to improve only a little bit at a time. Pick just one area to improve on at first. And if an activity hurts,  stop and talk to your doctor.  Where can you learn more?  Go to [employer] and enter P600 to learn more about Learning About Being Active as an Older Adult.  Current as of: August 24, 2022  Content Version: 14.6   2024-2025 [place], [place].   Care instructions adapted under license by [facility]. If you have questions about a medical condition or this instruction, always ask your healthcare professional. Romayne Alderman, [facility], disclaims any warranty or liability for your use of this information.         Learning About Dental Care for Older Adults  Dental care for older adults: Overview  Dental care for older people is much the same as for younger adults. But older adults do have concerns that younger adults do not. Older adults may have problems with gum disease and decay on the roots of their teeth. They may need missing teeth replaced or broken fillings fixed. Or they may have dentures that need to be cared for. Some older adults may have trouble holding a toothbrush.  You can help remind the person you are caring for to brush and floss their teeth or to clean their dentures. In some cases, you may need to do the brushing and other dental care tasks. People who have trouble using their hands or who have dementia may need this extra help.  How can you help with dental care?  Normal dental care  To keep the teeth and gums healthy:  Brush the teeth with fluoride toothpaste twice a day--in the morning and at night--and floss at least once a day. Plaque can quickly build up on the teeth of older adults.  Watch for the signs of gum disease. These signs include gums that bleed after brushing or after eating hard foods, such as apples.  See a dentist regularly. Many experts recommend checkups every 6 months.  Keep the dentist up to date on any new medications the person is taking.  Encourage a balanced diet that includes whole grains, vegetables, and fruits, and that is low in  saturated fat and sodium.  Encourage the person you're caring for not to use tobacco products. They can affect dental and general health.  Many older adults have a fixed income and feel that they can't afford dental care. But most towns and cities have programs in which dentists help older adults by lowering fees. Contact your area's public health offices or social services for information about dental care in your area.  Using a toothbrush  Older adults with arthritis sometimes have trouble brushing their teeth because they can't easily hold the toothbrush. Their hands and fingers may be stiff, painful, or weak. If this is the case, you can:  Offer an mining engineer toothbrush.  Enlarge the handle of a non-electric toothbrush by wrapping a sponge, an elastic bandage, or adhesive tape around it.  Push the toothbrush handle through a ball made of rubber or soft foam.  Make the handle longer and thicker by taping Popsicle sticks or tongue depressors to it.  You may also be able to buy special toothbrushes, toothpaste dispensers, and floss holders.  Your doctor may recommend a soft-bristle toothbrush if the person you care for bleeds easily. Bleeding can happen because of a health problem or from certain medicines.  A toothpaste for sensitive teeth may help if the person you care for has sensitive teeth.  How do you brush and floss someone's teeth?  If the person you are caring for has a hard time cleaning their teeth on their own, you may need to brush and floss their teeth for them. It may be easiest to have the person sit and face away from you, and to sit or stand behind them. That way you can steady their head against your arm as you reach around to floss and brush their teeth. Choose a place that has good lighting and is comfortable for both of you.  Before you begin, gather your supplies. You will need gloves, floss, a toothbrush, and a container to hold water if you are not near a sink. Wash and dry your hands well  and put on gloves. Start by flossing:  Gently work a piece of floss between each of the teeth toward the gums. A plastic flossing tool may make this easier, and they are available at most drugstores.  Curve the floss around each tooth into a U-shape and gently slide it under the gum line.  Move the floss firmly up and down several times to scrape off the plaque.  After you've finished flossing, throw away the used floss and begin brushing:  Wet the brush and apply toothpaste.  Place the brush at a 45-degree angle where the teeth meet the gums. Press firmly, and move the brush in small circles over the surface of the teeth.  Be careful not to brush too hard. Vigorous brushing can make the gums pull away from the teeth and can scratch the tooth enamel.  Brush all surfaces of the teeth, on the tongue side and on the cheek side. Pay special attention to the front teeth and all surfaces of the back teeth.  Brush chewing surfaces with short back-and-forth strokes.  After you've finished, help the person rinse the remaining toothpaste from their mouth.  Where can you learn more?  Go to [employer] and enter F944 to learn more about Learning About Dental Care for Older Adults.  Current as of: August 24, 2022  Content Version: 14.6   2024-2025 [place], [place].   Care instructions adapted under license by [facility]. If you have questions about a medical condition or this instruction, always ask your healthcare professional. Romayne Alderman, [facility], disclaims any warranty or liability for your use of this information.         Learning About Vision Tests  What are vision tests?     The four most common vision tests are visual acuity tests, refraction, visual field tests, and color vision tests.  Visual acuity (sharpness) tests  These tests are used:  To see if you need glasses or contact lenses.  To monitor an eye problem.  To check an eye injury.  Visual acuity tests are done as part of  routine exams. You may also have  this test when you get your driver's license or apply for some types of jobs.  Visual field tests  These tests are used:  To check for vision loss in any area of your range of vision.  To screen for certain eye diseases.  To look for nerve damage after a stroke, head injury, or other problem that could reduce blood flow to the brain.  Refraction and color tests  A refraction test is done to find the right prescription for glasses and contact lenses.  A color vision test is done to check for color blindness.  Color vision is often tested as part of a routine exam. You may also have this test when you apply for a job where recognizing different colors is important, such as truck driving, optician, dispensing, or the [employer].  How are vision tests done?  Visual acuity test   You cover one eye at a time.  You read aloud from a wall chart across the room.  You read aloud from a small card that you hold in your hand.  Refraction   You look into a special device.  The device puts lenses of different strengths in front of each eye to see how strong your glasses or contact lenses need to be.  Visual field tests   Your doctor may have you look through special machines.  Or your doctor may simply have you stare straight ahead while they move a finger into and out of your field of vision.  Color vision test   You look at pieces of printed test patterns in various colors. You say what number or symbol you see.  Your doctor may have you trace the number or symbol using a pointer.  How do these tests feel?  There is very little chance of having a problem from this test. If dilating drops are used for a vision test, they may make the eyes sting and cause a medicine taste in the mouth.  Follow-up care is a key part of your treatment and safety. Be sure to make and go to all appointments, and call your doctor if you are having problems. It's also a good idea to know your test results and keep a list of the  medicines you take.  Where can you learn more?  Go to [employer] and enter G551 to learn more about Learning About Vision Tests.  Current as of: August 24, 2022  Content Version: 14.6   2024-2025 [place], [place].   Care instructions adapted under license by [facility]. If you have questions about a medical condition or this instruction, always ask your healthcare professional. Romayne Alderman, [facility], disclaims any warranty or liability for your use of this information.         Eating Healthy Foods: Care Instructions  With every meal, you can make healthy food choices. Try to eat a variety of fruits, vegetables, whole grains, lean proteins, and low-fat dairy products. This can help you get the right balance of nutrients, including vitamins and minerals. Small changes add up over time. You can start by adding one healthy food to your meals each day.    Try to make half your plate fruits and vegetables, one-fourth whole grains, and one-fourth lean proteins. Try including dairy with your meals.   Eat more fruits and vegetables. Try to have them with most meals and snacks.   Foods for healthy eating        Fruits   These can be fresh, frozen, canned,  or dried.  Try to choose whole fruit rather than fruit juice.  Eat a variety of colors.        Vegetables   These can be fresh, frozen, canned, or dried.  Beans, peas, and lentils count too.        Whole grains   Choose whole-grain breads, cereals, and noodles.  Try brown rice.        Lean proteins   These can include lean meat, poultry, fish, and eggs.  You can also have tofu, beans, peas, lentils, nuts, and seeds.        Dairy   Try milk, yogurt, and cheese.  Choose low-fat or fat-free when you can.  If you need to, use lactose-free milk or fortified plant-based milk products, such as soy milk.        Water   Drink water when you're thirsty.  Limit sugar-sweetened drinks, including soda, fruit drinks, and sports drinks.  Where can you  learn more?  Go to [employer] and enter T756 to learn more about Eating Healthy Foods: Care Instructions.  Current as of: October 31, 2022  Content Version: 14.6   2024-2025 [place], [place].   Care instructions adapted under license by [facility]. If you have questions about a medical condition or this instruction, always ask your healthcare professional. Romayne Alderman, [facility], disclaims any warranty or liability for your use of this information.         Starting a Weight-Loss Plan: Care Instructions  Overview    It can be a challenge to lose weight. But your doctor can help you make a weight-loss plan that meets your needs.  You don't have to make a lot of big changes at once. A better idea might be to focus on small changes and stick with them. When those changes become habit, you can add a few more changes.  Some people find it helpful to take an exercise or nutrition class. If you have questions, ask your doctor about seeing a registered dietitian or an exercise specialist. You might also think about joining a weight-loss support group.  If you're not ready to make changes right now, try to pick a date in the future. Then make an appointment with your doctor to talk about when and how you'll get started with a plan.  Follow-up care is a key part of your treatment and safety. Be sure to make and go to all appointments, and call your doctor if you are having problems. It's also a good idea to know your test results and keep a list of the medicines you take.  How can you care for yourself as you start a weight-loss plan?   Set realistic goals. Many people expect to lose much more weight than is likely. A weight loss of 5% to 10% of your body weight may be enough to improve your health.  Get family and friends involved to provide support. Talk to them about why you are trying to lose weight, and ask them to help. They can help by participating in exercise and having meals with  you, even if they may be eating something different.  Find what works best for you. If you do not have time or do not like to cook, a program that offers meal replacement bars or shakes may be better for you. Or if you like to prepare meals, finding a plan that includes daily menus and recipes may be best.  Ask your doctor about other health professionals  who can help you achieve your weight-loss goals.  A dietitian can help you make healthy changes in your diet.  An exercise specialist or personal trainer can help you develop a safe and effective exercise program.  A counselor or psychiatrist can help you cope with issues such as depression, anxiety, or family problems that can make it hard to focus on weight loss.  Consider joining a support group for people who are trying to lose weight. Your doctor can suggest groups in your area.  Where can you learn more?  Go to [employer] and enter U357 to learn more about Starting a Weight-Loss Plan: Care Instructions.  Current as of: March 25, 2023  Content Version: 14.6   2024-2025 [place], [place].   Care instructions adapted under license by [facility]. If you have questions about a medical condition or this instruction, always ask your healthcare professional. Romayne Alderman, [facility], disclaims any warranty or liability for your use of this information.         Advance Directives: Care Instructions  Overview  An advance directive is a legal way to state your wishes at the end of your life. It tells your loved ones and doctor what to do if you can't say what you want.  There are two main types of advance directives. You can change them any time your wishes change.  Living will. This form tells your loved ones and doctor your wishes about life support and other treatment. The form is also called a declaration.  Medical power of attorney. This form lets you name a person to make treatment decisions for you when you can't speak for  yourself. This person is called a health care agent (health care proxy, health care surrogate). The form is also called a durable power of attorney for health care.  If you do not have an advance directive, decisions about your medical care may be made by a family member or doctor who doesn't know you or by a judge.  It may help to think of an advance directive as a gift to the people who care for you. If you have one, they won't have to make tough decisions by themselves.  For more information, including forms for your state, see the [employer] website (plumber[employer].com.cy).  Follow-up care is a key part of your treatment and safety. Be sure to make and go to all appointments, and call your doctor if you are having problems. It's also a good idea to know your test results and keep a list of the medicines you take.  What should you include in an advance directive?  Many states have a unique advance directive form. (It may ask you to address specific issues.) Or you might use a universal form that's approved by many states.  If your form doesn't tell you what to address, it may be hard to know what to include in your advance directive. Use the questions below to help you get started.  Who do you want to make decisions about your medical care if you are not able to?  What life-support measures do you want if you have a serious illness that gets worse over time or can't be cured?  What are you most afraid of that might happen? (Maybe you're afraid of having pain, losing your independence, or being kept alive by machines.)  Where would you prefer to die? (Your home? A hospital? A nursing home?)  Do you want to donate your organs when you die?  Do you want certain religious practices performed before you die?  When should you call for help?  Be sure to contact your doctor if you have any questions.  Where can you learn more?  Go to [employer] and enter R264 to learn  more about Advance Directives: Care Instructions.  Current as of: July 25, 2023  Content Version: 14.6   2024-2025 [place], [place].   Care instructions adapted under license by [facility]. If you have questions about a medical condition or this instruction, always ask your healthcare professional. Romayne Alderman, [facility], disclaims any warranty or liability for your use of this information.         A Healthy Heart: Care Instructions  Overview    Coronary artery disease, also called heart disease, occurs when a substance called plaque builds up in the vessels that supply oxygen-rich blood to your heart muscle. This can narrow the blood vessels and reduce blood flow. A heart attack happens when blood flow is completely blocked. A high-fat diet, smoking, and other factors increase the risk of heart disease.  Your doctor has found that you have a chance of having heart disease. A heart-healthy lifestyle can help keep your heart healthy and prevent heart disease. This lifestyle includes eating healthy, being active, staying at a weight that's healthy for you, and not smoking, vaping, or using other tobacco or nicotine products. It also includes taking medicines as directed, managing other health conditions, and trying to get a healthy amount of sleep.  Follow-up care is a key part of your treatment and safety. Be sure to make and go to all appointments, and contact your doctor if you are having problems. It's also a good idea to know your test results and keep a list of the medicines you take.  How can you care for yourself at home?  Diet  Use less salt when you cook and eat. This helps lower your blood pressure. Taste food before salting. Add only a little salt when you think you need it. With time, your taste buds will adjust to less salt.  Eat fewer snack items, fast foods, canned soups, and other high-salt, high-fat, processed foods.  Read food labels and try to avoid saturated and trans fats. They increase  your risk of heart disease by raising cholesterol levels.  Limit the amount of solid fat--butter, margarine, and shortening--you eat. Use olive, peanut, or canola oil when you cook. Bake, broil, and steam foods instead of frying them.  Eat a variety of fruit and vegetables every day. Dark green, deep orange, red, or yellow fruits and vegetables are especially good for you. Examples include spinach, carrots, peaches, and berries.  Foods high in fiber can reduce your cholesterol and provide important vitamins and minerals. High-fiber foods include whole-grain cereals and breads, oatmeal, beans, brown rice, citrus fruits, and apples.  Eat lean proteins. Heart-healthy proteins include seafood, lean meats and poultry, eggs, beans, peas, nuts, seeds, and soy products.  Limit drinks and foods with added sugar. These include candy, desserts, and soda pop.  Heart-healthy lifestyle  If your doctor recommends it, get more exercise. For many people, walking is a good choice. Or you may want to swim, bike, or do other activities. Bit by bit, increase the time you're active every day. Try for at least 30 minutes on most days of the week.  If you smoke, vape, or use other tobacco or nicotine products, try to quit. If you cant quit, cut back as much as  you can. If you need help quitting, talk to your doctor about quit programs and medicines. Quitting is one of the most important things you can do to protect your heart. Also avoid secondhand smoke and the aerosol mist from vaping.  Stay at a weight that's healthy for you. Talk to your doctor if you need help losing weight.  Try to get 7 to 9 hours of sleep each night.  Limit alcohol to 2 drinks a day for men and 1 drink a day for women. Too much alcohol can cause health problems.  Manage other health problems such as diabetes, high blood pressure, and high cholesterol. If you think you may have a problem with alcohol or drug use, talk to your doctor.  Medicines  Take your medicines  exactly as prescribed. Contact your doctor if you think you are having a problem with your medicine.  When should you call for help?  Call 911 if you have symptoms of a heart attack. These may include:  Chest pain or pressure, or a strange feeling in the chest.  Sweating.  Shortness of breath.  Pain, pressure, or a strange feeling in the back, neck, jaw, or upper belly or in one or both shoulders or arms.  Lightheadedness or sudden weakness.  A fast or irregular heartbeat.  After you call 911, the operator may tell you to chew 1 adult-strength or 2 to 4 low-dose aspirin . Wait for an ambulance. Do not try to drive yourself.  Watch closely for changes in your health, and be sure to contact your doctor if you have any problems.  Where can you learn more?  Go to [email or web address] and enter F075 to learn more about [facility] Healthy [facility]: Care Instructions.  Current as of: August 24, 2022  Content Version: 14.6   2024-2025 [place], [place].   Care instructions adapted under license by [facility]. If you have questions about a medical condition or this instruction, always ask your healthcare professional. Romayne Alderman, [facility], disclaims any warranty or liability for your use of this information.    Personalized Preventive Plan for Micheal [name] - 01/16/2024  Medicare offers a range of preventive health benefits. Some of the tests and screenings are paid in full while other may be subject to a deductible, co-insurance, and/or copay.  Some of these benefits include a comprehensive review of your medical history including lifestyle, illnesses that may run in your family, and various assessments and screenings as appropriate.  After reviewing your medical record and screening and assessments performed today your provider may have ordered immunizations, labs, imaging, and/or referrals for you.  A list of these orders (if applicable) as well as your Preventive Care list are included within your After  Visit Summary for your review.      "

## 2024-01-17 LAB — ALBUMIN/CREATININE RATIO, URINE
Albumin Urine: 2.3 mg/dL
Albumin/Creatinine Ratio: 15 mg/g (ref 0–30)
Creatinine, Ur: 151 mg/dL

## 2024-01-17 NOTE — Telephone Encounter (Signed)
"  Notified patient . Verbalized understanding. No further concerns voiced.   "

## 2024-01-17 NOTE — Telephone Encounter (Signed)
"  Antonetta Delon BRAVO, APRN - CNP to [facility] Clinical Staff (Selected Message)      01/17/24 11:07 AM  Result Note  Urine testing was stable.   Albumin/Creatinine Ratio, Urine  "

## 2024-02-08 ENCOUNTER — Encounter

## 2024-02-08 MED ORDER — OXYCODONE-ACETAMINOPHEN 10-325 MG PO TABS
10-325 | ORAL_TABLET | Freq: Three times a day (TID) | ORAL | 0 refills | 7.00000 days | Status: AC | PRN
Start: 2024-02-08 — End: 2024-03-09

## 2024-02-08 NOTE — Telephone Encounter (Signed)
"  Patient contacted the office and requested a refill of his Percocet  to be sent to the pharmacy on file.     Please review.     Recent Visits  Date Type Provider Dept   01/16/24 Office Visit Antonetta Delon BRAVO, APRN - CNP [facility]   08/15/23 Office Visit Antonetta Delon BRAVO, APRN - CNP [facility]   07/14/23 Office Visit Antonetta Delon BRAVO, APRN - CNP [facility]   07/11/23 Office Visit Antonetta Delon BRAVO, APRN - CNP [facility]   06/29/23 Office Visit Antonetta Delon BRAVO, APRN - CNP [facility]   05/11/23 Office Visit Antonetta Delon BRAVO, APRN - CNP [facility]   04/06/23 Office Visit Antonetta Delon BRAVO, APRN - CNP [facility]   12/07/22 Office Visit Antonetta Delon BRAVO, APRN - CNP [facility]   Showing recent visits within past 540 days with a meds authorizing provider and meeting all other requirements  Future Appointments  No visits were found meeting these conditions.  Showing future appointments within next 150 days with a meds authorizing provider and meeting all other requirements      "

## 2024-02-17 ENCOUNTER — Encounter

## 2024-02-19 MED ORDER — OZEMPIC (1 MG/DOSE) 4 MG/3ML SC SOPN
4 | SUBCUTANEOUS | 2 refills | Status: AC
Start: 2024-02-19 — End: ?

## 2024-02-19 NOTE — Telephone Encounter (Signed)
 Recent Visits  Date Type Provider Dept   01/16/24 Office Visit Antonetta Delon BRAVO, APRN - CNP [facility]   08/15/23 Office Visit Antonetta Delon BRAVO, APRN - CNP [facility]   07/14/23 Office Visit Antonetta Delon BRAVO, APRN - CNP [facility]   07/11/23 Office Visit Antonetta Delon BRAVO, APRN - CNP [facility]   06/29/23 Office Visit Antonetta Delon BRAVO, APRN - CNP [facility]   05/11/23 Office Visit Antonetta Delon BRAVO, APRN - CNP [facility]   04/06/23 Office Visit Antonetta Delon BRAVO, APRN - CNP [facility]   12/07/22 Office Visit Antonetta Delon BRAVO, APRN - CNP [facility]   Showing recent visits within past 540 days with a meds authorizing provider and meeting all other requirements  Future Appointments  Date Type Provider Dept   07/16/24 Appointment Antonetta Delon BRAVO, APRN - CNP [facility]   Showing future appointments within next 150 days with a meds authorizing provider and meeting all other requirements
